# Patient Record
Sex: Male | Born: 1965
Health system: Southern US, Community
[De-identification: ages and names within clinical notes are randomized; demographics above are authoritative.]

## PROBLEM LIST (undated history)

## (undated) DIAGNOSIS — F32A Depression, unspecified: Secondary | ICD-10-CM

## (undated) DIAGNOSIS — E162 Hypoglycemia, unspecified: Secondary | ICD-10-CM

## (undated) DIAGNOSIS — F329 Major depressive disorder, single episode, unspecified: Secondary | ICD-10-CM

## (undated) DIAGNOSIS — I10 Essential (primary) hypertension: Secondary | ICD-10-CM

## (undated) DIAGNOSIS — B20 Human immunodeficiency virus [HIV] disease: Secondary | ICD-10-CM

## (undated) DIAGNOSIS — Z21 Asymptomatic human immunodeficiency virus [HIV] infection status: Secondary | ICD-10-CM

## (undated) DIAGNOSIS — F419 Anxiety disorder, unspecified: Secondary | ICD-10-CM

## (undated) DIAGNOSIS — J45909 Unspecified asthma, uncomplicated: Secondary | ICD-10-CM

## (undated) HISTORY — DX: Unspecified asthma, uncomplicated: J45.909

## (undated) HISTORY — PX: HERNIA REPAIR: SHX51

---

## 1994-12-11 DIAGNOSIS — J45909 Unspecified asthma, uncomplicated: Secondary | ICD-10-CM | POA: Insufficient documentation

## 2004-11-19 ENCOUNTER — Emergency Department (HOSPITAL_COMMUNITY): Admission: EM | Admit: 2004-11-19 | Discharge: 2004-11-19 | Payer: Self-pay | Admitting: Emergency Medicine

## 2005-05-01 ENCOUNTER — Encounter: Admission: RE | Admit: 2005-05-01 | Discharge: 2005-05-01 | Payer: Self-pay | Admitting: Infectious Diseases

## 2005-05-01 ENCOUNTER — Ambulatory Visit: Payer: Self-pay | Admitting: Infectious Diseases

## 2005-05-01 ENCOUNTER — Encounter (INDEPENDENT_AMBULATORY_CARE_PROVIDER_SITE_OTHER): Payer: Self-pay | Admitting: *Deleted

## 2005-05-01 LAB — CONVERTED CEMR LAB: CD4 T Cell Abs: 410

## 2005-06-03 ENCOUNTER — Encounter: Admission: RE | Admit: 2005-06-03 | Discharge: 2005-06-03 | Payer: Self-pay | Admitting: Infectious Diseases

## 2005-06-03 ENCOUNTER — Ambulatory Visit: Payer: Self-pay | Admitting: Infectious Diseases

## 2005-06-29 ENCOUNTER — Ambulatory Visit: Payer: Self-pay | Admitting: Infectious Diseases

## 2005-08-05 ENCOUNTER — Ambulatory Visit: Payer: Self-pay | Admitting: Internal Medicine

## 2005-08-26 ENCOUNTER — Encounter: Admission: RE | Admit: 2005-08-26 | Discharge: 2005-08-26 | Payer: Self-pay | Admitting: Infectious Diseases

## 2005-08-26 ENCOUNTER — Ambulatory Visit: Payer: Self-pay | Admitting: Infectious Diseases

## 2005-08-26 ENCOUNTER — Encounter (INDEPENDENT_AMBULATORY_CARE_PROVIDER_SITE_OTHER): Payer: Self-pay | Admitting: *Deleted

## 2005-08-26 LAB — CONVERTED CEMR LAB: CD4 Count: 440 microliters

## 2005-11-16 ENCOUNTER — Encounter: Admission: RE | Admit: 2005-11-16 | Discharge: 2005-11-16 | Payer: Self-pay | Admitting: Infectious Diseases

## 2005-11-16 ENCOUNTER — Ambulatory Visit: Payer: Self-pay | Admitting: Infectious Diseases

## 2005-11-16 ENCOUNTER — Encounter (INDEPENDENT_AMBULATORY_CARE_PROVIDER_SITE_OTHER): Payer: Self-pay | Admitting: *Deleted

## 2005-11-16 LAB — CONVERTED CEMR LAB
CD4 Count: 370 microliters
HIV 1 RNA Quant: 49 copies/mL
HIV 1 RNA Quant: <50 copies/mL (ref <50)

## 2005-11-19 DIAGNOSIS — K625 Hemorrhage of anus and rectum: Secondary | ICD-10-CM

## 2006-02-23 ENCOUNTER — Encounter: Admission: RE | Admit: 2006-02-23 | Discharge: 2006-02-23 | Payer: Self-pay | Admitting: Infectious Diseases

## 2006-02-23 ENCOUNTER — Ambulatory Visit: Payer: Self-pay | Admitting: Infectious Diseases

## 2006-02-23 ENCOUNTER — Encounter (INDEPENDENT_AMBULATORY_CARE_PROVIDER_SITE_OTHER): Payer: Self-pay | Admitting: *Deleted

## 2006-02-23 LAB — CONVERTED CEMR LAB
Albumin: 4.6 g/dL (ref 3.5–5.2)
CO2: 28 meq/L (ref 19–32)
Cholesterol: 182 mg/dL (ref 0–200)
Glucose, Bld: 71 mg/dL (ref 70–99)
HIV 1 RNA Quant: 49 copies/mL
HIV-1 RNA Quant, Log: <1.7 (ref <1.70)
LDL Cholesterol: 91 mg/dL (ref 0–99)
Potassium: 3.9 meq/L (ref 3.5–5.3)
RBC: 4.01 M/uL — ABNORMAL LOW (ref 4.22–5.81)
Sodium: 140 meq/L (ref 135–145)
Total Protein: 8.4 g/dL — ABNORMAL HIGH (ref 6.0–8.3)
Triglycerides: 281 mg/dL — ABNORMAL HIGH (ref <150)
WBC: 3.2 10*3/uL — ABNORMAL LOW (ref 4.0–10.5)

## 2006-02-24 DIAGNOSIS — E785 Hyperlipidemia, unspecified: Secondary | ICD-10-CM

## 2006-02-24 DIAGNOSIS — B20 Human immunodeficiency virus [HIV] disease: Secondary | ICD-10-CM | POA: Insufficient documentation

## 2006-02-24 DIAGNOSIS — Z8619 Personal history of other infectious and parasitic diseases: Secondary | ICD-10-CM

## 2006-03-08 ENCOUNTER — Encounter: Payer: Self-pay | Admitting: Infectious Diseases

## 2006-04-05 ENCOUNTER — Telehealth: Payer: Self-pay | Admitting: Infectious Diseases

## 2006-04-05 ENCOUNTER — Encounter (INDEPENDENT_AMBULATORY_CARE_PROVIDER_SITE_OTHER): Payer: Self-pay | Admitting: *Deleted

## 2006-04-05 LAB — CONVERTED CEMR LAB

## 2006-04-13 ENCOUNTER — Telehealth: Payer: Self-pay | Admitting: Infectious Diseases

## 2006-04-18 ENCOUNTER — Encounter (INDEPENDENT_AMBULATORY_CARE_PROVIDER_SITE_OTHER): Payer: Self-pay | Admitting: *Deleted

## 2006-04-20 ENCOUNTER — Encounter (INDEPENDENT_AMBULATORY_CARE_PROVIDER_SITE_OTHER): Payer: Self-pay | Admitting: *Deleted

## 2006-05-05 ENCOUNTER — Telehealth: Payer: Self-pay | Admitting: Infectious Diseases

## 2006-06-01 ENCOUNTER — Telehealth: Payer: Self-pay | Admitting: Infectious Diseases

## 2006-07-02 ENCOUNTER — Telehealth: Payer: Self-pay | Admitting: Infectious Diseases

## 2006-07-06 ENCOUNTER — Ambulatory Visit: Payer: Self-pay | Admitting: Infectious Diseases

## 2006-07-06 ENCOUNTER — Encounter: Admission: RE | Admit: 2006-07-06 | Discharge: 2006-07-06 | Payer: Self-pay | Admitting: Infectious Diseases

## 2006-07-06 DIAGNOSIS — R599 Enlarged lymph nodes, unspecified: Secondary | ICD-10-CM | POA: Insufficient documentation

## 2006-07-06 LAB — CONVERTED CEMR LAB
ALT: 12 units/L (ref 0–53)
AST: 13 units/L (ref 0–37)
CO2: 26 meq/L (ref 19–32)
Calcium: 9.4 mg/dL (ref 8.4–10.5)
Chloride: 105 meq/L (ref 96–112)
Cholesterol: 180 mg/dL (ref 0–200)
HIV 1 RNA Quant: 240 copies/mL — ABNORMAL HIGH (ref <50)
Leukocytes, UA: NEGATIVE
Nitrite: NEGATIVE
Platelets: 231 10*3/uL (ref 150–400)
Protein, ur: NEGATIVE mg/dL
RDW: 13.7 % (ref 11.5–14.0)
Sodium: 142 meq/L (ref 135–145)
Total Protein: 8.2 g/dL (ref 6.0–8.3)
Urine Glucose: NEGATIVE mg/dL
VLDL: 32 mg/dL (ref 0–40)
WBC, UA: NONE SEEN cells/hpf (ref <3)
WBC: 4 10*3/uL (ref 4.0–10.5)
pH: 6 (ref 5.0–8.0)

## 2006-07-30 ENCOUNTER — Telehealth: Payer: Self-pay | Admitting: Infectious Diseases

## 2006-08-31 ENCOUNTER — Telehealth: Payer: Self-pay | Admitting: Infectious Diseases

## 2006-09-02 ENCOUNTER — Telehealth: Payer: Self-pay | Admitting: Infectious Diseases

## 2006-09-02 ENCOUNTER — Encounter (INDEPENDENT_AMBULATORY_CARE_PROVIDER_SITE_OTHER): Payer: Self-pay | Admitting: *Deleted

## 2006-09-03 ENCOUNTER — Ambulatory Visit: Payer: Self-pay | Admitting: Internal Medicine

## 2006-09-03 LAB — CONVERTED CEMR LAB
Bilirubin Urine: NEGATIVE
CO2: 28 meq/L (ref 19–32)
Calcium: 9.8 mg/dL (ref 8.4–10.5)
Chloride: 102 meq/L (ref 96–112)
Glucose, Bld: 74 mg/dL (ref 70–99)
Glucose, Urine, Semiquant: NEGATIVE
Ketones, ur: NEGATIVE mg/dL
Nitrite: NEGATIVE
Potassium: 3.3 meq/L — ABNORMAL LOW (ref 3.5–5.3)
Protein, U semiquant: 30
RBC / HPF: NONE SEEN (ref <3)
Sodium: 141 meq/L (ref 135–145)
Specific Gravity, Urine: 1.019 (ref 1.005–1.03)
Urine Glucose: NEGATIVE mg/dL
Urobilinogen, UA: 0.2
pH: 5.5
pH: 6 (ref 5.0–8.0)

## 2006-09-04 ENCOUNTER — Encounter: Payer: Self-pay | Admitting: Internal Medicine

## 2006-09-13 ENCOUNTER — Encounter (INDEPENDENT_AMBULATORY_CARE_PROVIDER_SITE_OTHER): Payer: Self-pay | Admitting: *Deleted

## 2006-09-13 ENCOUNTER — Ambulatory Visit: Payer: Self-pay | Admitting: Infectious Diseases

## 2006-09-29 ENCOUNTER — Telehealth: Payer: Self-pay | Admitting: Infectious Diseases

## 2006-11-02 ENCOUNTER — Telehealth: Payer: Self-pay | Admitting: Infectious Diseases

## 2006-11-30 ENCOUNTER — Telehealth: Payer: Self-pay | Admitting: Infectious Diseases

## 2006-12-20 ENCOUNTER — Telehealth: Payer: Self-pay | Admitting: Infectious Diseases

## 2006-12-21 ENCOUNTER — Ambulatory Visit: Payer: Self-pay | Admitting: Internal Medicine

## 2006-12-21 DIAGNOSIS — J209 Acute bronchitis, unspecified: Secondary | ICD-10-CM

## 2006-12-27 ENCOUNTER — Encounter: Admission: RE | Admit: 2006-12-27 | Discharge: 2006-12-27 | Payer: Self-pay | Admitting: Infectious Diseases

## 2006-12-27 ENCOUNTER — Ambulatory Visit: Payer: Self-pay | Admitting: Infectious Diseases

## 2006-12-27 LAB — CONVERTED CEMR LAB
AST: 22 units/L (ref 0–37)
Albumin: 4.5 g/dL (ref 3.5–5.2)
Alkaline Phosphatase: 83 units/L (ref 39–117)
Calcium: 9.1 mg/dL (ref 8.4–10.5)
Chloride: 104 meq/L (ref 96–112)
Glucose, Bld: 63 mg/dL — ABNORMAL LOW (ref 70–99)
HIV-1 RNA Quant, Log: 2.05 — ABNORMAL HIGH (ref <1.70)
Hemoglobin: 13.2 g/dL (ref 13.0–17.0)
MCHC: 32 g/dL (ref 30.0–36.0)
Potassium: 3.8 meq/L (ref 3.5–5.3)
RBC: 4.25 M/uL (ref 4.22–5.81)
Sodium: 140 meq/L (ref 135–145)
Total Protein: 8 g/dL (ref 6.0–8.3)

## 2006-12-31 ENCOUNTER — Telehealth: Payer: Self-pay | Admitting: Infectious Diseases

## 2007-01-31 ENCOUNTER — Telehealth: Payer: Self-pay | Admitting: Infectious Diseases

## 2007-02-08 ENCOUNTER — Encounter: Payer: Self-pay | Admitting: Infectious Diseases

## 2007-02-09 ENCOUNTER — Encounter (INDEPENDENT_AMBULATORY_CARE_PROVIDER_SITE_OTHER): Payer: Self-pay | Admitting: *Deleted

## 2007-03-01 ENCOUNTER — Telehealth: Payer: Self-pay | Admitting: Infectious Diseases

## 2007-03-31 ENCOUNTER — Telehealth: Payer: Self-pay | Admitting: Infectious Diseases

## 2007-04-26 ENCOUNTER — Telehealth: Payer: Self-pay | Admitting: Infectious Diseases

## 2007-04-29 ENCOUNTER — Encounter (INDEPENDENT_AMBULATORY_CARE_PROVIDER_SITE_OTHER): Payer: Self-pay | Admitting: *Deleted

## 2007-05-12 ENCOUNTER — Telehealth: Payer: Self-pay | Admitting: Infectious Diseases

## 2007-05-13 ENCOUNTER — Ambulatory Visit: Payer: Self-pay | Admitting: Internal Medicine

## 2007-05-13 DIAGNOSIS — R42 Dizziness and giddiness: Secondary | ICD-10-CM

## 2007-05-17 ENCOUNTER — Ambulatory Visit: Payer: Self-pay | Admitting: Infectious Diseases

## 2007-05-17 ENCOUNTER — Encounter: Admission: RE | Admit: 2007-05-17 | Discharge: 2007-05-17 | Payer: Self-pay | Admitting: Infectious Diseases

## 2007-05-17 ENCOUNTER — Ambulatory Visit (HOSPITAL_COMMUNITY): Admission: RE | Admit: 2007-05-17 | Discharge: 2007-05-17 | Payer: Self-pay | Admitting: Internal Medicine

## 2007-05-17 ENCOUNTER — Telehealth (INDEPENDENT_AMBULATORY_CARE_PROVIDER_SITE_OTHER): Payer: Self-pay | Admitting: *Deleted

## 2007-05-17 LAB — CONVERTED CEMR LAB
ALT: 32 units/L (ref 0–53)
AST: 24 units/L (ref 0–37)
Alkaline Phosphatase: 76 units/L (ref 39–117)
Basophils Absolute: 0 10*3/uL (ref 0.0–0.1)
Eosinophils Absolute: 0.1 10*3/uL (ref 0.0–0.7)
Eosinophils Relative: 4 % (ref 0–5)
HCT: 36.3 % — ABNORMAL LOW (ref 39.0–52.0)
HIV 1 RNA Quant: 61 copies/mL — ABNORMAL HIGH (ref <50)
MCV: 95.5 fL (ref 78.0–100.0)
Neutrophils Relative %: 49 % (ref 43–77)
Platelets: 247 10*3/uL (ref 150–400)
RDW: 13.8 % (ref 11.5–15.5)
Sodium: 141 meq/L (ref 135–145)
Total Bilirubin: 0.4 mg/dL (ref 0.3–1.2)
Total Protein: 7.9 g/dL (ref 6.0–8.3)

## 2007-05-23 ENCOUNTER — Encounter (INDEPENDENT_AMBULATORY_CARE_PROVIDER_SITE_OTHER): Payer: Self-pay | Admitting: *Deleted

## 2007-05-24 ENCOUNTER — Telehealth (INDEPENDENT_AMBULATORY_CARE_PROVIDER_SITE_OTHER): Payer: Self-pay | Admitting: *Deleted

## 2007-06-01 ENCOUNTER — Ambulatory Visit: Payer: Self-pay | Admitting: Infectious Diseases

## 2007-06-01 DIAGNOSIS — F17200 Nicotine dependence, unspecified, uncomplicated: Secondary | ICD-10-CM | POA: Insufficient documentation

## 2007-06-27 ENCOUNTER — Telehealth (INDEPENDENT_AMBULATORY_CARE_PROVIDER_SITE_OTHER): Payer: Self-pay | Admitting: *Deleted

## 2007-07-25 ENCOUNTER — Telehealth (INDEPENDENT_AMBULATORY_CARE_PROVIDER_SITE_OTHER): Payer: Self-pay | Admitting: *Deleted

## 2007-08-24 ENCOUNTER — Telehealth (INDEPENDENT_AMBULATORY_CARE_PROVIDER_SITE_OTHER): Payer: Self-pay | Admitting: *Deleted

## 2007-09-19 ENCOUNTER — Ambulatory Visit: Payer: Self-pay | Admitting: Infectious Diseases

## 2007-09-21 ENCOUNTER — Telehealth (INDEPENDENT_AMBULATORY_CARE_PROVIDER_SITE_OTHER): Payer: Self-pay | Admitting: *Deleted

## 2007-10-24 ENCOUNTER — Telehealth (INDEPENDENT_AMBULATORY_CARE_PROVIDER_SITE_OTHER): Payer: Self-pay | Admitting: *Deleted

## 2007-11-17 ENCOUNTER — Telehealth (INDEPENDENT_AMBULATORY_CARE_PROVIDER_SITE_OTHER): Payer: Self-pay | Admitting: *Deleted

## 2007-12-14 ENCOUNTER — Ambulatory Visit: Payer: Self-pay | Admitting: Infectious Diseases

## 2007-12-14 DIAGNOSIS — K029 Dental caries, unspecified: Secondary | ICD-10-CM | POA: Insufficient documentation

## 2007-12-14 LAB — CONVERTED CEMR LAB
ALT: 19 units/L (ref 0–53)
Albumin: 4.9 g/dL (ref 3.5–5.2)
Alkaline Phosphatase: 76 units/L (ref 39–117)
Basophils Absolute: 0 10*3/uL (ref 0.0–0.1)
CO2: 23 meq/L (ref 19–32)
Eosinophils Absolute: 0.2 10*3/uL (ref 0.0–0.7)
Eosinophils Relative: 3 % (ref 0–5)
Glucose, Bld: 98 mg/dL (ref 70–99)
HCT: 39.7 % (ref 39.0–52.0)
HIV 1 RNA Quant: <48 copies/mL (ref <48)
HIV-1 RNA Quant, Log: <1.68 (ref <1.68)
Hemoglobin: 13.4 g/dL (ref 13.0–17.0)
LDL Cholesterol: 120 mg/dL — ABNORMAL HIGH (ref 0–99)
Lymphocytes Relative: 27 % (ref 12–46)
Lymphs Abs: 1.6 10*3/uL (ref 0.7–4.0)
MCV: 94.5 fL (ref 78.0–100.0)
Neutrophils Relative %: 63 % (ref 43–77)
Platelets: 258 10*3/uL (ref 150–400)
Potassium: 4 meq/L (ref 3.5–5.3)
RBC: 4.2 M/uL — ABNORMAL LOW (ref 4.22–5.81)
RDW: 14.3 % (ref 11.5–15.5)
Sodium: 140 meq/L (ref 135–145)
Total Bilirubin: 0.3 mg/dL (ref 0.3–1.2)
Total Protein: 8.3 g/dL (ref 6.0–8.3)
Triglycerides: 171 mg/dL — ABNORMAL HIGH (ref <150)
VLDL: 34 mg/dL (ref 0–40)

## 2007-12-22 ENCOUNTER — Telehealth (INDEPENDENT_AMBULATORY_CARE_PROVIDER_SITE_OTHER): Payer: Self-pay | Admitting: *Deleted

## 2008-01-04 ENCOUNTER — Telehealth: Payer: Self-pay | Admitting: Infectious Diseases

## 2008-01-18 ENCOUNTER — Telehealth (INDEPENDENT_AMBULATORY_CARE_PROVIDER_SITE_OTHER): Payer: Self-pay | Admitting: *Deleted

## 2008-02-15 ENCOUNTER — Telehealth (INDEPENDENT_AMBULATORY_CARE_PROVIDER_SITE_OTHER): Payer: Self-pay | Admitting: *Deleted

## 2008-03-14 ENCOUNTER — Telehealth (INDEPENDENT_AMBULATORY_CARE_PROVIDER_SITE_OTHER): Payer: Self-pay | Admitting: *Deleted

## 2008-03-20 ENCOUNTER — Telehealth: Payer: Self-pay | Admitting: Infectious Diseases

## 2008-04-11 ENCOUNTER — Telehealth (INDEPENDENT_AMBULATORY_CARE_PROVIDER_SITE_OTHER): Payer: Self-pay | Admitting: *Deleted

## 2008-04-23 ENCOUNTER — Telehealth: Payer: Self-pay

## 2008-04-30 ENCOUNTER — Ambulatory Visit: Payer: Self-pay | Admitting: Infectious Diseases

## 2008-04-30 DIAGNOSIS — G619 Inflammatory polyneuropathy, unspecified: Secondary | ICD-10-CM | POA: Insufficient documentation

## 2008-04-30 DIAGNOSIS — G622 Polyneuropathy due to other toxic agents: Secondary | ICD-10-CM

## 2008-04-30 LAB — CONVERTED CEMR LAB
ALT: 14 units/L (ref 0–53)
AST: 16 units/L (ref 0–37)
Alkaline Phosphatase: 74 units/L (ref 39–117)
Basophils Absolute: 0 10*3/uL (ref 0.0–0.1)
Basophils Relative: 1 % (ref 0–1)
CO2: 23 meq/L (ref 19–32)
Creatinine, Ser: 1.02 mg/dL (ref 0.40–1.50)
Hemoglobin: 14.4 g/dL (ref 13.0–17.0)
LDL Cholesterol: 115 mg/dL — ABNORMAL HIGH (ref 0–99)
Lymphocytes Relative: 21 % (ref 12–46)
MCHC: 34.8 g/dL (ref 30.0–36.0)
Monocytes Absolute: 0.3 10*3/uL (ref 0.1–1.0)
Neutro Abs: 3.3 10*3/uL (ref 1.7–7.7)
Neutrophils Relative %: 70 % (ref 43–77)
Platelets: 231 10*3/uL (ref 150–400)
RBC Folate: 337 ng/mL (ref 180–600)
RDW: 13.9 % (ref 11.5–15.5)
Sodium: 140 meq/L (ref 135–145)
TSH: 0.887 microintl units/mL (ref 0.350–4.500)
Total Bilirubin: 0.4 mg/dL (ref 0.3–1.2)
Total CHOL/HDL Ratio: 4.4
Total Protein: 8.2 g/dL (ref 6.0–8.3)
VLDL: 26 mg/dL (ref 0–40)
Vitamin B-12: 613 pg/mL (ref 211–911)

## 2008-05-09 ENCOUNTER — Telehealth (INDEPENDENT_AMBULATORY_CARE_PROVIDER_SITE_OTHER): Payer: Self-pay | Admitting: *Deleted

## 2008-05-09 ENCOUNTER — Ambulatory Visit (HOSPITAL_COMMUNITY): Admission: RE | Admit: 2008-05-09 | Discharge: 2008-05-09 | Payer: Self-pay | Admitting: Infectious Diseases

## 2008-05-22 ENCOUNTER — Encounter (INDEPENDENT_AMBULATORY_CARE_PROVIDER_SITE_OTHER): Payer: Self-pay | Admitting: *Deleted

## 2008-06-06 ENCOUNTER — Telehealth (INDEPENDENT_AMBULATORY_CARE_PROVIDER_SITE_OTHER): Payer: Self-pay | Admitting: *Deleted

## 2008-07-04 ENCOUNTER — Telehealth (INDEPENDENT_AMBULATORY_CARE_PROVIDER_SITE_OTHER): Payer: Self-pay | Admitting: *Deleted

## 2008-08-07 ENCOUNTER — Telehealth (INDEPENDENT_AMBULATORY_CARE_PROVIDER_SITE_OTHER): Payer: Self-pay | Admitting: *Deleted

## 2008-09-03 ENCOUNTER — Telehealth (INDEPENDENT_AMBULATORY_CARE_PROVIDER_SITE_OTHER): Payer: Self-pay | Admitting: *Deleted

## 2008-10-01 ENCOUNTER — Telehealth (INDEPENDENT_AMBULATORY_CARE_PROVIDER_SITE_OTHER): Payer: Self-pay | Admitting: *Deleted

## 2008-10-31 ENCOUNTER — Telehealth (INDEPENDENT_AMBULATORY_CARE_PROVIDER_SITE_OTHER): Payer: Self-pay | Admitting: *Deleted

## 2008-11-28 ENCOUNTER — Telehealth (INDEPENDENT_AMBULATORY_CARE_PROVIDER_SITE_OTHER): Payer: Self-pay | Admitting: *Deleted

## 2008-12-31 ENCOUNTER — Telehealth (INDEPENDENT_AMBULATORY_CARE_PROVIDER_SITE_OTHER): Payer: Self-pay | Admitting: *Deleted

## 2009-02-07 ENCOUNTER — Telehealth (INDEPENDENT_AMBULATORY_CARE_PROVIDER_SITE_OTHER): Payer: Self-pay | Admitting: *Deleted

## 2009-02-25 ENCOUNTER — Telehealth (INDEPENDENT_AMBULATORY_CARE_PROVIDER_SITE_OTHER): Payer: Self-pay | Admitting: *Deleted

## 2009-03-26 ENCOUNTER — Telehealth (INDEPENDENT_AMBULATORY_CARE_PROVIDER_SITE_OTHER): Payer: Self-pay | Admitting: *Deleted

## 2009-03-28 ENCOUNTER — Ambulatory Visit: Payer: Self-pay | Admitting: Infectious Diseases

## 2009-03-28 ENCOUNTER — Telehealth: Payer: Self-pay

## 2009-03-28 DIAGNOSIS — K439 Ventral hernia without obstruction or gangrene: Secondary | ICD-10-CM

## 2009-03-28 LAB — CONVERTED CEMR LAB
Basophils Absolute: 0 10*3/uL (ref 0.0–0.1)
Basophils Relative: 0 % (ref 0–1)
CO2: 24 meq/L (ref 19–32)
Cholesterol: 158 mg/dL (ref 0–200)
Creatinine, Ser: 0.91 mg/dL (ref 0.40–1.50)
Eosinophils Absolute: 0.1 10*3/uL (ref 0.0–0.7)
Glucose, Bld: 85 mg/dL (ref 70–99)
HDL: 41 mg/dL (ref >39)
Hemoglobin: 13.1 g/dL (ref 13.0–17.0)
MCHC: 33.9 g/dL (ref 30.0–36.0)
MCV: 93.7 fL (ref >=78.0)
Monocytes Absolute: 0.3 10*3/uL (ref 0.1–1.0)
Monocytes Relative: 5 % (ref 3–12)
Neutrophils Relative %: 72 % (ref 43–77)
RBC: 4.12 M/uL — ABNORMAL LOW (ref 4.22–5.81)
RDW: 13.2 % (ref 11.5–15.5)
Total Bilirubin: 0.5 mg/dL (ref 0.3–1.2)
Total CHOL/HDL Ratio: 3.9
Triglycerides: 133 mg/dL (ref <150)
VLDL: 27 mg/dL (ref 0–40)

## 2009-04-22 ENCOUNTER — Telehealth (INDEPENDENT_AMBULATORY_CARE_PROVIDER_SITE_OTHER): Payer: Self-pay | Admitting: *Deleted

## 2009-05-24 ENCOUNTER — Encounter (INDEPENDENT_AMBULATORY_CARE_PROVIDER_SITE_OTHER): Payer: Self-pay | Admitting: *Deleted

## 2009-05-24 ENCOUNTER — Encounter: Payer: Self-pay | Admitting: Infectious Diseases

## 2009-06-06 ENCOUNTER — Telehealth: Payer: Self-pay | Admitting: Infectious Diseases

## 2009-06-17 ENCOUNTER — Telehealth (INDEPENDENT_AMBULATORY_CARE_PROVIDER_SITE_OTHER): Payer: Self-pay | Admitting: *Deleted

## 2009-06-18 ENCOUNTER — Encounter (INDEPENDENT_AMBULATORY_CARE_PROVIDER_SITE_OTHER): Payer: Self-pay | Admitting: *Deleted

## 2009-07-02 ENCOUNTER — Ambulatory Visit: Payer: Self-pay | Admitting: Infectious Diseases

## 2009-07-02 LAB — CONVERTED CEMR LAB
Albumin: 4.5 g/dL (ref 3.5–5.2)
Alkaline Phosphatase: 75 units/L (ref 39–117)
BUN: 9 mg/dL (ref 6–23)
Basophils Absolute: 0 10*3/uL (ref 0.0–0.1)
Basophils Relative: 0 % (ref 0–1)
Eosinophils Relative: 5 % (ref 0–5)
Glucose, Bld: 108 mg/dL — ABNORMAL HIGH (ref 70–99)
HCT: 37.9 % — ABNORMAL LOW (ref 39.0–52.0)
HDL: 37 mg/dL — ABNORMAL LOW (ref >39)
Hemoglobin: 12.5 g/dL — ABNORMAL LOW (ref 13.0–17.0)
LDL Cholesterol: 91 mg/dL (ref 0–99)
Lymphocytes Relative: 47 % — ABNORMAL HIGH (ref 12–46)
MCHC: 33 g/dL (ref 30.0–36.0)
MCV: 94 fL (ref 78.0–100.0)
Monocytes Absolute: 0.2 10*3/uL (ref 0.1–1.0)
Potassium: 3.7 meq/L (ref 3.5–5.3)
RDW: 14.3 % (ref 11.5–15.5)
Triglycerides: 263 mg/dL — ABNORMAL HIGH (ref <150)

## 2009-07-24 ENCOUNTER — Ambulatory Visit: Payer: Self-pay | Admitting: Infectious Diseases

## 2009-08-15 ENCOUNTER — Telehealth: Payer: Self-pay | Admitting: Infectious Diseases

## 2009-08-27 ENCOUNTER — Encounter: Payer: Self-pay | Admitting: Infectious Diseases

## 2009-11-25 ENCOUNTER — Ambulatory Visit: Payer: Self-pay | Admitting: Infectious Diseases

## 2009-11-25 LAB — CONVERTED CEMR LAB
ALT: 17 units/L (ref 0–53)
Albumin: 3.9 g/dL (ref 3.5–5.2)
Basophils Absolute: 0 10*3/uL (ref 0.0–0.1)
CO2: 23 meq/L (ref 19–32)
Calcium: 9.1 mg/dL (ref 8.4–10.5)
Chloride: 105 meq/L (ref 96–112)
HIV 1 RNA Quant: 48 copies/mL — ABNORMAL HIGH (ref <20)
Hemoglobin: 12.9 g/dL — ABNORMAL LOW (ref 13.0–17.0)
Lymphocytes Relative: 54 % — ABNORMAL HIGH (ref 12–46)
Monocytes Absolute: 0.3 10*3/uL (ref 0.1–1.0)
Neutro Abs: 1.4 10*3/uL — ABNORMAL LOW (ref 1.7–7.7)
Neutrophils Relative %: 34 % — ABNORMAL LOW (ref 43–77)
Platelets: 256 10*3/uL (ref 150–400)
Potassium: 3.5 meq/L (ref 3.5–5.3)
RDW: 14.3 % (ref 11.5–15.5)
Sodium: 141 meq/L (ref 135–145)
Total Protein: 7.2 g/dL (ref 6.0–8.3)

## 2009-12-09 ENCOUNTER — Encounter: Payer: Self-pay | Admitting: Infectious Diseases

## 2009-12-12 ENCOUNTER — Ambulatory Visit: Payer: Self-pay | Admitting: Infectious Diseases

## 2009-12-12 DIAGNOSIS — I10 Essential (primary) hypertension: Secondary | ICD-10-CM | POA: Insufficient documentation

## 2009-12-26 ENCOUNTER — Encounter: Payer: Self-pay | Admitting: Infectious Diseases

## 2009-12-26 ENCOUNTER — Ambulatory Visit: Payer: Self-pay | Admitting: Infectious Diseases

## 2010-01-07 ENCOUNTER — Ambulatory Visit: Payer: Self-pay | Admitting: Adult Health

## 2010-01-07 DIAGNOSIS — A630 Anogenital (venereal) warts: Secondary | ICD-10-CM

## 2010-01-22 ENCOUNTER — Encounter: Payer: Self-pay | Admitting: Adult Health

## 2010-01-27 ENCOUNTER — Telehealth: Payer: Self-pay | Admitting: Adult Health

## 2010-02-05 ENCOUNTER — Encounter: Payer: Self-pay | Admitting: Infectious Diseases

## 2010-03-02 ENCOUNTER — Encounter: Payer: Self-pay | Admitting: Infectious Diseases

## 2010-03-11 NOTE — Progress Notes (Signed)
Summary: Abdomen pain   Phone Note Call from Patient   Summary of Call: abdominal  pain with diarrhea that is causing pt's stomach to"ball up in a knott". OV offered as work in for today @ 2pm.   Pt will come for eval. Tomasita Morrow RN  March 28, 2009 11:46 AM  Initial call taken by: Tomasita Morrow RN,  March 28, 2009 11:46 AM

## 2010-03-11 NOTE — Assessment & Plan Note (Signed)
Summary: BP check[mkj]  Prior Medications: ATRIPLA 600-200-300 MG TABS (EFAVIRENZ-EMTRICITAB-TENOFOVIR) one by mouth every night ALTACE 2.5 MG CAP (RAMIPRIL) Take 1 capsule by mouth once a day Current Allergies: ! * BEE STING

## 2010-03-11 NOTE — Progress Notes (Signed)
Summary: [place]/pt assist med arrived for Jan  Phone Note Refill Request      Prescriptions: ATRIPLA 600-200-300 MG TABS (EFAVIRENZ-EMTRICITAB-TENOFOVIR) one by mouth every night  #30 x 0   Entered by:   Paulo Fruit  BS,CPht II,MPH   Authorized by:   Johny Sax MD   Signed by:   Paulo Fruit  BS,CPht II,MPH on 02/25/2009   Method used:   Samples Given   RxID:   [id number]   Patient Assist Medication Verification: Medication:Atripla Lot# [id number] Exp Date:06 2013 Tech approval:MLD Call placed to patient with message that assistance medications are ready for pick-up. Paulo Fruit  BS,CPht II,MPH  February 25, 2009 9:43 AM                  Appended Document: [place]/pt assist med arrived for Jan Prescription/Samples picked up by: patient

## 2010-03-11 NOTE — Miscellaneous (Signed)
Summary: clinical update/ryan white NC[employer]AP apprv  til 05/10/10  Clinical Lists Changes  Observations: Added new observation of AIDSDAP: Yes 2011 (06/18/2009 12:37)

## 2010-03-11 NOTE — Progress Notes (Signed)
Summary: abdominal pain/TY  Phone Note Call from Patient   Caller: Patient Call For: Johny Sax MD Summary of Call: Patient called stating that he had abdominal pain, with  rectal bleeding and nausea. I advised him to go to the ER, then he decided that his symptoms was not "that bad". He inquired about his gastro referral, and I stated that without insurance I could possibly get him an appointment at [facility]. He stated he would consider that, and he also received the number to call [facility] for medical case management.  Initial call taken by: Starleen Arms CMA,  August 15, 2009 2:48 PM

## 2010-03-11 NOTE — Progress Notes (Signed)
Summary: [place]/pt assist med arrived for May  Phone Note Refill Request      Prescriptions: ATRIPLA 600-200-300 MG TABS (EFAVIRENZ-EMTRICITAB-TENOFOVIR) one by mouth every night  #30 x 0   Entered by:   Paulo Fruit  BS,CPht II,MPH   Authorized by:   Johny Sax MD   Signed by:   Paulo Fruit  BS,CPht II,MPH on 06/17/2009   Method used:   Samples Given   RxID:   [id number]   Patient Assist Medication Verification: Medication: Atripla Lot# [id number] Exp Date:08 2013 Tech approval:MLD Call placed to patient with message that assistance medications are ready for pick-up. Paulo Fruit  BS,CPht II,MPH  Jun 17, 2009 10:03 AM

## 2010-03-11 NOTE — Assessment & Plan Note (Signed)
Summary: F/U [MKJ]   CC:  follow-up visit.  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 Has been taking atripla. Last CD4 550 and VL <48, Trig 263 (07-02-09). at prevvisit, had difficulty with pain in arms. 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 No problems with meds. His arm pain has improved with getting new mattresses. Eating well, wt up 3#. moving bowels well, nl urination. c/o feeling sleepy all the time. goes to bed at 10:30-11pm and up at 8am. having daytime drowsiness. taking mid-day naps.   Preventive Screening-Counseling & Management  Alcohol-Tobacco     Alcohol drinks/day: 0     Smoking Status: current     Smoking Cessation Counseling: yes     Packs/Day: <0.25     Year Started: AT THE AGE OF 22  Caffeine-Diet-Exercise     Caffeine use/day: 1 soda a day     Does Patient Exercise: yes     Type of exercise: run, home work out     Exercise (avg: min/session): 30-60     Times/week: 3  Safety-Violence-Falls     Seat Belt Use: yes   Updated Prior Medication List: ATRIPLA 600-200-300 MG TABS (EFAVIRENZ-EMTRICITAB-TENOFOVIR) one by mouth every night  Current Allergies (reviewed today): ! * BEE STING Past History:  Past medical, surgical, family and social histories (including risk factors) reviewed, and no changes noted (except as noted below).  Past Medical History: Reviewed history from 11/19/2005 and no changes required. HIV disease 12/11/1994 Hepatitis B, hx of Hyperlipidemia Asthma  Family History: Reviewed history from 06/01/2007 and no changes required. Family History Diabetes 1st degree relative, father and mother Family History Hypertension-mother  Social History: Reviewed history from 03/28/2009 and no changes required. less than 1/2 ppd Current Smoker Alcohol use-no  Vital Signs:  Patient profile:   45 year old male Height:      67 inches (170.18 cm) Weight:      144.8  pounds (65.82 kg) BMI:     22.76 Temp:     98.1 degrees F (36.72 degrees C) oral Pulse rate:   66 / minute BP sitting:   143 / 88  (left arm)  Vitals Entered By: Baxter Hire) (July 24, 2009 9:41 AM) CC: follow-up visit Pain Assessment Patient in pain? no      Nutritional Status Detail appetite is good per patient  Have you ever been in a relationship where you felt threatened, hurt or afraid?No   Does patient need assistance? Functional Status Self care Ambulation Normal   Physical Exam  General:  well-developed, well-nourished, and well-hydrated.   Eyes:  pupils equal, pupils round, and pupils reactive to light.   Mouth:  pharynx pink and moist, no exudates, poor dentition, and teeth missing.   Neck:  no masses.   Lungs:  normal respiratory effort and normal breath sounds.   Heart:  normal rate, regular rhythm, and no murmur.   Abdomen:  soft, non-tender, and normal bowel sounds.          Medication Adherence: 07/24/2009   Adherence to medications reviewed with patient. Counseling to provide adequate adherence provided   Prevention For Positives: 07/24/2009   Safe sex practices discussed with patient. Condoms offered.                             Impression & Recommendations:  Problem # 1:  HIV DISEASE (ICD-042)  he is doing very well. will cont his atripla. try to get him into dental. offered condoms. return to clinic 4-5 months.   Orders: Gastroenterology Referral (GI)  Problem # 2:  TOBACCO USER (ICD-305.1) counsled at length to qut smoking.   Problem # 3:  HEPATITIS B, HX OF (ICD-V12.09) will cont to f/u his LFTS, he is on 2 active agents. it appears that he has S Ab only which would not be consistent with Hep B.   Problem # 4:  RECTAL BLEEDING, HX OF (ICD-V12.79)  has still, occasionally. will have him seen by GI.   Orders: Gastroenterology Referral (GI)  Problem # 5:  HYPERLIPIDEMIA (ICD-272.4) counseled to watch diet. stop smoking.     Other Orders: Est. Patient Level IV (16109) Future Orders: T-CD4SP ([facility]) (CD4SP) ... 10/22/2009 T-HIV Viral Load [phone]) ... 10/22/2009 T-Comprehensive Metabolic Panel [phone]) ... 10/22/2009 T-CBC w/Diff ([id number]) ... 10/22/2009

## 2010-03-11 NOTE — Assessment & Plan Note (Signed)
Summary: Nurse Visit (Infectious Disease)    Vital Signs:  Patient profile:   45 year old male Pulse rate:   65 / minute BP sitting:   150 / 92  (left arm)  Vitals Entered By: Starleen Arms CMA (December 26, 2009 9:41 AM) Prior Medications: ATRIPLA 600-200-300 MG TABS (EFAVIRENZ-EMTRICITAB-TENOFOVIR) one by mouth every night ALTACE 2.5 MG CAP (RAMIPRIL) Take 1 capsule by mouth once a day Current Allergies: ! * BEE STING

## 2010-03-11 NOTE — Miscellaneous (Signed)
Summary: [facility]   [facility]   Imported By: Florinda Marker 08/28/2009 11:37:37  _____________________________________________________________________  External Attachment:    Type:   Image     Comment:   External Document

## 2010-03-11 NOTE — Miscellaneous (Signed)
Summary: [facility]  [facility]   Imported By: Florinda Marker 12/10/2009 09:41:15  _____________________________________________________________________  External Attachment:    Type:   Image     Comment:   External Document

## 2010-03-11 NOTE — Progress Notes (Signed)
Summary: [place] med arrived for Mar  Phone Note Refill Request      Prescriptions: ATRIPLA 600-200-300 MG TABS (EFAVIRENZ-EMTRICITAB-TENOFOVIR) one by mouth every night  #30 x 0   Entered by:   Paulo Fruit  BS,CPht II,MPH   Authorized by:   Johny Sax MD   Signed by:   Paulo Fruit  BS,CPht II,MPH on 04/22/2009   Method used:   Samples Given   RxID:   [id number]   Patient Assist Medication Verification: Medication: Atripla Lot# [id number] Exp Date:07 2013 Tech approval:MLD Patient picked up last months meds last week.  Paulo Fruit  BS,CPht II,MPH  April 22, 2009 4:02 PM                  Appended Document: [place]/pt assist med arrived for Mar Prescription/Samples picked up by: patient

## 2010-03-11 NOTE — Progress Notes (Signed)
Summary: [place]/pt assist meds arrived for Feb  Phone Note Refill Request      Prescriptions: ATRIPLA 600-200-300 MG TABS (EFAVIRENZ-EMTRICITAB-TENOFOVIR) one by mouth every night  #30 x 0   Entered by:   Paulo Fruit  BS,CPht II,MPH   Authorized by:   Johny Sax MD   Signed by:   Paulo Fruit  BS,CPht II,MPH on 03/26/2009   Method used:   Samples Given   RxID:   [id number]   Patient Assist Medication Verification: Medication: Atripla Lot# [id number] Exp Date:06 2012 Tech approval:MLD Call placed to patient with message that assistance medications are ready for pick-up. Left message Paulo Fruit  BS,CPht II,MPH  March 26, 2009 4:40 PM

## 2010-03-11 NOTE — Assessment & Plan Note (Signed)
Summary: 2:00 work in [place] pain/tkk   CC:  abdominal pain X 2 days and feels like a knott is inside his belly  "tight"  with diarrhea (clear liquid) .  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 Has been taking atripla. Last CD4 390 and VL <48 (3-10). at marchvisit, had difficulty with pain in arms. 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 no problems with ART.  today c/o pain in stomach. has been up 4-8:30 am. Has noted defect in abd wall. no fever or chills. having diarrhea for last 24 hours.  had uri 1 week ago.   Preventive Screening-Counseling & Management  Alcohol-Tobacco     Alcohol drinks/day: 0     Smoking Status: current     Smoking Cessation Counseling: yes     Packs/Day: <0.25     Year Started: AT THE AGE OF 22   Updated Prior Medication List: ATRIPLA 600-200-300 MG TABS (EFAVIRENZ-EMTRICITAB-TENOFOVIR) one by mouth every night  Current Allergies (reviewed today): No known allergies  Past History:  Past medical, surgical, family and social histories (including risk factors) reviewed, and no changes noted (except as noted below).  Past Medical History: Reviewed history from 11/19/2005 and no changes required. HIV disease 12/11/1994 Hepatitis B, hx of Hyperlipidemia Asthma  Current Medications (verified): 1)  Atripla 600-200-300 Mg Tabs (Efavirenz-Emtricitab-Tenofovir) .... One By Mouth Every Night  Allergies (verified): No Known Drug Allergies   Family History: Reviewed history from 06/01/2007 and no changes required. Family History Diabetes 1st degree relative, father and mother Family History Hypertension-mother  Social History: Reviewed history from 11/19/2005 and no changes required. less than 1/2 ppd Current Smoker Alcohol use-no  Review of Systems       has quit date for smoking. only 3 ciagrettes/day, only some days. has been having blood in stool when bending or  lifting. helped relative move recently.   Vital Signs:  Patient profile:   45 year old male Height:      67 inches Weight:      141.0 pounds BMI:     22.16 BSA:     1.74 Temp:     99.1 degrees F oral Pulse rate:   69 / minute BP sitting:   150 / 87  (left arm)  Vitals Entered By: Tomasita Morrow RN (March 28, 2009 2:12 PM) CC: abdominal pain X 2 days,  feels like a knott is inside his belly  "tight"  with diarrhea (clear liquid)  Is Patient Diabetic? No Pain Assessment Patient in pain? yes     Location: abdomen Intensity: 10 Onset of pain  X 2 days  Nutritional Status BMI of 19 -24 = normal Nutritional Status Detail normal/diarrhea  Have you ever been in a relationship where you felt threatened, hurt or afraid?No  Domestic Violence Intervention none  Does patient need assistance? Functional Status Self care Ambulation Normal        Medication Adherence: 03/28/2009   Adherence to medications reviewed with patient. Counseling to provide adequate adherence provided   Prevention For Positives: 03/28/2009   Safe sex practices discussed with patient. Condoms offered.                             Physical Exam  General:  well-developed, well-nourished, and well-hydrated.   Eyes:  pupils equal, pupils round, and pupils reactive to light.   Mouth:  pharynx pink  and moist and no exudates.   Neck:  no masses.   Lungs:  normal respiratory effort and normal breath sounds.   Heart:  normal rate, regular rhythm, and no murmur.   Abdomen:  soft, non-tender, normal bowel sounds, no distention, no guarding, no rigidity, and no rebound tenderness.   he has RUQ, midline defect visible and palpable when he sits up. he has some vague discomfort int his area.    Impression & Recommendations:  Problem # 1:  HIV DISEASE (ICD-042)  doing fairly well. will get flu shot. recheck his labs.   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 return to clinic 1-2 weeks or to ED if worsening.   Orders: CT with Contrast (CT w/ contrast)  Problem # 3:  TOBACCO USER (ICD-305.1) has set quit date. is encouraged.   Problem # 4:  HYPERLIPIDEMIA (ICD-272.4) will f/u his lipids today.   Other Orders: Est. Patient Level IV [phone]) T-CD4SP ([facility]) (CD4SP) T-HIV Viral Load [phone]) T-Comprehensive Metabolic Panel [phone]) T-CBC w/Diff [phone]) T-Lipid Profile [phone]) T-RPR (Syphilis) [phone])  Process Orders Check Orders Results:     [facility]: ABN not required for this insurance Tests Sent for requisitioning (March 28, 2009 3:17 PM):     03/28/2009: [facility] -- T-HIV Viral Load [phone] (signed)     03/28/2009: [facility] -- T-Comprehensive Metabolic Panel [[id number]] (signed)     03/28/2009: [facility] -- T-CBC w/Diff [[id number]] (signed)     03/28/2009: [facility] -- T-Lipid Profile [phone] (signed)     03/28/2009: [facility] -- T-RPR (Syphilis) [phone] (signed)   Appended Document: 2:00 work in [employer] pain/tkk   Influenza Vaccine    Vaccine Type: Fluvax Non-MCR    Site: right deltoid    Mfr: Novartis    Dose: 0.5 ml    Route: IM    Given by: Baxter Hire)    Exp. Date: 05/10/2009    Lot #: [id number]    VIS given: 09/02/06 version given March 28, 2009.  Flu Vaccine Consent Questions    Do you have a history of severe allergic reactions to this vaccine? no    Any prior history of allergic reactions to egg and/or gelatin? no    Do you have a sensitivity to the preservative Thimersol? no    Do you have a past history of Guillan-Barre Syndrome? no    Do you currently have an acute febrile illness? no    Have you ever had a severe reaction to latex? no    Vaccine information given and  explained to patient? yes

## 2010-03-11 NOTE — Progress Notes (Signed)
Summary: sinus cong.  Phone Note Call from Patient   Caller: Patient Summary of Call: Pt has c/o sinsu congestion and yellow  productive cough X 2 days . Fever for 2 days with sweats., but is w/o fever today. Pt is requesting medications. Tomasita Morrow RN  June 06, 2009 12:34 PM   Follow-up for Phone Call        pt was advised to try Mucinex DM otc.  If his symptoms are not relieved w/i 2 days or worsen  he is to call the office for OV. Tomasita Morrow RN  June 06, 2009 2:44 PM

## 2010-03-11 NOTE — Miscellaneous (Signed)
Summary: clinical update/ryan white NCADAP app completed  Clinical Lists Changes  Observations: Added new observation of FINASSESSDT: 05/24/2009 (05/24/2009 9:52)     Paulo Fruit  BS,CPht II,MPH  May 24, 2009 9:55 AM

## 2010-03-11 NOTE — Letter (Signed)
Summary: Juanell Fairly: Verification Of Income  Ryan White: Verification Of Income   Imported By: Florinda Marker 05/27/2009 15:43:41  _____________________________________________________________________  External Attachment:    Type:   Image     Comment:   External Document

## 2010-03-11 NOTE — Miscellaneous (Signed)
Summary: Orders Update  Clinical Lists Changes  Orders: Added new Test order of T-CBC w/Diff [phone]) - Signed Added new Test order of T-CD4SP [facility]) (CD4SP) - Signed Added new Test order of T-HIV Viral Load [phone]) - Signed Added new Test order of T-Comprehensive Metabolic Panel [phone]) - Signed Added new Test order of T-Lipid Profile ([id number]) - Signed Added new Test order of T-RPR (Syphilis) ([id number]) - Signed Added new Test order of T-TSH ([id number]) - Signed

## 2010-03-11 NOTE — Assessment & Plan Note (Signed)
Summary: area of concern in rectal area.   CC:  pt. c/o painful tear at anus.  History of Present Illness: c/o "bump" in rectal area, noticeable for past 3 weeks.  Some irritation and pain when wiping area.  Also noticing some bleeding on tissue paper.  Denies BRBPR or other symptoms.   Preventive Screening-Counseling & Management  Alcohol-Tobacco     Alcohol drinks/day: 0     Smoking Status: current     Smoking Cessation Counseling: yes     Packs/Day: <0.25     Year Started: AT THE AGE OF 22  Caffeine-Diet-Exercise     Caffeine use/day: 1 soda a day     Does Patient Exercise: yes     Type of exercise: run, home work out     Exercise (avg: min/session): 30-60     Times/week: 3  Hep-HIV-STD-Contraception     HIV Risk: no     HIV Risk Counseling: 08/26/2005  Safety-Violence-Falls     Seat Belt Use: yes      Sexual History:  currently monogamous.        Drug Use:  current, marijuana, and once a day and sometimes twice and usually before bed time.    Comments: pt. declined condoms   Current Allergies (reviewed today): ! * BEE STING Social History: Drug Use:  current, marijuana, once a day and sometimes twice and usually before bed time Sexual History:  currently monogamous  Review of Systems  The patient denies anorexia, fever, weight loss, weight gain, vision loss, decreased hearing, hoarseness, chest pain, syncope, dyspnea on exertion, peripheral edema, prolonged cough, headaches, hemoptysis, abdominal pain, melena, hematochezia, severe indigestion/heartburn, hematuria, incontinence, genital sores, muscle weakness, suspicious skin lesions, transient blindness, difficulty walking, depression, unusual weight change, abnormal bleeding, enlarged lymph nodes, angioedema, breast masses, and testicular masses.    Vital Signs:  Patient profile:   45 year old male Height:      67 inches (170.18 cm) Weight:      142.4 pounds (64.73 kg) BMI:     22.38 Temp:     98.2 degrees F  (36.78 degrees C) oral Pulse rate:   67 / minute BP sitting:   145 / 80  (right arm)  Vitals Entered By: Wendall Mola CMA Duncan Dull) (January 07, 2010 10:35 AM) CC: pt. c/o painful tear at anus Is Patient Diabetic? No Pain Assessment Patient in pain? no      Nutritional Status BMI of 19 -24 = normal Nutritional Status Detail appetite "good"  Have you ever been in a relationship where you felt threatened, hurt or afraid?No   Does patient need assistance? Functional Status Self care Ambulation Normal Comments no missed doses of meds per pt.   Physical Exam  General:  Well-developed,well-nourished,in no acute distress; alert,appropriate and cooperative throughout examination Rectal:  anal wart, 2mm round firm, slightly friable located at anal sphincter Genitalia:  Testes bilaterally descended without nodularity, tenderness or masses. No scrotal masses or lesions. No penis lesions or urethral discharge.    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  To be applied at HS and washed off in a.m. If no improvement may need to refer for excision or cryo. Safer sex practices reviewed. Advised him his partner may need evaluation for HPV infection Should consider anal cytology specimen collection after treatment. Counselled extensively on HPV infection and uncertainty of timeline between exposure and lesion development. Orders: Est. Patient Level III (91478)  Medications Added to Medication List This Visit: 1)  Imiquimod 5 % Crea (Imiquimod) .... Apply to affected area every m-w-f at bedtime.  wash off in a.m. Prescriptions: IMIQUIMOD 5 % CREA (IMIQUIMOD) Apply to affected area every M-W-F at bedtime.  Wash off in a.m.  #QS x 2   Entered and Authorized by:   Talmadge Chad NP   Signed by:   Talmadge Chad NP on 01/07/2010   Method used:   Print then Give to Patient   RxID:   [id number]

## 2010-03-11 NOTE — Assessment & Plan Note (Signed)
Summary: f/u [mkj]   CC:  f/u and frequent headaches x 3weeks.  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 Has been taking atripla. Last CD4 630 and VL 48 (11-25-09), Trig 263 (07-02-09). at prev visit, had difficulty with pain in arms. 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 Continues to have sharp pain in his L hand.  complains of headache. temporal, intermittent, for last 3 weeks. Occas spots in vision with headache. No change in light perception. No change in smell. Relieved with laying down or tylenol.  taking otc benadryl and otc nausea medicine from [employer].  Has abd wall hernia that is easily reducible, occasionally painful. worse with greasy food.    Preventive Screening-Counseling & Management  Alcohol-Tobacco     Alcohol drinks/day: 0     Smoking Status: current     Smoking Cessation Counseling: yes     Packs/Day: <0.25     Year Started: AT THE AGE OF 22   Updated Prior Medication List: ATRIPLA 600-200-300 MG TABS (EFAVIRENZ-EMTRICITAB-TENOFOVIR) one by mouth every night  Current Allergies (reviewed today): ! * BEE STING Past History:  Past Medical History: HIV disease 12/11/1994 Hepatitis B, hx of Hyperlipidemia Asthma Hypertension  Vital Signs:  Patient profile:   45 year old male Height:      67 inches (170.18 cm) Weight:      144.75 pounds (65.80 kg) BMI:     22.75 Temp:     98.1 degrees F (36.72 degrees C) oral Pulse rate:   61 / minute BP sitting:   165 / 97  (left arm)  Vitals Entered By: Starleen Arms CMA (December 12, 2009 9:46 AM) CC: f/u, frequent headaches x 3weeks Is Patient Diabetic? No Pain Assessment Patient in pain? no      Nutritional Status BMI of 19 -24 = normal Nutritional Status Detail nl  Does patient need assistance? Functional Status Self care Ambulation Normal   Physical Exam  General:  well-developed, well-nourished, and well-hydrated.     Eyes:  pupils equal, pupils round, and pupils reactive to light.   Mouth:  pharynx pink and moist, no exudates, and poor dentition.   Neck:  no masses.   Lungs:  normal respiratory effort and normal breath sounds.   Heart:  normal rate, regular rhythm, and no murmur.   Abdomen:  soft, non-tender, and normal bowel sounds.  easily reducible hernia on L midline.         Medication Adherence: 12/12/2009   Adherence to medications reviewed with patient. Counseling to provide adequate adherence provided   Prevention For Positives: 12/12/2009   Safe sex practices discussed with patient. Condoms offered.                             Impression & Recommendations:  Problem # 1:  HIV DISEASE (ICD-042)  doing well with ART. his other issues are starting to predominate. will send him to neurosurgery for his arm tingling. flu shot today. offered condoms. return to clinic 3-4 months  Orders: Neurosurgeon Referral Psychologist, educational) Surgical Referral (Surgery)  Problem # 2:  DENTAL CARIES (ICD-521.00) refer to dental.   Problem # 3:  TOBACCO USER (ICD-305.1) encouraged to quit smoking  Problem # 4:  HYPERTENSION (ICD-401.9)  start low dose ACE-I, return to clinic 2 weeks for BP check.   His updated medication list for this problem includes:  Altace 2.5 Mg Cap (Ramipril) .Marland Kitchen... Take 1 capsule by mouth once a day  Medications Added to Medication List This Visit: 1)  Altace 2.5 Mg Cap (Ramipril) .... Take 1 capsule by mouth once a day  Other Orders: Est. Patient Level IV (16109) Future Orders: T-CD4SP ([facility]) (CD4SP) ... 03/12/2010 T-HIV Viral Load [phone]) ... 03/12/2010 T-CBC w/Diff ([id number]) ... 03/12/2010 T-RPR (Syphilis) [phone]) ... 03/12/2010 T-Lipid Profile [phone]) ... 03/12/2010  Prescriptions: ALTACE 2.5 MG CAP (RAMIPRIL) Take 1 capsule by mouth once a day  #30 x 1   Entered and Authorized by:   Johny Sax MD   Signed by:   Johny Sax  MD on 12/12/2009   Method used:   Print then Give to Patient   RxID:   [id number]

## 2010-03-13 NOTE — Miscellaneous (Signed)
Summary: [facility]   DDS   Imported By: Florinda Marker 02/05/2010 16:36:42  _____________________________________________________________________  External Attachment:    Type:   Image     Comment:   External Document

## 2010-03-13 NOTE — Progress Notes (Signed)
Summary: Anal warts have healed, referral to surgeon not needed  Phone Note Outgoing Call   Call placed by: Jennet Maduro RN,  January 27, 2010 12:32 PM Call placed to: Patient Action Taken: Phone Call Completed Summary of Call: RN spoke with pt. to let him know that the medication was going to be too expensive, even the generic.  RN explained that the new plan was to send him to a surgeon to be evaluated.  Pt. said that area has now healed.  It gives him very little discomfort.  He does not want to be referred at this time.  Jennet Maduro RN  January 27, 2010 12:34 PM

## 2010-03-13 NOTE — Miscellaneous (Signed)
Summary: Orders Update   Clinical Lists Changes  Orders: Added new Referral order of Surgical Referral (Surgery) - Signed 

## 2010-04-28 LAB — T-HELPER CELL (CD4) - (RCID CLINIC ONLY)
CD4 % Helper T Cell: 33 % (ref 33–55)
CD4 T Cell Abs: 550 uL (ref 400–2700)

## 2010-04-30 LAB — T-HELPER CELL (CD4) - (RCID CLINIC ONLY)
CD4 % Helper T Cell: 35 % (ref 33–55)
CD4 T Cell Abs: 550 uL (ref 400–2700)

## 2010-05-20 ENCOUNTER — Encounter: Payer: Self-pay | Admitting: Infectious Diseases

## 2010-06-11 ENCOUNTER — Other Ambulatory Visit: Payer: Self-pay

## 2010-06-11 DIAGNOSIS — B20 Human immunodeficiency virus [HIV] disease: Secondary | ICD-10-CM

## 2010-06-11 LAB — LIPID PANEL
Cholesterol: 172 mg/dL (ref 0–200)
HDL: 36 mg/dL — ABNORMAL LOW (ref >39)
Total CHOL/HDL Ratio: 4.8 Ratio
Triglycerides: 106 mg/dL (ref <150)

## 2010-06-12 LAB — HIV-1 RNA QUANT-NO REFLEX-BLD: HIV 1 RNA Quant: 14000 copies/mL — ABNORMAL HIGH (ref <20)

## 2010-06-12 LAB — COMPLETE METABOLIC PANEL WITH GFR
Alkaline Phosphatase: 68 U/L (ref 39–117)
BUN: 10 mg/dL (ref 6–23)
CO2: 26 mEq/L (ref 19–32)
Creat: 0.98 mg/dL (ref 0.40–1.50)
GFR, Est African American: >60 mL/min (ref >60)
GFR, Est Non African American: >60 mL/min (ref >60)
Glucose, Bld: 96 mg/dL (ref 70–99)
Sodium: 143 mEq/L (ref 135–145)
Total Bilirubin: 0.3 mg/dL (ref 0.3–1.2)

## 2010-06-12 LAB — CBC WITH DIFFERENTIAL/PLATELET
Eosinophils Absolute: 0.1 10*3/uL (ref 0.0–0.7)
Hemoglobin: 12.4 g/dL — ABNORMAL LOW (ref 13.0–17.0)
Lymphocytes Relative: 44 % (ref 12–46)
Lymphs Abs: 1.4 10*3/uL (ref 0.7–4.0)
MCH: 31.6 pg (ref 26.0–34.0)
Monocytes Relative: 9 % (ref 3–12)
Neutrophils Relative %: 44 % (ref 43–77)
RBC: 3.92 MIL/uL — ABNORMAL LOW (ref 4.22–5.81)
WBC: 3.3 10*3/uL — ABNORMAL LOW (ref 4.0–10.5)

## 2010-06-12 LAB — RPR

## 2010-06-12 LAB — T-HELPER CELL (CD4) - (RCID CLINIC ONLY)
CD4 % Helper T Cell: 30 % — ABNORMAL LOW (ref 33–55)
CD4 T Cell Abs: 420 uL (ref 400–2700)

## 2010-06-25 ENCOUNTER — Ambulatory Visit (INDEPENDENT_AMBULATORY_CARE_PROVIDER_SITE_OTHER): Payer: Self-pay | Admitting: Infectious Diseases

## 2010-06-25 ENCOUNTER — Other Ambulatory Visit: Payer: Self-pay | Admitting: Infectious Diseases

## 2010-06-25 ENCOUNTER — Encounter: Payer: Self-pay | Admitting: Infectious Diseases

## 2010-06-25 VITALS — BP 171/95 | HR 80 | Temp 98.5°F | Ht 69.0 in | Wt 138.3 lb

## 2010-06-25 DIAGNOSIS — B20 Human immunodeficiency virus [HIV] disease: Secondary | ICD-10-CM

## 2010-06-25 DIAGNOSIS — Z23 Encounter for immunization: Secondary | ICD-10-CM

## 2010-06-25 DIAGNOSIS — I10 Essential (primary) hypertension: Secondary | ICD-10-CM

## 2010-06-25 DIAGNOSIS — F3289 Other specified depressive episodes: Secondary | ICD-10-CM

## 2010-06-25 DIAGNOSIS — A630 Anogenital (venereal) warts: Secondary | ICD-10-CM

## 2010-06-25 DIAGNOSIS — E785 Hyperlipidemia, unspecified: Secondary | ICD-10-CM

## 2010-06-25 DIAGNOSIS — K029 Dental caries, unspecified: Secondary | ICD-10-CM

## 2010-06-25 DIAGNOSIS — F329 Major depressive disorder, single episode, unspecified: Secondary | ICD-10-CM

## 2010-06-25 MED ORDER — RAMIPRIL 2.5 MG PO CAPS: 5.0000 mg | ORAL_CAPSULE | Freq: Every day | ORAL | Status: DC

## 2010-06-25 MED ORDER — SERTRALINE HCL 25 MG PO TABS: 25.0000 mg | ORAL_TABLET | Freq: Every day | ORAL | Status: DC

## 2010-06-25 NOTE — Assessment & Plan Note (Signed)
Appears resolved. Will revisit.

## 2010-06-25 NOTE — Assessment & Plan Note (Signed)
Lipids nl last check. Will continue to follow

## 2010-06-25 NOTE — Assessment & Plan Note (Signed)
He is back on meds. His major issue right now is depression. Will see him back in 1 month.

## 2010-06-25 NOTE — Assessment & Plan Note (Signed)
Will increase his dose. See him back in 1 month

## 2010-06-25 NOTE — Assessment & Plan Note (Signed)
Will have him seen by dental  Here in clinic
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
## 2010-06-25 NOTE — Assessment & Plan Note (Addendum)
Alyssa and I spoke with him at length. He contracts to call us if he has suicidal ideation. Would like to start anti-depressant. Encouraged him to exercise at least 30 minutes/day. Eat regular meals. He has f/u with Apt with alyssa on monday

## 2010-06-26 ENCOUNTER — Other Ambulatory Visit: Payer: Self-pay | Admitting: Licensed Clinical Social Worker

## 2010-06-26 DIAGNOSIS — F329 Major depressive disorder, single episode, unspecified: Secondary | ICD-10-CM

## 2010-06-26 DIAGNOSIS — I10 Essential (primary) hypertension: Secondary | ICD-10-CM

## 2010-06-26 MED ORDER — LISINOPRIL 2.5 MG PO TABS: 2.5000 mg | ORAL_TABLET | Freq: Every day | ORAL | Status: DC

## 2010-06-26 MED ORDER — FLUOXETINE HCL 10 MG PO TABS: 10.0000 mg | ORAL_TABLET | Freq: Every day | ORAL | Status: DC

## 2010-08-18 ENCOUNTER — Ambulatory Visit: Payer: Self-pay | Admitting: Infectious Diseases

## 2010-08-18 ENCOUNTER — Encounter: Payer: Self-pay | Admitting: Infectious Diseases

## 2010-08-18 ENCOUNTER — Ambulatory Visit (INDEPENDENT_AMBULATORY_CARE_PROVIDER_SITE_OTHER): Payer: Self-pay | Admitting: Infectious Diseases

## 2010-08-18 DIAGNOSIS — I10 Essential (primary) hypertension: Secondary | ICD-10-CM

## 2010-08-18 DIAGNOSIS — B20 Human immunodeficiency virus [HIV] disease: Secondary | ICD-10-CM

## 2010-08-18 DIAGNOSIS — A630 Anogenital (venereal) warts: Secondary | ICD-10-CM

## 2010-08-18 MED ORDER — LISINOPRIL 2.5 MG PO TABS: 5.0000 mg | ORAL_TABLET | Freq: Every day | ORAL | Status: DC

## 2010-08-18 NOTE — Assessment & Plan Note (Signed)
Will f/u at next visit

## 2010-08-18 NOTE — Assessment & Plan Note (Addendum)
He had detectable virus at last visit as well as incomplete adherence. Will recheck his labs today and re-assess his viral/immunologic succes. He appears to be doing well. Offered condoms. rtc 3-4 months.

## 2010-08-18 NOTE — Progress Notes (Signed)
  Subjective:    Patient ID: Ryan Cruz, male    DOB: 21-Aug-1965, 45 y.o.   MRN: [id number]  HPI 45 yo M with HV+. Has been taking atripla. Last CD4 420 and VL 14,000, lipids nl (06-11-10).  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 Also hx of headaches, abd wall hernia and anal warts.  Thinks he is having blood pressure issues. Has been taking lisinopril 1 tab once a day. Having headaches, thought they were sinus issues. No chest pain or tightness. No sob.  No problems with atripla. Sleeping better, eating ok. Has been getting counseling with his preacher at his church. Taking prozac but feels that overall he feels well.     Review of Systems     Objective:   Physical Exam  Constitutional: He appears well-developed and well-nourished.  Eyes: EOM are normal. Pupils are equal, round, and reactive to light.  Neck: Neck supple.  Cardiovascular: Normal rate, regular rhythm and normal heart sounds.   Pulmonary/Chest: Effort normal and breath sounds normal. No respiratory distress.  Abdominal: Soft. Bowel sounds are normal. He exhibits no distension.  Lymphadenopathy:    He has no cervical adenopathy.  Psychiatric: He has a normal mood and affect.          Assessment & Plan:

## 2010-08-18 NOTE — Assessment & Plan Note (Signed)
Will increase his meds 5mg  qday

## 2010-08-19 LAB — T-HELPER CELL (CD4) - (RCID CLINIC ONLY): CD4 T Cell Abs: 630 uL (ref 400–2700)

## 2010-09-10 ENCOUNTER — Other Ambulatory Visit: Payer: Self-pay | Admitting: *Deleted

## 2010-09-10 DIAGNOSIS — I10 Essential (primary) hypertension: Secondary | ICD-10-CM

## 2010-09-10 MED ORDER — LISINOPRIL 2.5 MG PO TABS: 5.0000 mg | ORAL_TABLET | Freq: Every day | ORAL | Status: DC

## 2010-09-23 ENCOUNTER — Ambulatory Visit: Payer: Self-pay

## 2010-10-15 ENCOUNTER — Other Ambulatory Visit: Payer: Self-pay | Admitting: Infectious Diseases

## 2010-10-15 ENCOUNTER — Other Ambulatory Visit (INDEPENDENT_AMBULATORY_CARE_PROVIDER_SITE_OTHER): Payer: Self-pay

## 2010-10-15 ENCOUNTER — Other Ambulatory Visit: Payer: Self-pay

## 2010-10-15 DIAGNOSIS — B20 Human immunodeficiency virus [HIV] disease: Secondary | ICD-10-CM

## 2010-10-15 LAB — COMPLETE METABOLIC PANEL WITH GFR
AST: 20 U/L (ref 0–37)
Albumin: 4.4 g/dL (ref 3.5–5.2)
Alkaline Phosphatase: 69 U/L (ref 39–117)
BUN: 12 mg/dL (ref 6–23)
Calcium: 9.1 mg/dL (ref 8.4–10.5)
Creat: 0.93 mg/dL (ref 0.50–1.35)
GFR, Est Non African American: >60 mL/min (ref >60)
Glucose, Bld: 88 mg/dL (ref 70–99)
Potassium: 3.6 mEq/L (ref 3.5–5.3)

## 2010-10-15 LAB — CBC WITH DIFFERENTIAL/PLATELET
Basophils Absolute: 0 10*3/uL (ref 0.0–0.1)
Basophils Relative: 1 % (ref 0–1)
Eosinophils Relative: 3 % (ref 0–5)
HCT: 34.1 % — ABNORMAL LOW (ref 39.0–52.0)
Hemoglobin: 11.7 g/dL — ABNORMAL LOW (ref 13.0–17.0)
MCH: 32.2 pg (ref 26.0–34.0)
MCHC: 34.3 g/dL (ref 30.0–36.0)
MCV: 93.9 fL (ref 78.0–100.0)
Monocytes Absolute: 0.4 10*3/uL (ref 0.1–1.0)
Monocytes Relative: 8 % (ref 3–12)
Neutro Abs: 3.3 10*3/uL (ref 1.7–7.7)
RDW: 14 % (ref 11.5–15.5)

## 2010-10-16 LAB — T-HELPER CELL (CD4) - (RCID CLINIC ONLY)
CD4 % Helper T Cell: 31 % — ABNORMAL LOW (ref 33–55)
CD4 T Cell Abs: 580 uL (ref 400–2700)

## 2010-10-29 ENCOUNTER — Ambulatory Visit: Payer: Self-pay | Admitting: Infectious Diseases

## 2010-11-04 LAB — T-HELPER CELL (CD4) - (RCID CLINIC ONLY): CD4 % Helper T Cell: 28 — ABNORMAL LOW

## 2010-11-08 ENCOUNTER — Other Ambulatory Visit: Payer: Self-pay | Admitting: Infectious Diseases

## 2010-11-08 DIAGNOSIS — B20 Human immunodeficiency virus [HIV] disease: Secondary | ICD-10-CM

## 2010-11-11 LAB — T-HELPER CELL (CD4) - (RCID CLINIC ONLY): CD4 T Cell Abs: 490

## 2010-11-18 LAB — T-HELPER CELL (CD4) - (RCID CLINIC ONLY): CD4 % Helper T Cell: 32 — ABNORMAL LOW

## 2011-03-13 ENCOUNTER — Other Ambulatory Visit (INDEPENDENT_AMBULATORY_CARE_PROVIDER_SITE_OTHER): Payer: Self-pay

## 2011-03-13 ENCOUNTER — Other Ambulatory Visit: Payer: Self-pay | Admitting: *Deleted

## 2011-03-13 DIAGNOSIS — Z113 Encounter for screening for infections with a predominantly sexual mode of transmission: Secondary | ICD-10-CM

## 2011-03-13 DIAGNOSIS — B20 Human immunodeficiency virus [HIV] disease: Secondary | ICD-10-CM

## 2011-03-13 LAB — CBC WITH DIFFERENTIAL/PLATELET
Basophils Absolute: 0 10*3/uL (ref 0.0–0.1)
Basophils Relative: 1 % (ref 0–1)
Lymphocytes Relative: 39 % (ref 12–46)
MCHC: 33.9 g/dL (ref 30.0–36.0)
Neutro Abs: 2.9 10*3/uL (ref 1.7–7.7)
Neutrophils Relative %: 50 % (ref 43–77)
RDW: 13.3 % (ref 11.5–15.5)
WBC: 5.9 10*3/uL (ref 4.0–10.5)

## 2011-03-13 LAB — COMPLETE METABOLIC PANEL WITH GFR
Albumin: 4.6 g/dL (ref 3.5–5.2)
BUN: 7 mg/dL (ref 6–23)
CO2: 24 mEq/L (ref 19–32)
GFR, Est African American: >89 mL/min
GFR, Est Non African American: >89 mL/min
Glucose, Bld: 95 mg/dL (ref 70–99)
Potassium: 4 mEq/L (ref 3.5–5.3)
Sodium: 140 mEq/L (ref 135–145)
Total Bilirubin: 0.3 mg/dL (ref 0.3–1.2)
Total Protein: 7.4 g/dL (ref 6.0–8.3)

## 2011-03-13 LAB — URINALYSIS, ROUTINE W REFLEX MICROSCOPIC
Leukocytes, UA: NEGATIVE
Nitrite: NEGATIVE
Protein, ur: NEGATIVE mg/dL
Urobilinogen, UA: 0.2 mg/dL (ref 0.0–1.0)

## 2011-03-14 LAB — GC/CHLAMYDIA PROBE AMP, URINE: Chlamydia, Swab/Urine, PCR: NEGATIVE

## 2011-03-25 ENCOUNTER — Telehealth: Payer: Self-pay | Admitting: *Deleted

## 2011-03-25 ENCOUNTER — Ambulatory Visit (INDEPENDENT_AMBULATORY_CARE_PROVIDER_SITE_OTHER): Payer: Self-pay | Admitting: Infectious Diseases

## 2011-03-25 ENCOUNTER — Encounter: Payer: Self-pay | Admitting: Infectious Diseases

## 2011-03-25 VITALS — BP 137/86 | HR 88 | Temp 98.3°F | Wt 143.0 lb

## 2011-03-25 DIAGNOSIS — Z23 Encounter for immunization: Secondary | ICD-10-CM

## 2011-03-25 DIAGNOSIS — Z113 Encounter for screening for infections with a predominantly sexual mode of transmission: Secondary | ICD-10-CM

## 2011-03-25 DIAGNOSIS — Z79899 Other long term (current) drug therapy: Secondary | ICD-10-CM

## 2011-03-25 DIAGNOSIS — B20 Human immunodeficiency virus [HIV] disease: Secondary | ICD-10-CM

## 2011-03-25 DIAGNOSIS — K439 Ventral hernia without obstruction or gangrene: Secondary | ICD-10-CM

## 2011-03-25 DIAGNOSIS — A630 Anogenital (venereal) warts: Secondary | ICD-10-CM

## 2011-03-25 DIAGNOSIS — I10 Essential (primary) hypertension: Secondary | ICD-10-CM

## 2011-03-25 NOTE — Assessment & Plan Note (Signed)
Will check u/s to further eval. Does not seem like surgical candidate yet. Spoke to him about signs/sx of incarceration (unresolving protrusion, unremitting pain) and need to go to hospital ASAP.

## 2011-03-25 NOTE — Assessment & Plan Note (Signed)
Resolved per pt, will continue to monitor

## 2011-03-25 NOTE — Assessment & Plan Note (Signed)
Doing well on low dose of ACE-I. Will continue to monitor.

## 2011-03-25 NOTE — Assessment & Plan Note (Signed)
He is doing very well, gets flu shot. Given condoms. Continue his same meds. Will see him back in 6 months with labs prior.

## 2011-03-25 NOTE — Progress Notes (Signed)
  Subjective:    Patient ID: Ryan Cruz, male    DOB: 1965-11-20, 46 y.o.   MRN: [id number]  HPI 46 yo M with HV+ and HTN. Has been taking atripla. lisinopril. Last CD4 730 and VL <20 (03-13-11), lipids nl (06-11-10).  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 Also hx of headaches, abd wall hernia and anal warts. Without complaints. Had GI distress last night after eating sandwhich from [place].  Has RUQ hernia which goes back and forth. Never stuck. Feels funny.    Review of Systems  Constitutional: Negative for appetite change and unexpected weight change.  Gastrointestinal: Negative for diarrhea and constipation.  Genitourinary: Negative for dysuria.  Neurological: Negative for headaches.       Objective:   Physical Exam  Constitutional: He appears well-developed and well-nourished.  Eyes: EOM are normal. Pupils are equal, round, and reactive to light.  Neck: Neck supple.  Cardiovascular: Normal rate, regular rhythm and normal heart sounds.   Pulmonary/Chest: Effort normal and breath sounds normal.  Abdominal: Soft. Bowel sounds are normal. He exhibits no distension and no mass. There is no tenderness. There is no rebound and no guarding.    Lymphadenopathy:    He has no cervical adenopathy.          Assessment & Plan:

## 2011-03-25 NOTE — Telephone Encounter (Signed)
Patient needs abdominal ultrasound and he is applying for a discount card.  Wants to wait until he is approved to schedule this test. Wendall Mola CMA

## 2011-04-01 ENCOUNTER — Ambulatory Visit: Payer: Self-pay

## 2011-05-20 ENCOUNTER — Other Ambulatory Visit: Payer: Self-pay | Admitting: Infectious Diseases

## 2011-05-20 DIAGNOSIS — B20 Human immunodeficiency virus [HIV] disease: Secondary | ICD-10-CM

## 2011-06-11 ENCOUNTER — Telehealth: Payer: Self-pay | Admitting: *Deleted

## 2011-06-11 ENCOUNTER — Encounter: Payer: Self-pay | Admitting: Internal Medicine

## 2011-06-11 ENCOUNTER — Ambulatory Visit (INDEPENDENT_AMBULATORY_CARE_PROVIDER_SITE_OTHER): Payer: Self-pay | Admitting: Internal Medicine

## 2011-06-11 VITALS — BP 160/92 | HR 64 | Temp 98.4°F | Ht 67.0 in | Wt 152.0 lb

## 2011-06-11 DIAGNOSIS — J209 Acute bronchitis, unspecified: Secondary | ICD-10-CM

## 2011-06-11 DIAGNOSIS — J40 Bronchitis, not specified as acute or chronic: Secondary | ICD-10-CM

## 2011-06-11 MED ORDER — SULFAMETHOXAZOLE-TRIMETHOPRIM 800-160 MG PO TABS: 1.0000 | ORAL_TABLET | Freq: Two times a day (BID) | ORAL | Status: AC

## 2011-06-11 MED ORDER — DOXYCYCLINE HYCLATE 100 MG PO TABS: 100.0000 mg | ORAL_TABLET | Freq: Two times a day (BID) | ORAL | Status: DC

## 2011-06-11 MED ORDER — SULFAMETHOXAZOLE-TRIMETHOPRIM 800-160 MG PO TABS: 1.0000 | ORAL_TABLET | Freq: Two times a day (BID) | ORAL | Status: DC

## 2011-06-11 NOTE — Assessment & Plan Note (Signed)
That he says he's never had bronchitis in the past, he has been previously diagnosed years before. I suspect this is bronchitis. His lungs are clear and I do not suspect pneumonia. I did swab him for strep throat but I suspect this will be negative. I have given him a course of doxycycline for the bronchitis. He is told to return if his symptoms do not resolve but that it may take another week to 2 weeks. I also encouraged him to quit smoking as I did attribute much of this to his smoking.

## 2011-06-11 NOTE — Progress Notes (Signed)
  Subjective:    Patient ID: Ryan Cruz, male    DOB: 1965-07-20, 46 y.o.   MRN: [id number]  HPI He comes in for a sick visit. He has had one week of fever to 101, productive cough with brownish sputum, chills, headache. He is never had significant symptoms like this. He does also complain of a sore throat. No sick contacts. He also has decreased p.o. Intake.   Review of Systems  Constitutional: Positive for fever, chills, activity change and fatigue.  HENT: Positive for sore throat. Negative for mouth sores.   Respiratory: Positive for cough. Negative for shortness of breath and wheezing.   Gastrointestinal: Negative for nausea and diarrhea.  Skin: Negative for rash.  Neurological: Positive for headaches.       Objective:   Physical Exam  Constitutional: He appears well-developed and well-nourished. No distress.  HENT:  Mouth/Throat: Oropharynx is clear and moist. No oropharyngeal exudate.  Neck:       Left anterior small cervical lymphadenopathy, x2, no tenderness  Cardiovascular: Normal rate, regular rhythm and normal heart sounds.  Exam reveals no gallop and no friction rub.   No murmur heard. Pulmonary/Chest: Effort normal and breath sounds normal. No respiratory distress. He has no wheezes. He has no rales.  Abdominal: Soft. Bowel sounds are normal. He exhibits no distension. There is no tenderness. There is no rebound.  Skin: No rash noted.          Assessment & Plan:

## 2011-06-11 NOTE — Progress Notes (Signed)
Addended by: Mariea Clonts D on: 06/11/2011 12:28 PM   Modules accepted: Orders

## 2011-06-11 NOTE — Telephone Encounter (Signed)
He called asking to be seen today. C/o cold s/s x 1 week. Endorses fever, sweats, head congestion, cough & runny nose. The sweats wake him at night. I checked with the front-no appts. I asked Dr. Luciana Axe if he would see him & he said yes, asked the pt to come now

## 2011-06-11 NOTE — Telephone Encounter (Signed)
Patient needed this Rx called in to [facility] on [address] as it is an ADAP covered Rx.

## 2011-06-11 NOTE — Telephone Encounter (Signed)
Patient never applied for discount card with Britta Mccreedy and did speak with him about this and unsure if he still wants to have the test done.  Will readdress at next office visit. Wendall Mola CMA

## 2011-09-09 ENCOUNTER — Other Ambulatory Visit: Payer: Self-pay

## 2011-09-10 ENCOUNTER — Telehealth: Payer: Self-pay | Admitting: *Deleted

## 2011-09-10 NOTE — Telephone Encounter (Signed)
Patient called c/o mouth pain and oral abcess, given appointment with Dr. Drue Second for tomorrow at 10:30 AM. Wendall Mola CMA

## 2011-09-11 ENCOUNTER — Ambulatory Visit (INDEPENDENT_AMBULATORY_CARE_PROVIDER_SITE_OTHER): Payer: Self-pay | Admitting: Internal Medicine

## 2011-09-11 ENCOUNTER — Encounter: Payer: Self-pay | Admitting: Internal Medicine

## 2011-09-11 VITALS — BP 136/90 | HR 75 | Temp 99.0°F | Wt 139.0 lb

## 2011-09-11 DIAGNOSIS — K047 Periapical abscess without sinus: Secondary | ICD-10-CM

## 2011-09-11 MED ORDER — HYDROCODONE-ACETAMINOPHEN 7.5-500 MG PO TABS: 1.0000 | ORAL_TABLET | Freq: Three times a day (TID) | ORAL | Status: AC | PRN

## 2011-09-11 MED ORDER — AMOXICILLIN 500 MG PO CAPS: 500.0000 mg | ORAL_CAPSULE | Freq: Three times a day (TID) | ORAL | Status: AC

## 2011-09-11 NOTE — Progress Notes (Signed)
HIV CLINIC VISIT RFV: abscessed tooth Subjective:    Patient ID: Ryan Cruz, male    DOB: 1966/01/07, 46 y.o.   MRN: [id number]  HPI Bertil is 46yo Male with HIV, CD 4 count of 730 930%)/ VL<20, on Atripla with great adherence. He reports having acute onset of left upper molar pain, cheek swelling, and tenderness x 2-3 days. He states that this came up after eating some peanuts. He questions if some food particle was lodged in his teeth. He has been using tylenol and last night had some pain relief after taking vicodin. He states that he noticed that the swelling improved, after "it burst" and spontaneous drainage. He states the pain is still significant. He doesn't see a dentist regularly. He denies fever.chills.nightsweats. Severely tender at maxillary area. Current Outpatient Prescriptions on File Prior to Visit  Medication Sig Dispense Refill  . ATRIPLA 600-200-300 MG per tablet TAKE 1 TABLET BY MOUTH EVERY NIGHT AT BEDTIME  30 tablet  5  . FLUoxetine (PROZAC) 10 MG tablet Take 1 tablet (10 mg total) by mouth daily.  30 tablet  3  . imiquimod (ALDARA) 5 % cream Apply topically 3 (three) times a week. Apply to affected area m-w-f at bedtime. Wash off in am       . lisinopril (PRINIVIL,ZESTRIL) 2.5 MG tablet Take 2 tablets (5 mg total) by mouth daily.  60 tablet  3   Active Ambulatory Problems    Diagnosis Date Noted  . HIV DISEASE 02/24/2006  . 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 HYPERLIPIDEMIA 02/24/2006  . TOBACCO USER 06/01/2007  . NEUROPATHY, NOS 04/30/2008  . HYPERTENSION 12/12/2009  . ACUTE BRONCHITIS 12/21/2006  . ASTHMA 12/11/1994  . DENTAL CARIES 12/14/2007  . ABDOMINAL WALL HERNIA 03/28/2009  . VERTIGO 05/13/2007  . LYMPHADENOPATHY 07/06/2006  . HEPATITIS B, HX OF 02/24/2006  . RECTAL BLEEDING, HX OF 11/19/2005  . Depression 06/25/2010   Resolved Ambulatory Problems    Diagnosis Date Noted  . Hernia of abdominal wall 03/25/2011   No Additional Past Medical History      Review of Systems Per Hpi. Otherwise negative    Objective:   Physical Exam BP 136/90  Pulse 75  Temp 99 F (37.2 C) (Oral)  Wt 139 lb (63.05 kg) HEENT= [place]/AT, PERRLA, EOMI, slightly swollen left cheek. He has broken fragments left of his upper rear molars on both right and left upper jaw. His gums around the left rear molar are inflammed and swollen. He is tender when tongue depressor touches fragmented tooth. Some bleeding noted but unclear of source.     Assessment & Plan:  Tooth abscess = amox 500 TID x 14 d; dental appoint established on 8/30 for possible tooth extraction  Tooth pain =vicodin #30 NR  rtc in 2 wks

## 2011-09-23 ENCOUNTER — Ambulatory Visit: Payer: Self-pay | Admitting: Infectious Diseases

## 2011-09-23 ENCOUNTER — Ambulatory Visit: Payer: Self-pay

## 2011-09-23 ENCOUNTER — Telehealth: Payer: Self-pay | Admitting: *Deleted

## 2011-09-23 NOTE — Telephone Encounter (Signed)
Called and spoke with patient, he no showed appt today.  He also no showed his lab and appt with Lesle Reek, rescheduled lab for tomorrow and Barb rescheduled for 10/07/11.  Have not rescheduled MD appt, as I am waiting for patient to get his Juanell Fairly renewed. Wendall Mola CMA

## 2011-09-24 ENCOUNTER — Other Ambulatory Visit: Payer: Self-pay

## 2011-10-07 ENCOUNTER — Ambulatory Visit: Payer: Self-pay

## 2011-10-19 ENCOUNTER — Ambulatory Visit: Payer: Self-pay

## 2011-11-25 ENCOUNTER — Telehealth: Payer: Self-pay | Admitting: *Deleted

## 2011-11-25 NOTE — Telephone Encounter (Signed)
Patient called requesting refills for Prozac and Lisinopril, both of these have not been filled since November 2012.  Advised he will have to address this at next office visit in November.  He said he has been off these meds for some time. Wendall Mola CMA

## 2011-11-26 ENCOUNTER — Other Ambulatory Visit: Payer: Self-pay | Admitting: Licensed Clinical Social Worker

## 2011-11-26 DIAGNOSIS — I10 Essential (primary) hypertension: Secondary | ICD-10-CM

## 2011-11-26 MED ORDER — LISINOPRIL 2.5 MG PO TABS: 5.0000 mg | ORAL_TABLET | Freq: Every day | ORAL | Status: DC

## 2011-12-03 ENCOUNTER — Other Ambulatory Visit: Payer: Self-pay

## 2011-12-03 DIAGNOSIS — B20 Human immunodeficiency virus [HIV] disease: Secondary | ICD-10-CM

## 2011-12-03 DIAGNOSIS — Z79899 Other long term (current) drug therapy: Secondary | ICD-10-CM

## 2011-12-03 DIAGNOSIS — Z113 Encounter for screening for infections with a predominantly sexual mode of transmission: Secondary | ICD-10-CM

## 2011-12-03 LAB — CBC
HCT: 37.7 % — ABNORMAL LOW (ref 39.0–52.0)
Hemoglobin: 13.1 g/dL (ref 13.0–17.0)
RBC: 4.12 MIL/uL — ABNORMAL LOW (ref 4.22–5.81)
WBC: 4.1 10*3/uL (ref 4.0–10.5)

## 2011-12-03 LAB — COMPLETE METABOLIC PANEL WITH GFR
Albumin: 4.8 g/dL (ref 3.5–5.2)
BUN: 11 mg/dL (ref 6–23)
Calcium: 9.3 mg/dL (ref 8.4–10.5)
Chloride: 104 mEq/L (ref 96–112)
GFR, Est Non African American: 89 mL/min
Glucose, Bld: 94 mg/dL (ref 70–99)
Potassium: 3.8 mEq/L (ref 3.5–5.3)

## 2011-12-03 LAB — LIPID PANEL
Cholesterol: 219 mg/dL — ABNORMAL HIGH (ref 0–200)
VLDL: 40 mg/dL (ref 0–40)

## 2011-12-03 LAB — RPR

## 2011-12-04 LAB — T-HELPER CELL (CD4) - (RCID CLINIC ONLY)
CD4 % Helper T Cell: 35 % (ref 33–55)
CD4 T Cell Abs: 540 uL (ref 400–2700)

## 2011-12-06 LAB — HIV-1 RNA QUANT-NO REFLEX-BLD: HIV-1 RNA Quant, Log: <1.3 {Log} (ref <1.30)

## 2011-12-17 ENCOUNTER — Encounter: Payer: Self-pay | Admitting: Infectious Diseases

## 2011-12-17 ENCOUNTER — Ambulatory Visit (INDEPENDENT_AMBULATORY_CARE_PROVIDER_SITE_OTHER): Payer: Self-pay | Admitting: Infectious Diseases

## 2011-12-17 ENCOUNTER — Other Ambulatory Visit (HOSPITAL_COMMUNITY): Admission: RE | Admit: 2011-12-17 | Payer: Self-pay | Source: Ambulatory Visit | Admitting: Infectious Diseases

## 2011-12-17 VITALS — BP 144/83 | HR 77 | Temp 98.9°F | Wt 144.0 lb

## 2011-12-17 DIAGNOSIS — I10 Essential (primary) hypertension: Secondary | ICD-10-CM

## 2011-12-17 DIAGNOSIS — Z23 Encounter for immunization: Secondary | ICD-10-CM

## 2011-12-17 DIAGNOSIS — F329 Major depressive disorder, single episode, unspecified: Secondary | ICD-10-CM

## 2011-12-17 DIAGNOSIS — F17200 Nicotine dependence, unspecified, uncomplicated: Secondary | ICD-10-CM

## 2011-12-17 DIAGNOSIS — B20 Human immunodeficiency virus [HIV] disease: Secondary | ICD-10-CM

## 2011-12-17 DIAGNOSIS — F32A Depression, unspecified: Secondary | ICD-10-CM

## 2011-12-17 DIAGNOSIS — K439 Ventral hernia without obstruction or gangrene: Secondary | ICD-10-CM

## 2011-12-17 DIAGNOSIS — Z8719 Personal history of other diseases of the digestive system: Secondary | ICD-10-CM

## 2011-12-17 MED ORDER — FLUOXETINE HCL 10 MG PO TABS: 10.0000 mg | ORAL_TABLET | Freq: Every day | ORAL | Status: DC

## 2011-12-17 MED ORDER — EFAVIRENZ-EMTRICITAB-TENOFOVIR 600-200-300 MG PO TABS: 1.0000 | ORAL_TABLET | Freq: Every day | ORAL | Status: DC

## 2011-12-17 MED ORDER — LISINOPRIL 5 MG PO TABS: 5.0000 mg | ORAL_TABLET | Freq: Every day | ORAL | Status: DC

## 2011-12-17 NOTE — Progress Notes (Signed)
  Subjective:    Patient ID: Ryan Cruz, male    DOB: 04/06/1965, 46 y.o.   MRN: [id number]  HPI 46 yo M with HV+ and HTN. Has been taking atripla. 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 Also hx of headaches, abd wall hernia and anal warts. Was seen over the summer for bronchitis, dental infection.  Teeth are better. No problems with atripla. Forgets to take about once a week. Usually when he is busy, comes home late. Remembers if he uses his pill box. Did run out of his BP medication.  Has not had pain from his hernia. Has rare blood in his stool, mostly when he eats spicy foods and beans.  HIV 1 RNA Quant (copies/mL)  Date Value  12/03/2011 <20   03/13/2011 <20   10/15/2010 <20      CD4 T Cell Abs (cmm)  Date Value  12/03/2011 540   03/13/2011 730   10/15/2010 580       Review of Systems  Constitutional: Negative for appetite change and unexpected weight change.  Gastrointestinal: Negative for diarrhea and constipation.  Genitourinary: Negative for difficulty urinating.  Neurological: Positive for headaches.       More tightness, not throbing or pounding.   Psychiatric/Behavioral: The patient is nervous/anxious.        Objective:   Physical Exam  Constitutional: He appears well-developed and well-nourished.  HENT:  Mouth/Throat: No oropharyngeal exudate.  Eyes: EOM are normal. Pupils are equal, round, and reactive to light.  Neck: Neck supple.  Cardiovascular: Normal rate, regular rhythm and normal heart sounds.   Pulmonary/Chest: Effort normal and breath sounds normal.  Abdominal: Soft. Bowel sounds are normal. There is no tenderness. There is no rebound.  Genitourinary:       No lesions on anus. Pap sent. Gauic (-)  Lymphadenopathy:    He has no cervical adenopathy.          Assessment & Plan:

## 2011-12-17 NOTE — Assessment & Plan Note (Signed)
Appears to be doing well, with combination of meds and self talk. Will refill his prozac.

## 2011-12-17 NOTE — Assessment & Plan Note (Signed)
He is doing very well. Encouraged adherence. Gets flu shot today. Offered/refused condoms. Will see back in 6 months

## 2011-12-17 NOTE — Assessment & Plan Note (Signed)
Anal pap sent, he will good candidate for anoscopy, possible sigmoidoscopy.

## 2011-12-17 NOTE — Assessment & Plan Note (Signed)
Encouraged to quit smoking. States he only smokes 3-5 cigarettes/day.

## 2011-12-17 NOTE — Assessment & Plan Note (Signed)
Will continue to monitor. rtc asap if pain, or not able to reduce.

## 2011-12-31 ENCOUNTER — Telehealth: Payer: Self-pay | Admitting: *Deleted

## 2011-12-31 NOTE — Telephone Encounter (Signed)
Patient called to advise that he is having rectal pain for about 1 week. He advised that there are no ulcers or sores in the area just the pain like "someone scratched his rectum with a sharp fingernail". Advised he has used cream and nothing helps he wants to see the doctor. Gave him an appt for 01/04/12.

## 2012-01-01 ENCOUNTER — Telehealth: Payer: Self-pay | Admitting: *Deleted

## 2012-01-01 NOTE — Telephone Encounter (Signed)
   Pt c/o "Weird anal discomfort, raw and tender.  Could be my anal wart causing the problem."  Requesting appt.

## 2012-01-04 ENCOUNTER — Ambulatory Visit (INDEPENDENT_AMBULATORY_CARE_PROVIDER_SITE_OTHER): Payer: Self-pay | Admitting: Infectious Diseases

## 2012-01-04 ENCOUNTER — Encounter: Payer: Self-pay | Admitting: Infectious Diseases

## 2012-01-04 VITALS — BP 164/96 | HR 73 | Temp 98.3°F | Ht 67.0 in | Wt 143.0 lb

## 2012-01-04 DIAGNOSIS — L0591 Pilonidal cyst without abscess: Secondary | ICD-10-CM

## 2012-01-04 DIAGNOSIS — B20 Human immunodeficiency virus [HIV] disease: Secondary | ICD-10-CM

## 2012-01-04 DIAGNOSIS — I10 Essential (primary) hypertension: Secondary | ICD-10-CM

## 2012-01-04 MED ORDER — HYDROCODONE-ACETAMINOPHEN 5-325 MG PO TABS: 1.0000 | ORAL_TABLET | Freq: Three times a day (TID) | ORAL | Status: DC | PRN

## 2012-01-04 MED ORDER — LISINOPRIL 5 MG PO TABS: 10.0000 mg | ORAL_TABLET | Freq: Every day | ORAL | Status: DC

## 2012-01-04 MED ORDER — SULFAMETHOXAZOLE-TRIMETHOPRIM 800-160 MG PO TABS: 1.0000 | ORAL_TABLET | Freq: Two times a day (BID) | ORAL | Status: AC

## 2012-01-04 NOTE — Assessment & Plan Note (Signed)
Appears to be doing well. will continue to follow.

## 2012-01-04 NOTE — Progress Notes (Signed)
  Subjective:    Patient ID: Ryan Cruz, male    DOB: [birth year], 46 y.o.   MRN: [id number]  HPI 46 yo M with HV+ and HTN. Has been taking atripla. 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 Also hx of headaches, abd wall hernia and anal warts.  Was seen over the summer for bronchitis, dental infection.  At prev visit 2 weeks ago, had anal pap (-). Today complains of anal pain, bleeding. Not sure if he has warts, abscesses? States one of them "popped". Has been bathing 2-3x/day. Had fever 1 day. Previously had warts and was given aldera cream. He never had it filled due to $.   HIV 1 RNA Quant (copies/mL)  Date Value  12/03/2011 <20   03/13/2011 <20   10/15/2010 <20      CD4 T Cell Abs (cmm)  Date Value  12/03/2011 540   03/13/2011 730   10/15/2010 580      Review of Systems  Constitutional: Positive for fever. Negative for chills and appetite change.  Respiratory: Negative for chest tightness and shortness of breath.   Cardiovascular: Negative for chest pain.  Gastrointestinal: Positive for diarrhea. Negative for constipation.  Genitourinary: Negative for difficulty urinating.  Neurological: Negative for headaches.       Objective:   Physical Exam  Constitutional: He appears well-developed and well-nourished.  Cardiovascular: Normal rate, regular rhythm and normal heart sounds.   Pulmonary/Chest: Effort normal and breath sounds normal.  Abdominal: Soft. Bowel sounds are normal. There is no tenderness.  Genitourinary:             Assessment & Plan:

## 2012-01-04 NOTE — Assessment & Plan Note (Signed)
Will start him on bactrim. Will have him seen by surgery. Consider anoscopy? Prev condyloma.Marland KitchenMarland Kitchen

## 2012-01-04 NOTE — Assessment & Plan Note (Signed)
Will increase his ACE-I 

## 2012-01-04 NOTE — Addendum Note (Signed)
Addended by: Jennet Maduro D on: 01/04/2012 09:54 AM   Modules accepted: Orders

## 2012-01-05 ENCOUNTER — Telehealth: Payer: Self-pay | Admitting: *Deleted

## 2012-01-05 NOTE — Telephone Encounter (Signed)
Called and left patient a voice mail with his appt at [facility] with Romie Levee on 02/01/12 at 10:00 AM. Wendall Mola CMA

## 2012-01-13 ENCOUNTER — Telehealth: Payer: Self-pay | Admitting: *Deleted

## 2012-01-13 DIAGNOSIS — L0591 Pilonidal cyst without abscess: Secondary | ICD-10-CM

## 2012-01-13 MED ORDER — HYDROCODONE-ACETAMINOPHEN 5-325 MG PO TABS: 1.0000 | ORAL_TABLET | Freq: Three times a day (TID) | ORAL | Status: DC | PRN

## 2012-01-13 NOTE — Telephone Encounter (Signed)
Ok to refill   thanks

## 2012-01-13 NOTE — Telephone Encounter (Signed)
Rectal pain continuing.  Surgery appt 02/01/12 @ [facility].  Requesting refill of pain rx.  MD please advise.

## 2012-01-13 NOTE — Addendum Note (Signed)
Addended by: Jennet Maduro D on: 01/13/2012 09:14 AM   Modules accepted: Orders

## 2012-01-19 ENCOUNTER — Ambulatory Visit (INDEPENDENT_AMBULATORY_CARE_PROVIDER_SITE_OTHER): Payer: Self-pay | Admitting: Surgery

## 2012-02-01 ENCOUNTER — Encounter (INDEPENDENT_AMBULATORY_CARE_PROVIDER_SITE_OTHER): Payer: Self-pay | Admitting: General Surgery

## 2012-02-01 ENCOUNTER — Ambulatory Visit (INDEPENDENT_AMBULATORY_CARE_PROVIDER_SITE_OTHER): Payer: PRIVATE HEALTH INSURANCE | Admitting: General Surgery

## 2012-02-01 VITALS — BP 128/74 | HR 61 | Temp 98.0°F | Resp 18 | Ht 67.0 in | Wt 142.0 lb

## 2012-02-01 DIAGNOSIS — K649 Unspecified hemorrhoids: Secondary | ICD-10-CM

## 2012-02-01 NOTE — Progress Notes (Signed)
Chief Complaint  Patient presents with  . New Evaluation    anal warts    HISTORY: Ryan Cruz is a 46 y.o. male who presents to the office with rectal bleeding and pain.  Other symptoms include diarrhea and a perianal mass.  He has tried prep H and suppositories in the past with some success.  Hard stools makes the symptoms worse.  This had been occurring for a couple weeks.  It is intermittent in nature.  His bowel habits are regular and his bowel movements are soft.  His fiber intake is low.  He has never had a colonoscopy.  He has a family history of some uncles with colon cancer in their 60's.   No past medical history on file.   HIV  No past surgical history on file.     Umbilical hernia repair as a child  Current Outpatient Prescriptions  Medication Sig Dispense Refill  . efavirenz-emtricitabine-tenofovir (ATRIPLA) 600-200-300 MG per tablet Take 1 tablet by mouth at bedtime.  90 tablet  5  . FLUoxetine (PROZAC) 10 MG tablet Take 1 tablet (10 mg total) by mouth daily.  90 tablet  3  . HYDROcodone-acetaminophen (NORCO/VICODIN) 5-325 MG per tablet Take 1 tablet by mouth every 8 (eight) hours as needed for pain.  30 tablet  1  . lisinopril (PRINIVIL,ZESTRIL) 5 MG tablet Take 2 tablets (10 mg total) by mouth daily.  90 tablet  3  . imiquimod (ALDARA) 5 % cream Apply topically 3 (three) times a week. Apply to affected area m-w-f at bedtime. Wash off in am           No Known Allergies    Family History  Problem Relation Age of Onset  . Diabetes Mother   . Hypertension Mother   . Heart disease Mother   . Diabetes Father     History   Social History  . Marital Status: Married    Spouse Name: N/A    Number of Children: N/A  . Years of Education: N/A   Social History Main Topics  . Smoking status: Current Every Day Smoker -- 0.3 packs/day for 15 years    Types: Cigarettes  . Smokeless tobacco: Never Used  . Alcohol Use: 1.2 oz/week    1 Glasses of wine, 1 Shots of liquor  per week     Comment: occasional  . Drug Use: Yes    Special: Marijuana     Comment: marijuana  . Sexually Active: Yes -- Male partner(s)     Comment: pt. declined condoms   Other Topics Concern  . None   Social History Narrative  . None      REVIEW OF SYSTEMS - PERTINENT POSITIVES ONLY: Review of Systems - General ROS: negative for - chills or fever Hematological and Lymphatic ROS: negative for - bleeding problems or blood clots Respiratory ROS: no cough, shortness of breath, or wheezing Cardiovascular ROS: no chest pain or dyspnea on exertion Gastrointestinal ROS: negative for - abdominal pain or appetite loss  EXAM: Filed Vitals:   02/01/12 1035  BP: 128/74  Pulse: 61  Temp: 98 F (36.7 C)  Resp: 18   General appearance: alert and cooperative Resp: clear to auscultation bilaterally Cardio: regular rate and rhythm GI: soft, non-tender; bowel sounds normal; no masses,  no organomegaly  Procedure: Anoscopy Surgeon: Wofford Stratton Diagnosis: rectal pain, bleeding  Assistant: Glaspey After the risks and benefits were explained, verbal consent was obtained for above procedure  Anesthesia: none Findings: moderate   internal hemorrhoids, no skin tags, no abscess, no signs of fistula, tender to palpation on right side of anal canal, no condyloma  LABORATORY RESULTS: Available labs are reviewed  Last CD4 >500    ASSESSMENT AND PLAN: Ryan Cruz is a 46 y.o. M who presents to me with anal pain and rectal bleeding.  Anoscopy only revealed hemorrhoids.  I suspect he had a thrombosed hemorrhoid or possibly a small abscess that has since resolved.  He has a family history of colon cancer and with the rectal bleeding, I would like him to get a colonoscopy.  For his hemorrhoids, I have instructed him to increase his fiber intake.   Ryan Gambrel C Imanii Gosdin, MD Colon and Rectal Surgery / General Surgery [facility]      Visit Diagnoses: 1. Hemorrhoid     Primary  Care Physician: Jeffrey Hatcher, MD   

## 2012-02-01 NOTE — Patient Instructions (Addendum)
GETTING TO GOOD BOWEL HEALTH. Irregular bowel habits such as diarrhea can lead to many problems over time.  Having one soft, formed bowel movement a day is the most important way to prevent further problems.  The anorectal canal is designed to handle stretching and feces to safely manage our ability to get rid of solid waste (feces, poop, stool) out of our body.  BUT, diarrhea can be a burning fire to this very sensitive area of our body, causing inflamed hemorrhoids, anal fissures, increasing risk is perirectal abscesses, abdominal pain and bloating.     The goal: ONE SOFT BOWEL MOVEMENT A DAY!  To have soft, regular bowel movements:    Drink at least 8 tall glasses of water a day.     Take plenty of fiber.  Fiber is the undigested part of plant food that passes into the colon, acting s "natures broom" to encourage bowel motility and movement.  Fiber can absorb and hold large amounts of water. This results in a larger, bulkier stool, which is soft and easier to pass. Work gradually over several weeks up to 6 servings a day of fiber (25g a day even more if needed) in the form of: o Vegetables -- Root (potatoes, carrots, turnips), leafy green (lettuce, salad greens, celery, spinach), or cooked high residue (cabbage, broccoli, etc) o Fruit -- Fresh (unpeeled skin & pulp), Dried (prunes, apricots, cherries, etc ),  or stewed ( applesauce)  o Whole grain breads, pasta, etc (whole wheat)  o Bran cereals    Bulking Agents -- This type of water-retaining fiber generally is easily obtained each day by one of the following:  o Psyllium bran -- The psyllium plant is remarkable because its ground seeds can retain so much water. This product is available as Metamucil, Konsyl, Effersyllium, Per Diem Fiber, or the less expensive generic preparation in drug and health food stores. Although labeled a laxative, it really is not a laxative.  o Methylcellulose -- This is another fiber derived from wood which also retains  water. It is available as Citrucel. o Polyethylene Glycol - and "artificial" fiber commonly called Miralax or Glycolax.  It is helpful for people with gassy or bloated feelings with regular fiber o Flax Seed - a less gassy fiber than psyllium   Controlling diarrhea o Switch to liquids and simpler foods for a few days to avoid stressing your intestines further. o Avoid dairy products (especially milk & ice cream) for a short time.  The intestines often can lose the ability to digest lactose when stressed. o Avoid foods that cause gassiness or bloating.  Typical foods include beans and other legumes, cabbage, broccoli, and dairy foods.  Every person has some sensitivity to other foods, so listen to our body and avoid those foods that trigger problems for you. o Adding fiber (Citrucel, Metamucil, psyllium, Miralax) gradually can help thicken stools by absorbing excess fluid and retrain the intestines to act more normally.  Slowly increase the dose over a few weeks.  Too much fiber too soon can backfire and cause cramping & bloating. o Probiotics (such as active yogurt, Align, etc) may help repopulate the intestines and colon with normal bacteria and calm down a sensitive digestive tract.  Most studies show it to be of mild help, though, and such products can be costly. o Medicines:   Bismuth subsalicylate (ex. Kayopectate, Pepto Bismol) every 30 minutes for up to 6 doses can help control diarrhea.  Avoid if pregnant.   Loperamide (Immodium)  can slow down diarrhea.  Start with two tablets (4mg  total) first and then try one tablet every 6 hours.  Avoid if you are having fevers or severe pain.  If you are not better or start feeling worse, stop all medicines and call your doctor for advice o Call your doctor if you are getting worse or not better.  Sometimes further testing (cultures, endoscopy, X-ray studies, bloodwork, etc) may be needed to help diagnose and treat the cause of the diarrhea.    For your  colonoscopy:  [facility]  ONE-DAY (1) PRE-OP HOME COLON PREP INSTRUCTIONS: ** MIRALAX / GATORADE PREP **  You must follow the instructions below carefully.  If you have questions or problems, please call and speak to someone in the clinic department at our office:   [phone].     INSTRUCTIONS: 1. Five days prior to your procedure do not eat nuts, popcorn, or fruit with seeds.  Stop all fiber supplements such as Metamucil, Citrucel, etc. 2. Two days before surgery fill the prescription at a pharmacy of your choice and purchase the additional supplies below.         MIRALAX - GATORADE -- DULCOLAX TABS:   Purchase a bottle of MIRALAX  (255 gm bottle)    In addition, purchase four (4) DULCOLAX TABLETS (no prescription required- ask the pharmacist if you can't find them)    Purchase one 64 oz GATORADE.  (Do NOT purchase red Gatorade; any other flavor is acceptable) and place in refrigerator to get cold.  3.   Day Before Surgery:   6 am: take the 4 Dulcolax tablets   You may only have clear liquids (tea, coffee, juice, broth, jello, soft drinks, gummy bears).  You cannot have solid foods, cream, milk or milk products.  Drink at lease 8 ounces of liquids every hour while awake.   Mix the entire bottle of MiraLax and the Gatorade in a large container.    10:00am: Begin drinking the Gatorade mixture until gone (8 oz every 15-30 minutes).      You may suck on a lime wedge or hard candy to "freshen your palate" in between glasses   If you are a diabetic, take your blood sugar reading several time throughout the prep.  Have some juice available to take if your sugar level gets too low   You may feel chilled while taking the prep.  Have some warm tea or broth to help warm up.   Continue clear liquids until midnight or bedtime  3. The day of your procedure:   Do not eat or drink ANYTHING after midnight before your surgery.     If you take Heart or Blood Pressure medicine, ask the pre-op  nurses about these during your preop appointment.   Further pre-operative instructions will be given to you from the hospital.   Expect to be contacted 5-7 days before your surgery.

## 2012-02-12 ENCOUNTER — Encounter (HOSPITAL_COMMUNITY): Payer: Self-pay

## 2012-02-12 ENCOUNTER — Ambulatory Visit (HOSPITAL_COMMUNITY)
Admission: RE | Admit: 2012-02-12 | Discharge: 2012-02-12 | Disposition: A | Discharge status: Home / Self Care | Payer: No Typology Code available for payment source | Source: Ambulatory Visit | Attending: General Surgery | Admitting: General Surgery

## 2012-02-12 ENCOUNTER — Encounter (HOSPITAL_COMMUNITY)
Admission: RE | Disposition: A | Discharge status: Home / Self Care | Payer: Self-pay | Source: Ambulatory Visit | Attending: General Surgery

## 2012-02-12 DIAGNOSIS — D128 Benign neoplasm of rectum: Secondary | ICD-10-CM | POA: Insufficient documentation

## 2012-02-12 DIAGNOSIS — D126 Benign neoplasm of colon, unspecified: Secondary | ICD-10-CM

## 2012-02-12 DIAGNOSIS — K625 Hemorrhage of anus and rectum: Secondary | ICD-10-CM | POA: Insufficient documentation

## 2012-02-12 DIAGNOSIS — D129 Benign neoplasm of anus and anal canal: Secondary | ICD-10-CM | POA: Insufficient documentation

## 2012-02-12 DIAGNOSIS — A630 Anogenital (venereal) warts: Secondary | ICD-10-CM | POA: Insufficient documentation

## 2012-02-12 DIAGNOSIS — K648 Other hemorrhoids: Secondary | ICD-10-CM | POA: Insufficient documentation

## 2012-02-12 DIAGNOSIS — Z800 Family history of malignant neoplasm of digestive organs: Secondary | ICD-10-CM | POA: Insufficient documentation

## 2012-02-12 HISTORY — DX: Anxiety disorder, unspecified: F41.9

## 2012-02-12 HISTORY — DX: Major depressive disorder, single episode, unspecified: F32.9

## 2012-02-12 HISTORY — DX: Depression, unspecified: F32.A

## 2012-02-12 HISTORY — DX: Human immunodeficiency virus (HIV) disease: B20

## 2012-02-12 HISTORY — DX: Asymptomatic human immunodeficiency virus (hiv) infection status: Z21

## 2012-02-12 HISTORY — DX: Essential (primary) hypertension: I10

## 2012-02-12 HISTORY — PX: COLONOSCOPY: SHX5424

## 2012-02-12 SURGERY — COLONOSCOPY
Anesthesia: Moderate Sedation

## 2012-02-12 MED ORDER — MIDAZOLAM HCL 10 MG/2ML IJ SOLN
INTRAMUSCULAR | Status: DC | PRN
  Administered 2012-02-12 (×3): 2 mg via INTRAVENOUS
  Administered 2012-02-12 (×2): 1 mg via INTRAVENOUS

## 2012-02-12 MED ORDER — DIPHENHYDRAMINE HCL 50 MG/ML IJ SOLN
INTRAMUSCULAR | Status: AC
  Filled 2012-02-12: qty 1

## 2012-02-12 MED ORDER — FENTANYL CITRATE 0.05 MG/ML IJ SOLN
INTRAMUSCULAR | Status: AC
  Filled 2012-02-12: qty 4

## 2012-02-12 MED ORDER — SODIUM CHLORIDE 0.9 % IV SOLN
INTRAVENOUS | Status: DC
  Administered 2012-02-12: 500 mL via INTRAVENOUS

## 2012-02-12 MED ORDER — MIDAZOLAM HCL 10 MG/2ML IJ SOLN
INTRAMUSCULAR | Status: AC
  Filled 2012-02-12: qty 4

## 2012-02-12 MED ORDER — FENTANYL CITRATE 0.05 MG/ML IJ SOLN
INTRAMUSCULAR | Status: DC | PRN
  Administered 2012-02-12 (×4): 25 ug via INTRAVENOUS

## 2012-02-12 NOTE — Op Note (Signed)
[facility] [address] [place], [address]   COLONOSCOPY PROCEDURE REPORT  PATIENT: Ryan Cruz, Ryan Cruz  MR#: [id number] BIRTHDATE: 12-03-1965 , 46  yrs. old GENDER: Male ENDOSCOPIST: [name], MD REFERRED [id number], Tinnie Gens PROCEDURE DATE:  02/12/2012 PROCEDURE:   Colonoscopy, diagnostic ASA CLASS:   Class II INDICATIONS:Rectal Bleeding. MEDICATIONS: Fentanyl 100 mcg IV and Versed 8 mg IV  DESCRIPTION OF PROCEDURE:   After the risks benefits and alternatives of the procedure were thoroughly explained, informed consent was obtained.  The Pentax Colonoscope [id number]  endoscope was introduced through the anus and advanced to the cecum, which was identified by both the appendix and ileocecal valve , limited by No adverse events experienced.   The quality of the prep was excellent. .  The instrument was then slowly withdrawn as the colon was fully examined.       FINDINGS:  Rectosigmoid polyps- 4 resected and retrieved via cold forceps.  Internal hemorrhoids, nonbleeding  COMPLICATIONS: none  IMPRESSION:  Rectosigmoid polyps- await pathology  RECOMMENDATIONS: High fiber diet   _______________________________ eSigned:  [name], MD 02/12/2012 8:54 AM   [id number] Ninetta Lights, MD

## 2012-02-12 NOTE — Op Note (Signed)
COLONOSCOPY PROCEDURE REPORT  PATIENT: Ryan Cruz MR# [id number] BIRTHDATE: 07-11-1965, 47 y.o.  GENDER:  male ENDOSCOPIST: [name], MD REF. BY: Doralee Albino, M.D.  PROCEDURE DATE:  02/12/2012 PROCEDURE: Colonoscopy  ASA CLASS: 2 INDICATIONS:   Rectal bleeding   MEDICATIONS:  Fentanyl 100 mcg IV, These medications were titrated to patient response per physician's verbal order, Versed 8 mg IV  DESCRIPTION OF PROCEDURE:   After the risks benefits and alternatives of the procedure were thoroughly explained, informed consent was obtained.  Digital rectal exam was performed and revealed no rectal masses but perianal condyloma.   The adult endoscope was introduced through the anus and advanced to the cecum, which was identified by both the appendix and ileocecal valve, without limitations.  The quality of the prep was excellent.  The instrument was then slowly withdrawn as the colon was fully examined.  There were several 1-25mm benign appearing polyps in the recto-sigmoid area.  4 of these were completely removed via the cold forceps and sent to pathology for further examination.  Pictures were taken of the ileocecal valve and appendiceal orifice.        FINDINGS: rectosigmoid polyps- resected and retrieved  COMPLICATIONS: None  ENDOSCOPIC IMPRESSION:  Rectosigmoid polyps   Anal Condyloma  RECOMMENDATIONS:  1) If the polyp(s) removed today are proven to be adenomatous (pre-cancerous) polyps, you will need a repeat colonoscopy in 5 years. Otherwise you should continue to follow colorectal cancer screening guidelines for "routine risk" patients with colonoscopy in 10 years. You will receive a letter within 1-2 weeks with the results of your biopsy as well as final recommendations. Please call my office if you have not received a letter after 3 weeks.   _______________________________ [name], MD  Colorectal and General Surgery [facility]

## 2012-02-12 NOTE — H&P (View-Only) (Signed)
Chief Complaint  Patient presents with  . New Evaluation    anal warts    HISTORY: Ryan Cruz is a 47 y.o. male who presents to the office with rectal bleeding and pain.  Other symptoms include diarrhea and a perianal mass.  He has tried prep H and suppositories in the past with some success.  Hard stools makes the symptoms worse.  This had been occurring for a couple weeks.  It is intermittent in nature.  His bowel habits are regular and his bowel movements are soft.  His fiber intake is low.  He has never had a colonoscopy.  He has a family history of some uncles with colon cancer in their 19's.   No past medical history on file.   HIV  No past surgical history on file.     Umbilical hernia repair as a child  Current Outpatient Prescriptions  Medication Sig Dispense Refill  . efavirenz-emtricitabine-tenofovir (ATRIPLA) 600-200-300 MG per tablet Take 1 tablet by mouth at bedtime.  90 tablet  5  . FLUoxetine (PROZAC) 10 MG tablet Take 1 tablet (10 mg total) by mouth daily.  90 tablet  3  . HYDROcodone-acetaminophen (NORCO/VICODIN) 5-325 MG per tablet Take 1 tablet by mouth every 8 (eight) hours as needed for pain.  30 tablet  1  . lisinopril (PRINIVIL,ZESTRIL) 5 MG tablet Take 2 tablets (10 mg total) by mouth daily.  90 tablet  3  . imiquimod (ALDARA) 5 % cream Apply topically 3 (three) times a week. Apply to affected area m-w-f at bedtime. Wash off in am           No Known Allergies    Family History  Problem Relation Age of Onset  . Diabetes Mother   . Hypertension Mother   . Heart disease Mother   . Diabetes Father     History   Social History  . Marital Status: Married    Spouse Name: N/A    Number of Children: N/A  . Years of Education: N/A   Social History Main Topics  . Smoking status: Current Every Day Smoker -- 0.3 packs/day for 15 years    Types: Cigarettes  . Smokeless tobacco: Never Used  . Alcohol Use: 1.2 oz/week    1 Glasses of wine, 1 Shots of liquor  per week     Comment: occasional  . Drug Use: Yes    Special: Marijuana     Comment: marijuana  . Sexually Active: Yes -- Male partner(s)     Comment: pt. declined condoms   Other Topics Concern  . None   Social History Narrative  . None      REVIEW OF SYSTEMS - PERTINENT POSITIVES ONLY: Review of Systems - General ROS: negative for - chills or fever Hematological and Lymphatic ROS: negative for - bleeding problems or blood clots Respiratory ROS: no cough, shortness of breath, or wheezing Cardiovascular ROS: no chest pain or dyspnea on exertion Gastrointestinal ROS: negative for - abdominal pain or appetite loss  EXAM: Filed Vitals:   02/01/12 1035  BP: 128/74  Pulse: 61  Temp: 98 F (36.7 C)  Resp: 18   General appearance: alert and cooperative Resp: clear to auscultation bilaterally Cardio: regular rate and rhythm GI: soft, non-tender; bowel sounds normal; no masses,  no organomegaly  Procedure: Anoscopy Surgeon: Ryan Cruz Diagnosis: rectal pain, bleeding  Assistant: Ryan Cruz After the risks and benefits were explained, verbal consent was obtained for above procedure  Anesthesia: none Findings: moderate  internal hemorrhoids, no skin tags, no abscess, no signs of fistula, tender to palpation on right side of anal canal, no condyloma  LABORATORY RESULTS: Available labs are reviewed  Last CD4 >500    ASSESSMENT AND PLAN: Ryan Cruz is a 47 y.o. M who presents to me with anal pain and rectal bleeding.  Anoscopy only revealed hemorrhoids.  I suspect he had a thrombosed hemorrhoid or possibly a small abscess that has since resolved.  He has a family history of colon cancer and with the rectal bleeding, I would like him to get a colonoscopy.  For his hemorrhoids, I have instructed him to increase his fiber intake.   Ryan Panda, MD Colon and Rectal Surgery / General Surgery [facility]      Visit Diagnoses: 1. Hemorrhoid     Primary  Care Physician: Ryan Sax, MD

## 2012-02-12 NOTE — Interval H&P Note (Signed)
History and Physical Interval Note:  02/12/2012 7:36 AM  Ryan Cruz  has presented today for surgery, with the diagnosis of rectal bleeding  The various methods of treatment have been discussed with the patient and family. After consideration of risks, benefits and other options for treatment, the patient has consented to  Procedure(s) (LRB) with comments: COLONOSCOPY (N/A) as a surgical intervention .  The patient's history has been reviewed, patient examined, no change in status, stable for surgery.  I have reviewed the patient's chart and labs.  Questions were answered to the patient's satisfaction.     [name]

## 2012-02-15 ENCOUNTER — Encounter (HOSPITAL_COMMUNITY): Payer: Self-pay | Admitting: General Surgery

## 2012-02-19 ENCOUNTER — Encounter (INDEPENDENT_AMBULATORY_CARE_PROVIDER_SITE_OTHER): Payer: Self-pay | Admitting: General Surgery

## 2012-02-26 ENCOUNTER — Ambulatory Visit (INDEPENDENT_AMBULATORY_CARE_PROVIDER_SITE_OTHER): Payer: PRIVATE HEALTH INSURANCE | Admitting: General Surgery

## 2012-02-26 VITALS — BP 132/68 | HR 68 | Temp 98.0°F | Resp 18 | Ht 68.0 in | Wt 136.0 lb

## 2012-02-26 DIAGNOSIS — K6289 Other specified diseases of anus and rectum: Secondary | ICD-10-CM

## 2012-02-26 NOTE — Patient Instructions (Addendum)
Take colace 100mg  twice a day.  Continue your high fiber diet and drink plenty of fluids.  Sit in a warm bath 3 times a day.  Call the office if your symptoms persist, worsen or you begin to have fevers.   WHAT IS AN ANAL FISSURE? An anal fissure (fissure-in-ano) is a small, oval shaped tear in skin that lines the opening of the anus. Fissures typically cause severe pain and bleeding with bowel movements. Fissures are quite common in the general population, but are often confused with other causes of pain and bleeding, such as hemorrhoids. WHAT ARE THE SYMPTOMS OF AN ANAL FISSURE? The typical symptoms of an anal fissure include severe pain during, and especially after, a bowel movement, lasting from several minutes to a few hours. Patients may also notice bright red blood from the anus that can be seen on the toilet paper or on the stool. Between bowel movements, patients with anal fissures are often relatively symptom-free. Many patients are fearful of having a bowel movement and may try to avoid defecation secondary to the pain.  WHAT CAUSES AN ANAL FISSURE? Fissures are usually caused by trauma to the inner lining of the anus. Patients with tight anal sphincter muscles (i.e., increased muscle tone) are more prone to developing anal fissures. A hard, dry bowel movement is typically responsible, but loose stools and diarrhea can also be the cause. Following a bowel movement, severe anal pain can produce spasm of the anal sphincter muscle, resulting in a decrease in blood flow to the site of the injury, thus impairing healing of the wound. The next bowel movement results in more pain, anal spasm, decreased blood flow to the area, and the cycle continues. Treatments are aimed at interrupting this cycle by relaxing the anal sphincter muscle to promote healing of the fissure.  Other, less common, causes include inflammatory conditions and certain anal infections or tumors. Anal fissures may be acute (recent  onset) or chronic (present for a long period of time). Chronic fissures may be more difficult to treat, and may also have an external lump associated with the tear, called a sentinel pile or skin tag, as well as extra tissue just inside the anal canal (hypertrophied papilla) . WHAT IS THE TREATMENT OF ANAL FISSURES? The majority of anal fissures do not require surgery. The most common treatment for an acute anal fissure consists of making the stool more formed and bulky with a diet high in fiber and utilization of over-the-counter fiber supplementation (totaling 25-35 grams of fiber/day). Stool softeners and increasing water intake may be necessary to promote soft bowel movements and aid in the healing process. Topical anesthetics for pain and warm tub baths (sitz baths) for 10-20 minutes several times a day (especially after bowel movements) are soothing and promote relaxation of the anal muscles, which may help the healing process.  Other medications (such as nitroglycerin, nifedipine, or diltiazem) may be prescribed that allow relaxation of the anal sphincter muscles. Your surgeon will go over benefits and side-effects of each of these with you. Narcotic pain medications are not recommended for anal fissures, as they promote constipation. Chronic fissures are generally more difficult to treat, and your surgeon may advise surgical treatment. WILL THE PROBLEM RETURN? Fissures can recur easily, and it is quite common for a fully healed fissure to recur after a hard bowel movement or other trauma. Even when the pain and bleeding have subsided, it is very important to continue good bowel habits and a diet high  in fiber as a lifestyle change. If the problem returns without an obvious cause, further assessment is warranted. WHAT CAN BE DONE IF THE FISSURE DOES NOT HEAL? A fissure that fails to respond to conservative measures should be re-examined. Persistent hard or loose bowel movements, scarring, or spasm of  the internal anal muscle all contribute to delayed healing. Other medical problems such as inflammatory bowel disease (Crohn's disease), infections, or anal tumors can cause symptoms similar to anal fissures. Patients suffering from persistent anal pain should be examined to exclude these symptoms. This may include a colonoscopy or an exam in the operating room under anesthesia. WHAT DOES SURGERY INVOLVE? Surgical options for treating anal fissure include Botulinum toxin (Botox) injection into the anal sphincter and surgical division of a portion of the internal anal sphincter (lateral internal sphincterotomy). Both of these are performed typically as outpatient, same-day procedures, or occasionally in the office setting. The goal of these surgical options is to promote relaxation of the anal sphincter, thereby decreasing anal pain and spasm, allowing the fissure to heal. Botox injection results in healing in 50-80% of patients, while sphincterotomy is reported to be over 90% successful. If a sentinel pile is present, it may be removed to promote healing of the fissure. All surgical procedures carry some risk, and a sphincterotomy can rarely interfere with one's ability to control gas and stool. Your colon and rectal surgeon will discuss these risks with you to determine the appropriate treatment for your particular situation. HOW LONG IS THE RECOVERY AFTER SURGERY? It is important to note that complete healing with both medical and surgical treatments can take up to approximately 6-10 weeks. However, acute pain after surgery often disappears after a few days. Most patients will be able to return to work and resume daily activities in a few short days after the surgery. CAN FISSURES LEAD TO COLON CANCER? Absolutely not. Persistent symptoms, however, need careful evaluation since other conditions other than an anal fissure can cause similar symptoms. Your colon and rectal surgeon may request additional tests,  even if your fissure has successfully healed. A colonoscopy may be required to exclude other causes of rectal bleeding. WHAT IS A COLON AND RECTAL SURGEON? Colon and rectal surgeons are experts in the surgical and non-surgical treatment of diseases of the colon, rectum and anus. They have completed advanced surgical training in the treatment of these diseases as well as full general surgical training. Board-certified colon and rectal surgeons complete residencies in general surgery and colon and rectal surgery, and pass intensive examinations conducted by the American Board of Surgery and the American Board of Colon and Rectal Surgery. They are well-versed in the treatment of both benign and malignant diseases of the colon, rectum and anus and are able to perform routine screening examinations and surgically treat conditions if indicated to do so.  Author: Tyrone Apple. Pennie Banter, DO, on behalf of the [employer]   2012 American Society of Colon & Rectal Surgeons

## 2012-02-26 NOTE — Progress Notes (Signed)
Ryan Cruz is a 47 y.o. male who is here for acute onset of anal pain.  This started on Mon after a large BM on Sun.  He describes the pain as sharp during BM's and then dull aching afterwards for several hours.  He has some minimal bleeding on the toilet paper as well.   Objective: Filed Vitals:   02/26/12 1445  BP: 132/68  Pulse: 68  Temp: 98 F (36.7 C)  Resp: 18    General appearance: alert and cooperative GI: soft, non-tender; bowel sounds normal; no masses,  no organomegaly Anal exam: no fluctuant masses, no hemorrhoidal disease, tolerated DRE, tender to palpation posteriorly, unable to visualize a fissure  Assessment and Plan: He appears to have a posterior anal fissure.  I could not rule out intersphincteric abscess completely, although I think it is much more likely that this is a fissure.  I have asked him to start sitz baths, stool softeners and to continue his high fiber diet.  If the pain gets worse, he was instructed to call the office, so we could set him up for an anal EUA.      Marland KitchenVanita Panda, MD [facility], [place] [phone]

## 2012-03-01 ENCOUNTER — Encounter (INDEPENDENT_AMBULATORY_CARE_PROVIDER_SITE_OTHER): Payer: PRIVATE HEALTH INSURANCE | Admitting: General Surgery

## 2012-03-09 ENCOUNTER — Ambulatory Visit: Payer: Self-pay | Admitting: Infectious Diseases

## 2012-03-09 ENCOUNTER — Telehealth: Payer: Self-pay | Admitting: *Deleted

## 2012-03-09 NOTE — Telephone Encounter (Signed)
Called patient and left voice mail to call the clinic to reschedule lab and follow up appt, he no showed today. Ryan Cruz

## 2012-03-15 ENCOUNTER — Encounter: Payer: Self-pay | Admitting: Infectious Diseases

## 2012-04-13 ENCOUNTER — Ambulatory Visit: Payer: No Typology Code available for payment source

## 2012-06-14 ENCOUNTER — Other Ambulatory Visit: Payer: Self-pay

## 2012-06-14 ENCOUNTER — Other Ambulatory Visit (INDEPENDENT_AMBULATORY_CARE_PROVIDER_SITE_OTHER): Payer: No Typology Code available for payment source

## 2012-06-14 DIAGNOSIS — Z113 Encounter for screening for infections with a predominantly sexual mode of transmission: Secondary | ICD-10-CM

## 2012-06-14 DIAGNOSIS — B20 Human immunodeficiency virus [HIV] disease: Secondary | ICD-10-CM

## 2012-06-14 DIAGNOSIS — Z79899 Other long term (current) drug therapy: Secondary | ICD-10-CM

## 2012-06-14 LAB — RPR

## 2012-06-14 LAB — COMPREHENSIVE METABOLIC PANEL
ALT: 20 U/L (ref 0–53)
AST: 18 U/L (ref 0–37)
Alkaline Phosphatase: 57 U/L (ref 39–117)
CO2: 23 mEq/L (ref 19–32)
Creat: 1.13 mg/dL (ref 0.50–1.35)
Sodium: 138 mEq/L (ref 135–145)
Total Bilirubin: 0.4 mg/dL (ref 0.3–1.2)
Total Protein: 7.1 g/dL (ref 6.0–8.3)

## 2012-06-14 LAB — CBC
MCH: 30.9 pg (ref 26.0–34.0)
MCHC: 34 g/dL (ref 30.0–36.0)
MCV: 91 fL (ref 78.0–100.0)
Platelets: 221 10*3/uL (ref 150–400)
RBC: 4.01 MIL/uL — ABNORMAL LOW (ref 4.22–5.81)
RDW: 14.6 % (ref 11.5–15.5)

## 2012-06-14 LAB — LIPID PANEL
LDL Cholesterol: 121 mg/dL — ABNORMAL HIGH (ref 0–99)
Total CHOL/HDL Ratio: 4.3 Ratio
VLDL: 15 mg/dL (ref 0–40)

## 2012-06-15 LAB — T-HELPER CELL (CD4) - (RCID CLINIC ONLY)
CD4 % Helper T Cell: 30 % — ABNORMAL LOW (ref 33–55)
CD4 T Cell Abs: 460 uL (ref 400–2700)

## 2012-06-16 LAB — HIV-1 RNA QUANT-NO REFLEX-BLD: HIV 1 RNA Quant: <20 copies/mL (ref <20)

## 2012-06-29 ENCOUNTER — Ambulatory Visit (INDEPENDENT_AMBULATORY_CARE_PROVIDER_SITE_OTHER): Payer: No Typology Code available for payment source | Admitting: Infectious Diseases

## 2012-06-29 ENCOUNTER — Encounter: Payer: Self-pay | Admitting: Infectious Diseases

## 2012-06-29 VITALS — BP 142/90 | HR 84 | Temp 98.5°F | Ht 67.0 in | Wt 141.0 lb

## 2012-06-29 DIAGNOSIS — B20 Human immunodeficiency virus [HIV] disease: Secondary | ICD-10-CM

## 2012-06-29 DIAGNOSIS — A630 Anogenital (venereal) warts: Secondary | ICD-10-CM

## 2012-06-29 DIAGNOSIS — J309 Allergic rhinitis, unspecified: Secondary | ICD-10-CM

## 2012-06-29 NOTE — Progress Notes (Signed)
  Subjective:    Patient ID: Ryan Cruz, male    DOB: 30-Dec-1965, 47 y.o.   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 Has been taking atripla. 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 Also hx of headaches, abd wall hernia and anal warts. Had colonoscopy for anal bleeding Jan 2014 (4 rectosigmoid polyps (path benign) internal hemmerhoids). Occas doing sitz Occas doing sitz baths. Rarely has BRBPR.  Has some R ear pain, decreased hearing. Having issues with pollen. Occas green sputum.  HIV 1 RNA Quant (copies/mL)  Date Value  06/14/2012 <20   12/03/2011 <20   03/13/2011 <20      CD4 T Cell Abs (cmm)  Date Value  06/14/2012 460   12/03/2011 540   03/13/2011 730       Review of Systems  Constitutional: Negative for fever, chills, appetite change and unexpected weight change.  HENT: Positive for ear pain.   Respiratory: Positive for cough.   Gastrointestinal: Negative for diarrhea and constipation.  Genitourinary: Negative for difficulty urinating.  Musculoskeletal: Positive for arthralgias.       Objective:   Physical Exam  Constitutional: He appears well-developed and well-nourished.  HENT:  Right Ear: No foreign bodies. Tympanic membrane is not injected. No middle ear effusion.  Left Ear: No foreign bodies. Tympanic membrane is not injected.  No middle ear effusion.  Ears:  Mouth/Throat: No oropharyngeal exudate.  Eyes: EOM are normal. Pupils are equal, round, and reactive to light.  Neck: Neck supple.  Cardiovascular: Normal rate, regular rhythm and normal heart sounds.   Pulmonary/Chest: Effort normal and breath sounds normal.  Abdominal: Soft. Bowel sounds are normal. There is no tenderness. There is no rebound.  Lymphadenopathy:    He has no cervical adenopathy.          Assessment & Plan:

## 2012-06-29 NOTE — Assessment & Plan Note (Signed)
Has been eval by Dr Maisie Fus (greatly appreciate). There was no mention of warts on his exam. Pt states that these have resolved.

## 2012-06-29 NOTE — Assessment & Plan Note (Signed)
I suggested he try OTC claritin or zyrtec for this. He could try the "D" forms but I suggested not to use these for more than 24h.

## 2012-06-29 NOTE — Assessment & Plan Note (Addendum)
He is doing very well. Will continue his current ART (atripla). He is offered/refused condoms. His vaccines are up to date. Will see him back in 6 months.

## 2012-09-26 ENCOUNTER — Telehealth: Payer: Self-pay | Admitting: *Deleted

## 2012-09-26 ENCOUNTER — Other Ambulatory Visit: Payer: Self-pay | Admitting: *Deleted

## 2012-09-26 ENCOUNTER — Ambulatory Visit (INDEPENDENT_AMBULATORY_CARE_PROVIDER_SITE_OTHER): Payer: No Typology Code available for payment source | Admitting: Infectious Diseases

## 2012-09-26 ENCOUNTER — Encounter: Payer: Self-pay | Admitting: Infectious Diseases

## 2012-09-26 VITALS — BP 169/104 | HR 67 | Temp 98.3°F | Wt 147.0 lb

## 2012-09-26 DIAGNOSIS — K625 Hemorrhage of anus and rectum: Secondary | ICD-10-CM

## 2012-09-26 DIAGNOSIS — L0591 Pilonidal cyst without abscess: Secondary | ICD-10-CM

## 2012-09-26 DIAGNOSIS — B20 Human immunodeficiency virus [HIV] disease: Secondary | ICD-10-CM

## 2012-09-26 MED ORDER — PE-SHARK LIVER OIL-COCOA BUTTR 0.25-3-85.5 % RE SUPP: 1.0000 | RECTAL | Status: DC | PRN

## 2012-09-26 MED ORDER — HYDROCODONE-ACETAMINOPHEN 5-325 MG PO TABS: 1.0000 | ORAL_TABLET | Freq: Three times a day (TID) | ORAL | Status: DC | PRN

## 2012-09-26 NOTE — Telephone Encounter (Signed)
Pt called requesting an appointment. States he has has bright red blood per rectum with stools x 2 weeks, increasing pain.  Pt states he can be in the office anytime. Pt given appointment for this morning at 10:15. Dr. Ninetta Lights informed. Andree Coss, RN

## 2012-09-26 NOTE — Assessment & Plan Note (Signed)
Appears to be doing well.  HIV 1 RNA Quant (copies/mL)  Date Value  06/14/2012 <20   12/03/2011 <20   03/13/2011 <20      CD4 T Cell Abs (cmm)  Date Value  06/14/2012 460   12/03/2011 540   03/13/2011 730

## 2012-09-26 NOTE — Assessment & Plan Note (Signed)
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 Will ask him to use preperation H suppositories.

## 2012-09-26 NOTE — Progress Notes (Signed)
  Subjective:    Patient ID: Ryan Cruz, male    DOB: [birth year], 47 y.o.   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 Has been taking atripla. Lisinopril. 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 Also hx of headaches, abd wall hernia and anal warts. Had colonoscopy for anal bleeding Jan 2014 (4 rectosigmoid polyps (path benign) internal hemmerhoids). Has not had $ to f/u and get further eval of his BRBPR and rectal warts.  Here for urgent visit for rectal bleeding- bright blood, not dark- pt believes it is bleeding hemmerroid, was able to push something back in.  C/o rectal pain.  No problems with ART.    Review of Systems  Gastrointestinal: Positive for hematochezia.       Objective:   Physical Exam  Constitutional: He appears well-developed and well-nourished.  Genitourinary: Rectal exam shows tenderness. Rectal exam shows no external hemorrhoid, no fissure, no mass and anal tone normal.             Assessment & Plan:

## 2012-09-27 ENCOUNTER — Telehealth: Payer: Self-pay | Admitting: *Deleted

## 2012-09-27 ENCOUNTER — Other Ambulatory Visit: Payer: Self-pay | Admitting: Infectious Diseases

## 2012-09-27 DIAGNOSIS — K603 Anal fistula: Secondary | ICD-10-CM

## 2012-09-27 NOTE — Telephone Encounter (Signed)
Patient notified he has an appt with Dr. Matthias Hughs at [facility] for 10/25/12 at 3:00 pm. Office notes faxed to [phone]. Wendall Mola

## 2012-10-05 ENCOUNTER — Ambulatory Visit: Payer: No Typology Code available for payment source

## 2012-12-12 ENCOUNTER — Other Ambulatory Visit: Payer: Self-pay

## 2012-12-19 ENCOUNTER — Other Ambulatory Visit: Payer: Self-pay

## 2013-01-02 ENCOUNTER — Ambulatory Visit: Payer: Self-pay | Admitting: Infectious Diseases

## 2013-01-02 ENCOUNTER — Telehealth: Payer: Self-pay | Admitting: *Deleted

## 2013-01-02 NOTE — Telephone Encounter (Signed)
Left message informing pt of missed visit.  Asked him to contact us for rescheduling. Andree Coss, RN

## 2013-01-12 ENCOUNTER — Telehealth: Payer: Self-pay | Admitting: Licensed Clinical Social Worker

## 2013-01-12 ENCOUNTER — Ambulatory Visit (INDEPENDENT_AMBULATORY_CARE_PROVIDER_SITE_OTHER): Payer: Self-pay | Admitting: Infectious Disease

## 2013-01-12 ENCOUNTER — Encounter: Payer: Self-pay | Admitting: Infectious Disease

## 2013-01-12 VITALS — BP 143/87 | HR 67 | Temp 98.6°F | Wt 135.0 lb

## 2013-01-12 DIAGNOSIS — Z113 Encounter for screening for infections with a predominantly sexual mode of transmission: Secondary | ICD-10-CM

## 2013-01-12 DIAGNOSIS — Z23 Encounter for immunization: Secondary | ICD-10-CM

## 2013-01-12 DIAGNOSIS — B20 Human immunodeficiency virus [HIV] disease: Secondary | ICD-10-CM

## 2013-01-12 DIAGNOSIS — I10 Essential (primary) hypertension: Secondary | ICD-10-CM

## 2013-01-12 LAB — CBC WITH DIFFERENTIAL/PLATELET
Basophils Absolute: 0 10*3/uL (ref 0.0–0.1)
Basophils Relative: 0 % (ref 0–1)
Eosinophils Absolute: 0.1 10*3/uL (ref 0.0–0.7)
Hemoglobin: 12.2 g/dL — ABNORMAL LOW (ref 13.0–17.0)
MCH: 31.9 pg (ref 26.0–34.0)
MCHC: 35.1 g/dL (ref 30.0–36.0)
Neutro Abs: 2.8 10*3/uL (ref 1.7–7.7)
Neutrophils Relative %: 57 % (ref 43–77)
Platelets: 263 10*3/uL (ref 150–400)
RDW: 14.6 % (ref 11.5–15.5)

## 2013-01-12 LAB — COMPLETE METABOLIC PANEL WITH GFR
Albumin: 4.6 g/dL (ref 3.5–5.2)
Alkaline Phosphatase: 61 U/L (ref 39–117)
BUN: 11 mg/dL (ref 6–23)
CO2: 26 mEq/L (ref 19–32)
GFR, Est African American: >89 mL/min
GFR, Est Non African American: 88 mL/min
Glucose, Bld: 90 mg/dL (ref 70–99)
Potassium: 3.6 mEq/L (ref 3.5–5.3)
Sodium: 140 mEq/L (ref 135–145)
Total Protein: 7.8 g/dL (ref 6.0–8.3)

## 2013-01-12 LAB — RPR

## 2013-01-12 MED ORDER — EFAVIRENZ-EMTRICITAB-TENOFOVIR 600-200-300 MG PO TABS: 1.0000 | ORAL_TABLET | Freq: Every day | ORAL | Status: DC

## 2013-01-12 MED ORDER — LISINOPRIL 20 MG PO TABS: 20.0000 mg | ORAL_TABLET | Freq: Every day | ORAL | Status: DC

## 2013-01-12 NOTE — Progress Notes (Signed)
Subjective:    Patient ID: Ryan Cruz, male    DOB: August 28, 1965, 47 y.o.   MRN: [id number]  HPI  Ryan Cruz is a 47 y.o. male with HIV infection who is doing superbly well on his  antiviral regimen, Atripla with undetectable viral load and health cd4 count.   He comes to clinic for routine followup visit. His labs when last checked in May showed an undetectable viral load and healthy CD4 count he states he has missed approximately 2 doses of Atripla the last month he's been getting Atripla through the [facility] patient assistance. He has reenrolled in the AIDS drug assistance program he is on lisinopril 10 mg a day states at times his blood pressure goes up in the 190s over 1 teens while at home and this causes him to have headaches as typically happens 5 PM and 7 PM in the evening. We spent time reviewing his family history which is fairly extensive for diabetes mellitus.  I also discussed other) is single tablet regimens that might help him avoid feeling foggy headed when he wakes up in the middle the night and also avoid the Midrin she continues to suffer from with his Atripla.   Review of Systems  Constitutional: Negative for fever, chills, diaphoresis, activity change, appetite change, fatigue and unexpected weight change.  HENT: Negative for congestion, rhinorrhea, sinus pressure, sneezing, sore throat and trouble swallowing.   Eyes: Negative for photophobia and visual disturbance.  Respiratory: Negative for cough, chest tightness, shortness of breath, wheezing and stridor.   Cardiovascular: Negative for chest pain, palpitations and leg swelling.  Gastrointestinal: Negative for nausea, vomiting, abdominal pain, diarrhea, constipation, blood in stool, abdominal distention and anal bleeding.  Genitourinary: Negative for dysuria, hematuria, flank pain and difficulty urinating.  Musculoskeletal: Negative for arthralgias, back pain, gait problem, joint swelling and myalgias.  Skin: Negative  for color change, pallor, rash and wound.  Neurological: Positive for headaches. Negative for dizziness, tremors, weakness and light-headedness.  Hematological: Negative for adenopathy. Does not bruise/bleed easily.  Psychiatric/Behavioral: Positive for sleep disturbance. Negative for behavioral problems, confusion, dysphoric mood, decreased concentration and agitation.       Objective:   Physical Exam  Constitutional: He is oriented to person, place, and time. He appears well-developed and well-nourished. No distress.  HENT:  Head: Normocephalic and atraumatic.  Mouth/Throat: Oropharynx is clear and moist. No oropharyngeal exudate.  Eyes: EOM are normal. No scleral icterus.  Neck: Normal range of motion. Neck supple.  Cardiovascular: Normal rate and regular rhythm.   Pulmonary/Chest: Effort normal. No respiratory distress. He has no wheezes.  Abdominal: He exhibits no distension.  Musculoskeletal: He exhibits no edema and no tenderness.  Neurological: He is alert and oriented to person, place, and time. He exhibits normal muscle tone. Coordination normal.  Skin: Skin is warm and dry. He is not diaphoretic. No erythema. No pallor.  Psychiatric: He has a normal mood and affect. His behavior is normal. Judgment and thought content normal.          Assessment & Plan:   #1 HIV: Check viral load and CD4 count today check HLA B571. I would repeat the reluctant to give him TRIUMEQ given his HTN, his smoking though he has down quite a bit and also a strong family history of diabetes. If you stop smoking and his blood pressure be well controlled then I would consider placing him on an abacavir-based regimen. For now I've also had him take a look at  options of Complera and STRIBILD  #2 HTN: Don't like hearing about blood person the 190s over 110s accompanied by headaches. He cannot associate this with a causal stressor. Therefore I'm going up on his lisinopril 20 mg a day he'll also need repeat  metabolic panel checked next week. Also check a urine microalbumin to creatinine ratio.  #3 need for influenza vaccination flu vaccine given today.

## 2013-01-12 NOTE — Telephone Encounter (Signed)
Per Dr. Daiva Eves patient needs to come in next week for a repeat BMP, I left this on patient's voicemail.

## 2013-01-13 LAB — T-HELPER CELL (CD4) - (RCID CLINIC ONLY): CD4 % Helper T Cell: 26 % — ABNORMAL LOW (ref 33–55)

## 2013-01-15 LAB — HLA B*5701: HLA-B*5701 w/rflx HLA-B High: NEGATIVE

## 2013-01-18 ENCOUNTER — Other Ambulatory Visit: Payer: Self-pay

## 2013-02-10 ENCOUNTER — Other Ambulatory Visit: Payer: Self-pay | Admitting: *Deleted

## 2013-02-10 DIAGNOSIS — F329 Major depressive disorder, single episode, unspecified: Secondary | ICD-10-CM

## 2013-02-10 DIAGNOSIS — B20 Human immunodeficiency virus [HIV] disease: Secondary | ICD-10-CM

## 2013-02-10 DIAGNOSIS — Z23 Encounter for immunization: Secondary | ICD-10-CM

## 2013-02-10 DIAGNOSIS — I10 Essential (primary) hypertension: Secondary | ICD-10-CM

## 2013-02-10 DIAGNOSIS — F32A Depression, unspecified: Secondary | ICD-10-CM

## 2013-02-10 MED ORDER — FLUOXETINE HCL 10 MG PO TABS: 10.0000 mg | ORAL_TABLET | Freq: Every day | ORAL | Status: DC

## 2013-02-10 MED ORDER — EFAVIRENZ-EMTRICITAB-TENOFOVIR 600-200-300 MG PO TABS: 1.0000 | ORAL_TABLET | Freq: Every day | ORAL | Status: DC

## 2013-02-23 ENCOUNTER — Telehealth: Payer: Self-pay | Admitting: *Deleted

## 2013-02-23 NOTE — Telephone Encounter (Signed)
Patient called, reported feeling overwhelmed and having panic attacks, denies SI/HI.  RN gave patient first available counseling appointment (with Max Menius) and scheduled patient for follow up with Dr. Johnnye Sima.  02/28/13, 3:00 adn 4:15 respectively. RN gave patient contact information for walk-in mental health providers [facility] [place]) and the mobile crisis phone number to [facility].  Patient verbalized contract to call 911 if he feels SI/HI.   Landis Gandy, RN

## 2013-02-28 ENCOUNTER — Ambulatory Visit: Payer: Self-pay | Admitting: *Deleted

## 2013-02-28 ENCOUNTER — Encounter: Payer: Self-pay | Admitting: Infectious Diseases

## 2013-02-28 ENCOUNTER — Ambulatory Visit (INDEPENDENT_AMBULATORY_CARE_PROVIDER_SITE_OTHER): Payer: Self-pay | Admitting: Infectious Diseases

## 2013-02-28 VITALS — BP 160/120 | HR 64 | Temp 98.7°F | Ht 68.0 in | Wt 140.0 lb

## 2013-02-28 DIAGNOSIS — F329 Major depressive disorder, single episode, unspecified: Secondary | ICD-10-CM

## 2013-02-28 DIAGNOSIS — A630 Anogenital (venereal) warts: Secondary | ICD-10-CM

## 2013-02-28 DIAGNOSIS — I10 Essential (primary) hypertension: Secondary | ICD-10-CM

## 2013-02-28 DIAGNOSIS — B20 Human immunodeficiency virus [HIV] disease: Secondary | ICD-10-CM

## 2013-02-28 DIAGNOSIS — F3289 Other specified depressive episodes: Secondary | ICD-10-CM

## 2013-02-28 DIAGNOSIS — F32A Depression, unspecified: Secondary | ICD-10-CM

## 2013-02-28 DIAGNOSIS — F411 Generalized anxiety disorder: Secondary | ICD-10-CM
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
## 2013-02-28 NOTE — Assessment & Plan Note (Signed)
Repeat BP 153/91. This is better but still room for improvement, hopefully improving his stress/anxiety will improve his BP.
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
## 2013-02-28 NOTE — Assessment & Plan Note (Signed)
Will have him seen by Max again. We talked about biofeedback, breathing and relaxation exercises. I encouraged him to do this at least 10 minutes/day. Hopefully controlling this will improve his BP.

## 2013-02-28 NOTE — Assessment & Plan Note (Addendum)
He is doing very well. Will continue his current meds. Offered/refused condoms. He is hep B S Ab+. Will see him back in 3 months.

## 2013-02-28 NOTE — Assessment & Plan Note (Signed)
He states that hti sis better. Will re-approach him for anoscopy at f/u visit.

## 2013-03-02 ENCOUNTER — Ambulatory Visit: Payer: Self-pay

## 2013-03-02 DIAGNOSIS — F410 Panic disorder [episodic paroxysmal anxiety] without agoraphobia: Secondary | ICD-10-CM

## 2013-03-02 DIAGNOSIS — F4310 Post-traumatic stress disorder, unspecified: Secondary | ICD-10-CM
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
## 2013-04-11 ENCOUNTER — Ambulatory Visit: Payer: Self-pay

## 2013-04-18 ENCOUNTER — Ambulatory Visit: Payer: Self-pay

## 2013-04-25 ENCOUNTER — Ambulatory Visit: Payer: Self-pay

## 2013-04-25 ENCOUNTER — Other Ambulatory Visit: Payer: Self-pay | Admitting: *Deleted

## 2013-04-25 ENCOUNTER — Encounter: Payer: Self-pay | Admitting: *Deleted

## 2013-04-25 DIAGNOSIS — B20 Human immunodeficiency virus [HIV] disease: Secondary | ICD-10-CM

## 2013-04-25 DIAGNOSIS — I10 Essential (primary) hypertension: Secondary | ICD-10-CM

## 2013-04-25 DIAGNOSIS — Z23 Encounter for immunization: Secondary | ICD-10-CM

## 2013-04-25 MED ORDER — EFAVIRENZ-EMTRICITAB-TENOFOVIR 600-200-300 MG PO TABS: 1.0000 | ORAL_TABLET | Freq: Every day | ORAL | Status: DC

## 2013-05-11 ENCOUNTER — Other Ambulatory Visit: Payer: Self-pay

## 2013-05-31 ENCOUNTER — Encounter: Payer: Self-pay | Admitting: *Deleted

## 2013-05-31 ENCOUNTER — Ambulatory Visit: Payer: Self-pay | Admitting: Infectious Diseases

## 2013-05-31 ENCOUNTER — Telehealth: Payer: Self-pay | Admitting: *Deleted

## 2013-05-31 NOTE — Telephone Encounter (Signed)
Pt called to cancel appt this AM.

## 2013-06-14 ENCOUNTER — Ambulatory Visit: Payer: Self-pay | Admitting: Internal Medicine

## 2013-06-28 ENCOUNTER — Encounter: Payer: Self-pay | Admitting: Internal Medicine

## 2013-06-28 ENCOUNTER — Ambulatory Visit (INDEPENDENT_AMBULATORY_CARE_PROVIDER_SITE_OTHER): Payer: Self-pay | Admitting: Internal Medicine

## 2013-06-28 ENCOUNTER — Encounter: Payer: Self-pay | Admitting: *Deleted

## 2013-06-28 VITALS — BP 137/77 | HR 77 | Temp 98.8°F | Wt 138.0 lb

## 2013-06-28 DIAGNOSIS — T3111 Burns involving 10-19% of body surface with 10-19% third degree burns: Secondary | ICD-10-CM

## 2013-06-28 DIAGNOSIS — T3110 Burns involving 10-19% of body surface with 0% to 9% third degree burns: Secondary | ICD-10-CM

## 2013-06-28 MED ORDER — HYDROCODONE-ACETAMINOPHEN 5-325MG PREPACK (~~LOC~~: ORAL_TABLET | ORAL | Status: DC
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
## 2013-07-11 ENCOUNTER — Other Ambulatory Visit: Payer: Self-pay

## 2013-09-07 ENCOUNTER — Other Ambulatory Visit (INDEPENDENT_AMBULATORY_CARE_PROVIDER_SITE_OTHER): Payer: Self-pay

## 2013-09-07 DIAGNOSIS — Z79899 Other long term (current) drug therapy: Secondary | ICD-10-CM

## 2013-09-07 DIAGNOSIS — I10 Essential (primary) hypertension: Secondary | ICD-10-CM

## 2013-09-07 DIAGNOSIS — B20 Human immunodeficiency virus [HIV] disease: Secondary | ICD-10-CM

## 2013-09-07 DIAGNOSIS — Z113 Encounter for screening for infections with a predominantly sexual mode of transmission: Secondary | ICD-10-CM

## 2013-09-07 DIAGNOSIS — Z23 Encounter for immunization: Secondary | ICD-10-CM

## 2013-09-07 LAB — COMPLETE METABOLIC PANEL WITH GFR
ALK PHOS: 74 U/L (ref 39–117)
ALT: 25 U/L (ref 0–53)
AST: 18 U/L (ref 0–37)
Albumin: 4.5 g/dL (ref 3.5–5.2)
BILIRUBIN TOTAL: 0.5 mg/dL (ref 0.2–1.2)
BUN: 10 mg/dL (ref 6–23)
CO2: 26 meq/L (ref 19–32)
CREATININE: 1.11 mg/dL (ref 0.50–1.35)
Calcium: 9.3 mg/dL (ref 8.4–10.5)
Chloride: 108 mEq/L (ref 96–112)
GFR, EST NON AFRICAN AMERICAN: 79 mL/min
Glucose, Bld: 90 mg/dL (ref 70–99)
Potassium: 4.1 mEq/L (ref 3.5–5.3)
SODIUM: 139 meq/L (ref 135–145)
Total Protein: 7.4 g/dL (ref 6.0–8.3)

## 2013-09-07 LAB — LIPID PANEL
CHOL/HDL RATIO: 4 ratio
Cholesterol: 194 mg/dL (ref 0–200)
HDL: 49 mg/dL (ref >39)
LDL Cholesterol: 123 mg/dL — ABNORMAL HIGH (ref 0–99)
TRIGLYCERIDES: 109 mg/dL (ref <150)
VLDL: 22 mg/dL (ref 0–40)

## 2013-09-07 LAB — CBC WITH DIFFERENTIAL/PLATELET
Basophils Absolute: 0 10*3/uL (ref 0.0–0.1)
Basophils Relative: 1 % (ref 0–1)
Eosinophils Absolute: 0.2 10*3/uL (ref 0.0–0.7)
Eosinophils Relative: 6 % — ABNORMAL HIGH (ref 0–5)
HEMATOCRIT: 38.7 % — AB (ref 39.0–52.0)
HEMOGLOBIN: 13.4 g/dL (ref 13.0–17.0)
LYMPHS ABS: 1.4 10*3/uL (ref 0.7–4.0)
Lymphocytes Relative: 42 % (ref 12–46)
MCH: 32.4 pg (ref 26.0–34.0)
MCHC: 34.6 g/dL (ref 30.0–36.0)
MCV: 93.5 fL (ref 78.0–100.0)
MONO ABS: 0.2 10*3/uL (ref 0.1–1.0)
MONOS PCT: 7 % (ref 3–12)
NEUTROS ABS: 1.5 10*3/uL — AB (ref 1.7–7.7)
NEUTROS PCT: 44 % (ref 43–77)
Platelets: 268 10*3/uL (ref 150–400)
RBC: 4.14 MIL/uL — AB (ref 4.22–5.81)
RDW: 14.7 % (ref 11.5–15.5)
WBC: 3.4 10*3/uL — ABNORMAL LOW (ref 4.0–10.5)

## 2013-09-07 LAB — PHOSPHORUS: Phosphorus: 3 mg/dL (ref 2.3–4.6)

## 2013-09-08 LAB — T-HELPER CELL (CD4) - (RCID CLINIC ONLY)
CD4 T CELL HELPER: 37 % (ref 33–55)
CD4 T Cell Abs: 520 /uL (ref 400–2700)

## 2013-09-08 LAB — HIV-1 RNA QUANT-NO REFLEX-BLD
HIV 1 RNA Quant: <20 copies/mL (ref <20)
HIV-1 RNA Quant, Log: <1.3 {Log} (ref <1.30)

## 2013-09-08 LAB — MICROALBUMIN / CREATININE URINE RATIO
CREATININE, URINE: 163.5 mg/dL
Microalb Creat Ratio: 7.2 mg/g (ref 0.0–30.0)
Microalb, Ur: 1.17 mg/dL (ref 0.00–1.89)

## 2013-09-08 LAB — RPR

## 2013-09-11 ENCOUNTER — Other Ambulatory Visit: Payer: Self-pay | Admitting: *Deleted

## 2013-09-11 DIAGNOSIS — Z23 Encounter for immunization: Secondary | ICD-10-CM

## 2013-09-11 DIAGNOSIS — I10 Essential (primary) hypertension: Secondary | ICD-10-CM

## 2013-09-11 DIAGNOSIS — B20 Human immunodeficiency virus [HIV] disease: Secondary | ICD-10-CM

## 2013-09-11 MED ORDER — EFAVIRENZ-EMTRICITAB-TENOFOVIR 600-200-300 MG PO TABS: 1.0000 | ORAL_TABLET | Freq: Every day | ORAL | Status: DC

## 2013-09-11 NOTE — Telephone Encounter (Signed)
ADAP Application 

## 2013-10-02 ENCOUNTER — Ambulatory Visit: Payer: Self-pay | Admitting: Infectious Diseases

## 2013-12-26 ENCOUNTER — Encounter: Payer: Self-pay | Admitting: Internal Medicine

## 2013-12-26 ENCOUNTER — Ambulatory Visit (INDEPENDENT_AMBULATORY_CARE_PROVIDER_SITE_OTHER): Payer: Self-pay | Admitting: Internal Medicine

## 2013-12-26 VITALS — BP 128/81 | HR 69 | Temp 98.4°F | Wt 144.0 lb

## 2013-12-26 DIAGNOSIS — F329 Major depressive disorder, single episode, unspecified: Secondary | ICD-10-CM

## 2013-12-26 DIAGNOSIS — Z23 Encounter for immunization: Secondary | ICD-10-CM

## 2013-12-26 DIAGNOSIS — B20 Human immunodeficiency virus [HIV] disease: Secondary | ICD-10-CM

## 2013-12-26 DIAGNOSIS — F32A Depression, unspecified: Secondary | ICD-10-CM

## 2013-12-26 DIAGNOSIS — I10 Essential (primary) hypertension: Secondary | ICD-10-CM

## 2013-12-26 MED ORDER — EFAVIRENZ-EMTRICITAB-TENOFOVIR 600-200-300 MG PO TABS: 1.0000 | ORAL_TABLET | Freq: Every day | ORAL | Status: DC

## 2013-12-26 NOTE — Progress Notes (Signed)
   Subjective:    Patient ID: Ryan Cruz, male    DOB: 05/10/1965, 48 y.o.   MRN: [id number]  HPI He comes in for follow-up of HIV. He has missed some previous appointments and comes in on a walk in basis. He has been on a Atripla and had good compliance with his last CD4 of 520 and undetectable viral load however he unfortunately has been out of his medications for the last 6 weeks. He tells me she had "stuff" going on. He feels things are now stable. He previously had been very compliant with no missed doses. No new issues, no hospitalizations or other symptoms.   Review of Systems  Constitutional: Negative for fatigue.  Gastrointestinal: Negative for nausea and diarrhea.  Skin: Negative for rash.  Neurological: Negative for dizziness and light-headedness.  Psychiatric/Behavioral: Negative for sleep disturbance and dysphoric mood. The patient is not nervous/anxious.        Objective:   Physical Exam  Constitutional: He appears well-developed and well-nourished. No distress.  HENT:  Mouth/Throat: No oropharyngeal exudate.  Eyes: No scleral icterus.  Cardiovascular: Normal rate, regular rhythm and normal heart sounds.   No murmur heard. Pulmonary/Chest: Effort normal and breath sounds normal. No respiratory distress. He has no wheezes.  Lymphadenopathy:    He has no cervical adenopathy.  Skin: No rash noted.          Assessment & Plan:

## 2013-12-26 NOTE — Assessment & Plan Note (Signed)
I discussed with him resistance. I will check a genotype today though likely will be wild-type and he will restart a Atripla. I will then recheck a viral load in 3-4 weeks and he will follow up with his primary provider after that. He understands he may be resistant and need to change his medications.

## 2013-12-26 NOTE — Assessment & Plan Note (Signed)
He feels things are stable and no need to return to a counselor.

## 2013-12-27 LAB — T-HELPER CELL (CD4) - (RCID CLINIC ONLY)
CD4 % Helper T Cell: 36 % (ref 33–55)
CD4 T Cell Abs: 590 /uL (ref 400–2700)

## 2013-12-27 LAB — HIV-1 RNA ULTRAQUANT REFLEX TO GENTYP+: HIV-1 RNA Quant, Log: <1.3 {Log} (ref <1.30)

## 2014-01-18 ENCOUNTER — Other Ambulatory Visit (INDEPENDENT_AMBULATORY_CARE_PROVIDER_SITE_OTHER): Payer: Self-pay

## 2014-01-18 DIAGNOSIS — B20 Human immunodeficiency virus [HIV] disease: Secondary | ICD-10-CM

## 2014-01-21 LAB — HIV-1 RNA ULTRAQUANT REFLEX TO GENTYP+: HIV-1 RNA Quant, Log: <1.3 {Log} (ref <1.30)

## 2014-01-29 ENCOUNTER — Ambulatory Visit (INDEPENDENT_AMBULATORY_CARE_PROVIDER_SITE_OTHER): Payer: Self-pay | Admitting: Infectious Diseases

## 2014-01-29 ENCOUNTER — Encounter: Payer: Self-pay | Admitting: Infectious Diseases

## 2014-01-29 VITALS — BP 163/91 | HR 74 | Temp 98.4°F | Wt 141.0 lb

## 2014-01-29 DIAGNOSIS — Z79899 Other long term (current) drug therapy: Secondary | ICD-10-CM

## 2014-01-29 DIAGNOSIS — H6123 Impacted cerumen, bilateral: Secondary | ICD-10-CM

## 2014-01-29 DIAGNOSIS — A630 Anogenital (venereal) warts: Secondary | ICD-10-CM

## 2014-01-29 DIAGNOSIS — Z113 Encounter for screening for infections with a predominantly sexual mode of transmission: Secondary | ICD-10-CM

## 2014-01-29 DIAGNOSIS — B20 Human immunodeficiency virus [HIV] disease: Secondary | ICD-10-CM

## 2014-01-29 DIAGNOSIS — E162 Hypoglycemia, unspecified: Secondary | ICD-10-CM

## 2014-01-29 DIAGNOSIS — H6120 Impacted cerumen, unspecified ear: Secondary | ICD-10-CM | POA: Insufficient documentation

## 2014-01-29 LAB — HEMOGLOBIN A1C
Hgb A1c MFr Bld: 5.2 % (ref <5.7)
Mean Plasma Glucose: 103 mg/dL (ref <117)

## 2014-01-29 NOTE — Assessment & Plan Note (Signed)
His ears are irrigated today. His hearing improved.

## 2014-01-29 NOTE — Assessment & Plan Note (Signed)
Will set him for anoscopy

## 2014-01-29 NOTE — Assessment & Plan Note (Signed)
Will get him PCP.
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
## 2014-01-29 NOTE — Assessment & Plan Note (Signed)
Doing well. Will continue his current ART. He is offered/refuses condoms. His vax are up to date.  rtc 6 months.

## 2014-02-28 ENCOUNTER — Ambulatory Visit: Payer: Self-pay | Admitting: Family Medicine

## 2014-07-17 ENCOUNTER — Other Ambulatory Visit: Payer: Self-pay

## 2014-08-01 ENCOUNTER — Ambulatory Visit: Payer: Self-pay | Admitting: Infectious Diseases

## 2014-08-28 ENCOUNTER — Other Ambulatory Visit (INDEPENDENT_AMBULATORY_CARE_PROVIDER_SITE_OTHER): Payer: Self-pay

## 2014-08-28 DIAGNOSIS — Z113 Encounter for screening for infections with a predominantly sexual mode of transmission: Secondary | ICD-10-CM

## 2014-08-28 DIAGNOSIS — Z79899 Other long term (current) drug therapy: Secondary | ICD-10-CM

## 2014-08-28 DIAGNOSIS — B20 Human immunodeficiency virus [HIV] disease: Secondary | ICD-10-CM

## 2014-08-28 LAB — CBC
HEMATOCRIT: 38.6 % — AB (ref 39.0–52.0)
Hemoglobin: 13.6 g/dL (ref 13.0–17.0)
MCH: 32.1 pg (ref 26.0–34.0)
MCHC: 35.2 g/dL (ref 30.0–36.0)
MCV: 91 fL (ref 78.0–100.0)
MPV: 9.9 fL (ref 8.6–12.4)
Platelets: 251 10*3/uL (ref 150–400)
RBC: 4.24 MIL/uL (ref 4.22–5.81)
RDW: 13.7 % (ref 11.5–15.5)
WBC: 5.1 10*3/uL (ref 4.0–10.5)

## 2014-08-28 LAB — COMPREHENSIVE METABOLIC PANEL
ALK PHOS: 62 U/L (ref 39–117)
ALT: 16 U/L (ref 0–53)
AST: 17 U/L (ref 0–37)
Albumin: 4.2 g/dL (ref 3.5–5.2)
BUN: 10 mg/dL (ref 6–23)
CO2: 23 mEq/L (ref 19–32)
CREATININE: 1.02 mg/dL (ref 0.50–1.35)
Calcium: 9.3 mg/dL (ref 8.4–10.5)
Chloride: 108 mEq/L (ref 96–112)
GLUCOSE: 103 mg/dL — AB (ref 70–99)
POTASSIUM: 3.9 meq/L (ref 3.5–5.3)
SODIUM: 140 meq/L (ref 135–145)
Total Bilirubin: 0.4 mg/dL (ref 0.2–1.2)
Total Protein: 7.4 g/dL (ref 6.0–8.3)

## 2014-08-28 LAB — LIPID PANEL
CHOL/HDL RATIO: 4.4 ratio
Cholesterol: 195 mg/dL (ref 0–200)
HDL: 44 mg/dL (ref >=40)
LDL Cholesterol: 114 mg/dL — ABNORMAL HIGH (ref 0–99)
TRIGLYCERIDES: 183 mg/dL — AB (ref <150)
VLDL: 37 mg/dL (ref 0–40)

## 2014-08-29 LAB — URINE CYTOLOGY ANCILLARY ONLY
Chlamydia: NEGATIVE
NEISSERIA GONORRHEA: NEGATIVE

## 2014-08-29 LAB — T-HELPER CELL (CD4) - (RCID CLINIC ONLY)
CD4 T CELL HELPER: 36 % (ref 33–55)
CD4 T Cell Abs: 790 /uL (ref 400–2700)

## 2014-08-29 LAB — RPR

## 2014-08-30 LAB — HIV-1 RNA QUANT-NO REFLEX-BLD: HIV-1 RNA Quant, Log: <1.3 {Log} (ref <1.30)

## 2014-09-20 ENCOUNTER — Other Ambulatory Visit: Payer: Self-pay | Admitting: Infectious Disease

## 2014-09-20 ENCOUNTER — Other Ambulatory Visit: Payer: Self-pay | Admitting: Infectious Diseases

## 2014-10-01 ENCOUNTER — Telehealth: Payer: Self-pay | Admitting: *Deleted
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
## 2014-10-02 ENCOUNTER — Ambulatory Visit: Payer: Self-pay | Admitting: *Deleted

## 2014-10-02 DIAGNOSIS — Z8659 Personal history of other mental and behavioral disorders: Secondary | ICD-10-CM

## 2014-10-02 DIAGNOSIS — F411 Generalized anxiety disorder: Secondary | ICD-10-CM
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
## 2014-10-09 ENCOUNTER — Ambulatory Visit: Payer: Self-pay | Admitting: *Deleted

## 2014-10-09 DIAGNOSIS — R454 Irritability and anger: Secondary | ICD-10-CM
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
## 2014-10-17 ENCOUNTER — Encounter: Payer: Self-pay | Admitting: Infectious Diseases

## 2014-10-17 ENCOUNTER — Telehealth: Payer: Self-pay | Admitting: *Deleted

## 2014-10-17 ENCOUNTER — Ambulatory Visit (INDEPENDENT_AMBULATORY_CARE_PROVIDER_SITE_OTHER): Payer: Self-pay | Admitting: Infectious Diseases

## 2014-10-17 DIAGNOSIS — I10 Essential (primary) hypertension: Secondary | ICD-10-CM

## 2014-10-17 DIAGNOSIS — F32A Depression, unspecified: Secondary | ICD-10-CM

## 2014-10-17 DIAGNOSIS — Z23 Encounter for immunization: Secondary | ICD-10-CM

## 2014-10-17 DIAGNOSIS — F329 Major depressive disorder, single episode, unspecified: Secondary | ICD-10-CM

## 2014-10-17 DIAGNOSIS — B20 Human immunodeficiency virus [HIV] disease: Secondary | ICD-10-CM

## 2014-10-17 MED ORDER — EMTRICITAB-RILPIVIR-TENOFOV AF 200-25-25 MG PO TABS: 1.0000 | ORAL_TABLET | Freq: Every day | ORAL | Status: DC

## 2014-10-17 MED ORDER — SERTRALINE HCL 50 MG PO TABS: 50.0000 mg | ORAL_TABLET | Freq: Every day | ORAL | Status: DC

## 2014-10-17 MED ORDER — LISINOPRIL 30 MG PO TABS: 30.0000 mg | ORAL_TABLET | Freq: Every day | ORAL | Status: DC

## 2014-10-17 NOTE — Assessment & Plan Note (Signed)
Will refer to PCP.  

## 2014-10-17 NOTE — Telephone Encounter (Signed)
Called and left patient a voice mail to let him know of his appt at [facility] for primary care on 10/29/14. Address provided and asked that he call this clinic back to confirm. [facility] is going to speak to Dr. Marcello Moores regarding that referral and will call me back with the appt information. Myrtis Hopping

## 2014-10-17 NOTE — Assessment & Plan Note (Signed)
Appreciate f/u with jodi.  Will change prozac to zoloft.  rtc in 6 wereks.

## 2014-10-17 NOTE — Assessment & Plan Note (Signed)
Will change his atripla to odefsy.  Offered/refused condoms.  Not sexually active.  Got flu shot at work rtc in 6 weeks for med issues.

## 2014-10-17 NOTE — Progress Notes (Signed)
   Subjective:    Patient ID: Ryan Cruz, male    DOB: September 26, 1965, 49 y.o.   MRN: [id number]  HPI 49 yo M with hx of HIV+ and HTN. Also has hx of hemmerroids, anal warts.  At prev visit was complaining of episodes of hypoglycemia- was to be set with PCP- did not happen. Still having low Glc episodes. Eats irregularly.  Has been taking his anti-htn rx. Has been having headaches.   Quit taking prozac, was making him crazy. Having fits of anger. Has been seeing jodi.  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 Loves it.   HIV 1 RNA QUANT (copies/mL)  Date Value  08/28/2014 <20  01/18/2014 <20  12/26/2013 <20   CD4 T CELL ABS (/uL)  Date Value  08/28/2014 790  12/26/2013 590  09/07/2013 520   FHx- mother , CVA, HTN. Pheo? Has adrenal tumor. DM.   Review of Systems  Constitutional: Negative for appetite change and unexpected weight change.  Respiratory: Negative for shortness of breath.   Cardiovascular: Negative for chest pain.  Gastrointestinal: Negative for diarrhea and constipation.  Genitourinary: Negative for difficulty urinating.  Neurological: Positive for headaches.  Psychiatric/Behavioral: Positive for behavioral problems.       Objective:   Physical Exam  Constitutional: He appears well-developed and well-nourished.  HENT:  Mouth/Throat: No oropharyngeal exudate.  Eyes: EOM are normal. Pupils are equal, round, and reactive to light.  Neck: Neck supple.  Cardiovascular: Normal rate, regular rhythm and normal heart sounds.   Pulmonary/Chest: Effort normal and breath sounds normal.  Abdominal: Soft. Bowel sounds are normal. There is no tenderness. There is no rebound.  Musculoskeletal: He exhibits no edema.  Lymphadenopathy:    He has no cervical adenopathy.      Assessment & Plan:

## 2014-10-17 NOTE — Assessment & Plan Note (Signed)
Will get him back in with Dr Marcello Moores.

## 2014-10-17 NOTE — Assessment & Plan Note (Signed)
Will increase ACE, refer to PCP, rtc in 6 weeks for bp check and BMP

## 2014-10-23 ENCOUNTER — Ambulatory Visit: Payer: Self-pay | Admitting: *Deleted

## 2014-10-29 ENCOUNTER — Ambulatory Visit: Payer: Self-pay | Admitting: Family Medicine

## 2014-11-28 ENCOUNTER — Ambulatory Visit (INDEPENDENT_AMBULATORY_CARE_PROVIDER_SITE_OTHER): Payer: Self-pay | Admitting: Infectious Diseases

## 2014-11-28 ENCOUNTER — Encounter: Payer: Self-pay | Admitting: Infectious Diseases

## 2014-11-28 VITALS — BP 163/104 | HR 65 | Temp 98.4°F | Ht 67.0 in | Wt 144.0 lb

## 2014-11-28 DIAGNOSIS — A630 Anogenital (venereal) warts: Secondary | ICD-10-CM

## 2014-11-28 DIAGNOSIS — F17200 Nicotine dependence, unspecified, uncomplicated: Secondary | ICD-10-CM

## 2014-11-28 DIAGNOSIS — E162 Hypoglycemia, unspecified: Secondary | ICD-10-CM

## 2014-11-28 DIAGNOSIS — I10 Essential (primary) hypertension: Secondary | ICD-10-CM

## 2014-11-28 DIAGNOSIS — B20 Human immunodeficiency virus [HIV] disease: Secondary | ICD-10-CM

## 2014-11-28 NOTE — Assessment & Plan Note (Signed)
Will refer him to IM

## 2014-11-28 NOTE — Progress Notes (Signed)
   Subjective:    Patient ID: Ryan Cruz, male    DOB: 31-Mar-1965, 49 y.o.   MRN: [id number]  HPI 49 yo M with hx of HIV+ and HTN. Also has hx of hemmerroids, anal warts.  At prev visit was complaining of episodes of hypoglycemia- was to be set with PCP- did not happen. Still having low Glc episodes. Eats irregularly.  Has been taking his anti-htn rx. Has been having headaches. At his previous visit his ACE was increased, his BP remains high today. Was 225/116 on 10-7. He relates this to job stress. By 10-8 was down to 150s/90s. His headache improved.  Quit taking prozac. Was seeing jodi, has gotten off schedule with her.  Needs to reschedule with Dr Marcello Moores.   HIV 1 RNA QUANT (copies/mL)  Date Value  08/28/2014 <20  01/18/2014 <20  12/26/2013 <20   CD4 T CELL ABS (/uL)  Date Value  08/28/2014 790  12/26/2013 590  09/07/2013 520   Review of Systems  Constitutional: Negative for appetite change and unexpected weight change.  Respiratory: Negative for shortness of breath.   Cardiovascular: Negative for chest pain.  Gastrointestinal: Negative for diarrhea and constipation.  Genitourinary: Negative for difficulty urinating.  Neurological: Positive for headaches.       Objective:   Physical Exam  Constitutional: He appears well-developed and well-nourished.  HENT:  Ears:  Mouth/Throat: No oropharyngeal exudate.  Eyes: Pupils are equal, round, and reactive to light.  Neck: Neck supple.  Cardiovascular: Normal rate, regular rhythm and normal heart sounds.   Pulmonary/Chest: Effort normal and breath sounds normal.  Abdominal: Soft. Bowel sounds are normal. There is no tenderness. There is no rebound.  Musculoskeletal: He exhibits no edema.  Lymphadenopathy:    He has no cervical adenopathy.       Assessment & Plan:

## 2014-11-28 NOTE — Assessment & Plan Note (Signed)
Will refer him to surgery

## 2014-11-28 NOTE — Assessment & Plan Note (Signed)
Encouraged to quit. Has cut down to 3-5

## 2014-11-28 NOTE — Assessment & Plan Note (Signed)
Will see him back in 3 months with labs

## 2014-11-29 ENCOUNTER — Telehealth: Payer: Self-pay | Admitting: *Deleted

## 2014-11-29 NOTE — Telephone Encounter (Signed)
Patient is scheduled for follow up appointment at [facility] with Dr. Marcello Moores on 11/14 at 3:30 (3:30 arrival).  Patient is uninsured, will need to bring $98.70 for the office visit.  Patient no-showed an appointment in September, [facility] if he is unable to keep this appointment for any reason.  RN unable to contact patient at phone number listed in chart, will try again.  Will fax referral information to [phone]. Landis Gandy, RN

## 2014-12-17 ENCOUNTER — Telehealth: Payer: Self-pay | Admitting: *Deleted

## 2014-12-17 NOTE — Telephone Encounter (Signed)
Left message reminding patient of his upcoming appointment at [facility], 11/14 3:30.   Landis Gandy, RN

## 2015-02-14 ENCOUNTER — Other Ambulatory Visit: Payer: Self-pay

## 2015-02-28 ENCOUNTER — Telehealth: Payer: Self-pay | Admitting: *Deleted

## 2015-02-28 ENCOUNTER — Encounter: Payer: Self-pay | Admitting: Infectious Diseases

## 2015-02-28 ENCOUNTER — Ambulatory Visit (INDEPENDENT_AMBULATORY_CARE_PROVIDER_SITE_OTHER): Payer: Self-pay | Admitting: Infectious Diseases

## 2015-02-28 VITALS — BP 163/97 | HR 65 | Temp 98.1°F | Wt 142.8 lb

## 2015-02-28 DIAGNOSIS — B20 Human immunodeficiency virus [HIV] disease: Secondary | ICD-10-CM

## 2015-02-28 DIAGNOSIS — I10 Essential (primary) hypertension: Secondary | ICD-10-CM

## 2015-02-28 DIAGNOSIS — A630 Anogenital (venereal) warts: Secondary | ICD-10-CM

## 2015-02-28 DIAGNOSIS — Z113 Encounter for screening for infections with a predominantly sexual mode of transmission: Secondary | ICD-10-CM

## 2015-02-28 DIAGNOSIS — Z79899 Other long term (current) drug therapy: Secondary | ICD-10-CM

## 2015-02-28 DIAGNOSIS — F17200 Nicotine dependence, unspecified, uncomplicated: Secondary | ICD-10-CM

## 2015-02-28 LAB — COMPREHENSIVE METABOLIC PANEL
ALBUMIN: 4.5 g/dL (ref 3.6–5.1)
ALT: 17 U/L (ref 9–46)
AST: 17 U/L (ref 10–40)
Alkaline Phosphatase: 49 U/L (ref 40–115)
BILIRUBIN TOTAL: 0.5 mg/dL (ref 0.2–1.2)
BUN: 14 mg/dL (ref 7–25)
CO2: 27 mmol/L (ref 20–31)
CREATININE: 0.97 mg/dL (ref 0.60–1.35)
Calcium: 9.7 mg/dL (ref 8.6–10.3)
Chloride: 105 mmol/L (ref 98–110)
GLUCOSE: 82 mg/dL (ref 65–99)
Potassium: 4 mmol/L (ref 3.5–5.3)
SODIUM: 140 mmol/L (ref 135–146)
Total Protein: 7.1 g/dL (ref 6.1–8.1)

## 2015-02-28 LAB — CBC
HEMATOCRIT: 37.7 % — AB (ref 39.0–52.0)
HEMOGLOBIN: 12.6 g/dL — AB (ref 13.0–17.0)
MCH: 31.9 pg (ref 26.0–34.0)
MCHC: 33.4 g/dL (ref 30.0–36.0)
MCV: 95.4 fL (ref 78.0–100.0)
MPV: 9.8 fL (ref 8.6–12.4)
PLATELETS: 257 10*3/uL (ref 150–400)
RBC: 3.95 MIL/uL — ABNORMAL LOW (ref 4.22–5.81)
RDW: 14.2 % (ref 11.5–15.5)
WBC: 4.4 10*3/uL (ref 4.0–10.5)

## 2015-02-28 LAB — LIPID PANEL
Cholesterol: 179 mg/dL (ref 125–200)
HDL: 41 mg/dL (ref >=40)
LDL CALC: 101 mg/dL (ref <130)
Total CHOL/HDL Ratio: 4.4 Ratio (ref <=5.0)
Triglycerides: 186 mg/dL — ABNORMAL HIGH (ref <150)
VLDL: 37 mg/dL — ABNORMAL HIGH (ref <30)

## 2015-02-28 NOTE — Assessment & Plan Note (Signed)
Encouraged to quit. 

## 2015-02-28 NOTE — Assessment & Plan Note (Signed)
Will get him back in with Dr Thomas.  

## 2015-02-28 NOTE — Telephone Encounter (Signed)
Needs to apply for "[employer]", no insurance currently.  Pt given [id number] application information.  He needs to do this ASAP because the "slots' at [facility] run out very quickly.

## 2015-02-28 NOTE — Assessment & Plan Note (Signed)
He is doing well.  Not sexually active, Offered/refused condoms.  His vax are up to date. He is Hep B immune.  Will check his labs today See him back in 6 months

## 2015-02-28 NOTE — Assessment & Plan Note (Signed)
He is taking lisinopril Will get him in with PCP

## 2015-02-28 NOTE — Progress Notes (Addendum)
Pt does not have insurance.  Given information to apply for "Orange Card" through [facility] to refer for Condyloma acuminatum.

## 2015-02-28 NOTE — Progress Notes (Signed)
   Subjective:    Patient ID: Ryan Cruz, male    DOB: Aug 02, 1965, 50 y.o.   MRN: [id number]  HPI 50 yo M with hx of HIV+, anal warts, HTN, hypoglycemic episodes.  On odefsy.  Denies sx from HTN.  His Glc have been low again. Gets fatigued or mean. Eats more frequently. Checks FSG at his work.   Missed his pre-visit labs.  HIV 1 RNA QUANT (copies/mL)  Date Value  08/28/2014 <20  01/18/2014 <20  12/26/2013 <20   CD4 T CELL ABS (/uL)  Date Value  08/28/2014 790  12/26/2013 590  09/07/2013 520    Review of Systems  Constitutional: Negative for fever, chills, appetite change and unexpected weight change.  Respiratory: Negative for shortness of breath.   Cardiovascular: Negative for chest pain.  Gastrointestinal: Negative for diarrhea, constipation and blood in stool.  Genitourinary: Negative for difficulty urinating.  Neurological: Positive for dizziness. Negative for headaches.  had 1 episode of blood in his stool, he knows why (but won't explain).     Objective:   Physical Exam  Constitutional: He appears well-developed and well-nourished.  HENT:  Mouth/Throat: No oropharyngeal exudate.  Eyes: EOM are normal. Pupils are equal, round, and reactive to light.  Neck: Neck supple.  Cardiovascular: Normal rate, regular rhythm and normal heart sounds.   Pulmonary/Chest: Effort normal and breath sounds normal.  Abdominal: Soft. Bowel sounds are normal. There is no tenderness. There is no rebound.  Musculoskeletal: He exhibits no edema.  Lymphadenopathy:    He has no cervical adenopathy.       Assessment & Plan:

## 2015-02-28 NOTE — Addendum Note (Signed)
Addended byMeriel Pica F on: 02/28/2015 04:56 PM   Modules accepted: Orders

## 2015-03-01 LAB — T-HELPER CELL (CD4) - (RCID CLINIC ONLY)
CD4 T CELL HELPER: 37 % (ref 33–55)
CD4 T Cell Abs: 820 /uL (ref 400–2700)

## 2015-03-01 LAB — RPR

## 2015-03-04 LAB — URINE CYTOLOGY ANCILLARY ONLY
CHLAMYDIA, DNA PROBE: NEGATIVE
Neisseria Gonorrhea: NEGATIVE

## 2015-03-04 LAB — HIV-1 RNA QUANT-NO REFLEX-BLD

## 2015-06-25 ENCOUNTER — Other Ambulatory Visit: Payer: Self-pay | Admitting: *Deleted

## 2015-06-25 ENCOUNTER — Ambulatory Visit: Payer: Self-pay

## 2015-06-25 DIAGNOSIS — B20 Human immunodeficiency virus [HIV] disease: Secondary | ICD-10-CM

## 2015-06-25 MED ORDER — EMTRICITAB-RILPIVIR-TENOFOV AF 200-25-25 MG PO TABS: 1.0000 | ORAL_TABLET | Freq: Every day | ORAL | Status: DC

## 2015-07-01 ENCOUNTER — Other Ambulatory Visit: Payer: Self-pay

## 2015-07-01 DIAGNOSIS — B20 Human immunodeficiency virus [HIV] disease: Secondary | ICD-10-CM

## 2015-07-02 LAB — COMPREHENSIVE METABOLIC PANEL
ALK PHOS: 59 U/L (ref 40–115)
ALT: 15 U/L (ref 9–46)
AST: 16 U/L (ref 10–40)
Albumin: 4.1 g/dL (ref 3.6–5.1)
BUN: 10 mg/dL (ref 7–25)
CALCIUM: 8.3 mg/dL — AB (ref 8.6–10.3)
CHLORIDE: 106 mmol/L (ref 98–110)
CO2: 24 mmol/L (ref 20–31)
Creat: 1.02 mg/dL (ref 0.60–1.35)
GLUCOSE: 75 mg/dL (ref 65–99)
POTASSIUM: 3.4 mmol/L — AB (ref 3.5–5.3)
Sodium: 140 mmol/L (ref 135–146)
Total Bilirubin: 0.4 mg/dL (ref 0.2–1.2)
Total Protein: 6.9 g/dL (ref 6.1–8.1)

## 2015-07-02 LAB — CBC WITH DIFFERENTIAL/PLATELET
BASOS PCT: 1 %
Basophils Absolute: 43 cells/uL (ref 0–200)
Eosinophils Absolute: 86 cells/uL (ref 15–500)
Eosinophils Relative: 2 %
HEMATOCRIT: 34.7 % — AB (ref 38.5–50.0)
Hemoglobin: 11.7 g/dL — ABNORMAL LOW (ref 13.2–17.1)
LYMPHS ABS: 1677 {cells}/uL (ref 850–3900)
LYMPHS PCT: 39 %
MCH: 31.4 pg (ref 27.0–33.0)
MCHC: 33.7 g/dL (ref 32.0–36.0)
MCV: 93 fL (ref 80.0–100.0)
MONO ABS: 473 {cells}/uL (ref 200–950)
MPV: 10.1 fL (ref 7.5–12.5)
Monocytes Relative: 11 %
NEUTROS ABS: 2021 {cells}/uL (ref 1500–7800)
Neutrophils Relative %: 47 %
Platelets: 206 10*3/uL (ref 140–400)
RBC: 3.73 MIL/uL — AB (ref 4.20–5.80)
RDW: 14.5 % (ref 11.0–15.0)
WBC: 4.3 10*3/uL (ref 3.8–10.8)

## 2015-07-02 LAB — HIV-1 RNA QUANT-NO REFLEX-BLD
HIV 1 RNA Quant: 136877 copies/mL — ABNORMAL HIGH (ref <20)
HIV-1 RNA QUANT, LOG: 5.14 {Log_copies}/mL — AB (ref <1.30)

## 2015-07-02 LAB — T-HELPER CELL (CD4) - (RCID CLINIC ONLY)
CD4 % Helper T Cell: 29 % — ABNORMAL LOW (ref 33–55)
CD4 T Cell Abs: 550 /uL (ref 400–2700)

## 2015-07-15 ENCOUNTER — Ambulatory Visit (INDEPENDENT_AMBULATORY_CARE_PROVIDER_SITE_OTHER): Payer: Self-pay | Admitting: Infectious Diseases

## 2015-07-15 ENCOUNTER — Encounter: Payer: Self-pay | Admitting: Infectious Diseases

## 2015-07-15 ENCOUNTER — Telehealth: Payer: Self-pay

## 2015-07-15 VITALS — BP 161/97 | HR 66 | Temp 98.1°F | Ht 68.0 in | Wt 150.0 lb

## 2015-07-15 DIAGNOSIS — A630 Anogenital (venereal) warts: Secondary | ICD-10-CM

## 2015-07-15 DIAGNOSIS — I10 Essential (primary) hypertension: Secondary | ICD-10-CM

## 2015-07-15 DIAGNOSIS — B20 Human immunodeficiency virus [HIV] disease: Secondary | ICD-10-CM

## 2015-07-15 DIAGNOSIS — F17200 Nicotine dependence, unspecified, uncomplicated: Secondary | ICD-10-CM

## 2015-07-15 MED ORDER — LISINOPRIL 40 MG PO TABS: 40.0000 mg | ORAL_TABLET | Freq: Every day | ORAL | Status: DC

## 2015-07-15 NOTE — Assessment & Plan Note (Signed)
Encouraged to quit. 

## 2015-07-15 NOTE — Assessment & Plan Note (Signed)
Will increase his lisinopril to 40mg  qday.  rtc in 3 months

## 2015-07-15 NOTE — Assessment & Plan Note (Signed)
Will help him get "orange card" for surgical f/u.

## 2015-07-15 NOTE — Telephone Encounter (Signed)
Patient needs referral to Dr. Leighton Ruff, colorectal surgeon, per Dr. Johnnye Sima. Patient's orange card expired, verified by Stormy Fabian at [facility] for [facility]. Given information to renew orange card and was told to once card is obtained to bring by clinic to have a copy put in the system. Explained that once this was done the referral will be able to be done. Patient stated understanding. Rodman Key, LPN

## 2015-07-15 NOTE — Assessment & Plan Note (Addendum)
Will have him back in 3 months to repeat labs, f/u on BP Offered/refused condoms.

## 2015-07-15 NOTE — Progress Notes (Signed)
   Subjective:    Patient ID: Ryan Cruz, male    DOB: 26-Nov-1965, 50 y.o.   MRN: [id number]  HPI 50 yo M with hx of HIV+, anal warts, HTN, hypoglycemic episodes.  On odefsy.  Today having throbbing feeling in head. Not pain. Has been up all weekend making flower arrangements and cooking for his church anniversary this weekend.  No problems with medications. Was off his ART for 1-2 weeks, now back on for 1 week.   BP at work has been elevated.   HIV 1 RNA QUANT (copies/mL)  Date Value  07/01/2015 [id number]  02/28/2015 <20  08/28/2014 <20   CD4 T CELL ABS (/uL)  Date Value  07/01/2015 550  02/28/2015 820  08/28/2014 790    Review of Systems  Constitutional: Negative for appetite change and unexpected weight change.  Respiratory: Negative for chest tightness and shortness of breath.   Cardiovascular: Negative for chest pain.  Gastrointestinal: Negative for diarrhea and constipation.  Genitourinary: Positive for urgency and frequency. Negative for difficulty urinating.  Neurological: Negative for headaches.   BP repeated 155/95     Objective:   Physical Exam  Constitutional: He appears well-developed and well-nourished.  HENT:  Mouth/Throat: No oropharyngeal exudate.  Eyes: EOM are normal. Pupils are equal, round, and reactive to light.  Neck: Neck supple.  Cardiovascular: Normal rate, regular rhythm and normal heart sounds.   Pulmonary/Chest: Effort normal and breath sounds normal.  Abdominal: Soft. Bowel sounds are normal. There is no tenderness. There is no rebound.  Musculoskeletal: He exhibits no edema.  Lymphadenopathy:    He has no cervical adenopathy.       Assessment & Plan:

## 2015-10-09 ENCOUNTER — Other Ambulatory Visit: Payer: Self-pay

## 2015-10-23 ENCOUNTER — Ambulatory Visit: Payer: Self-pay | Admitting: Infectious Diseases

## 2015-11-20 ENCOUNTER — Ambulatory Visit (HOSPITAL_COMMUNITY)
Admission: RE | Admit: 2015-11-20 | Discharge: 2015-11-20 | Disposition: A | Discharge status: Home / Self Care | Payer: Self-pay | Source: Ambulatory Visit | Attending: Infectious Diseases | Admitting: Infectious Diseases

## 2015-11-20 ENCOUNTER — Ambulatory Visit (INDEPENDENT_AMBULATORY_CARE_PROVIDER_SITE_OTHER): Payer: Self-pay | Admitting: Infectious Diseases

## 2015-11-20 ENCOUNTER — Other Ambulatory Visit: Payer: Self-pay

## 2015-11-20 ENCOUNTER — Encounter: Payer: Self-pay | Admitting: Infectious Diseases

## 2015-11-20 ENCOUNTER — Encounter: Payer: Self-pay | Admitting: *Deleted

## 2015-11-20 VITALS — BP 160/91 | HR 68 | Temp 98.2°F | Ht 68.0 in | Wt 148.0 lb

## 2015-11-20 DIAGNOSIS — I10 Essential (primary) hypertension: Secondary | ICD-10-CM

## 2015-11-20 DIAGNOSIS — B20 Human immunodeficiency virus [HIV] disease: Secondary | ICD-10-CM

## 2015-11-20 DIAGNOSIS — E162 Hypoglycemia, unspecified: Secondary | ICD-10-CM

## 2015-11-20 DIAGNOSIS — A630 Anogenital (venereal) warts: Secondary | ICD-10-CM

## 2015-11-20 DIAGNOSIS — K439 Ventral hernia without obstruction or gangrene: Secondary | ICD-10-CM

## 2015-11-20 DIAGNOSIS — R079 Chest pain, unspecified: Secondary | ICD-10-CM | POA: Insufficient documentation

## 2015-11-20 DIAGNOSIS — R0789 Other chest pain: Secondary | ICD-10-CM | POA: Insufficient documentation

## 2015-11-20 DIAGNOSIS — Z79899 Other long term (current) drug therapy: Secondary | ICD-10-CM

## 2015-11-20 DIAGNOSIS — Z113 Encounter for screening for infections with a predominantly sexual mode of transmission: Secondary | ICD-10-CM

## 2015-11-20 MED ORDER — AMLODIPINE BESYLATE 5 MG PO TABS: 5.0000 mg | ORAL_TABLET | Freq: Every day | ORAL | 3 refills | Status: DC

## 2015-11-20 MED ORDER — LISINOPRIL 40 MG PO TABS: 40.0000 mg | ORAL_TABLET | Freq: Every day | ORAL | 3 refills | Status: DC

## 2015-11-20 NOTE — Assessment & Plan Note (Signed)
Will change ACE to ACE+ CCB rtc in 2 weeks.

## 2015-11-20 NOTE — Assessment & Plan Note (Signed)
Flu shot today.  Will see him back in 2 weeks  for BP recheck.  Check labs today

## 2015-11-20 NOTE — Assessment & Plan Note (Signed)
Given his BP and his description, very concerning. His EG shows NSR 64 bpm, no ST-T changes.  Will have him seen by [facility] asap

## 2015-11-20 NOTE — Assessment & Plan Note (Signed)
Will check u/s Non-tender .

## 2015-11-20 NOTE — Progress Notes (Signed)
   Subjective:    Patient ID: Ryan Cruz, male    DOB: 28-Sep-1965, 50 y.o.   MRN: [id number]  HPI 50 yo M with hx of HIV+, anal warts, HTN, hypoglycemic episodes.  On odefsy.   Checks FSG at his work. Have been 85-140.  F/u with Dr Marcello Moores still pending. Misses appts due to work.  Has had issues with htn, worse at night (up to 200s). Gets headaches and also pain in his L arm that radiates up his neck. Happens every 1-2 weeks. Gets tingling in his fingers when it is getting ready to happen.   HIV 1 RNA Quant (copies/mL)  Date Value  07/01/2015 136,877 (H)  02/28/2015 <20  08/28/2014 <20   CD4 T Cell Abs (/uL)  Date Value  07/01/2015 550  02/28/2015 820  08/28/2014 790   Review of Systems  Constitutional: Negative for appetite change and unexpected weight change.  Respiratory: Negative for cough and shortness of breath.   Cardiovascular: Positive for chest pain.  Gastrointestinal: Negative for constipation and diarrhea.  Endocrine: Positive for polyuria.  Genitourinary: Negative for difficulty urinating.  Neurological: Positive for headaches.      Objective:   Physical Exam  Constitutional: He appears well-developed and well-nourished.  HENT:  Mouth/Throat: No oropharyngeal exudate.  Eyes: EOM are normal. Pupils are equal, round, and reactive to light.  Neck: Neck supple.  Cardiovascular: Normal rate, regular rhythm and normal heart sounds.   Pulmonary/Chest: Effort normal and breath sounds normal.  Abdominal: Soft. Bowel sounds are normal. There is no tenderness. There is no rebound.  Musculoskeletal: He exhibits no edema.  Lymphadenopathy:    He has no cervical adenopathy.       Assessment & Plan:

## 2015-11-20 NOTE — Assessment & Plan Note (Signed)
Will refer him for PCP eval. His home glc of 140s needs f/u

## 2015-11-20 NOTE — Assessment & Plan Note (Signed)
He will f/u with Dr Marcello Moores

## 2015-11-21 ENCOUNTER — Telehealth: Payer: Self-pay | Admitting: Lab

## 2015-11-21 LAB — T-HELPER CELL (CD4) - (RCID CLINIC ONLY)
CD4 T CELL ABS: 510 /uL (ref 400–2700)
CD4 T CELL HELPER: 27 % — AB (ref 33–55)

## 2015-11-21 LAB — RPR

## 2015-11-21 NOTE — Telephone Encounter (Signed)
Being that the patient is uninsured, he was given the financial assistance form yesterday to return to the Billing Dept asap.  I spoke with him today to explain that he needed an ultrasound but I could not process it until the financial assistance form has been approved. He said that he plans to complete it today or tomorrow.  I asked him to give me a call as soon as he completes it so that I can process [place] referral.

## 2015-11-22 LAB — HIV-1 RNA QUANT-NO REFLEX-BLD

## 2015-11-22 LAB — URINE CYTOLOGY ANCILLARY ONLY
CHLAMYDIA, DNA PROBE: NEGATIVE
NEISSERIA GONORRHEA: NEGATIVE

## 2015-11-22 NOTE — Telephone Encounter (Signed)
thanks

## 2015-11-25 ENCOUNTER — Encounter: Payer: Self-pay | Admitting: Physician Assistant

## 2015-11-25 ENCOUNTER — Ambulatory Visit: Payer: Self-pay | Admitting: Physician Assistant

## 2015-11-25 ENCOUNTER — Encounter: Payer: Self-pay | Admitting: *Deleted

## 2015-11-25 NOTE — Progress Notes (Signed)
Patient is no show for cardiology visit  This encounter was created in error - please disregard.

## 2015-11-25 NOTE — Progress Notes (Deleted)
Cardiology Office Note    Date:  11/25/2015   ID:  ZARAK CATON, DOB 1965/04/18, MRN [id number]  PCP:  Bobby Rumpf, MD  Cardiologist:  New  Referred by Dr. Johnnye Sima Reason for referral: left arm pain  Chief Complaint  Patient presents with  . New Patient (Initial Visit)    seen for left arm pain referred by Dr. Evette Doffing with [facility] Dr. Debara Pickett    History of Present Illness:  Ryan Cruz is a 50 y.o. male with PMH of HTN, HIV and anxiety/depression. Patient has been followed by Dr. Johnnye Sima for his HIV. On recent visit on 11/22/2015, he mentioned some pain in his left arm that radiates to his neck, his symptom was concerning and it was referred to cardiology. He also have uncontrolled blood pressure with systolic blood pressure in the 160s, amlodipine 5 mg daily was added on top of lisinopril 40 mg daily.    Past Medical History:  Diagnosis Date  . Anxiety   . Asthma   . Depression   . HIV (human immunodeficiency virus infection) ([place])   . Hypertension     Past Surgical History:  Procedure Laterality Date  . COLONOSCOPY  02/12/2012   Procedure: COLONOSCOPY;  Surgeon: Leighton Ruff, MD;  Location: [facility];  Service: Endoscopy;  Laterality: N/A;  . HERNIA REPAIR     umbilibcal    Current Medications: Outpatient Medications Prior to Visit  Medication Sig Dispense Refill  . amLODipine (NORVASC) 5 MG tablet Take 1 tablet (5 mg total) by mouth daily. 30 tablet 3  . emtricitabine-rilpivir-tenofovir AF (ODEFSEY) 200-25-25 MG TABS tablet Take 1 tablet by mouth daily with breakfast. 30 tablet 3  . lisinopril (PRINIVIL,ZESTRIL) 40 MG tablet Take 1 tablet (40 mg total) by mouth daily. 90 tablet 3   No facility-administered medications prior to visit.      Allergies:   Review of patient's allergies indicates no known allergies.   Social History   Social History  . Marital status: Married    Spouse name: N/A  . Number of children: N/A  . Years of education: N/A    Social History Main Topics  . Smoking status: Current Every Day Smoker    Packs/day: 0.20    Years: 15.00    Types: Cigarettes  . Smokeless tobacco: Never Used     Comment: trying to cut back  . Alcohol use 1.2 oz/week    1 Glasses of wine, 1 Shots of liquor per week     Comment: occasional  . Drug use:     Frequency: 7.0 times per week    Types: Marijuana     Comment: marijuana  . Sexual activity: Yes    Partners: Male     Comment: pt. declined condoms   Other Topics Concern  . Not on file   Social History Narrative  . No narrative on file     Family History:  The patient's ***family history includes Congestive Heart Failure in his mother; Diabetes in his brother, brother, brother, brother, father, mother, and sister; HIV in his sister; Heart disease in his mother; Hypertension in his brother, brother, brother, brother, father, mother, and sister.   ROS:   Please see the history of present illness.    ROS All other systems reviewed and are negative.   PHYSICAL EXAM:   VS:  There were no vitals taken for this visit.   GEN: Well nourished, well developed, in no acute distress  HEENT: normal  Neck:  no JVD, carotid bruits, or masses Cardiac: ***RRR; no murmurs, rubs, or gallops,no edema  Respiratory:  clear to auscultation bilaterally, normal work of breathing GI: soft, nontender, nondistended, + BS MS: no deformity or atrophy  Skin: warm and dry, no rash Neuro:  Alert and Oriented x 3, Strength and sensation are intact Psych: euthymic mood, full affect  Wt Readings from Last 3 Encounters:  11/20/15 148 lb (67.1 kg)  07/15/15 150 lb (68 kg)  02/28/15 142 lb 12 oz (64.8 kg)      Studies/Labs Reviewed:   EKG:  EKG is*** ordered today.  The ekg ordered today demonstrates ***  Recent Labs: 07/01/2015: ALT 15; BUN 10; Creat 1.02; Hemoglobin 11.7; Platelets 206; Potassium 3.4; Sodium 140   Lipid Panel    Component Value Date/Time   CHOL 179 02/28/2015 1655    TRIG 186 (H) 02/28/2015 1655   HDL 41 02/28/2015 1655   CHOLHDL 4.4 02/28/2015 1655   VLDL 37 (H) 02/28/2015 1655   [place] 101 02/28/2015 1655    Additional studies/ records that were reviewed today include:  ***    ASSESSMENT:    No diagnosis found.   PLAN:  In order of problems listed above:  1. ***    Medication Adjustments/Labs and Tests Ordered: Current medicines are reviewed at length with the patient today.  Concerns regarding medicines are outlined above.  Medication changes, Labs and Tests ordered today are listed in the Patient Instructions below. There are no Patient Instructions on file for this visit.   Hilbert Corrigan, [place]  11/25/2015 6:30 AM    [address], [place], [place]  [address] Phone: [phone]; Fax: [phone]

## 2015-11-27 NOTE — Telephone Encounter (Signed)
Patient no showed for the appmt with Heart care on 11/25/2015. Follow up will be based on Dr. Algis Downs instructions

## 2015-11-27 NOTE — Telephone Encounter (Signed)
Can you call him to reschedule? thanks

## 2015-11-27 NOTE — Telephone Encounter (Signed)
Yes Sir I will

## 2015-12-02 NOTE — Telephone Encounter (Signed)
LM for pt to call me asap-3rd attempt-He still has referral for cardiologist and US/Abdomen

## 2015-12-02 NOTE — Telephone Encounter (Signed)
LM for pt also on 11/28/2015

## 2015-12-09 NOTE — Telephone Encounter (Signed)
LM for pt to call me regarding 2 referrals still pending-US/Abdomen and Cardiologist appmt ( he no showed 10/16)

## 2016-02-25 ENCOUNTER — Ambulatory Visit (HOSPITAL_COMMUNITY)
Admission: EM | Admit: 2016-02-25 | Discharge: 2016-02-25 | Disposition: A | Discharge status: Home / Self Care | Payer: Self-pay | Attending: Family Medicine | Admitting: Family Medicine

## 2016-02-25 ENCOUNTER — Encounter (HOSPITAL_COMMUNITY): Payer: Self-pay | Admitting: Emergency Medicine

## 2016-02-25 DIAGNOSIS — A084 Viral intestinal infection, unspecified: Secondary | ICD-10-CM

## 2016-02-25 MED ORDER — ONDANSETRON 4 MG PO TBDP: 4.0000 mg | ORAL_TABLET | Freq: Three times a day (TID) | ORAL | 0 refills | Status: DC | PRN

## 2016-02-25 NOTE — ED Provider Notes (Signed)
CSN: [id number]     Arrival date & time 02/25/16  1113 History   First MD Initiated Contact with Patient 02/25/16 1159     Chief Complaint  Patient presents with  . Nausea   (Consider location/radiation/quality/duration/timing/severity/associated sxs/prior Treatment) Patient c/o NVD   The history is provided by the patient.  Diarrhea  Quality:  Watery Severity:  Moderate Onset quality:  Sudden Duration:  2 days Timing:  Constant Progression:  Worsening Relieved by:  Nothing Worsened by:  Nothing Associated symptoms: arthralgias, fever and vomiting     Past Medical History:  Diagnosis Date  . Anxiety   . Asthma   . Depression   . HIV (human immunodeficiency virus infection) ([place])   . Hypertension    Past Surgical History:  Procedure Laterality Date  . COLONOSCOPY  02/12/2012   Procedure: COLONOSCOPY;  Surgeon: Leighton Ruff, MD;  Location: [facility];  Service: Endoscopy;  Laterality: N/A;  . HERNIA REPAIR     umbilibcal   Family History  Problem Relation Age of Onset  . Diabetes Mother   . Hypertension Mother   . Heart disease Mother   . Congestive Heart Failure Mother   . Diabetes Father   . Hypertension Father   . Diabetes Sister   . HIV Sister   . Hypertension Sister   . Diabetes Brother   . Hypertension Brother   . Diabetes Brother   . Hypertension Brother   . Diabetes Brother   . Hypertension Brother   . Diabetes Brother   . Hypertension Brother    Social History  Substance Use Topics  . Smoking status: Current Every Day Smoker    Packs/day: 0.20    Years: 15.00    Types: Cigarettes  . Smokeless tobacco: Never Used     Comment: trying to cut back  . Alcohol use 1.2 oz/week    1 Glasses of wine, 1 Shots of liquor per week     Comment: occasional    Review of Systems  Constitutional: Positive for fever.  HENT: Negative.   Eyes: Negative.   Respiratory: Negative.   Cardiovascular: Negative.   Gastrointestinal: Positive for diarrhea and  vomiting.  Endocrine: Negative.   Genitourinary: Negative.   Musculoskeletal: Positive for arthralgias.  Allergic/Immunologic: Negative.   Neurological: Negative.   Hematological: Negative.   Psychiatric/Behavioral: Negative.     Allergies  Patient has no known allergies.  Home Medications   Prior to Admission medications   Medication Sig Start Date End Date Taking? Authorizing Provider  amLODipine (NORVASC) 5 MG tablet Take 1 tablet (5 mg total) by mouth daily. 11/20/15  Yes Campbell Riches, MD  emtricitabine-rilpivir-tenofovir AF (ODEFSEY) 200-25-25 MG TABS tablet Take 1 tablet by mouth daily with breakfast. 06/25/15  Yes Thayer Headings, MD  lisinopril (PRINIVIL,ZESTRIL) 40 MG tablet Take 1 tablet (40 mg total) by mouth daily. 11/20/15  Yes Campbell Riches, MD  ondansetron (ZOFRAN ODT) 4 MG disintegrating tablet Take 1 tablet (4 mg total) by mouth every 8 (eight) hours as needed for nausea or vomiting. 02/25/16   Lysbeth Penner, FNP   Meds Ordered and Administered this Visit  Medications - No data to display  BP 155/85 (BP Location: Right Arm)   Pulse 62   Temp 98.4 F (36.9 C) (Oral)   Resp 16   SpO2 99%  No data found.   Physical Exam  Constitutional: He appears well-developed and well-nourished.  HENT:  Head: Normocephalic and atraumatic.  Eyes: Conjunctivae and  EOM are normal. Pupils are equal, round, and reactive to light.  Neck: Normal range of motion. Neck supple.  Cardiovascular: Normal rate, regular rhythm and normal heart sounds.   Pulmonary/Chest: Effort normal and breath sounds normal.  Abdominal: Soft. Bowel sounds are normal.  Nursing note and vitals reviewed.   Urgent Care Course   Clinical Course     Procedures (including critical care time)  Labs Review Labs Reviewed - No data to display  Imaging Review No results found.   Visual Acuity Review  Right Eye Distance:   Left Eye Distance:   Bilateral Distance:    Right Eye Near:    Left Eye Near:    Bilateral Near:         MDM   1. Viral gastroenteritis    Zofran 4mg  po tid prn #21  Push po fluids, rest, tylenol and motrin otc prn as directed for fever, arthralgias, and myalgias.  Follow up prn if sx's continue or persist.    Lysbeth Penner, FNP 02/25/16 [phone]

## 2016-02-25 NOTE — ED Triage Notes (Signed)
The patient presented to the [facility] with a complaint of N/V/D x 3 days.

## 2016-04-04 ENCOUNTER — Emergency Department (HOSPITAL_COMMUNITY): Payer: Self-pay

## 2016-04-04 ENCOUNTER — Encounter (HOSPITAL_COMMUNITY): Payer: Self-pay | Admitting: Emergency Medicine

## 2016-04-04 ENCOUNTER — Emergency Department (HOSPITAL_COMMUNITY)
Admission: EM | Admit: 2016-04-04 | Discharge: 2016-04-04 | Disposition: A | Discharge status: Home / Self Care | Payer: Self-pay | Attending: Emergency Medicine | Admitting: Emergency Medicine

## 2016-04-04 DIAGNOSIS — G4489 Other headache syndrome: Secondary | ICD-10-CM | POA: Insufficient documentation

## 2016-04-04 DIAGNOSIS — Z79899 Other long term (current) drug therapy: Secondary | ICD-10-CM | POA: Insufficient documentation

## 2016-04-04 DIAGNOSIS — J45909 Unspecified asthma, uncomplicated: Secondary | ICD-10-CM | POA: Insufficient documentation

## 2016-04-04 DIAGNOSIS — I10 Essential (primary) hypertension: Secondary | ICD-10-CM | POA: Insufficient documentation

## 2016-04-04 DIAGNOSIS — F17210 Nicotine dependence, cigarettes, uncomplicated: Secondary | ICD-10-CM | POA: Insufficient documentation

## 2016-04-04 LAB — CBC WITH DIFFERENTIAL/PLATELET
BASOS ABS: 0 10*3/uL (ref 0.0–0.1)
Basophils Relative: 0 %
EOS PCT: 3 %
Eosinophils Absolute: 0.1 10*3/uL (ref 0.0–0.7)
HCT: 39.3 % (ref 39.0–52.0)
Hemoglobin: 13.7 g/dL (ref 13.0–17.0)
LYMPHS PCT: 44 %
Lymphs Abs: 2 10*3/uL (ref 0.7–4.0)
MCH: 31.6 pg (ref 26.0–34.0)
MCHC: 34.9 g/dL (ref 30.0–36.0)
MCV: 90.8 fL (ref 78.0–100.0)
MONO ABS: 0.4 10*3/uL (ref 0.1–1.0)
MONOS PCT: 9 %
Neutro Abs: 2 10*3/uL (ref 1.7–7.7)
Neutrophils Relative %: 44 %
PLATELETS: 251 10*3/uL (ref 150–400)
RBC: 4.33 MIL/uL (ref 4.22–5.81)
RDW: 12.8 % (ref 11.5–15.5)
WBC: 4.6 10*3/uL (ref 4.0–10.5)

## 2016-04-04 LAB — COMPREHENSIVE METABOLIC PANEL
ALT: 28 U/L (ref 17–63)
ANION GAP: 7 (ref 5–15)
AST: 25 U/L (ref 15–41)
Albumin: 4.4 g/dL (ref 3.5–5.0)
Alkaline Phosphatase: 66 U/L (ref 38–126)
BILIRUBIN TOTAL: 0.6 mg/dL (ref 0.3–1.2)
BUN: 8 mg/dL (ref 6–20)
CHLORIDE: 106 mmol/L (ref 101–111)
CO2: 24 mmol/L (ref 22–32)
Calcium: 9.1 mg/dL (ref 8.9–10.3)
Creatinine, Ser: 0.94 mg/dL (ref 0.61–1.24)
GFR calc Af Amer: >60 mL/min (ref >60)
Glucose, Bld: 103 mg/dL — ABNORMAL HIGH (ref 65–99)
POTASSIUM: 3.3 mmol/L — AB (ref 3.5–5.1)
Sodium: 137 mmol/L (ref 135–145)
Total Protein: 8.2 g/dL — ABNORMAL HIGH (ref 6.5–8.1)

## 2016-04-04 LAB — SEDIMENTATION RATE: SED RATE: 5 mm/h (ref 0–16)

## 2016-04-04 MED ORDER — DEXAMETHASONE SODIUM PHOSPHATE 10 MG/ML IJ SOLN
4.0000 mg | Freq: Once | INTRAMUSCULAR | Status: AC
  Administered 2016-04-04: 4 mg via INTRAVENOUS
  Filled 2016-04-04: qty 1

## 2016-04-04 MED ORDER — MORPHINE SULFATE (PF) 4 MG/ML IV SOLN
4.0000 mg | Freq: Once | INTRAVENOUS | Status: AC
  Administered 2016-04-04: 4 mg via INTRAVENOUS
  Filled 2016-04-04: qty 1

## 2016-04-04 MED ORDER — IOPAMIDOL (ISOVUE-370) INJECTION 76%
100.0000 mL | Freq: Once | INTRAVENOUS | Status: AC | PRN
  Administered 2016-04-04: 80 mL via INTRAVENOUS

## 2016-04-04 MED ORDER — DIPHENHYDRAMINE HCL 50 MG/ML IJ SOLN
25.0000 mg | Freq: Once | INTRAMUSCULAR | Status: AC
  Administered 2016-04-04: 25 mg via INTRAVENOUS
  Filled 2016-04-04: qty 1

## 2016-04-04 MED ORDER — SUMATRIPTAN SUCCINATE 100 MG PO TABS: 100.0000 mg | ORAL_TABLET | ORAL | 0 refills | Status: DC | PRN

## 2016-04-04 MED ORDER — METOCLOPRAMIDE HCL 5 MG/ML IJ SOLN
10.0000 mg | Freq: Once | INTRAMUSCULAR | Status: AC
  Administered 2016-04-04: 10 mg via INTRAVENOUS
  Filled 2016-04-04: qty 2

## 2016-04-04 NOTE — Discharge Instructions (Signed)
Take tylenol for headaches.   Take imitrex for severe headaches.   See neurology for follow up if you have persistent headaches.   Return to ER if you have severe headaches, vomiting, fevers, blurry vision, eye pain, double vision.

## 2016-04-04 NOTE — ED Triage Notes (Signed)
Per pt, states he feels like a nail is sticking in the right side of head-states started last night, vomited once this am-sensitive to movement

## 2016-04-04 NOTE — ED Provider Notes (Signed)
[facility] DEPT Provider Note   CSN: [id number] Arrival date & time: 04/04/16  1038     History   Chief Complaint Chief Complaint  Patient presents with  . Headache    HPI Ryan Cruz is a 51 y.o. male hx of HIV (CD 4 500, Quant undetectable in Oct 2017), Hypertension, Here presenting with headache. Patient states that he had acute onset of headache last night he went to bed. He felt it was a stabbing sensation on the right side of his head that felt like somebody drove a nail into his head. Associated with one episode of vomiting denies any blurry vision. He had previous headaches before but not as severe as this time. No distal history of aneurysm but his family had aortic aneurysm but no cerebral aneurysms. Patient states that he has HIV and is compliant with his HIV medicine. Also has a history of hypertension and took his medicine this morning.   The history is provided by the patient.    Past Medical History:  Diagnosis Date  . Anxiety   . Asthma   . Depression   . HIV (human immunodeficiency virus infection) ([place])   . Hypertension     Patient Active Problem List   Diagnosis Date Noted  . Chest pain 11/20/2015  . Cerumen impaction 01/29/2014  . Hypoglycemia 01/29/2014  . Allergic sinusitis 06/29/2012  . Pilonidal cyst 01/04/2012  . Depression 06/25/2010  . 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 Essential hypertension 12/12/2009  . Ventral hernia 03/28/2009  . NEUROPATHY, NOS 04/30/2008  . DENTAL CARIES 12/14/2007  . TOBACCO USER 06/01/2007  . VERTIGO 05/13/2007  . ACUTE BRONCHITIS 12/21/2006  . LYMPHADENOPATHY 07/06/2006  . Human immunodeficiency virus (HIV) disease ([place]) 02/24/2006  . HYPERLIPIDEMIA 02/24/2006  . Rectal bleeding 11/19/2005  . ASTHMA 12/11/1994    Past Surgical History:  Procedure Laterality Date  . COLONOSCOPY  02/12/2012   Procedure: COLONOSCOPY;  Surgeon: Leighton Ruff, MD;  Location: [facility];  Service: Endoscopy;  Laterality:  N/A;  . HERNIA REPAIR     umbilibcal       Home Medications    Prior to Admission medications   Medication Sig Start Date End Date Taking? Authorizing Provider  amLODipine (NORVASC) 5 MG tablet Take 1 tablet (5 mg total) by mouth daily. 11/20/15  Yes Campbell Riches, MD  emtricitabine-rilpivir-tenofovir AF (ODEFSEY) 200-25-25 MG TABS tablet Take 1 tablet by mouth daily with breakfast. 06/25/15  Yes Thayer Headings, MD  lisinopril (PRINIVIL,ZESTRIL) 40 MG tablet Take 1 tablet (40 mg total) by mouth daily. 11/20/15  Yes Campbell Riches, MD  ondansetron (ZOFRAN ODT) 4 MG disintegrating tablet Take 1 tablet (4 mg total) by mouth every 8 (eight) hours as needed for nausea or vomiting. Patient not taking: Reported on 04/04/2016 02/25/16   Lysbeth Penner, FNP    Family History Family History  Problem Relation Age of Onset  . Diabetes Mother   . Hypertension Mother   . Heart disease Mother   . Congestive Heart Failure Mother   . Diabetes Father   . Hypertension Father   . Diabetes Sister   . HIV Sister   . Hypertension Sister   . Diabetes Brother   . Hypertension Brother   . Diabetes Brother   . Hypertension Brother   . Diabetes Brother   . Hypertension Brother   . Diabetes Brother   . Hypertension Brother     Social History Social History  Substance Use Topics  .  Smoking status: Current Every Day Smoker    Packs/day: 0.20    Years: 15.00    Types: Cigarettes  . Smokeless tobacco: Never Used     Comment: trying to cut back  . Alcohol use 1.2 oz/week    1 Glasses of wine, 1 Shots of liquor per week     Comment: occasional     Allergies   Patient has no known allergies.   Review of Systems Review of Systems  Gastrointestinal: Positive for vomiting.  Neurological: Positive for headaches.  All other systems reviewed and are negative.    Physical Exam Updated Vital Signs BP 140/96   Pulse 77   Temp 98.2 F (36.8 C)   Resp 16   SpO2 97%   Physical Exam   Constitutional: He is oriented to person, place, and time.  Uncomfortable   HENT:  Head: Normocephalic.  Mild R temporal area tenderness   Eyes: EOM are normal. Pupils are equal, round, and reactive to light.  Grossly no papilledema [name] on fundoscopic exam   Neck: Normal range of motion. Neck supple.  Cardiovascular: Normal rate, regular rhythm and normal heart sounds.   Pulmonary/Chest: Effort normal and breath sounds normal. No respiratory distress. He has no wheezes. He has no rales.  Abdominal: Soft. Bowel sounds are normal. He exhibits no distension. There is no tenderness.  Musculoskeletal: Normal range of motion.  Neurological: He is alert and oriented to person, place, and time.  CN 2-12 intact, nl strength and sensation throughout   Skin: Skin is warm.  Nursing note and vitals reviewed.    ED Treatments / Results  Labs (all labs ordered are listed, but only abnormal results are displayed) Labs Reviewed  COMPREHENSIVE METABOLIC PANEL - Abnormal; Notable for the following:       Result Value   Potassium 3.3 (*)    Glucose, Bld 103 (*)    Total Protein 8.2 (*)    All other components within normal limits  CBC WITH DIFFERENTIAL/PLATELET  SEDIMENTATION RATE    EKG  EKG Interpretation None       Radiology Ct Angio Head W Or Wo Contrast  Result Date: 04/04/2016 CLINICAL DATA:  Headache. Sharp pain in right side of head. Feels like a nail is sticking into the right side of his head. EXAM: CT ANGIOGRAPHY HEAD AND NECK TECHNIQUE: Multidetector CT imaging of the head and neck was performed using the standard protocol during bolus administration of intravenous contrast. Multiplanar CT image reconstructions and MIPs were obtained to evaluate the vascular anatomy. Carotid stenosis measurements (when applicable) are obtained utilizing NASCET criteria, using the distal internal carotid diameter as the denominator. CONTRAST:  80 mL Isovue 370 COMPARISON:  MRI of the cervical  spine 05/09/2008. CT of the head 05/17/2007 FINDINGS: CT HEAD FINDINGS Brain: No acute infarct, hemorrhage, or mass lesion is present. The ventricles are of normal size. No significant extraaxial fluid collection is present. No significant white matter disease is present. The brainstem and cerebellum are unremarkable. Vascular: No hyperdense vessel or unexpected calcification. Skull: The calvarium is within normal limits. No focal lytic or blastic lesions are present. Sinuses: The paranasal sinuses and mastoid air cells are clear. Orbits: Bilateral globes and orbits are unremarkable. Review of the MIP images confirms the above findings CTA NECK FINDINGS Aortic arch: Minimal atherosclerotic calcifications are present in the distal arch. There is no significant calcification at the origins the great vessels. A 3 vessel configuration is present. Right carotid system: Right  common carotid artery is within normal limits. Right carotid bifurcation is normal. The cervical right ICA is unremarkable. Left carotid system: The left common carotid artery is within normal limits. Bifurcation is normal. The cervical left ICA is normal. Vertebral arteries: The vertebral arteries originate from the subclavian arteries bilaterally. The right vertebral artery is the dominant vessel. There is no focal stenosis or vascular injury in the neck. Skeleton: Endplate degenerative change is present at C4-5 and C6-7. Osseous foraminal narrowing is present on the left at C6-7 and C7-T1. Other neck: The soft tissues the neck are unremarkable. No focal mucosal or submucosal lesions are present. Salivary glands are normal. The thyroid is normal. Level 2 lymph nodes appear reactive. Upper chest: The lung apices are clear. The superior mediastinum is unremarkable. Review of the MIP images confirms the above findings CTA HEAD FINDINGS Anterior circulation: Minimal calcifications are present within the cavernous internal carotid arteries. There is no  focal stenosis. The ICA termini are intact. The A1 and M1 segments are normal. Anterior communicating artery is patent. MCA bifurcations are intact. ACA and MCA branch vessels are within normal limits. Posterior circulation: The right vertebral artery is the dominant vessel. PICA origins are visualized and normal. The vertebrobasilar junction is normal. Both posterior cerebral arteries originate from the basilar tip. The PCA branch vessels are normal. Venous sinuses: The dural sinuses are patent. The transverse sinuses are codominant. The straight sinus and deep cerebral veins are intact. Cortical veins are unremarkable. Anatomic variants: None Delayed phase: The postcontrast images demonstrate no pathologic enhancement. Review of the MIP images confirms the above findings IMPRESSION: 1. Minimal atherosclerotic changes at the aortic arch and cavernous internal carotid arteries without significant stenosis. 2. Mild endplate degenerative changes in the cervical spine with left foraminal narrowing at C6-7 and C7-T1. 3. No significant proximal stenosis, aneurysm, or branch vessel occlusion within the circle of Willis. Electronically Signed   By: San Morelle M.D.   On: 04/04/2016 13:39   Ct Angio Neck W And/or Wo Contrast  Result Date: 04/04/2016 CLINICAL DATA:  Headache. Sharp pain in right side of head. Feels like a nail is sticking into the right side of his head. EXAM: CT ANGIOGRAPHY HEAD AND NECK TECHNIQUE: Multidetector CT imaging of the head and neck was performed using the standard protocol during bolus administration of intravenous contrast. Multiplanar CT image reconstructions and MIPs were obtained to evaluate the vascular anatomy. Carotid stenosis measurements (when applicable) are obtained utilizing NASCET criteria, using the distal internal carotid diameter as the denominator. CONTRAST:  80 mL Isovue 370 COMPARISON:  MRI of the cervical spine 05/09/2008. CT of the head 05/17/2007 FINDINGS: CT  HEAD FINDINGS Brain: No acute infarct, hemorrhage, or mass lesion is present. The ventricles are of normal size. No significant extraaxial fluid collection is present. No significant white matter disease is present. The brainstem and cerebellum are unremarkable. Vascular: No hyperdense vessel or unexpected calcification. Skull: The calvarium is within normal limits. No focal lytic or blastic lesions are present. Sinuses: The paranasal sinuses and mastoid air cells are clear. Orbits: Bilateral globes and orbits are unremarkable. Review of the MIP images confirms the above findings CTA NECK FINDINGS Aortic arch: Minimal atherosclerotic calcifications are present in the distal arch. There is no significant calcification at the origins the great vessels. A 3 vessel configuration is present. Right carotid system: Right common carotid artery is within normal limits. Right carotid bifurcation is normal. The cervical right ICA is unremarkable. Left carotid system: The  left common carotid artery is within normal limits. Bifurcation is normal. The cervical left ICA is normal. Vertebral arteries: The vertebral arteries originate from the subclavian arteries bilaterally. The right vertebral artery is the dominant vessel. There is no focal stenosis or vascular injury in the neck. Skeleton: Endplate degenerative change is present at C4-5 and C6-7. Osseous foraminal narrowing is present on the left at C6-7 and C7-T1. Other neck: The soft tissues the neck are unremarkable. No focal mucosal or submucosal lesions are present. Salivary glands are normal. The thyroid is normal. Level 2 lymph nodes appear reactive. Upper chest: The lung apices are clear. The superior mediastinum is unremarkable. Review of the MIP images confirms the above findings CTA HEAD FINDINGS Anterior circulation: Minimal calcifications are present within the cavernous internal carotid arteries. There is no focal stenosis. The ICA termini are intact. The A1 and M1  segments are normal. Anterior communicating artery is patent. MCA bifurcations are intact. ACA and MCA branch vessels are within normal limits. Posterior circulation: The right vertebral artery is the dominant vessel. PICA origins are visualized and normal. The vertebrobasilar junction is normal. Both posterior cerebral arteries originate from the basilar tip. The PCA branch vessels are normal. Venous sinuses: The dural sinuses are patent. The transverse sinuses are codominant. The straight sinus and deep cerebral veins are intact. Cortical veins are unremarkable. Anatomic variants: None Delayed phase: The postcontrast images demonstrate no pathologic enhancement. Review of the MIP images confirms the above findings IMPRESSION: 1. Minimal atherosclerotic changes at the aortic arch and cavernous internal carotid arteries without significant stenosis. 2. Mild endplate degenerative changes in the cervical spine with left foraminal narrowing at C6-7 and C7-T1. 3. No significant proximal stenosis, aneurysm, or branch vessel occlusion within the circle of Willis. Electronically Signed   By: San Morelle M.D.   On: 04/04/2016 13:39    Procedures Procedures (including critical care time)  Medications Ordered in ED Medications  morphine 4 MG/ML injection 4 mg (4 mg Intravenous Given 04/04/16 1113)  metoCLOPramide (REGLAN) injection 10 mg (10 mg Intravenous Given 04/04/16 1113)  diphenhydrAMINE (BENADRYL) injection 25 mg (25 mg Intravenous Given 04/04/16 1114)  dexamethasone (DECADRON) injection 4 mg (4 mg Intravenous Given 04/04/16 1113)  iopamidol (ISOVUE-370) 76 % injection 100 mL (80 mLs Intravenous Contrast Given 04/04/16 1255)     Initial Impression / Assessment and Plan / ED Course  I have reviewed the triage vital signs and the nursing notes.  Pertinent labs & imaging results that were available during my care of the patient were reviewed by me and considered in my medical decision making (see  chart for details).     Ryan Cruz is a 51 y.o. male here with headaches. Has mild R temporal tenderness. Most likely migraines. But will consider temporal arteritis vs aneurysm rupture. He is out of the 6 hr window but symptoms still within 12 hrs so will get CT angio head/neck and if showed no aneurysm, will not need LP. Will give migraine cocktail and also check labs, ESR.   2:04 PM WBC nl. ESR 5. CT showed no aneurysm. Headache controlled with migraine cocktail and felt better. Likely bad migraines. Low suspicion for temporal arteritis and patient has nl ESR. Will dc home with imitrex, neurology follow up.    Final Clinical Impressions(s) / ED Diagnoses   Final diagnoses:  None    New Prescriptions New Prescriptions   No medications on file     Drenda Freeze, MD 04/04/16 1406

## 2016-04-06 ENCOUNTER — Telehealth: Payer: Self-pay | Admitting: *Deleted

## 2016-04-06 NOTE — Telephone Encounter (Signed)
Called patient and asked that he return the call for lab results. Please see result note.

## 2016-04-08 ENCOUNTER — Telehealth: Payer: Self-pay | Admitting: *Deleted

## 2016-04-08 ENCOUNTER — Emergency Department (HOSPITAL_COMMUNITY): Payer: Self-pay

## 2016-04-08 ENCOUNTER — Encounter (HOSPITAL_COMMUNITY): Payer: Self-pay | Admitting: Family Medicine

## 2016-04-08 ENCOUNTER — Emergency Department (HOSPITAL_COMMUNITY)
Admission: EM | Admit: 2016-04-08 | Discharge: 2016-04-08 | Disposition: A | Discharge status: Home / Self Care | Payer: Self-pay | Attending: Emergency Medicine | Admitting: Emergency Medicine

## 2016-04-08 DIAGNOSIS — H5711 Ocular pain, right eye: Secondary | ICD-10-CM | POA: Insufficient documentation

## 2016-04-08 DIAGNOSIS — R51 Headache: Secondary | ICD-10-CM | POA: Insufficient documentation

## 2016-04-08 DIAGNOSIS — F17210 Nicotine dependence, cigarettes, uncomplicated: Secondary | ICD-10-CM | POA: Insufficient documentation

## 2016-04-08 DIAGNOSIS — R519 Headache, unspecified: Secondary | ICD-10-CM

## 2016-04-08 DIAGNOSIS — I10 Essential (primary) hypertension: Secondary | ICD-10-CM | POA: Insufficient documentation

## 2016-04-08 DIAGNOSIS — J45909 Unspecified asthma, uncomplicated: Secondary | ICD-10-CM | POA: Insufficient documentation

## 2016-04-08 MED ORDER — PROCHLORPERAZINE EDISYLATE 5 MG/ML IJ SOLN
10.0000 mg | Freq: Once | INTRAMUSCULAR | Status: AC
  Administered 2016-04-08: 10 mg via INTRAVENOUS
  Filled 2016-04-08: qty 2

## 2016-04-08 MED ORDER — HYDROCODONE-ACETAMINOPHEN 5-325 MG PO TABS: 1.0000 | ORAL_TABLET | ORAL | 0 refills | Status: DC | PRN

## 2016-04-08 MED ORDER — ACYCLOVIR 800 MG PO TABS: 800.0000 mg | ORAL_TABLET | Freq: Every day | ORAL | 0 refills | Status: DC

## 2016-04-08 MED ORDER — DIPHENHYDRAMINE HCL 50 MG/ML IJ SOLN
25.0000 mg | Freq: Once | INTRAMUSCULAR | Status: AC
  Administered 2016-04-08: 25 mg via INTRAVENOUS
  Filled 2016-04-08: qty 1

## 2016-04-08 MED ORDER — FLUORESCEIN SODIUM 0.6 MG OP STRP
1.0000 | ORAL_STRIP | Freq: Once | OPHTHALMIC | Status: AC
  Administered 2016-04-08: 1 via OPHTHALMIC
  Filled 2016-04-08: qty 1

## 2016-04-08 MED ORDER — TETRACAINE HCL 0.5 % OP SOLN
1.0000 [drp] | Freq: Once | OPHTHALMIC | Status: AC
  Administered 2016-04-08: 1 [drp] via OPHTHALMIC
  Filled 2016-04-08: qty 4

## 2016-04-08 MED ORDER — SODIUM CHLORIDE 0.9 % IV BOLUS (SEPSIS)
1000.0000 mL | Freq: Once | INTRAVENOUS | Status: AC
  Administered 2016-04-08: 1000 mL via INTRAVENOUS

## 2016-04-08 MED ORDER — KETOROLAC TROMETHAMINE 30 MG/ML IJ SOLN
30.0000 mg | Freq: Once | INTRAMUSCULAR | Status: AC
  Administered 2016-04-08: 30 mg via INTRAVENOUS
  Filled 2016-04-08: qty 1

## 2016-04-08 NOTE — ED Provider Notes (Signed)
[facility] DEPT Provider Note   CSN: [id number] Arrival date & time: 04/08/16  1608     History   Chief Complaint Chief Complaint  Patient presents with  . Headache  . Eye Pain    HPI Ryan Cruz is a 51 y.o. male with a pmh of HIV (CD4 500, quant undetectable in 11/2015), He presents emergency Department for chief complaint of headache and eye pain. The patient was seen on 04/04/2016 with similar headache, stabbing pain to his right temple. He had a CT angiogram of his head and neck, which showed no significant abnormalities and he had a normal sedimentation rate. Patient complains of pain in his right eye associated with this headache which began yesterday. He also complains of some blurry vision on the right peripheral field. He has associated photosensitivity, but denies nausea or vomiting. His headache did improve with usual migraine cocktail at his last visit. Worsened over the next couple days. He denies fevers, chills. He is taking his medications for HIV and is compliant.  HPI  Past Medical History:  Diagnosis Date  . Anxiety   . Asthma   . Depression   . HIV (human immunodeficiency virus infection) ([place])   . Hypertension     Patient Active Problem List   Diagnosis Date Noted  . Chest pain 11/20/2015  . Cerumen impaction 01/29/2014  . Hypoglycemia 01/29/2014  . Allergic sinusitis 06/29/2012  . Pilonidal cyst 01/04/2012  . Depression 06/25/2010  . 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 Essential hypertension 12/12/2009  . Ventral hernia 03/28/2009  . NEUROPATHY, NOS 04/30/2008  . DENTAL CARIES 12/14/2007  . TOBACCO USER 06/01/2007  . VERTIGO 05/13/2007  . ACUTE BRONCHITIS 12/21/2006  . LYMPHADENOPATHY 07/06/2006  . Human immunodeficiency virus (HIV) disease ([place]) 02/24/2006  . HYPERLIPIDEMIA 02/24/2006  . Rectal bleeding 11/19/2005  . ASTHMA 12/11/1994    Past Surgical History:  Procedure Laterality Date  . COLONOSCOPY  02/12/2012   Procedure:  COLONOSCOPY;  Surgeon: Leighton Ruff, MD;  Location: [facility];  Service: Endoscopy;  Laterality: N/A;  . HERNIA REPAIR     umbilibcal       Home Medications    Prior to Admission medications   Medication Sig Start Date End Date Taking? Authorizing Provider  amLODipine (NORVASC) 5 MG tablet Take 1 tablet (5 mg total) by mouth daily. 11/20/15  Yes Campbell Riches, MD  emtricitabine-rilpivir-tenofovir AF (ODEFSEY) 200-25-25 MG TABS tablet Take 1 tablet by mouth daily with breakfast. 06/25/15  Yes Thayer Headings, MD  lisinopril (PRINIVIL,ZESTRIL) 40 MG tablet Take 1 tablet (40 mg total) by mouth daily. 11/20/15  Yes Campbell Riches, MD  pseudoephedrine-acetaminophen (TYLENOL SINUS) 30-500 MG TABS tablet Take 1 tablet by mouth every 4 (four) hours as needed (sinus congestion).   Yes Historical Provider, MD  SUMAtriptan (IMITREX) 100 MG tablet Take 1 tablet (100 mg total) by mouth every 2 (two) hours as needed for migraine. May repeat in 2 hours if headache persists or recurs. 04/04/16  Yes Drenda Freeze, MD  ondansetron (ZOFRAN ODT) 4 MG disintegrating tablet Take 1 tablet (4 mg total) by mouth every 8 (eight) hours as needed for nausea or vomiting. Patient not taking: Reported on 04/04/2016 02/25/16   Lysbeth Penner, FNP    Family History Family History  Problem Relation Age of Onset  . Diabetes Mother   . Hypertension Mother   . Heart disease Mother   . Congestive Heart Failure Mother   . Diabetes Father   .  Hypertension Father   . Diabetes Sister   . HIV Sister   . Hypertension Sister   . Diabetes Brother   . Hypertension Brother   . Diabetes Brother   . Hypertension Brother   . Diabetes Brother   . Hypertension Brother   . Diabetes Brother   . Hypertension Brother     Social History Social History  Substance Use Topics  . Smoking status: Current Every Day Smoker    Packs/day: 0.20    Years: 15.00    Types: Cigarettes  . Smokeless tobacco: Never Used      Comment: trying to cut back  . Alcohol use 1.2 oz/week    1 Glasses of wine, 1 Shots of liquor per week     Comment: occasional     Allergies   Patient has no known allergies.   Review of Systems Review of Systems Ten systems reviewed and are negative for acute change, except as noted in the HPI.    Physical Exam Updated Vital Signs BP (!) 157/104   Pulse 67   Temp 98.8 F (37.1 C) (Oral)   Resp 18   Ht 5\' 8"  (1.727 m)   Wt 67.6 kg   SpO2 99%   BMI 22.66 kg/m   Physical Exam  Constitutional: He is oriented to person, place, and time. He appears well-developed and well-nourished. No distress.  HENT:  Head: Normocephalic and atraumatic.    Mouth/Throat: Oropharynx is clear and moist.  Eyes: Conjunctivae, EOM and lids are normal. No scleral icterus.  Fundoscopic exam:      The right eye shows no hemorrhage and no papilledema.       The left eye shows no hemorrhage and no papilledema.  Slit lamp exam:      The right eye shows no corneal flare and no fluorescein uptake.       The left eye shows no corneal flare and no fluorescein uptake.  No horizontal, vertical or rotational nystagmus Pressures OD:15 OS:17  Neck: Normal range of motion. Neck supple.  Full active and passive ROM without pain No midline or paraspinal tenderness No nuchal rigidity or meningeal signs  Cardiovascular: Normal rate, regular rhythm and intact distal pulses.   Pulmonary/Chest: Effort normal and breath sounds normal. No respiratory distress. He has no wheezes. He has no rales.  Abdominal: Soft. Bowel sounds are normal. There is no tenderness. There is no rebound and no guarding.  Musculoskeletal: Normal range of motion.  Lymphadenopathy:    He has no cervical adenopathy.  Neurological: He is alert and oriented to person, place, and time. No cranial nerve deficit. He exhibits normal muscle tone. Coordination normal.  Mental Status:  Alert, oriented, thought content appropriate. Speech  fluent without evidence of aphasia. Able to follow 2 step commands without difficulty.  Cranial Nerves:  II:  Peripheral visual fields grossly normal, pupils equal, round, reactive to light III,IV, VI: ptosis not present, extra-ocular motions intact bilaterally  V,VII: smile symmetric, facial light touch sensation equal VIII: hearing grossly normal bilaterally  IX,X: midline uvula rise  XI: bilateral shoulder shrug equal and strong XII: midline tongue extension  Motor:  5/5 in upper and lower extremities bilaterally including strong and equal grip strength and dorsiflexion/plantar flexion Sensory: Pinprick and light touch normal in all extremities.  Cerebellar: normal finger-to-nose with bilateral upper extremities Gait: normal gait and balance CV: distal pulses palpable throughout   Skin: Skin is warm and dry. No rash noted. He is not diaphoretic.  Psychiatric: He has a normal mood and affect. His behavior is normal. Judgment and thought content normal.  Nursing note and vitals reviewed.    ED Treatments / Results  Labs (all labs ordered are listed, but only abnormal results are displayed) Labs Reviewed - No data to display  EKG  EKG Interpretation None       Radiology No results found.  Procedures Procedures (including critical care time)  Medications Ordered in ED Medications  fluorescein ophthalmic strip 1 strip (1 strip Right Eye Given 04/08/16 1950)  sodium chloride 0.9 % bolus 1,000 mL (1,000 mLs Intravenous New Bag/Given 04/08/16 1944)  prochlorperazine (COMPAZINE) injection 10 mg (10 mg Intravenous Given 04/08/16 1947)  diphenhydrAMINE (BENADRYL) injection 25 mg (25 mg Intravenous Given 04/08/16 1950)  ketorolac (TORADOL) 30 MG/ML injection 30 mg (30 mg Intravenous Given 04/08/16 1945)  tetracaine (PONTOCAINE) 0.5 % ophthalmic solution 1 drop (1 drop Both Eyes Given 04/08/16 2003)     Initial Impression / Assessment and Plan / ED Course  I have reviewed the  triage vital signs and the nursing notes.  Pertinent labs & imaging results that were available during my care of the patient were reviewed by me and considered in my medical decision making (see chart for details).    Patient with headache, eye pain. Normal pressures, no signs of fluorescein uptake, no signs of herpes keratitis. CT of the orbits is negative. The patient is exquisitely tender along the scalp. I have concern for potential developing or un-ruptured zoster. I will treat the patient with acyclovir and give the patient pain medication. Advised patient to follow up with his PCP and neurology. Patient appears safe for discharge at this time.  Pt HA treated and improved while in ED.  Presentation isconcerning for [facility], Meningitis, or temporal arteritis. Pt is afebrile with no focal neuro deficits, nuchal rigidity, or change in vision. Pt is to follow up with PCP to discuss prophylactic medication. Pt verbalizes understanding and is agreeable with plan to dc.    Final Clinical Impressions(s) / ED Diagnoses   Final diagnoses:  Bad headache  Eye pain, right    New Prescriptions New Prescriptions   No medications on file     Margarita Mail, PA-C 04/08/16 [place], MD 04/08/16 2329

## 2016-04-08 NOTE — ED Notes (Signed)
Pt sleepy during eye exam

## 2016-04-08 NOTE — Telephone Encounter (Signed)
Patient was returning Jackie's call regarding lab results. He reports still having incredible headache that is worsening, now affecting his vision. Patient stated he was scared. He took imitrex after ED discharge last week but it did not help. Patient will go to the ED again.   He was informed about his low potassium as well.  Landis Gandy, RN  Campbell Riches, MD  P [facility] Triage Nurse Pool        Pt needs KCL 37meq po times one and then repeat BMP in 24h.  thanks

## 2016-04-08 NOTE — Discharge Instructions (Signed)
You may have developin shingles . I am dischargingh you with treatment for this.  You may take it beginning tomorrow.  I am also discharging you with pain medicine.   You are having a headache. No specific cause was found today for your headache. It may have been a migraine or other cause of headache. Stress, anxiety, fatigue, and depression are common triggers for headaches. Your headache today does not appear to be life-threatening or require hospitalization, but often the exact cause of headaches is not determined in the emergency department. Therefore, follow-up with your doctor is very important to find out what may have caused your headache, and whether or not you need any further diagnostic testing or treatment. Sometimes headaches can appear benign (not harmful), but then more serious symptoms can develop which should prompt an immediate re-evaluation by your doctor or the emergency department. SEEK MEDICAL ATTENTION IF: You develop possible problems with medications prescribed.  The medications don't resolve your headache, if it recurs , or if you have multiple episodes of vomiting or can't take fluids. You have a change from the usual headache. RETURN IMMEDIATELY IF you develop a sudden, severe headache or confusion, become poorly responsive or faint, develop a fever above 100.29F or problem breathing, have a change in speech, vision, swallowing, or understanding, or develop new weakness, numbness, tingling, incoordination, or have a seizure.

## 2016-04-08 NOTE — Telephone Encounter (Signed)
Thank you :)

## 2016-04-08 NOTE — ED Notes (Signed)
Bed: [facility] Expected date:  Expected time:  Means of arrival:  Comments: 

## 2016-04-08 NOTE — ED Triage Notes (Signed)
Patient reports he was at [facility] ED on Saturday for a headache. Pt reports today he started experiencing intense right eye pain with visual disturbances. Pt reports he has been taking his prescribed medication from Saturday night visit.

## 2016-04-13 ENCOUNTER — Other Ambulatory Visit (INDEPENDENT_AMBULATORY_CARE_PROVIDER_SITE_OTHER): Payer: Self-pay

## 2016-04-13 ENCOUNTER — Ambulatory Visit: Payer: Self-pay

## 2016-04-13 DIAGNOSIS — B20 Human immunodeficiency virus [HIV] disease: Secondary | ICD-10-CM

## 2016-04-13 LAB — COMPLETE METABOLIC PANEL WITH GFR
ALT: 33 U/L (ref 9–46)
AST: 29 U/L (ref 10–40)
Albumin: 4.1 g/dL (ref 3.6–5.1)
Alkaline Phosphatase: 65 U/L (ref 40–115)
BUN: 11 mg/dL (ref 7–25)
CHLORIDE: 106 mmol/L (ref 98–110)
CO2: 27 mmol/L (ref 20–31)
CREATININE: 1.22 mg/dL (ref 0.60–1.35)
Calcium: 9.3 mg/dL (ref 8.6–10.3)
GFR, Est African American: 81 mL/min (ref >=60)
GFR, Est Non African American: 70 mL/min (ref >=60)
GLUCOSE: 86 mg/dL (ref 65–99)
Potassium: 3.8 mmol/L (ref 3.5–5.3)
Sodium: 140 mmol/L (ref 135–146)
Total Bilirubin: 0.4 mg/dL (ref 0.2–1.2)
Total Protein: 7.3 g/dL (ref 6.1–8.1)

## 2016-04-13 LAB — CBC
HCT: 37.3 % — ABNORMAL LOW (ref 38.5–50.0)
Hemoglobin: 12.6 g/dL — ABNORMAL LOW (ref 13.2–17.1)
MCH: 31.5 pg (ref 27.0–33.0)
MCHC: 33.8 g/dL (ref 32.0–36.0)
MCV: 93.3 fL (ref 80.0–100.0)
MPV: 9.2 fL (ref 7.5–12.5)
PLATELETS: 281 10*3/uL (ref 140–400)
RBC: 4 MIL/uL — ABNORMAL LOW (ref 4.20–5.80)
RDW: 13.9 % (ref 11.0–15.0)
WBC: 6.3 10*3/uL (ref 3.8–10.8)

## 2016-04-14 ENCOUNTER — Encounter: Payer: Self-pay | Admitting: Physician Assistant

## 2016-04-14 ENCOUNTER — Encounter: Payer: Self-pay | Admitting: *Deleted

## 2016-04-14 ENCOUNTER — Telehealth: Payer: Self-pay | Admitting: *Deleted

## 2016-04-14 ENCOUNTER — Encounter: Payer: Self-pay | Admitting: Infectious Diseases

## 2016-04-14 LAB — T-HELPER CELL (CD4) - (RCID CLINIC ONLY)
CD4 T CELL ABS: 620 /uL (ref 400–2700)
CD4 T CELL HELPER: 19 % — AB (ref 33–55)

## 2016-04-14 NOTE — Telephone Encounter (Signed)
Patient called back and said he has his own opthalmologist and will schedule his appointment. He is requesting an Rx for pain. He states his head and eye are hurting. He was given Vicodin at the ED and said he only has two pills left.

## 2016-04-14 NOTE — Telephone Encounter (Addendum)
Patient sent a mychart message stating he is having vision changes and worried he is losing his sight in his right eye. He was seen at the ED recently and was told he needed to see a neurologist. He would also like to see an [place] to make these referrals? Myrtis Hopping

## 2016-04-15 ENCOUNTER — Telehealth: Payer: Self-pay

## 2016-04-15 LAB — HIV-1 RNA,QN PCR W/REFLEX GENOTYPE
HIV-1 RNA, QN PCR: 4.84 Log cps/mL — ABNORMAL HIGH
HIV-1 RNA, QN PCR: 69300 Copies/mL — ABNORMAL HIGH

## 2016-04-15 NOTE — Telephone Encounter (Signed)
Patient has called the office with request of return to work note.  He has been out of work due to multiple episodes of migraines and blurred vision in right eye. He has been to the ED twice for this problem.  He reports he is taking lisinopril and his blood pressures have been elevated the last few times he checked which were : 1580/90.  He is scheduled to opthalmology on Thursday and will call our office after visit for note to return to work if he cleared by physician.    He will need note to return to work for Friday, April 17, 2016.

## 2016-04-15 NOTE — Telephone Encounter (Signed)
Will need to get from optho

## 2016-04-16 ENCOUNTER — Encounter: Payer: Self-pay | Admitting: Infectious Diseases

## 2016-04-16 ENCOUNTER — Inpatient Hospital Stay (HOSPITAL_COMMUNITY)
Admission: EM | Admit: 2016-04-16 | Discharge: 2016-04-22 | DRG: 123 | Disposition: A | Discharge status: Home / Self Care | Payer: Self-pay | Attending: Internal Medicine | Admitting: Internal Medicine

## 2016-04-16 ENCOUNTER — Encounter (HOSPITAL_COMMUNITY): Payer: Self-pay | Admitting: Emergency Medicine

## 2016-04-16 ENCOUNTER — Emergency Department (HOSPITAL_COMMUNITY): Payer: Self-pay

## 2016-04-16 DIAGNOSIS — G4489 Other headache syndrome: Secondary | ICD-10-CM

## 2016-04-16 DIAGNOSIS — I10 Essential (primary) hypertension: Secondary | ICD-10-CM | POA: Diagnosis present

## 2016-04-16 DIAGNOSIS — F419 Anxiety disorder, unspecified: Secondary | ICD-10-CM | POA: Diagnosis present

## 2016-04-16 DIAGNOSIS — B20 Human immunodeficiency virus [HIV] disease: Secondary | ICD-10-CM | POA: Diagnosis present

## 2016-04-16 DIAGNOSIS — Z66 Do not resuscitate: Secondary | ICD-10-CM | POA: Diagnosis present

## 2016-04-16 DIAGNOSIS — F17210 Nicotine dependence, cigarettes, uncomplicated: Secondary | ICD-10-CM | POA: Diagnosis present

## 2016-04-16 DIAGNOSIS — H469 Unspecified optic neuritis: Principal | ICD-10-CM | POA: Diagnosis present

## 2016-04-16 DIAGNOSIS — Z9119 Patient's noncompliance with other medical treatment and regimen: Secondary | ICD-10-CM

## 2016-04-16 DIAGNOSIS — H538 Other visual disturbances: Secondary | ICD-10-CM

## 2016-04-16 DIAGNOSIS — Z79899 Other long term (current) drug therapy: Secondary | ICD-10-CM

## 2016-04-16 DIAGNOSIS — H5461 Unqualified visual loss, right eye, normal vision left eye: Secondary | ICD-10-CM

## 2016-04-16 DIAGNOSIS — Z9114 Patient's other noncompliance with medication regimen: Secondary | ICD-10-CM

## 2016-04-16 DIAGNOSIS — F329 Major depressive disorder, single episode, unspecified: Secondary | ICD-10-CM | POA: Diagnosis present

## 2016-04-16 DIAGNOSIS — J45909 Unspecified asthma, uncomplicated: Secondary | ICD-10-CM | POA: Diagnosis present

## 2016-04-16 DIAGNOSIS — Z91199 Patient's noncompliance with other medical treatment and regimen due to unspecified reason: Secondary | ICD-10-CM

## 2016-04-16 DIAGNOSIS — Z21 Asymptomatic human immunodeficiency virus [HIV] infection status: Secondary | ICD-10-CM | POA: Diagnosis present

## 2016-04-16 HISTORY — DX: Hypoglycemia, unspecified: E16.2

## 2016-04-16 LAB — CBC WITH DIFFERENTIAL/PLATELET
Basophils Absolute: 0 10*3/uL (ref 0.0–0.1)
Basophils Relative: 1 %
Eosinophils Absolute: 0.1 10*3/uL (ref 0.0–0.7)
Eosinophils Relative: 2 %
HEMATOCRIT: 37.6 % — AB (ref 39.0–52.0)
HEMOGLOBIN: 12.7 g/dL — AB (ref 13.0–17.0)
LYMPHS ABS: 3.2 10*3/uL (ref 0.7–4.0)
LYMPHS PCT: 47 %
MCH: 31.1 pg (ref 26.0–34.0)
MCHC: 33.8 g/dL (ref 30.0–36.0)
MCV: 92.2 fL (ref 78.0–100.0)
MONOS PCT: 8 %
Monocytes Absolute: 0.5 10*3/uL (ref 0.1–1.0)
NEUTROS ABS: 2.8 10*3/uL (ref 1.7–7.7)
NEUTROS PCT: 42 %
Platelets: 281 10*3/uL (ref 150–400)
RBC: 4.08 MIL/uL — AB (ref 4.22–5.81)
RDW: 13.2 % (ref 11.5–15.5)
WBC: 6.6 10*3/uL (ref 4.0–10.5)

## 2016-04-16 LAB — BASIC METABOLIC PANEL
ANION GAP: 8 (ref 5–15)
BUN: 12 mg/dL (ref 6–20)
CHLORIDE: 104 mmol/L (ref 101–111)
CO2: 24 mmol/L (ref 22–32)
CREATININE: 1.1 mg/dL (ref 0.61–1.24)
Calcium: 9 mg/dL (ref 8.9–10.3)
GFR calc non Af Amer: >60 mL/min (ref >60)
Glucose, Bld: 103 mg/dL — ABNORMAL HIGH (ref 65–99)
Potassium: 3.6 mmol/L (ref 3.5–5.1)
SODIUM: 136 mmol/L (ref 135–145)

## 2016-04-16 MED ORDER — GADOBENATE DIMEGLUMINE 529 MG/ML IV SOLN
15.0000 mL | Freq: Once | INTRAVENOUS | Status: AC
  Administered 2016-04-16: 15 mL via INTRAVENOUS

## 2016-04-16 MED ORDER — DOXYCYCLINE HYCLATE 100 MG IV SOLR
100.0000 mg | Freq: Once | INTRAVENOUS | Status: AC
  Administered 2016-04-16: 100 mg via INTRAVENOUS
  Filled 2016-04-16: qty 100

## 2016-04-16 MED ORDER — LORAZEPAM 2 MG/ML IJ SOLN
1.0000 mg | Freq: Once | INTRAMUSCULAR | Status: AC
  Administered 2016-04-16: 1 mg via INTRAVENOUS
  Filled 2016-04-16: qty 1

## 2016-04-16 MED ORDER — DEXTROSE 5 % IV SOLN
10.0000 mg/kg | Freq: Once | INTRAVENOUS | Status: AC
  Administered 2016-04-16: 675 mg via INTRAVENOUS
  Filled 2016-04-16: qty 13.5

## 2016-04-16 NOTE — ED Triage Notes (Signed)
Pt recently treated for migraines, sent here for MRI by eye doctor to rule out optic neuritis. Reports seeing "black" out of his right eye since 2/23.

## 2016-04-16 NOTE — ED Notes (Signed)
Dr. Tyrone Nine made aware of patient's arrival to ED at this time.

## 2016-04-16 NOTE — H&P (Signed)
History and Physical    Ryan Cruz DOB: August 25, 1965 DOA: 04/16/2016  PCP: Bobby Rumpf, MD Consultants:  None Patient coming from: home - lives with wife and son; NOK: Orpah Greek, father  Chief Complaint: right eye vision loss  HPI: Ryan Cruz is a 51 y.o. male with medical history significant of untreated (since 12/17) HIV infection; depression/anxiety; and HTN presenting with unilateral vision loss.  Patient woke up on 2/24 and he felt like someone had hit him in the head with a hammer, drove a stake through his head.  Came to ER, treated for migraine, normal ESR.  Pain unrelenting, came back on 2/28.  Diagnosed with shingles.  Went to eye doctor today and he immediately sent him to the ER for an MRI.  Vision problems started on the 24th, worse on the 28th, by yesterday he was unable to see out of his right eye.  Headache has eased up some but it never completely goes away.  Headache is in the right temple.  +fever to 101, before his visit on the 28th.  N/V, which is unusual for him.    HIV infection in 2008, bisexual sexual transmission.  Has not been taking medication since the end of December.  Medication ran out, he needed to renew his [employer] paperwork, had to see a doctor before he could get the paperwork completed and he could not afford the appointment.   ED Course: MRI suspicious for optic neuritis.  Neuro/ID/Ophthalmology all consulted.    Review of Systems: As per HPI; otherwise 10 point review of systems reviewed and negative.   Ambulatory Status:  Ambulates without assistance  Past Medical History:  Diagnosis Date  . Anxiety   . Asthma   . Depression   . HIV (human immunodeficiency virus infection) ([place])   . Hypertension   . Hypoglycemia    sugars drop ion    Past Surgical History:  Procedure Laterality Date  . COLONOSCOPY  02/12/2012   Procedure: COLONOSCOPY;  Surgeon: Leighton Ruff, MD;  Location: [facility];  Service: Endoscopy;  Laterality:  N/A;  . HERNIA REPAIR     umbilibcal    Social History   Social History  . Marital status: Married    Spouse name: N/A  . Number of children: N/A  . Years of education: N/A   Occupational History  . medication aide and hairstylist    Social History Main Topics  . Smoking status: Current Every Day Smoker    Packs/day: 0.20    Years: 15.00    Types: Cigarettes  . Smokeless tobacco: Never Used  . Alcohol use 1.2 oz/week    1 Glasses of wine, 1 Shots of liquor per week     Comment: occasional  . Drug use: Yes    Frequency: 7.0 times per week    Types: Marijuana     Comment: marijuana daily  . Sexual activity: Yes    Partners: Male     Comment: pt. declined condoms   Other Topics Concern  . Not on file   Social History Narrative  . No narrative on file    No Known Allergies  Family History  Problem Relation Age of Onset  . Diabetes Mother   . Hypertension Mother   . Heart disease Mother   . Congestive Heart Failure Mother   . Diabetes Father   . Hypertension Father   . Diabetes Sister   . HIV Sister   . Hypertension Sister   .  Diabetes Brother   . Hypertension Brother   . Diabetes Brother   . Hypertension Brother   . Diabetes Brother   . Hypertension Brother   . Diabetes Brother   . Hypertension Brother     Prior to Admission medications   Medication Sig Start Date End Date Taking? Authorizing Provider  acyclovir (ZOVIRAX) 800 MG tablet Take 1 tablet (800 mg total) by mouth 5 (five) times daily. 04/08/16  Yes Margarita Mail, PA-C  amLODipine (NORVASC) 5 MG tablet Take 1 tablet (5 mg total) by mouth daily. 11/20/15  Yes Campbell Riches, MD  emtricitabine-rilpivir-tenofovir AF (ODEFSEY) 200-25-25 MG TABS tablet Take 1 tablet by mouth daily with breakfast. 06/25/15  Yes Thayer Headings, MD  HYDROcodone-acetaminophen [facility]) 5-325 MG tablet Take 1-2 tablets by mouth every 4 (four) hours as needed. 04/08/16  Yes Margarita Mail, PA-C  lisinopril  (PRINIVIL,ZESTRIL) 40 MG tablet Take 1 tablet (40 mg total) by mouth daily. 11/20/15  Yes Campbell Riches, MD  ondansetron (ZOFRAN ODT) 4 MG disintegrating tablet Take 1 tablet (4 mg total) by mouth every 8 (eight) hours as needed for nausea or vomiting. 02/25/16  Yes Lysbeth Penner, FNP  pseudoephedrine-acetaminophen (TYLENOL SINUS) 30-500 MG TABS tablet Take 1 tablet by mouth every 4 (four) hours as needed (sinus congestion).   Yes Historical Provider, MD    Physical Exam: Vitals:   04/16/16 2200 04/16/16 2300 04/17/16 0000 04/17/16 0100  BP: 144/87 145/95 135/90 146/83  Pulse: 61 (!) 51 (!) 57 64  Resp: 18   18  Temp:      TempSrc:      SpO2: 99% 97% 97% 97%     General:  Appears calm and comfortable and is NAD Eyes:  PERRL, EOMI, normal lids, iris, very poor vision from the right eye ENT:  grossly normal hearing, lips & tongue, mmm Neck:  no LAD, masses or thyromegaly Cardiovascular:  RRR, no m/r/g. No LE edema.  Respiratory:  CTA bilaterally, no w/r/r. Normal respiratory effort. Abdomen:  soft, ntnd, NABS Skin:  no rash or induration seen on limited exam Musculoskeletal:  grossly normal tone BUE/BLE, good ROM, no bony abnormality Psychiatric:  irritable mood and affect, speech fluent and appropriate, AOx3 Neurologic:  CN 2-12 grossly intact, moves all extremities in coordinated fashion, sensation intact  Labs on Admission: I have personally reviewed following labs and imaging studies  CBC:  Recent Labs Lab 04/13/16 1532 04/16/16 1825  WBC 6.3 6.6  NEUTROABS  --  2.8  HGB 12.6* 12.7*  HCT 37.3* 37.6*  MCV 93.3 92.2  PLT 281 650   Basic Metabolic Panel:  Recent Labs Lab 04/13/16 1532 04/16/16 1825  NA 140 136  K 3.8 3.6  CL 106 104  CO2 27 24  GLUCOSE 86 103*  BUN 11 12  CREATININE 1.22 1.10  CALCIUM 9.3 9.0   GFR: Estimated Creatinine Clearance: 78.5 mL/min (by C-G formula based on SCr of 1.1 mg/dL). Liver Function Tests:  Recent Labs Lab  04/13/16 1532  AST 29  ALT 33  ALKPHOS 65  BILITOT 0.4  PROT 7.3  ALBUMIN 4.1   No results for input(s): LIPASE, AMYLASE in the last 168 hours. No results for input(s): AMMONIA in the last 168 hours. Coagulation Profile: No results for input(s): INR, PROTIME in the last 168 hours. Cardiac Enzymes: No results for input(s): CKTOTAL, CKMB, CKMBINDEX, TROPONINI in the last 168 hours. BNP (last 3 results) No results for input(s): PROBNP in the last 8760  hours. HbA1C: No results for input(s): HGBA1C in the last 72 hours. CBG: No results for input(s): GLUCAP in the last 168 hours. Lipid Profile: No results for input(s): CHOL, HDL, LDLCALC, TRIG, CHOLHDL, LDLDIRECT in the last 72 hours. Thyroid Function Tests: No results for input(s): TSH, T4TOTAL, FREET4, T3FREE, THYROIDAB in the last 72 hours. Anemia Panel: No results for input(s): VITAMINB12, FOLATE, FERRITIN, TIBC, IRON, RETICCTPCT in the last 72 hours. Urine analysis:    Component Value Date/Time   COLORURINE YELLOW 03/13/2011 0908   APPEARANCEUR CLEAR 03/13/2011 0908   LABSPEC 1.021 03/13/2011 0908   PHURINE 5.5 03/13/2011 0908   GLUCOSEU NEG 03/13/2011 0908   GLUCOSEU NEG mg/dL 09/03/2006 2056   HGBUR NEG 03/13/2011 0908   HGBUR moderate 09/03/2006 1422   BILIRUBINUR NEG 03/13/2011 0908   KETONESUR NEG 03/13/2011 0908   PROTEINUR NEG 03/13/2011 0908   UROBILINOGEN 0.2 03/13/2011 0908   NITRITE NEG 03/13/2011 0908   LEUKOCYTESUR NEG 03/13/2011 0908    Creatinine Clearance: Estimated Creatinine Clearance: 78.5 mL/min (by C-G formula based on SCr of 1.1 mg/dL).  Sepsis Labs: '@LABRCNTIP'$ (procalcitonin:4,lacticidven:4) )No results found for this or any previous visit (from the past 240 hour(s)).   Radiological Exams on Admission: Mr Jeri Cos ZO Contrast  Addendum Date: 04/16/2016   ADDENDUM REPORT: 04/16/2016 21:33 ADDENDUM: Acute findings discussed with and reconfirmed by Dr.Lindzen, Neurology on 04/16/2016 at 9:15 pm.  Electronically Signed   By: Elon Alas M.D.   On: 04/16/2016 21:33   Result Date: 04/16/2016 CLINICAL DATA:  RIGHT eye vision loss today. Eye shingles with reported optic nerve swelling, on acyclovir for a week. Migraines. History of hypertension and HIV. EXAM: MRI HEAD AND ORBITS WITHOUT AND WITH CONTRAST TECHNIQUE: Multiplanar, multiecho pulse sequences of the brain and surrounding structures were obtained without and with intravenous contrast. Multiplanar, multiecho pulse sequences of the orbits and surrounding structures were obtained including fat saturation techniques, before and after intravenous contrast administration. CONTRAST:  33m MULTIHANCE GADOBENATE DIMEGLUMINE 529 MG/ML IV SOLN COMPARISON:  CT HEAD February 28th 2018 and CT angiogram head and neck April 04, 2016 FINDINGS: MRI HEAD FINDINGS- Mild motion degraded examination. INTRACRANIAL CONTENTS: No reduced diffusion to suggest acute ischemia nor hyperacute demyelination. No susceptibility artifact to suggest hemorrhage. The ventricles and sulci are normal for patient's age. No suspicious parenchymal signal, masses, mass effect. No abnormal intraparenchymal or extra-axial enhancement. No abnormal extra-axial fluid collections. No extra-axial masses. VASCULAR: Normal major intracranial vascular flow voids present at skull base. SKULL AND UPPER CERVICAL SPINE: No abnormal sellar expansion. No suspicious calvarial bone marrow signal. Craniocervical junction maintained. OTHER: None. MRI ORBITS FINDINGS- moderately motion degraded examination. ORBITS: Reduced diffusion RIGHT optic nerve within the intra-ocular ocular and anterior intraorbital segments with reduced diffusion, expanded bright T2 signal. Enhancement RIGHT optic nerve from the intraorbital segment through the intracanalicular segment. Ocular globes are intact with normal signal. Lenses are located. Preservation of the orbital fat. Normal symmetric appearance of the extraocular  muscles. No intra-ocular mass, signal abnormality nor abnormal enhancement. Superior ophthalmic veins are not enlarged. VISUALIZED SINUSES: Trace paranasal sinus mucosal thickening without air-fluid levels. Mastoid air cells are well aerated. SOFT TISSUES: Normal. 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 However, superimposed reduced diffusion within the optic nerve concerning for ischemia/infarct (potentially secondary to local vasculopathy). MRI brain:  Normal. Acute findings discussed with and reconfirmed by PA WWaynetta Peanon 04/16/2016 at 8:23 pm. Electronically Signed: By:  Elon Alas M.D. On: 04/16/2016 20:25   Mr Rosealee Albee BO Contrast  Addendum Date: 04/16/2016   ADDENDUM REPORT: 04/16/2016 21:33 ADDENDUM: Acute findings discussed with and reconfirmed by Dr.Lindzen, Neurology on 04/16/2016 at 9:15 pm. Electronically Signed   By: Elon Alas M.D.   On: 04/16/2016 21:33   Result Date: 04/16/2016 CLINICAL DATA:  RIGHT eye vision loss today. Eye shingles with reported optic nerve swelling, on acyclovir for a week. Migraines. History of hypertension and HIV. EXAM: MRI HEAD AND ORBITS WITHOUT AND WITH CONTRAST TECHNIQUE: Multiplanar, multiecho pulse sequences of the brain and surrounding structures were obtained without and with intravenous contrast. Multiplanar, multiecho pulse sequences of the orbits and surrounding structures were obtained including fat saturation techniques, before and after intravenous contrast administration. CONTRAST:  33m MULTIHANCE GADOBENATE DIMEGLUMINE 529 MG/ML IV SOLN COMPARISON:  CT HEAD February 28th 2018 and CT angiogram head and neck April 04, 2016 FINDINGS: MRI HEAD FINDINGS- Mild motion degraded examination. INTRACRANIAL CONTENTS: No reduced diffusion to suggest acute ischemia nor hyperacute demyelination. No susceptibility artifact to suggest hemorrhage. The ventricles and sulci  are normal for patient's age. No suspicious parenchymal signal, masses, mass effect. No abnormal intraparenchymal or extra-axial enhancement. No abnormal extra-axial fluid collections. No extra-axial masses. VASCULAR: Normal major intracranial vascular flow voids present at skull base. SKULL AND UPPER CERVICAL SPINE: No abnormal sellar expansion. No suspicious calvarial bone marrow signal. Craniocervical junction maintained. OTHER: None. MRI ORBITS FINDINGS- moderately motion degraded examination. ORBITS: Reduced diffusion RIGHT optic nerve within the intra-ocular ocular and anterior intraorbital segments with reduced diffusion, expanded bright T2 signal. Enhancement RIGHT optic nerve from the intraorbital segment through the intracanalicular segment. Ocular globes are intact with normal signal. Lenses are located. Preservation of the orbital fat. Normal symmetric appearance of the extraocular muscles. No intra-ocular mass, signal abnormality nor abnormal enhancement. Superior ophthalmic veins are not enlarged. VISUALIZED SINUSES: Trace paranasal sinus mucosal thickening without air-fluid levels. Mastoid air cells are well aerated. SOFT TISSUES: Normal. 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 However, superimposed reduced diffusion within the optic nerve concerning for ischemia/infarct (potentially secondary to local vasculopathy). MRI brain:  Normal. Acute findings discussed with and reconfirmed by PA WWaynetta Peanon 04/16/2016 at 8:23 pm. Electronically Signed: By: CElon AlasM.D. On: 04/16/2016 20:25    EKG: not done  Assessment/Plan Principal Problem:   Optic neuritis Active Problems:   [place]   Optic neuritis -Concern for viral given recent diagnosis of shingles; will treat with IV acyclovir. -Concern for other infections given patient's lack of HIV therapy at this  time.  Will treat with IV doxycycline. -He will need an LP in the AM, to be performed by neurology.  It has to be done at a time when the CSF can be quickly sent for cytology and flow cytometry. -For now, will not give steroids due to the risk of exacerbating bacterial infection.  However, if thought to not be due to infection then pulsed dose steroid therapy is recommended. -Neurology has seen the patient and is writing a comprehensive consult note. -ID and ophthalmology to see patient in AM.   HIV -HIV viral load, 69,300 on 3/5 -CD4 count 620 on 3/5 -He stopped taking his medications in December due to financial and logistical issues. -These have been restarted.   -He may need assistance in arranging medications as an outpatient. -Of note, his father is his  only family member who knows about his HIV status.  He is married but does not have sexual relations with his wife and she is unaware.  He requests that his conditions not be discussed in front of family members.   DVT prophylaxis:  SCDs until LP and other procedures are complete Code Status:  DNR - confirmed with patient Family Communication: Father present prior to and after the encounter Disposition Plan:  Home once clinically improved Consults called: ID, Neurology, Ophthalmology  Admission status: Admit - It is my clinical opinion that admission to INPATIENT is reasonable and necessary because this patient will require at least 2 midnights in the hospital to treat this condition based on the medical complexity of the problems presented.  Given the aforementioned information, the predictability of an adverse outcome is felt to be significant.    Karmen Bongo MD [facility]  If 7PM-7AM, please contact night-coverage [email or web address] Password [email or web address]  04/17/2016, 1:56 AM

## 2016-04-16 NOTE — ED Notes (Signed)
Patient transported to Ultrasound 

## 2016-04-16 NOTE — ED Notes (Addendum)
Pt states that he was referred here by his eye doctor on the 24th for mirgraines. Pt states he went back to the eye doctor for eye shingles but was sent here because his optic nerve was swollen. Pt states he has had 2 CT scans with no findings.

## 2016-04-16 NOTE — ED Provider Notes (Signed)
[facility] DEPT Provider Note   CSN: [id number] Arrival date & time: 04/16/16  1504     History   Chief Complaint No chief complaint on file.   HPI Quest E Tetrault is a 51 y.o. male.  Jaclyn E Tinnell is a 51 y.o. Male with a history of HIV with a CD4 count of 620 and viral load of 69,000 as of 3 days ago who presents the emergency department after being referred by his optometrist today for concern for optic neuritis. Patient reports for about 2 weeks he's had right-sided headache and eye pain that has not improved. He was seen by his optometrist today he is concerned he may have optic neuritis was sent to the ER for MRI. Patient is been seen in the emergency department twice previously and had a CT angiogram of his head and neck which showed no significant abnormalities and a CT of his orbits which showed no significant abnormalities. He was started on acyclovir for concern for possible unerrupted shingles at the most recent ER visit.  Patient tells me over the past 2 weeks patient has gradually worsened loss of vision in his right eye. He also reports pain with movement of his right eye. He reports right-sided headache. He's been on acyclovir for possible shingles. Later, he reveals that he has not been compliant with HEART therapy since 12/17.  He tells me he has had some nausea and vomiting. Patient was seen by optometrist Dr. Helane Gunther. Patient denies fevers, abdominal pain, numbness, tingling, weakness, neck pain, back pain, chest pain, coughing, shortness of breath or rashes.   The history is provided by the patient and medical records. No language interpreter was used.    Past Medical History:  Diagnosis Date  . Anxiety   . Asthma   . Depression   . HIV (human immunodeficiency virus infection) ([place])   . Hypertension   . Hypoglycemia    sugars drop ion    Patient Active Problem List   Diagnosis Date Noted  . Optic neuritis 04/16/2016  . Chest pain 11/20/2015  . Cerumen  impaction 01/29/2014  . Hypoglycemia 01/29/2014  . Allergic sinusitis 06/29/2012  . Pilonidal cyst 01/04/2012  . Depression 06/25/2010  . 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 Essential hypertension 12/12/2009  . Ventral hernia 03/28/2009  . NEUROPATHY, NOS 04/30/2008  . DENTAL CARIES 12/14/2007  . TOBACCO USER 06/01/2007  . VERTIGO 05/13/2007  . ACUTE BRONCHITIS 12/21/2006  . LYMPHADENOPATHY 07/06/2006  . Human immunodeficiency virus (HIV) disease ([place]) 02/24/2006  . HYPERLIPIDEMIA 02/24/2006  . Rectal bleeding 11/19/2005  . ASTHMA 12/11/1994    Past Surgical History:  Procedure Laterality Date  . COLONOSCOPY  02/12/2012   Procedure: COLONOSCOPY;  Surgeon: Leighton Ruff, MD;  Location: [facility] ENDOSCOPY;  Service: Endoscopy;  Laterality: N/A;  . HERNIA REPAIR     umbilibcal       Home Medications    Prior to Admission medications   Medication Sig Start Date End Date Taking? Authorizing Provider  acyclovir (ZOVIRAX) 800 MG tablet Take 1 tablet (800 mg total) by mouth 5 (five) times daily. 04/08/16  Yes Margarita Mail, PA-C  amLODipine (NORVASC) 5 MG tablet Take 1 tablet (5 mg total) by mouth daily. 11/20/15  Yes Campbell Riches, MD  emtricitabine-rilpivir-tenofovir AF (ODEFSEY) 200-25-25 MG TABS tablet Take 1 tablet by mouth daily with breakfast. 06/25/15  Yes Thayer Headings, MD  HYDROcodone-acetaminophen [facility]) 5-325 MG tablet Take 1-2 tablets by mouth every 4 (four) hours as needed. 04/08/16  Yes Margarita Mail, PA-C  lisinopril (PRINIVIL,ZESTRIL) 40 MG tablet Take 1 tablet (40 mg total) by mouth daily. 11/20/15  Yes Campbell Riches, MD  ondansetron (ZOFRAN ODT) 4 MG disintegrating tablet Take 1 tablet (4 mg total) by mouth every 8 (eight) hours as needed for nausea or vomiting. 02/25/16  Yes Lysbeth Penner, FNP  pseudoephedrine-acetaminophen (TYLENOL SINUS) 30-500 MG TABS tablet Take 1 tablet by mouth every 4 (four) hours as needed (sinus congestion).   Yes Historical Provider,  MD    Family History Family History  Problem Relation Age of Onset  . Diabetes Mother   . Hypertension Mother   . Heart disease Mother   . Congestive Heart Failure Mother   . Diabetes Father   . Hypertension Father   . Diabetes Sister   . HIV Sister   . Hypertension Sister   . Diabetes Brother   . Hypertension Brother   . Diabetes Brother   . Hypertension Brother   . Diabetes Brother   . Hypertension Brother   . Diabetes Brother   . Hypertension Brother     Social History Social History  Substance Use Topics  . Smoking status: Current Every Day Smoker    Packs/day: 0.20    Years: 15.00    Types: Cigarettes  . Smokeless tobacco: Never Used  . Alcohol use 1.2 oz/week    1 Glasses of wine, 1 Shots of liquor per week     Comment: occasional     Allergies   Patient has no known allergies.   Review of Systems Review of Systems  Constitutional: Negative for chills and fever.  HENT: Negative for congestion, rhinorrhea and sore throat.   Eyes: Positive for pain and visual disturbance. Negative for discharge, redness and itching.  Respiratory: Negative for cough and shortness of breath.   Cardiovascular: Negative for chest pain.  Gastrointestinal: Negative for abdominal pain, diarrhea, nausea and vomiting.  Genitourinary: Negative for dysuria.  Musculoskeletal: Negative for back pain, neck pain and neck stiffness.  Skin: Negative for rash.  Neurological: Positive for headaches. Negative for dizziness, syncope, weakness, light-headedness and numbness.     Physical Exam Updated Vital Signs BP 146/83   Pulse 64   Temp 98.7 F (37.1 C) (Oral)   Resp 18   SpO2 97%   Physical Exam  Constitutional: He is oriented to person, place, and time. He appears well-developed and well-nourished. No distress.  Nontoxic appearing.  HENT:  Head: Normocephalic and atraumatic.  Right Ear: External ear normal.  Left Ear: External ear normal.  Mouth/Throat: Oropharynx is clear  and moist.  Eyes: Conjunctivae and EOM are normal. Pupils are equal, round, and reactive to light. Right eye exhibits no discharge. Left eye exhibits no discharge.  EOMs are intact with pain to right eye with EOMs. No conjunctival injection bilaterally. Patient is unable to see out of his right eye even large objects.  No periorbital edema or erythema. No signs of shingles to his face or eye.  Neck: Normal range of motion. Neck supple. No JVD present. No tracheal deviation present.  No meningeal signs.  Cardiovascular: Normal rate, regular rhythm, normal heart sounds and intact distal pulses.  Exam reveals no gallop and no friction rub.   No murmur heard. Pulmonary/Chest: Effort normal and breath sounds normal. No stridor. No respiratory distress. He has no wheezes. He has no rales.  Abdominal: Soft. There is no tenderness.  Musculoskeletal: He exhibits no edema or tenderness.  Lymphadenopathy:  He has no cervical adenopathy.  Neurological: He is alert and oriented to person, place, and time. A cranial nerve deficit is present. No sensory deficit. He exhibits normal muscle tone. Coordination normal.  Strength and sensation is intact his bilateral upper and lower extremities. Patient's cranial nerves are intact except for his vision to his right eye.  Skin: Skin is warm and dry. Capillary refill takes less than 2 seconds. No rash noted. He is not diaphoretic. No erythema. No pallor.  Psychiatric: He has a normal mood and affect. His behavior is normal.  Nursing note and vitals reviewed.    ED Treatments / Results  Labs (all labs ordered are listed, but only abnormal results are displayed) Labs Reviewed  BASIC METABOLIC PANEL - Abnormal; Notable for the following:       Result Value   Glucose, Bld 103 (*)    All other components within normal limits  CBC WITH DIFFERENTIAL/PLATELET - Abnormal; Notable for the following:    RBC 4.08 (*)    Hemoglobin 12.7 (*)    HCT 37.6 (*)    All  other components within normal limits  CULTURE, BLOOD (ROUTINE X 2)  CULTURE, BLOOD (ROUTINE X 2)  RPR  CMV DNA, QUANTITATIVE, PCR  ANTINUCLEAR ANTIBODIES, IFA  ANGIOTENSIN CONVERTING ENZYME  BARTONELLA ANITBODY PANEL  BASIC METABOLIC PANEL  CBC    EKG  EKG Interpretation None       Radiology Mr Jeri Cos Wo Contrast  Addendum Date: 04/16/2016   ADDENDUM REPORT: 04/16/2016 21:33 ADDENDUM: Acute findings discussed with and reconfirmed by Dr.Lindzen, Neurology on 04/16/2016 at 9:15 pm. Electronically Signed   By: Elon Alas M.D.   On: 04/16/2016 21:33   Result Date: 04/16/2016 CLINICAL DATA:  RIGHT eye vision loss today. Eye shingles with reported optic nerve swelling, on acyclovir for a week. Migraines. History of hypertension and HIV. EXAM: MRI HEAD AND ORBITS WITHOUT AND WITH CONTRAST TECHNIQUE: Multiplanar, multiecho pulse sequences of the brain and surrounding structures were obtained without and with intravenous contrast. Multiplanar, multiecho pulse sequences of the orbits and surrounding structures were obtained including fat saturation techniques, before and after intravenous contrast administration. CONTRAST:  10mL MULTIHANCE GADOBENATE DIMEGLUMINE 529 MG/ML IV SOLN COMPARISON:  CT HEAD February 28th 2018 and CT angiogram head and neck April 04, 2016 FINDINGS: MRI HEAD FINDINGS- Mild motion degraded examination. INTRACRANIAL CONTENTS: No reduced diffusion to suggest acute ischemia nor hyperacute demyelination. No susceptibility artifact to suggest hemorrhage. The ventricles and sulci are normal for patient's age. No suspicious parenchymal signal, masses, mass effect. No abnormal intraparenchymal or extra-axial enhancement. No abnormal extra-axial fluid collections. No extra-axial masses. VASCULAR: Normal major intracranial vascular flow voids present at skull base. SKULL AND UPPER CERVICAL SPINE: No abnormal sellar expansion. No suspicious calvarial bone marrow signal.  Craniocervical junction maintained. OTHER: None. MRI ORBITS FINDINGS- moderately motion degraded examination. ORBITS: Reduced diffusion RIGHT optic nerve within the intra-ocular ocular and anterior intraorbital segments with reduced diffusion, expanded bright T2 signal. Enhancement RIGHT optic nerve from the intraorbital segment through the intracanalicular segment. Ocular globes are intact with normal signal. Lenses are located. Preservation of the orbital fat. Normal symmetric appearance of the extraocular muscles. No intra-ocular mass, signal abnormality nor abnormal enhancement. Superior ophthalmic veins are not enlarged. VISUALIZED SINUSES: Trace paranasal sinus mucosal thickening without air-fluid levels. Mastoid air cells are well aerated. SOFT TISSUES: Normal. 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 However, superimposed  reduced diffusion within the optic nerve concerning for ischemia/infarct (potentially secondary to local vasculopathy). MRI brain:  Normal. Acute findings discussed with and reconfirmed by PA Waynetta Pean on 04/16/2016 at 8:23 pm. Electronically Signed: By: Elon Alas M.D. On: 04/16/2016 20:25   Mr Rosealee Albee JQ Contrast  Addendum Date: 04/16/2016   ADDENDUM REPORT: 04/16/2016 21:33 ADDENDUM: Acute findings discussed with and reconfirmed by Dr.Lindzen, Neurology on 04/16/2016 at 9:15 pm. Electronically Signed   By: Elon Alas M.D.   On: 04/16/2016 21:33   Result Date: 04/16/2016 CLINICAL DATA:  RIGHT eye vision loss today. Eye shingles with reported optic nerve swelling, on acyclovir for a week. Migraines. History of hypertension and HIV. EXAM: MRI HEAD AND ORBITS WITHOUT AND WITH CONTRAST TECHNIQUE: Multiplanar, multiecho pulse sequences of the brain and surrounding structures were obtained without and with intravenous contrast. Multiplanar, multiecho pulse sequences of the orbits and surrounding  structures were obtained including fat saturation techniques, before and after intravenous contrast administration. CONTRAST:  51mL MULTIHANCE GADOBENATE DIMEGLUMINE 529 MG/ML IV SOLN COMPARISON:  CT HEAD February 28th 2018 and CT angiogram head and neck April 04, 2016 FINDINGS: MRI HEAD FINDINGS- Mild motion degraded examination. INTRACRANIAL CONTENTS: No reduced diffusion to suggest acute ischemia nor hyperacute demyelination. No susceptibility artifact to suggest hemorrhage. The ventricles and sulci are normal for patient's age. No suspicious parenchymal signal, masses, mass effect. No abnormal intraparenchymal or extra-axial enhancement. No abnormal extra-axial fluid collections. No extra-axial masses. VASCULAR: Normal major intracranial vascular flow voids present at skull base. SKULL AND UPPER CERVICAL SPINE: No abnormal sellar expansion. No suspicious calvarial bone marrow signal. Craniocervical junction maintained. OTHER: None. MRI ORBITS FINDINGS- moderately motion degraded examination. ORBITS: Reduced diffusion RIGHT optic nerve within the intra-ocular ocular and anterior intraorbital segments with reduced diffusion, expanded bright T2 signal. Enhancement RIGHT optic nerve from the intraorbital segment through the intracanalicular segment. Ocular globes are intact with normal signal. Lenses are located. Preservation of the orbital fat. Normal symmetric appearance of the extraocular muscles. No intra-ocular mass, signal abnormality nor abnormal enhancement. Superior ophthalmic veins are not enlarged. VISUALIZED SINUSES: Trace paranasal sinus mucosal thickening without air-fluid levels. Mastoid air cells are well aerated. SOFT TISSUES: Normal. 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 However, superimposed reduced diffusion within the optic nerve concerning for ischemia/infarct (potentially secondary to local vasculopathy).  MRI brain:  Normal. Acute findings discussed with and reconfirmed by PA Waynetta Pean on 04/16/2016 at 8:23 pm. Electronically Signed: By: Elon Alas M.D. On: 04/16/2016 20:25    Procedures Procedures (including critical care time)  CRITICAL CARE Performed by: Hanley Hays   Total critical care time: 60 minutes  Critical care time was exclusive of separately billable procedures and treating other patients.  Critical care was necessary to treat or prevent imminent or life-threatening deterioration.  Critical care was time spent personally by me on the following activities: development of treatment plan with patient and/or surrogate as well as nursing, discussions with consultants, evaluation of patient's response to treatment, examination of patient, obtaining history from patient or surrogate, ordering and performing treatments and interventions, ordering and review of laboratory studies, ordering and review of radiographic studies, pulse oximetry and re-evaluation of patient's condition.   Medications Ordered in ED Medications  HYDROcodone-acetaminophen (NORCO/VICODIN) 5-325 MG per tablet 1-2 tablet (not administered)  amLODipine (NORVASC) tablet 5 mg (not administered)  lisinopril (PRINIVIL,ZESTRIL) tablet 40 mg (not administered)  emtricitabine-rilpivir-tenofovir AF (ODEFSEY) 200-25-25  MG per tablet 1 tablet (not administered)  acetaminophen (TYLENOL) tablet 650 mg (not administered)    Or  acetaminophen (TYLENOL) suppository 650 mg (not administered)  ondansetron (ZOFRAN) tablet 4 mg (not administered)    Or  ondansetron (ZOFRAN) injection 4 mg (not administered)  doxycycline (VIBRAMYCIN) 100 mg in dextrose 5 % 250 mL IVPB (not administered)  acyclovir (ZOVIRAX) 700 mg in dextrose 5 % 100 mL IVPB (not administered)  LORazepam (ATIVAN) injection 1 mg (1 mg Intravenous Given 04/16/16 1827)  gadobenate dimeglumine (MULTIHANCE) injection 15 mL (15 mLs Intravenous  Contrast Given 04/16/16 1938)  acyclovir (ZOVIRAX) 675 mg in dextrose 5 % 100 mL IVPB (0 mg/kg  67.6 kg Intravenous Stopped 04/17/16 0144)  doxycycline (VIBRAMYCIN) 100 mg in dextrose 5 % 250 mL IVPB (0 mg Intravenous Stopped 04/17/16 0144)     Initial Impression / Assessment and Plan / ED Course  I have reviewed the triage vital signs and the nursing notes.  Pertinent labs & imaging results that were available during my care of the patient were reviewed by me and considered in my medical decision making (see chart for details).  Clinical Course as of Apr 17 208  Thu Apr 16, 2016  2025 I consulted with neurologist Dr. Cheral Marker again and informed about imaging results. He would like me to consult with ophthalmology and would like them to see the patient. He will also come by and see the patient.   [WD]  2030 I consulted with ophthalmologist Dr. Manuella Ghazi who reports that he would not be able to tell based on exam if the patient has infectious optic neuritis or otherwise. MRI was appropriate. He would like me to consult with infectious disease.   [WD]  2045 I consulted with ID Dr. Johnnye Sima who is the patient's physician. He is reassured that the patient has a CD4 count of 620 3 days ago. He would not recheck CD4 count today. He doubts infectious cause, but reports this is not out of the realm of possibility. He would liek ANA, ACE, RPR, bortatella and consult with neuro. He would start the patient on IV acyclovir and doxycycline.   [WD]  2142 I consulted again with ID Dr. Johnnye Sima and ophthalmologist Dr. Manuella Ghazi who both report they will see the patient in consult tomorrow.   [WD]    Clinical Course User Index [WD] Waynetta Pean, PA-C   This is a 51 y.o. Male with a history of HIV with a CD4 count of 620 and viral load of 69,000 as of 3 days ago who presents the emergency department after being referred by his optometrist today for concern for optic neuritis. Patient reports for about 2 weeks he's had  right-sided headache and eye pain that has not improved. He was seen by his optometrist today he is concerned he may have optic neuritis was sent to the ER for MRI. Patient is been seen in the emergency department twice previously and had a CT angiogram of his head and neck which showed no significant abnormalities and a CT of his orbits which showed no significant abnormalities. He was started on acyclovir for concern for possible unerrupted shingles at the most recent ER visit. Later, he reveals that he has not been compliant with HEART therapy since 12/17.   On exam the patient is afebrile nontoxic appearing. Visual exam he tells me he can only see some light and large objects through his right eye. He is unable to cooperate further with visual acuity. He otherwise  has no focal neurological deficits. He has no surrounding orbital erythema or edema. No evidence of shingles externally. No other focal neurological deficits identified.  On review of patient's medical record from optometry visit today patient had normal intraocular pressures bilaterally. He is concerned about optic neuritis due to optic nerve edema and requests MRI.   Stat MR brain and orbits with and without contrast were ordered. I consulted with Dr. Cheral Marker from neurology who will follow the patient and I will call him back with MRI results.   Radiology called with acute findings on MR of his orbits which showed severe right optic neuritis that is likely infectious in origin. There is also concern for ischemia or infarct in the right optic nerve. I ordered emergent consult to neurology and ophthalmology. Please see notes above about these consults. Ophthalmologist reports that the infarct is optic nerve is more of a wait and see approach. There is nothing we can do at this time to mitigate loss of vision from an infarct. Will of course treat for infectious cause of optic neuritis and look for other causes of this.   Plan in the ED as to  draw blood cultures, ANA, RPR, Ace, Bartonella antibody and CMV by PCR. Will start IV acyclovir and doxycycline per infectious disease. Will admit to medicine. Plan is for neurology to perform LP in the morning. Patient agrees with plan for admission.  I consulted with hospitalist Dr. Lorin Mercy who accepted the patient for admission.   This patient was discussed with and evaluated by Dr. Ellender Hose who agrees with assessment and plan.   Final Clinical Impressions(s) / ED Diagnoses   Final diagnoses:  Blurry vision  Optic neuritis  HIV (human immunodeficiency virus infection) ([place])  Medically noncompliant  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    New Prescriptions Current Discharge Medication List       Waynetta Pean, PA-C 04/17/16 7416    Duffy Bruce, MD 04/17/16 1106

## 2016-04-16 NOTE — ED Notes (Addendum)
Dr Tyrone Nine contacted this RN prior to patient's arrival stating he had spoken with pts eye doctor, MD concerned for optic neuritis, Dr. Tyrone Nine gave this RN verbal order for MRI w/o contrast

## 2016-04-16 NOTE — ED Notes (Signed)
ED Provider at bedside. 

## 2016-04-16 NOTE — Consult Note (Addendum)
NEURO HOSPITALIST CONSULT NOTE   Requestig physician: Dr. Lorin Mercy  Reason for Consult: Right monocular vision loss with eye pain  History obtained from:  Patient and Chart     HPI:                                                                                                                                          Ryan Cruz is an 51 y.o. male who presented to the ED on 3/8 on recommendation of his optometrist to rule out optic neuritis. The patient states he went back to the eye doctor for "eye shingles" and then was sent here because his optic nerve was swollen.   The symptoms began on 2/23 per notes in chart, with RNs note from 2/24 documenting the following: "Per pt, states he feels like a nail is sticking in the right side of head-states started last night, vomited once this am-sensitive to movement". He was seen in the [facility] ED on 2/24 with stabbing right sided headache. He was referred here by his eye doctor on that day for "migraines". At that time he denied any blurry vision. However, a note from today documents that the patient states that he has been "seeing 'black' out of his right eye since 2/23".The headache was associated with one episode of vomiting; it was rated as more severe than headaches that he had had in the past. Mild temporal tenderness was noted at that time. CT head was negative. A CT angiogram of the head and neck revealed no aneurysm or significant stenosis. He was diagnosed with migraine headache; temporal arteritis was felt to be unlikely as he had a normal ESR at that time. Subsequent CT of the orbits on 2/28 was negative.   The patient has HIV. The patient has HIV. In October of 2017, his CD4 was 500 and HIV on quantitative PCR was undetectable. Today, the patient states that he has been off his HAART therapy since mid-December due to "certification" issues related to his prescriptions.   MRI of orbits with contrast reveals severe swelling of the  right optic nerve, with thickening and avid enhancement of the optic nerve sheath. 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 However, on my examination there is no cutaneous evidence for shin, as well as no scleral injection, corneal opacity or proptosis. Radiology also noted superimposed reduced diffusion within the optic nerve, a finding which I agree with and which is concerning for Ischemia/infarct, potentially secondary to local vasculopathy. On my review (Neurology) of the images, the enhancement appears to involve the optic nerve sheath suggests extrinsic compression of the optic nerve by inflamed meninges as a possible etiology for the  vision loss and nerve infarction. There is possible enhancement of the right optic nerve itself, but the meningeal inflammatory versus infiltrative findings involving the optic nerve sheath predominate. The optic nerve sheath findings are more consistent with infection or malignancy (CNS lymphoma) as the primary etiology for the vision loss rather than optic neuritis (would not generally lead to perioptic inflammation, although the reverse can occur).   Past Medical History:  Diagnosis Date  . Anxiety   . Asthma   . Depression   . HIV (human immunodeficiency virus infection) ([place])   . Hypertension   . Hypoglycemia    sugars drop ion    Past Surgical History:  Procedure Laterality Date  . COLONOSCOPY  02/12/2012   Procedure: COLONOSCOPY;  Surgeon: Leighton Ruff, MD;  Location: [facility] ENDOSCOPY;  Service: Endoscopy;  Laterality: N/A;  . HERNIA REPAIR     umbilibcal    Family History  Problem Relation Age of Onset  . Diabetes Mother   . Hypertension Mother   . Heart disease Mother   . Congestive Heart Failure Mother   . Diabetes Father   . Hypertension Father   . Diabetes Sister   . HIV Sister   . Hypertension Sister   . Diabetes  Brother   . Hypertension Brother   . Diabetes Brother   . Hypertension Brother   . Diabetes Brother   . Hypertension Brother   . Diabetes Brother   . Hypertension Brother     Social History:  reports that he has been smoking Cigarettes.  He has a 3.00 pack-year smoking history. He has never used smokeless tobacco. He reports that he drinks about 1.2 oz of alcohol per week . He reports that he uses drugs, including Marijuana, about 7 times per week.  No Known Allergies  HOME MEDICATIONS:                                                                                                                     acyclovir (ZOVIRAX) 800 MG tablet Take 1 tablet (800 mg total) by mouth 5 (five) times daily. Karmen Bongo, MD Not Ordered  amLODipine (NORVASC) 5 MG tablet Take 1 tablet (5 mg total) by mouth daily. Karmen Bongo, MD Reordered  Orderedas:amLODipine [facility]) tablet 5 mg - 5 mg, Oral, Daily, First dose on Fri 04/17/16 at 1000  emtricitabine-rilpivir-tenofovir AF (ODEFSEY) 200-25-25 MG TABS tablet Take 1 tablet by mouth daily with breakfast. Karmen Bongo, MD Reordered  Orderedas:emtricitabine-rilpivir-tenofovir AF (ODEFSEY) 200-25-25 MG per tablet 1 tablet - Contraindicated with proton pump inhibitors. 1 tablet, Oral, Daily with breakfast, First dose on Fri 04/17/16 at 0800  HYDROcodone-acetaminophen (NORCO) 5-325 MG tablet Take 1-2 tablets by mouth every 4 (four) hours as needed. Karmen Bongo, MD Reordered  Orderedas:HYDROcodone-acetaminophen (NORCO/VICODIN) 5-325 MG per tablet 1-2 tablet - 1-2 tablet, Oral, Every 4 hours PRN, severe pain, Starting Fri 04/17/16 at 0207  lisinopril (PRINIVIL,ZESTRIL) 40 MG tablet Take 1 tablet (40 mg total) by mouth daily. Karmen Bongo, MD Reordered  Orderedas:lisinopril (PRINIVIL,ZESTRIL) tablet 40 mg - 40 mg, Oral, Daily, First dose on Fri 04/17/16 at 1000  ondansetron (ZOFRAN ODT) 4 MG disintegrating tablet Take 1 tablet (4 mg total) by mouth every 8  (eight) hours as needed for nausea or vomiting. Jonah Blue, MD Not Ordered  pseudoephedrine-acetaminophen (TYLENOL SINUS) 30-500 MG TABS tablet Take 1 tablet by mouth every 4 (four) hours as needed (sinus congestion). Jonah Blue, MD Not Ordered    ROS:                                                                                                                                       History obtained from patient. No chest pain, SOB or abdominal pain. No fevers, chills, rash or other symptoms of infection. Denies limb weakness, confusion, dysphasia or sensory numbness. Other ROS as per HPI.   Blood pressure 144/87, pulse 61, temperature 98.7 F (37.1 C), temperature source Oral, resp. rate 18, SpO2 99 %.  General Examination:                                                                                                      HEENT-  Normocephalic/atraumatic. Lungs- Respirations unlabored Extremities- No edema or rash.  Neurological Examination Mental Status: Alert, oriented, thought content appropriate.  Speech fluent without evidence of aphasia.  Able to follow all commands without difficulty. Cranial Nerves: II: Right optic disc inflamed with patches of pallor noted. Left optic disc normal. Complete loss of vision in right eye. Normal vision and visual fields in left eye. Right pupil 5 mm and unreactive to light, left pupil with intact response to light, with intact consensual response on right.  III,IV, VI: ptosis not present, EOMI bilaterally; severe right eye pain is induced by rightward gaze.  V,VII: smile symmetric, facial temperature sensation normal bilaterally VIII: hearing intact to conversation IX,X: No hypophonia XI: bilateral shoulder shrug symmetric XII: midline tongue extension Motor: Right : Upper extremity   5/5    Left:     Upper extremity   5/5  Lower extremity   5/5     Lower extremity   5/5 Normal tone throughout; no atrophy noted Sensory: Temperature and  light touch intact x 4, without extinction Deep Tendon Reflexes: 2+ and symmetric in upper and lower extremities Plantars: Right: downgoing   Left: downgoing Cerebellar: No ataxia with FNF bilaterally.  Gait: Deferred  Lab Results: Basic Metabolic Panel:  Recent Labs Lab 04/13/16 1532 04/16/16 1825  NA 140 136  K 3.8 3.6  CL 106 104  CO2 27 24  GLUCOSE 86 103*  BUN 11 12  CREATININE 1.22 1.10  CALCIUM 9.3 9.0    Liver Function Tests:  Recent Labs Lab 04/13/16 1532  AST 29  ALT 33  ALKPHOS 65  BILITOT 0.4  PROT 7.3  ALBUMIN 4.1   No results for input(s): LIPASE, AMYLASE in the last 168 hours. No results for input(s): AMMONIA in the last 168 hours.  CBC:  Recent Labs Lab 04/13/16 1532 04/16/16 1825  WBC 6.3 6.6  NEUTROABS  --  2.8  HGB 12.6* 12.7*  HCT 37.3* 37.6*  MCV 93.3 92.2  PLT 281 281    Cardiac Enzymes: No results for input(s): CKTOTAL, CKMB, CKMBINDEX, TROPONINI in the last 168 hours.  Lipid Panel: No results for input(s): CHOL, TRIG, HDL, CHOLHDL, VLDL, LDLCALC in the last 168 hours.  CBG: No results for input(s): GLUCAP in the last 168 hours.  Microbiology: Results for orders placed or performed in visit on 06/11/11  Grp A Strep     Status: None   Collection Time: 06/11/11 12:12 PM  Result Value Ref Range Status   GASP NEGATIVE  Final    Coagulation Studies: No results for input(s): LABPROT, INR in the last 72 hours.  Imaging: Mr Jeri Cos UR Contrast  Addendum Date: 04/16/2016   ADDENDUM REPORT: 04/16/2016 21:33 ADDENDUM: Acute findings discussed with and reconfirmed by Dr.Othman Masur, Neurology on 04/16/2016 at 9:15 pm. Electronically Signed   By: Elon Alas M.D.   On: 04/16/2016 21:33   Result Date: 04/16/2016 CLINICAL DATA:  RIGHT eye vision loss today. Eye shingles with reported optic nerve swelling, on acyclovir for a week. Migraines. History of hypertension and HIV. EXAM: MRI HEAD AND ORBITS WITHOUT AND WITH CONTRAST TECHNIQUE:  Multiplanar, multiecho pulse sequences of the brain and surrounding structures were obtained without and with intravenous contrast. Multiplanar, multiecho pulse sequences of the orbits and surrounding structures were obtained including fat saturation techniques, before and after intravenous contrast administration. CONTRAST:  69m MULTIHANCE GADOBENATE DIMEGLUMINE 529 MG/ML IV SOLN COMPARISON:  CT HEAD February 28th 2018 and CT angiogram head and neck April 04, 2016 FINDINGS: MRI HEAD FINDINGS- Mild motion degraded examination. INTRACRANIAL CONTENTS: No reduced diffusion to suggest acute ischemia nor hyperacute demyelination. No susceptibility artifact to suggest hemorrhage. The ventricles and sulci are normal for patient's age. No suspicious parenchymal signal, masses, mass effect. No abnormal intraparenchymal or extra-axial enhancement. No abnormal extra-axial fluid collections. No extra-axial masses. VASCULAR: Normal major intracranial vascular flow voids present at skull base. SKULL AND UPPER CERVICAL SPINE: No abnormal sellar expansion. No suspicious calvarial bone marrow signal. Craniocervical junction maintained. OTHER: None. MRI ORBITS FINDINGS- moderately motion degraded examination. ORBITS: Reduced diffusion RIGHT optic nerve within the intra-ocular ocular and anterior intraorbital segments with reduced diffusion, expanded bright T2 signal. Enhancement RIGHT optic nerve from the intraorbital segment through the intracanalicular segment. Ocular globes are intact with normal signal. Lenses are located. Preservation of the orbital fat. Normal symmetric appearance of the extraocular muscles. No intra-ocular mass, signal abnormality nor abnormal enhancement. Superior ophthalmic veins are not enlarged. VISUALIZED SINUSES: Trace paranasal sinus mucosal thickening without air-fluid levels. Mastoid air cells are well aerated. SOFT TISSUES: Normal. 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 However, superimposed reduced diffusion within the optic nerve concerning for ischemia/infarct (potentially secondary to local vasculopathy). MRI brain:  Normal. Acute findings discussed with and  reconfirmed by PA Everlene Farrier on 04/16/2016 at 8:23 pm. Electronically Signed: By: Awilda Metro M.D. On: 04/16/2016 20:25   Mr Rockwell [place] WK Contrast  Addendum Date: 04/16/2016   ADDENDUM REPORT: 04/16/2016 21:33 ADDENDUM: Acute findings discussed with and reconfirmed by Dr.Makila Colombe, Neurology on 04/16/2016 at 9:15 pm. Electronically Signed   By: Awilda Metro M.D.   On: 04/16/2016 21:33   Result Date: 04/16/2016 CLINICAL DATA:  RIGHT eye vision loss today. Eye shingles with reported optic nerve swelling, on acyclovir for a week. Migraines. History of hypertension and HIV. EXAM: MRI HEAD AND ORBITS WITHOUT AND WITH CONTRAST TECHNIQUE: Multiplanar, multiecho pulse sequences of the brain and surrounding structures were obtained without and with intravenous contrast. Multiplanar, multiecho pulse sequences of the orbits and surrounding structures were obtained including fat saturation techniques, before and after intravenous contrast administration. CONTRAST:  91mL MULTIHANCE GADOBENATE DIMEGLUMINE 529 MG/ML IV SOLN COMPARISON:  CT HEAD February 28th 2018 and CT angiogram head and neck April 04, 2016 FINDINGS: MRI HEAD FINDINGS- Mild motion degraded examination. INTRACRANIAL CONTENTS: No reduced diffusion to suggest acute ischemia nor hyperacute demyelination. No susceptibility artifact to suggest hemorrhage. The ventricles and sulci are normal for patient's age. No suspicious parenchymal signal, masses, mass effect. No abnormal intraparenchymal or extra-axial enhancement. No abnormal extra-axial fluid collections. No extra-axial masses. VASCULAR: Normal major intracranial vascular flow voids present at skull base. SKULL AND UPPER CERVICAL SPINE: No  abnormal sellar expansion. No suspicious calvarial bone marrow signal. Craniocervical junction maintained. OTHER: None. MRI ORBITS FINDINGS- moderately motion degraded examination. ORBITS: Reduced diffusion RIGHT optic nerve within the intra-ocular ocular and anterior intraorbital segments with reduced diffusion, expanded bright T2 signal. Enhancement RIGHT optic nerve from the intraorbital segment through the intracanalicular segment. Ocular globes are intact with normal signal. Lenses are located. Preservation of the orbital fat. Normal symmetric appearance of the extraocular muscles. No intra-ocular mass, signal abnormality nor abnormal enhancement. Superior ophthalmic veins are not enlarged. VISUALIZED SINUSES: Trace paranasal sinus mucosal thickening without air-fluid levels. Mastoid air cells are well aerated. SOFT TISSUES: Normal. 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 However, superimposed reduced diffusion within the optic nerve concerning for ischemia/infarct (potentially secondary to local vasculopathy). MRI brain:  Normal. Acute findings discussed with and reconfirmed by PA Everlene Farrier on 04/16/2016 at 8:23 pm. Electronically Signed: By: Awilda Metro M.D. On: 04/16/2016 20:25    Assessment: 1. Right painful monocular vision loss. MRI reveals right optic nerve restricted diffusion, most consistent with optic nerve infarction, but also compatible with severe inflammatory manifestation of optic neuritis. DDx for optic nerve infarction includes arteritic (in arteritis, pain would not be unusual - possible underlying etiologies include temporal arteritis, although ESR at recent ED visit was negative) and non-arteritic (unlikely since this would be expected to produce painless vision loss) 2. Given that arteritis is felt to be relatively unlikely, optic neuritis is relatively high on the DDx. Possible etiologies for  optic neuritis include inflammatory (idiopathic optic neuritis, vasculitis), infection (syphilis, toxoplasmosis, Borrelia bergdorferi, cat-scratch fever, tuberculosis, enteroviruses, hepatitis A, measles, mumps and herpes), combined etiology of infection/vasculitis (HSV or VZV vasculitis) and neoplastic (meningeal lymphoma/primary CNS lymphoma). HIV can directly cause optic neuritis, but this is usually bilateral rather than unilateral.  3. Additional discussion regarding the unusually fulminant orbital findings seen on MRI: MRI of the orbits with contrast reveals severe swelling of the right optic nerve, with thickening and avid enhancement of the  optic nerve sheath. 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 However, on my examination there is no cutaneous evidence for shin, as well as no scleral injection, corneal opacity or proptosis. Radiology also noted superimposed reduced diffusion within the optic nerve, a finding which I agree with and which is concerning for Ischemia/infarct, potentially secondary to local vasculopathy. On my review (Neurology) of the images, the enhancement appears to involve the optic nerve sheath suggests extrinsic compression of the optic nerve by inflamed meninges as a possible etiology for the vision loss and nerve infarction. There is possible enhancement of the right optic nerve itself, but the meningeal inflammatory versus infiltrative findings involving the optic nerve sheath predominate. The optic nerve sheath findings are more consistent with infection or malignancy (CNS lymphoma) as the primary etiology for the vision loss rather than optic neuritis (would not generally lead to perioptic inflammation, although the reverse can occur).  4. Noncompliance with HAART since mid-December, per patient. Now with a detectable HIV viral load. This would increase  the probability of an infectious etiology for the right eye symptoms and MRI findings, but would not eliminate autoimmune/inflammatory etiology from the DDx.   Recommendations: 1. Lumbar puncture in the AM. Obtain cell count with differential, protein, glucose, bacterial and fungal cultures, HSV PCR, VZV PCR, EBV PCR, VDRL, oligoclonal bands. Obtain 10 cc additional CSF for flow cytometry and cytology. Discuss with ID any additional labs that need to be obtained for ID work up (see provisional DDx for infectious etiologiies in item #2 of the Assessment). The AM lumbar puncture is need in order to obtain ID input as well as to allow for cytology and flow cytometry - if present, malignant cells or abnormal white cells in these samples degrade if frozen/refrigerated and must be processed with fresh CSF within 3 hours of LP in order to reduce the chances of a false negative result from premature cell degradation.   2. Repeat ESR.  3. C-reactive protein.  4. Recommend pulsed dose steroid therapy with IV methylprednisolone 1000 mg qd x 5 days, if ID feels that infection is less likely than inflammation as the underlying etiology for the patient's optic neuritis and if CNS lymphoma is ruled out. This is a difficult clinical decision to make, as steroids should be administered as soon as possible if the underlying etiology is autoimmune/inflammatory while, on the other hand, if the patient has CNS lymphoma encasing the right optic nerve, one would expect temporary improvement from steroids, followed by recurrence and progression of the disease. If treated with steroids at the time of LP, a false negative result can be obtained in cases of CNS lymphoma. Other types of malignancy would not be expected to regress with steroids.  5. Ophthalmology consult for dilated retinal exam and further contributions to narrowing the DDx. 6. Switch po acyclovir to IV acyclovir regimen.  7. Neurology will follow  closely.  Electronically signed: Dr. Kerney Elbe 04/16/2016, 11:28 PM

## 2016-04-17 DIAGNOSIS — Z830 Family history of human immunodeficiency virus [HIV] disease: Secondary | ICD-10-CM

## 2016-04-17 DIAGNOSIS — Z8249 Family history of ischemic heart disease and other diseases of the circulatory system: Secondary | ICD-10-CM

## 2016-04-17 DIAGNOSIS — I10 Essential (primary) hypertension: Secondary | ICD-10-CM

## 2016-04-17 DIAGNOSIS — H469 Unspecified optic neuritis: Principal | ICD-10-CM

## 2016-04-17 DIAGNOSIS — Z9119 Patient's noncompliance with other medical treatment and regimen: Secondary | ICD-10-CM

## 2016-04-17 DIAGNOSIS — B20 Human immunodeficiency virus [HIV] disease: Secondary | ICD-10-CM

## 2016-04-17 DIAGNOSIS — F17210 Nicotine dependence, cigarettes, uncomplicated: Secondary | ICD-10-CM

## 2016-04-17 DIAGNOSIS — Z833 Family history of diabetes mellitus: Secondary | ICD-10-CM

## 2016-04-17 LAB — BASIC METABOLIC PANEL
Anion gap: 8 (ref 5–15)
BUN: 9 mg/dL (ref 6–20)
CALCIUM: 9 mg/dL (ref 8.9–10.3)
CO2: 26 mmol/L (ref 22–32)
Chloride: 104 mmol/L (ref 101–111)
Creatinine, Ser: 1.03 mg/dL (ref 0.61–1.24)
Glucose, Bld: 117 mg/dL — ABNORMAL HIGH (ref 65–99)
Potassium: 3.4 mmol/L — ABNORMAL LOW (ref 3.5–5.1)
Sodium: 138 mmol/L (ref 135–145)

## 2016-04-17 LAB — CRYPTOCOCCAL ANTIGEN, CSF: Crypto Ag: NEGATIVE

## 2016-04-17 LAB — CBC
HCT: 35.9 % — ABNORMAL LOW (ref 39.0–52.0)
HEMOGLOBIN: 12.1 g/dL — AB (ref 13.0–17.0)
MCH: 30.9 pg (ref 26.0–34.0)
MCHC: 33.7 g/dL (ref 30.0–36.0)
MCV: 91.8 fL (ref 78.0–100.0)
Platelets: 256 10*3/uL (ref 150–400)
RBC: 3.91 MIL/uL — AB (ref 4.22–5.81)
RDW: 13.3 % (ref 11.5–15.5)
WBC: 5.1 10*3/uL (ref 4.0–10.5)

## 2016-04-17 LAB — CSF CELL COUNT WITH DIFFERENTIAL
Eosinophils, CSF: 0 % (ref 0–1)
Eosinophils, CSF: 0 % (ref 0–1)
LYMPHS CSF: 97 % — AB (ref 40–80)
Lymphs, CSF: 93 % — ABNORMAL HIGH (ref 40–80)
MONOCYTE-MACROPHAGE-SPINAL FLUID: 3 % — AB (ref 15–45)
Monocyte-Macrophage-Spinal Fluid: 7 % — ABNORMAL LOW (ref 15–45)
RBC Count, CSF: 12 /mm3 — ABNORMAL HIGH
RBC Count, CSF: 27 /mm3 — ABNORMAL HIGH
SEGMENTED NEUTROPHILS-CSF: 0 % (ref 0–6)
Segmented Neutrophils-CSF: 0 % (ref 0–6)
TUBE #: 1
Tube #: 4
WBC, CSF: 28 /mm3 (ref 0–5)
WBC, CSF: 39 /mm3 (ref 0–5)

## 2016-04-17 LAB — PROTEIN AND GLUCOSE, CSF
Glucose, CSF: 57 mg/dL (ref 40–70)
TOTAL PROTEIN, CSF: 54 mg/dL — AB (ref 15–45)

## 2016-04-17 LAB — RPR: RPR: NONREACTIVE

## 2016-04-17 LAB — VITAMIN B12: VITAMIN B 12: 319 pg/mL (ref 180–914)

## 2016-04-17 MED ORDER — HYDROCODONE-ACETAMINOPHEN 5-325 MG PO TABS
1.0000 | ORAL_TABLET | ORAL | Status: DC | PRN
  Administered 2016-04-17 – 2016-04-21 (×13): 2 via ORAL
  Filled 2016-04-17 (×14): qty 2

## 2016-04-17 MED ORDER — LIDOCAINE HCL 2 % IJ SOLN
10.0000 mL | Freq: Once | INTRAMUSCULAR | Status: DC
  Filled 2016-04-17: qty 10

## 2016-04-17 MED ORDER — LISINOPRIL 40 MG PO TABS
40.0000 mg | ORAL_TABLET | Freq: Every day | ORAL | Status: DC
  Administered 2016-04-17 – 2016-04-22 (×6): 40 mg via ORAL
  Filled 2016-04-17 (×6): qty 1

## 2016-04-17 MED ORDER — ONDANSETRON HCL 4 MG PO TABS: 4.0000 mg | ORAL_TABLET | Freq: Four times a day (QID) | ORAL | Status: DC | PRN

## 2016-04-17 MED ORDER — AMLODIPINE BESYLATE 5 MG PO TABS
5.0000 mg | ORAL_TABLET | Freq: Every day | ORAL | Status: DC
  Administered 2016-04-17 – 2016-04-22 (×6): 5 mg via ORAL
  Filled 2016-04-17 (×6): qty 1

## 2016-04-17 MED ORDER — POTASSIUM CHLORIDE CRYS ER 20 MEQ PO TBCR
40.0000 meq | EXTENDED_RELEASE_TABLET | Freq: Once | ORAL | Status: AC
  Administered 2016-04-17: 40 meq via ORAL
  Filled 2016-04-17: qty 2

## 2016-04-17 MED ORDER — ACETAMINOPHEN 325 MG PO TABS
650.0000 mg | ORAL_TABLET | Freq: Four times a day (QID) | ORAL | Status: DC | PRN
  Administered 2016-04-18: 650 mg via ORAL
  Filled 2016-04-17: qty 2

## 2016-04-17 MED ORDER — ONDANSETRON HCL 4 MG/2ML IJ SOLN: 4.0000 mg | Freq: Four times a day (QID) | INTRAMUSCULAR | Status: DC | PRN

## 2016-04-17 MED ORDER — EMTRICITAB-RILPIVIR-TENOFOV AF 200-25-25 MG PO TABS
1.0000 | ORAL_TABLET | Freq: Every day | ORAL | Status: DC
  Administered 2016-04-17 – 2016-04-22 (×6): 1 via ORAL
  Filled 2016-04-17 (×6): qty 1

## 2016-04-17 MED ORDER — ACETAMINOPHEN 650 MG RE SUPP: 650.0000 mg | Freq: Four times a day (QID) | RECTAL | Status: DC | PRN

## 2016-04-17 MED ORDER — VITAMIN B-12 1000 MCG PO TABS
1000.0000 ug | ORAL_TABLET | Freq: Every day | ORAL | Status: DC
  Administered 2016-04-17 – 2016-04-22 (×6): 1000 ug via ORAL
  Filled 2016-04-17 (×6): qty 1

## 2016-04-17 MED ORDER — LIDOCAINE HCL (PF) 1 % IJ SOLN
INTRAMUSCULAR | Status: AC
  Filled 2016-04-17: qty 30

## 2016-04-17 MED ORDER — DEXTROSE 5 % IV SOLN
700.0000 mg | Freq: Three times a day (TID) | INTRAVENOUS | Status: DC
  Administered 2016-04-17 – 2016-04-21 (×12): 700 mg via INTRAVENOUS
  Filled 2016-04-17 (×15): qty 14

## 2016-04-17 MED ORDER — DOXYCYCLINE HYCLATE 100 MG IV SOLR
100.0000 mg | Freq: Two times a day (BID) | INTRAVENOUS | Status: DC
  Administered 2016-04-17: 100 mg via INTRAVENOUS
  Filled 2016-04-17 (×2): qty 100

## 2016-04-17 MED ORDER — SODIUM CHLORIDE 0.9 % IV SOLN
500.0000 mg | Freq: Two times a day (BID) | INTRAVENOUS | Status: DC
  Administered 2016-04-17 – 2016-04-22 (×10): 500 mg via INTRAVENOUS
  Filled 2016-04-17 (×12): qty 4

## 2016-04-17 NOTE — Progress Notes (Signed)
MD aware of bloody BM. Will continue to monitor and let MD know if there are anymore bloody BM's. Pt. Taught to not flush BM, let RN know so that we can assess properly.

## 2016-04-17 NOTE — Progress Notes (Addendum)
CRITICAL VALUE ALERT  Critical value received:  WBC Date of notification:  04/17/2016  Time of notification:  1215  Critical value read back:YES  Nurse who received alert:  Rhae Hammock  MD notified (1st page):  Etta Quill (PA with neurology)  Time of first page:  89  MD notified (2nd page): Dr. Shon Hale  Time of second page:  Responding MD:  Dr. Shon Hale  Time MD responded:

## 2016-04-17 NOTE — Care Management Note (Signed)
Case Management Note  Patient Details  Name: PEDRAM GOODCHILD MRN: [id number] Date of Birth: 10/12/1965  Subjective/Objective:                    Action/Plan:  NCM can provide [place] letter at discharge to cover non 042 medications. Patient let his ADAP expire , will have to renew for 042 medications. ID following  Expected Discharge Date:                  Expected Discharge Plan:  Home/Self Care  In-House Referral:     Discharge planning Services  [facility]  Post Acute Care Choice:    Choice offered to:     DME Arranged:    DME Agency:     HH Arranged:    HH Agency:     Status of Service:  In process, will continue to follow  If discussed at Long Length of Stay Meetings, dates discussed:    Additional Comments:  Marilu Favre, RN 04/17/2016, 2:57 PM

## 2016-04-17 NOTE — Procedures (Signed)
Indication: MS  Risks of the procedure were dicussed with the patient including post-LP headache, bleeding, infection, weakness/numbness of legs(radiculopathy), death.  The patient/patient's proxy agreed and written consent was obtained.   The patient was prepped and draped, and using sterile technique a 20 gauge quinke spinal needle was inserted in the L4/5 space. The opening pressure was 15 mm H2O. Approximately 24 cc of CSF were obtained and sent for analysis.  No complications were encountered.    Etta Quill PA-C [facility] [phone]  M-F  (8:30 am- 4 PM)  04/17/2016, 9:50 AM

## 2016-04-17 NOTE — Progress Notes (Signed)
 [facility] PROGRESS NOTE  Ryan Cruz MRN:[id number] DOB: 09/06/1965 DOA: 04/16/2016  PCP: Jeffrey Hatcher, MD  Brief History/Interval Summary: 50-year-old male with a past medical history of HIV infection, not being treated since December 2017, history of anxiety and depression, hypertension, presented with loss of vision in his right eye. Patient woke up on 2/24 and he felt like someone had hit him in the head with a hammer, drove a stake through his head. Came to ER, treated for migraine, normal ESR. Pain was unrelenting, came back on 2/28. Diagnosed with shingles. Went to eye doctor and he immediately sent to the ER for MRI. Vision problems started on the 24th, worse on the 28th, by 3/7 he was unable to see out of his right eye. MRI raised concern for optic neuritis. Patient was hospitalized for further management.  Reason for Visit: Optic neuritis  Consultants: Neurology. Infectious disease. Ophthalmology  Procedures: Lumbar puncture 3/9  Antibiotics: IV acyclovir Doxycycline  Subjective/Interval History: Patient states that the pain in his right eye is better. Still has very limited vision. Denies any other complaints.  ROS: Denies any nausea or vomiting  Objective:  Vital Signs  Vitals:   04/17/16 0100 04/17/16 0208 04/17/16 1003 04/17/16 1357  BP: 146/83 (!) 143/84 (!) 149/83 134/83  Pulse: 64 70  (!) 58  Resp: 18 18  18  Temp:  98.8 F (37.1 C)  98.3 F (36.8 C)  TempSrc:  Oral  Oral  SpO2: 97% 99%  100%  Weight:  65.3 kg (143 lb 15.4 oz)    Height:  5' 8" (1.727 m)      Intake/Output Summary (Last 24 hours) at 04/17/16 1449 Last data filed at 04/17/16 1049  Gross per 24 hour  Intake              100 ml  Output                0 ml  Net              100 ml   Filed Weights   04/17/16 0208  Weight: 65.3 kg (143 lb 15.4 oz)    General appearance: alert, cooperative, appears stated age and no distress Resp: clear to auscultation bilaterally Cardio:  regular rate and rhythm, S1, S2 normal, no murmur, click, rub or gallop GI: soft, non-tender; bowel sounds normal; no masses,  no organomegaly Extremities: extremities normal, atraumatic, no cyanosis or edema Neurologic: No focal neurological deficits noted.  Lab Results:  Data Reviewed: I have personally reviewed following labs and imaging studies  CBC:  Recent Labs Lab 04/13/16 1532 04/16/16 1825 04/17/16 0841  WBC 6.3 6.6 5.1  NEUTROABS  --  2.8  --   HGB 12.6* 12.7* 12.1*  HCT 37.3* 37.6* 35.9*  MCV 93.3 92.2 91.8  PLT 281 281 256    Basic Metabolic Panel:  Recent Labs Lab 04/13/16 1532 04/16/16 1825 04/17/16 0841  NA 140 136 138  K 3.8 3.6 3.4*  CL 106 104 104  CO2 27 24 26  GLUCOSE 86 103* 117*  BUN 11 12 9  CREATININE 1.22 1.10 1.03  CALCIUM 9.3 9.0 9.0    GFR: Estimated Creatinine Clearance: 81 mL/min (by C-G formula based on SCr of 1.03 mg/dL).  Liver Function Tests:  Recent Labs Lab 04/13/16 1532  AST 29  ALT 33  ALKPHOS 65  BILITOT 0.4  PROT 7.3  ALBUMIN 4.1    Anemia Panel:  Recent Labs  04/17/16   [place]    Recent Results (from the past 240 hour(s))  CSF culture with Stat gram stain     Status: None (Preliminary result)   Collection Time: 04/17/16 10:00 AM  Result Value Ref Range Status   Specimen Description CSF  TUBE 2  Final   Special Requests Immunocompromised  Final   Gram Stain NO WBC SEEN NO ORGANISMS SEEN   Final   Culture PENDING  Incomplete   Report Status PENDING  Incomplete      Radiology Studies: Mr Jeri Cos Wo Contrast  Addendum Date: 04/16/2016   ADDENDUM REPORT: 04/16/2016 21:33 ADDENDUM: Acute findings discussed with and reconfirmed by Dr.Lindzen, Neurology on 04/16/2016 at 9:15 pm. Electronically Signed   By: Elon Alas M.D.   On: 04/16/2016 21:33   Result Date: 04/16/2016 CLINICAL DATA:  RIGHT eye vision loss today. Eye shingles with reported optic nerve swelling, on acyclovir for a week.  Migraines. History of hypertension and HIV. EXAM: MRI HEAD AND ORBITS WITHOUT AND WITH CONTRAST TECHNIQUE: Multiplanar, multiecho pulse sequences of the brain and surrounding structures were obtained without and with intravenous contrast. Multiplanar, multiecho pulse sequences of the orbits and surrounding structures were obtained including fat saturation techniques, before and after intravenous contrast administration. CONTRAST:  36m MULTIHANCE GADOBENATE DIMEGLUMINE 529 MG/ML IV SOLN COMPARISON:  CT HEAD February 28th 2018 and CT angiogram head and neck April 04, 2016 FINDINGS: MRI HEAD FINDINGS- Mild motion degraded examination. INTRACRANIAL CONTENTS: No reduced diffusion to suggest acute ischemia nor hyperacute demyelination. No susceptibility artifact to suggest hemorrhage. The ventricles and sulci are normal for patient's age. No suspicious parenchymal signal, masses, mass effect. No abnormal intraparenchymal or extra-axial enhancement. No abnormal extra-axial fluid collections. No extra-axial masses. VASCULAR: Normal major intracranial vascular flow voids present at skull base. SKULL AND UPPER CERVICAL SPINE: No abnormal sellar expansion. No suspicious calvarial bone marrow signal. Craniocervical junction maintained. OTHER: None. MRI ORBITS FINDINGS- moderately motion degraded examination. ORBITS: Reduced diffusion RIGHT optic nerve within the intra-ocular ocular and anterior intraorbital segments with reduced diffusion, expanded bright T2 signal. Enhancement RIGHT optic nerve from the intraorbital segment through the intracanalicular segment. Ocular globes are intact with normal signal. Lenses are located. Preservation of the orbital fat. Normal symmetric appearance of the extraocular muscles. No intra-ocular mass, signal abnormality nor abnormal enhancement. Superior ophthalmic veins are not enlarged. VISUALIZED SINUSES: Trace paranasal sinus mucosal thickening without air-fluid levels. Mastoid air  cells are well aerated. SOFT TISSUES: Normal. 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 However, superimposed reduced diffusion within the optic nerve concerning for ischemia/infarct (potentially secondary to local vasculopathy). MRI brain:  Normal. Acute findings discussed with and reconfirmed by PA WWaynetta Peanon 04/16/2016 at 8:23 pm. Electronically Signed: By: CElon AlasM.D. On: 04/16/2016 20:25   Mr ORosealee AlbeeWNOContrast  Addendum Date: 04/16/2016   ADDENDUM REPORT: 04/16/2016 21:33 ADDENDUM: Acute findings discussed with and reconfirmed by Dr.Lindzen, Neurology on 04/16/2016 at 9:15 pm. Electronically Signed   By: CElon AlasM.D.   On: 04/16/2016 21:33   Result Date: 04/16/2016 CLINICAL DATA:  RIGHT eye vision loss today. Eye shingles with reported optic nerve swelling, on acyclovir for a week. Migraines. History of hypertension and HIV. EXAM: MRI HEAD AND ORBITS WITHOUT AND WITH CONTRAST TECHNIQUE: Multiplanar, multiecho pulse sequences of the brain and surrounding structures were obtained without and with intravenous contrast. Multiplanar, multiecho pulse sequences of the orbits  and surrounding structures were obtained including fat saturation techniques, before and after intravenous contrast administration. CONTRAST:  15mL MULTIHANCE GADOBENATE DIMEGLUMINE 529 MG/ML IV SOLN COMPARISON:  CT HEAD February 28th 2018 and CT angiogram head and neck April 04, 2016 FINDINGS: MRI HEAD FINDINGS- Mild motion degraded examination. INTRACRANIAL CONTENTS: No reduced diffusion to suggest acute ischemia nor hyperacute demyelination. No susceptibility artifact to suggest hemorrhage. The ventricles and sulci are normal for patient's age. No suspicious parenchymal signal, masses, mass effect. No abnormal intraparenchymal or extra-axial enhancement. No abnormal extra-axial fluid collections. No extra-axial masses. VASCULAR:  Normal major intracranial vascular flow voids present at skull base. SKULL AND UPPER CERVICAL SPINE: No abnormal sellar expansion. No suspicious calvarial bone marrow signal. Craniocervical junction maintained. OTHER: None. MRI ORBITS FINDINGS- moderately motion degraded examination. ORBITS: Reduced diffusion RIGHT optic nerve within the intra-ocular ocular and anterior intraorbital segments with reduced diffusion, expanded bright T2 signal. Enhancement RIGHT optic nerve from the intraorbital segment through the intracanalicular segment. Ocular globes are intact with normal signal. Lenses are located. Preservation of the orbital fat. Normal symmetric appearance of the extraocular muscles. No intra-ocular mass, signal abnormality nor abnormal enhancement. Superior ophthalmic veins are not enlarged. VISUALIZED SINUSES: Trace paranasal sinus mucosal thickening without air-fluid levels. Mastoid air cells are well aerated. SOFT TISSUES: Normal. 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 However, superimposed reduced diffusion within the optic nerve concerning for ischemia/infarct (potentially secondary to local vasculopathy). MRI brain:  Normal. Acute findings discussed with and reconfirmed by PA WILLIAM DANSIE on 04/16/2016 at 8:23 pm. Electronically Signed: By: Courtnay  Bloomer M.D. On: 04/16/2016 20:25     Medications:  Scheduled: . acyclovir  700 mg Intravenous Q8H  . amLODipine  5 mg Oral Daily  . doxycycline (VIBRAMYCIN) IV  100 mg Intravenous Q12H  . emtricitabine-rilpivir-tenofovir AF  1 tablet Oral Q breakfast  . lidocaine (PF)      . lidocaine  10 mL Infiltration Once  . lisinopril  40 mg Oral Daily   Continuous:  PRN:acetaminophen **OR** acetaminophen, HYDROcodone-acetaminophen, ondansetron **OR** ondansetron (ZOFRAN) IV  Assessment/Plan:  Principal Problem:   Optic neuritis Active Problems:   Human  immunodeficiency virus (HIV) disease (HCC)    Optic neuritis Concern for viral given recent diagnosis of shingles. Patient being treated with IV acyclovir. Concern for other infections given patient's lack of HIV therapy at this time. Patient was started on IV doxycycline. Patient seen by neurology. He is status post lumbar puncture. WBC was 39 and 28 in two different tubes. Total protein 54. Glucose 57. Not conclusive for bacterial meningitis. Gram stain negative. Ophthalmology to see the patient later today. Discussed with Dr. Coomer with infectious disease. CSF analysis not conclusive for the bacterial meningitis. Stop doxycycline. Continue acyclovir for now. Other workup is pending.  HIV HIV viral load, 69,300 on 3/5. CD4 count 620 on 3/5. He stopped taking his medications in December due to financial and logistical issues. Infectious disease is following. He is back on antiretroviral treatment. He may need assistance in arranging medications as an outpatient. Of note, his father is his only family member who knows about his HIV status. He is married but does not have sexual relations with his wife and she is unaware. He requests that his conditions not be discussed in front of family members.  Essential hypertension. Continue with amlodipine and lisinopril.  DVT Prophylaxis: SCDs    Code Status: DNR  Family Communication: Discussed   with the patient  Disposition Plan: Management as outlined above.    LOS: 1 day   [facility] Pager [phone] 04/17/2016, 2:49 PM  If 7PM-7AM, please contact night-coverage at [email or web address], password [facility]

## 2016-04-17 NOTE — Consult Note (Signed)
CC: decreased vision right eye and pain  HPI: Ryan Cruz is a 51 y.o. male w/ POH of RE and PMH below who presents for evaluation of optic neuritis seen on ED. Seen by OD for drop in vision and was referred to the ED to rule out optic neuritis. Reports pain w/ eye movement and then drop in vision OD only. Reports being only able to see shapes moving across OD. No changes OS.    ROS: + hematochezia, neg melena, neg tick bite, neg syphilis history, neg cat scratch, neg joint swelling, neg rash  PMH: Past Medical History:  Diagnosis Date  . Anxiety   . Asthma   . Depression   . HIV (human immunodeficiency virus infection) ([place])   . Hypertension   . Hypoglycemia    sugars drop ion    PSH: Past Surgical History:  Procedure Laterality Date  . COLONOSCOPY  02/12/2012   Procedure: COLONOSCOPY;  Surgeon: Leighton Ruff, MD;  Location: [facility] ENDOSCOPY;  Service: Endoscopy;  Laterality: N/A;  . HERNIA REPAIR     umbilibcal    Meds: No current facility-administered medications on file prior to encounter.    Current Outpatient Prescriptions on File Prior to Encounter  Medication Sig Dispense Refill  . acyclovir (ZOVIRAX) 800 MG tablet Take 1 tablet (800 mg total) by mouth 5 (five) times daily. 50 tablet 0  . amLODipine (NORVASC) 5 MG tablet Take 1 tablet (5 mg total) by mouth daily. 30 tablet 3  . emtricitabine-rilpivir-tenofovir AF (ODEFSEY) 200-25-25 MG TABS tablet Take 1 tablet by mouth daily with breakfast. 30 tablet 3  . HYDROcodone-acetaminophen (NORCO) 5-325 MG tablet Take 1-2 tablets by mouth every 4 (four) hours as needed. 6 tablet 0  . lisinopril (PRINIVIL,ZESTRIL) 40 MG tablet Take 1 tablet (40 mg total) by mouth daily. 90 tablet 3  . ondansetron (ZOFRAN ODT) 4 MG disintegrating tablet Take 1 tablet (4 mg total) by mouth every 8 (eight) hours as needed for nausea or vomiting. 20 tablet 0  . pseudoephedrine-acetaminophen (TYLENOL SINUS) 30-500 MG TABS tablet Take 1 tablet by mouth  every 4 (four) hours as needed (sinus congestion).      SH: Social History   Social History  . Marital status: Married    Spouse name: N/A  . Number of children: N/A  . Years of education: N/A   Occupational History  . medication aide and hairstylist    Social History Main Topics  . Smoking status: Current Every Day Smoker    Packs/day: 0.20    Years: 15.00    Types: Cigarettes  . Smokeless tobacco: Never Used  . Alcohol use 1.2 oz/week    1 Glasses of wine, 1 Shots of liquor per week     Comment: occasional  . Drug use: Yes    Frequency: 7.0 times per week    Types: Marijuana     Comment: marijuana daily  . Sexual activity: Yes    Partners: Male     Comment: pt. declined condoms   Other Topics Concern  . None   Social History Narrative  . None    FH: Family History  Problem Relation Age of Onset  . Diabetes Mother   . Hypertension Mother   . Heart disease Mother   . Congestive Heart Failure Mother   . Diabetes Father   . Hypertension Father   . Diabetes Sister   . HIV Sister   . Hypertension Sister   . Diabetes Brother   .  Hypertension Brother   . Diabetes Brother   . Hypertension Brother   . Diabetes Brother   . Hypertension Brother   . Diabetes Brother   . Hypertension Brother     Exam:  Lucianne Lei: OD: HM OS: 5 pt cc  CVF: OD: HM OS: full to CF  EOM: OD: full d/v OS: full d/v  Pupils: OD: 3->2 mm, + APD OS: 3->2 mm, no APD  IOP: by [place] OD: 11 OS: 20 - squeezing  External: OD: no periorbital edema, no proptosis, good orbicularis strength OS: no periorbital edema, no proptosis, good orbicularis strength   Pen Light Exam: L/L: OD: WNL OS: WNL  C/S: OD: white and quiet OS: white and quiet  K: OD: clear, no abnormal staining OS: clear, no abnormal staining  A/C: OD: grossly deep and quiet appearing by pen light OS: grossly deep and quiet appearing by pen light  I: OD: round and regular OS: round and  regular  L: OD: early [place] OS: early [place]  DFE: dilated @ 5:15 w/ [place] and Phenyl OU  V: OD: clear OS: clear  N: OD:  +disc edema OS: C/D 0.3, no disc edema  M: OD: flat, no obvious macular pathology OS: flat, no obvious macular pathology  V: OD: normal appearing vessels - no evidence of vasculitis OS: normal appearing vessels  P: OD: retina flat 360, no obvious mass/RT/RD - no evidence of acute retinal necrosis OS: retina flat 360, no obvious mass/RT/RD  A/P:  1. Right Optic Neuropathy: - DDx: inflammatory, ischemic, infectious process - CD4 count 620 and previously 510 thus unlikely to be immune reconstitution syndrome and less likely oppotunistics infection but recommend testing for CMV - MRI already confirming optic nerve enhancement - this could be inflammatory optic neuritis in which case IV steroids were found to hasten recovery by 2 weeks but not to improve final visual acuity thus in setting where infectious process can't be ruled out would recommend holding off of steroids until infectious causes can be ruled out   - Recommend RPR, CMV, Lyme, Bartonella  - Recommend ACE / serume lysozyme to rule out sarcoidosis - HIV can have vasculopathy that could lead to ischemic optic neuropathy in which case no treatment available - HAART therapy per ID - Recommend rule out infectious process and agree w/ already performed LP - await labs and cultures to narrow coverage if negative then likely inflammatory or ischemic process such as optic neuritis in which case observing to see if any return in vision - Recommend outpatient ophthalmology follow-up.   2. HIV: - No evidence of of HIV retinopathy on dilated fundus exam  Eleonora Peeler T. Manuella Ghazi, [place] Office: [phone]

## 2016-04-17 NOTE — Consult Note (Addendum)
[facility]ious [facility]       Reason for Consult: optic neuritis    Referring Physician: Dr. Maryland Pink  Principal Problem:   Optic neuritis Active Problems:   Human immunodeficiency virus (HIV) disease ([place])   . acyclovir  700 mg Intravenous Q8H  . amLODipine  5 mg Oral Daily  . doxycycline (VIBRAMYCIN) IV  100 mg Intravenous Q12H  . emtricitabine-rilpivir-tenofovir AF  1 tablet Oral Q breakfast  . lidocaine (PF)      . lidocaine  10 mL Infiltration Once  . lisinopril  40 mg Oral Daily    Recommendations: LP and work up per neurology started [facility] today  Assessment: Optic neuritis in the setting of HIV but intact CD4 count despite recent poor compliance  HIV and restarting Odefsey  ADDENDUM: CSF results noted and some mild inflammation with 28 and 39 WBCs with lymphocyte predominance.  Not consistent with bacterial process.  Will stop doxycycline.  Wait for viral studies but I agree likely non infectious based on these results and would consider steroids to monitor for response but will defer to neurology.   Dr. Johnnye Sima will follow up tomorrow  Antibiotics: acylovir doxycycline  HPI: Ryan Cruz is a 51 y.o. male with HIV previously controlled on Odefsey but out since December 2017 letting his ADAP expire and not renew who has had several days of ocular pain and loss of vision of right eye.  Went to ophthalmologist and noted swelling of optic nerve and sent in for MRI which confirmed findings.  Differential outlined by neurology and LP today to rule out infectious causes.  On acyclovir empirically but no lesions or conjunctivitis of the eye concerning for zoster.  Pain better now.   MRI independently reviewed and optic nerve appears to compressed.   Personally discussed with neurology Dr. Shon Hale and considering steroids depending on LP results.  Review of Systems:  Constitutional: negative for fevers, chills and malaise Gastrointestinal: negative for  diarrhea Integument/breast: negative for rash Musculoskeletal: negative for myalgias and arthralgias All other systems reviewed and are negative    Past Medical History:  Diagnosis Date  . Anxiety   . Asthma   . Depression   . HIV (human immunodeficiency virus infection) ([place])   . Hypertension   . Hypoglycemia    sugars drop ion    Social History  Substance Use Topics  . Smoking status: Current Every Day Smoker    Packs/day: 0.20    Years: 15.00    Types: Cigarettes  . Smokeless tobacco: Never Used  . Alcohol use 1.2 oz/week    1 Glasses of wine, 1 Shots of liquor per week     Comment: occasional    Family History  Problem Relation Age of Onset  . Diabetes Mother   . Hypertension Mother   . Heart disease Mother   . Congestive Heart Failure Mother   . Diabetes Father   . Hypertension Father   . Diabetes Sister   . HIV Sister   . Hypertension Sister   . Diabetes Brother   . Hypertension Brother   . Diabetes Brother   . Hypertension Brother   . Diabetes Brother   . Hypertension Brother   . Diabetes Brother   . Hypertension Brother     No Known Allergies  Physical Exam: Constitutional: in no apparent distress and alert  Vitals:   04/17/16 0100 04/17/16 0208  BP: 146/83 (!) 143/84  Pulse: 64 70  Resp: 18 18  Temp:  98.8 F (37.1 C)   EYES: anicteric ENMT: no thrush Cardiovascular: Cor RRR Respiratory: CTA B; normal respiratory effort GI: Bowel sounds are normal, liver is not enlarged, spleen is not enlarged Musculoskeletal: no pedal edema noted Skin: negatives: no rash Hematologic: no cervical lad  Lab Results  Component Value Date   WBC 6.6 04/16/2016   HGB 12.7 (L) 04/16/2016   HCT 37.6 (L) 04/16/2016   MCV 92.2 04/16/2016   PLT 281 04/16/2016    Lab Results  Component Value Date   CREATININE 1.10 04/16/2016   BUN 12 04/16/2016   NA 136 04/16/2016   K 3.6 04/16/2016   CL 104 04/16/2016   CO2 24 04/16/2016    Lab Results  Component  Value Date   ALT 33 04/13/2016   AST 29 04/13/2016   ALKPHOS 65 04/13/2016     Microbiology: No results found for this or any previous visit (from the past 240 hour(s)).  Scharlene Gloss, [facility] [place] [email or web address] [phone] pager  [phone] cell 04/17/2016, 9:49 AM

## 2016-04-17 NOTE — Progress Notes (Signed)
Pharmacy Anti-infective Consult  Ryan Cruz is a 51 y.o. male admitted on 04/16/2016 with shingles that has been affecting his vision, pt has HIV and has been off antivirals since December.  Pharmacy has been consulted for acyclovir dosing; was on acyclovir 800mg  PO QID at home since 2/28.  Plan: Acyclovir 700mg  IV Q8H; f/u to transition back to PO.   Temp (24hrs), Avg:98.7 F (37.1 C), Min:98.7 F (37.1 C), Max:98.7 F (37.1 C)   Recent Labs Lab 04/13/16 1532 04/16/16 1825  WBC 6.3 6.6  CREATININE 1.22 1.10    Estimated Creatinine Clearance: 78.5 mL/min (by C-G formula based on SCr of 1.1 mg/dL).    No Known Allergies   Thank you for allowing pharmacy to be a part of this patient's care.  Wynona Neat, PharmD, BCPS  04/17/2016 1:40 AM

## 2016-04-17 NOTE — Progress Notes (Signed)
Prelim CSF results reviewed. He has a mild lymphocytic pleocytosis with slight elevation in protein and normal glucose.  Crypto Ag negative. Gram stain negative.   Profile not c/w TB or fungus so likely safe to initiate high-dose IV Solumedrol for treatment of optic neuritis. Will start Solumedrol 500 mg IV BID. This was previously discussed with the patient earlier today.   Vitamin B12 low normal at 319. Will initiate oral supplementation with 1000 mcg daily.

## 2016-04-17 NOTE — Progress Notes (Signed)
CRITICAL VALUE ALERT  Critical value received:  WBC  Date of notification:  04/16/16  Time of notification:  1215  Critical value read back yes  Nurse who received alert:  Rhae Hammock  MD notified (1st page): Dr. Maryland Pink  Time of first page:  1215 MD notified (2nd page):  Time of second page: 1450  Responding MD:  Dr. Maryland Pink  Time MD responded:  [id number]  Per MD- notify neurology.

## 2016-04-17 NOTE — Progress Notes (Signed)
Neurology Progress Note  Subjective: No overnight events, no new complaints this morning. He reports that his vision in his right eye is unchanged and he is still unable to see from that side. He has some mild discomfort behind the right eye with eye movements. He also reports some discomfort around the [name] and R forehead with touch. Otherwise he is not having any significant headache. He denies any new neurologic complaints. Remainder of 14-point ROS is unremarkable.   Medications reviewed and reconciled.   Pertinent meds: Acyclovir 700 mg IV q8h  Current Meds:   Current Facility-Administered Medications:  .  acetaminophen (TYLENOL) tablet 650 mg, 650 mg, Oral, Q6H PRN **OR** acetaminophen (TYLENOL) suppository 650 mg, 650 mg, Rectal, Q6H PRN, Karmen Bongo, MD .  acyclovir (ZOVIRAX) 700 mg in dextrose 5 % 100 mL IVPB, 700 mg, Intravenous, Q8H, Veronda P Bryk, RPH .  amLODipine (NORVASC) tablet 5 mg, 5 mg, Oral, Daily, Karmen Bongo, MD .  doxycycline (VIBRAMYCIN) 100 mg in dextrose 5 % 250 mL IVPB, 100 mg, Intravenous, Q12H, Karmen Bongo, MD .  emtricitabine-rilpivir-tenofovir AF (ODEFSEY) 200-25-25 MG per tablet 1 tablet, 1 tablet, Oral, Q breakfast, Karmen Bongo, MD .  HYDROcodone-acetaminophen (NORCO/VICODIN) 5-325 MG per tablet 1-2 tablet, 1-2 tablet, Oral, Q4H PRN, Karmen Bongo, MD, 2 tablet at 04/17/16 0222 .  lisinopril (PRINIVIL,ZESTRIL) tablet 40 mg, 40 mg, Oral, Daily, Karmen Bongo, MD .  ondansetron [facility]) tablet 4 mg, 4 mg, Oral, Q6H PRN **OR** ondansetron (ZOFRAN) injection 4 mg, 4 mg, Intravenous, Q6H PRN, Karmen Bongo, MD  Objective:  Temp:  [98.7 F (37.1 C)-98.8 F (37.1 C)] 98.8 F (37.1 C) (03/09 0208) Pulse Rate:  [51-70] 70 (03/09 0208) Resp:  [18] 18 (03/09 0208) BP: (135-153)/(83-98) 143/84 (03/09 0208) SpO2:  [97 %-100 %] 99 % (03/09 0208) Weight:  [65.3 kg (143 lb 15.4 oz)] 65.3 kg (143 lb 15.4 oz) (03/09 5809)  General: WDWN AA male lying in  bed in NAD. He was initially asleep but woek easily to voice. Alert, oriented x4. Speech is clear without dysarthria. Affect is bright. Comportment is normal. He wears sunglasses even in the darkened room, c/o photophobia.  HEENT: Neck is supple without lymphadenopathy. Mucous membranes are moist and the oropharynx is clear. Sclerae are anicteric. There is no conjunctival injection.  CV: Regular, no murmur. Carotid pulses are 2+ and symmetric with no bruits. Distal pulses 2+ and symmetric.  Lungs: CTAB  Extremities: No C/C/E. Neuro: MS: As noted above.  CN: The left pupil reacts briskly from 3-->2 mm. The right is more sluggish but still reacts. He has a mild R APD. Vision is limited in the [name]. EOMI, no nystagmus. Facial sensation is intact to light touch. Face is symmetric at rest with normal strength and mobility. Hearing is intact to conversational voice. Voice is normal in tone and quality. Palate elevates symmetrically. Uvula is midline. Bilateral SCM and trapezii are 5/5. Tongue is midline with normal bulk and mobility.  Motor: Normal bulk, tone, and strength throughout. No pronator drift. No tremor or other abnormal movements are observed.  Sensation: Intact to light touch.  DTRs: 2+, symmetric. Toes are downgoing bilaterally. No pathological reflexes.  Coordination: Finger-to-nose without dysmetria bilaterally.    Labs: Lab Results  Component Value Date   WBC 6.6 04/16/2016   HGB 12.7 (L) 04/16/2016   HCT 37.6 (L) 04/16/2016   PLT 281 04/16/2016   GLUCOSE 103 (H) 04/16/2016   CHOL 179 02/28/2015   TRIG 186 (  H) 02/28/2015   HDL 41 02/28/2015   LDLCALC 101 02/28/2015   ALT 33 04/13/2016   AST 29 04/13/2016   NA 136 04/16/2016   K 3.6 04/16/2016   CL 104 04/16/2016   CREATININE 1.10 04/16/2016   BUN 12 04/16/2016   CO2 24 04/16/2016   TSH 0.887 04/30/2008   HGBA1C 5.2 01/29/2014   MICROALBUR 1.17 09/07/2013   CBC Latest Ref Rng & Units 04/16/2016 04/13/2016 04/04/2016  WBC  4.0 - 10.5 K/uL 6.6 6.3 4.6  Hemoglobin 13.0 - 17.0 g/dL 12.7(L) 12.6(L) 13.7  Hematocrit 39.0 - 52.0 % 37.6(L) 37.3(L) 39.3  Platelets 150 - 400 K/uL 281 281 251    Lab Results  Component Value Date   HGBA1C 5.2 01/29/2014   Lab Results  Component Value Date   ALT 33 04/13/2016   AST 29 04/13/2016   ALKPHOS 65 04/13/2016   BILITOT 0.4 04/13/2016   CD4 04/13/16 620 (510 11/20/2015) HIV RNA  04/13/16 69,300 (<20 11/20/2015)  Pending labs:  Bartonella Abs ACE ANA CMV DNA RPR  Radiology:  I have personally and independently reviewed the MRI brain and orbits with and without contrast. This shows enhancement of the R optic nerve along its course. There is also restricted diffusion of the R optic nerve. The brain itself is unremarkable. The study shows mild motion artifact.   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 The restricted diffusion in the nerve raises the possibility of ischemic though this may also be seen with demyelination. Agree with differential as outlined by Dr. Cheral Marker. His HIV status raises concern for possible opportunistic infections and lymphoma, but CD4 count of 620 mitigates this risk significantly, particularly against CNS lymphoma, CMV infection, and EBV infection. He never had a vesicular rash to confirm ophthalmic zoster but zoster sine herpete is possible, which would then place him at risk of VZV vasculopathy despite appropriate CD4 count.   Will proceed with LP. This was discussed with the patient, including potential risks and expected benefits, and he has agreed to proceed. Will send CSF for cell count with diff, protein, glucose, gs/cx, fungal cx, crypto Ag, AFB stain/cx, VZV PCR, VZV IgG/IgM, EBV PCR, IgG index, oligoclonal bands, cytology, and flow cytometry. Await ANA, ACE, RPR. Add vitamin B12.   Will likely need to institute high-dose IV Solumedrol but will exclude possible fungal or  TB infection with preliminary CSF studies first.   2. HIV infection: Appreciate ID assistance. Off antiretrovirals since 01/2016 due to logistical issues with paperwork/appointments but CD4 count 620 on 04/13/16 with increasing viral load vs October 2017. Back on antiretrovirals here.    Fall risk: Risk factors for falls include decreased vision in [name]. Patient educated on risk of falls. Fall precautions. Limit psychoactive medications and sedating medications.  Delirium risk: Some small risk given underlying HIV infection, will continue to monitor. Risk may increase if he ends up on high-dose steroids for his optic neuritis. Continue to optimize metabolic status as necessary. Minimize the use of opiates, benzos or any medication with strong anticholinergic properties as much as possible. Optimize sleep-wake cycles as much as you can by keeping the room bright with activity during the day and dark and quiet at night.    Needs outpatient neurology follow-up?: To be determined  This was discussed with the patient and his son. Of note, only the patient's father Orpah Greek) is aware of his HIV status  so this was not discussed with his son in the room. Education was provided on the diagnosis and expected evaluation and treatment. They are in agreement with the plan as noted. They were given the opportunity to ask any questions and these were addressed to their satisfaction.   Melba Coon, MD [facility]

## 2016-04-17 NOTE — Progress Notes (Addendum)
Pt. Told nurse tech that he had a small bloody BM. I went to ask pt. And he said it was a small bright red BM. MD (Dr. Maryland Pink) paged. NO response. Will continue to monitor.

## 2016-04-18 DIAGNOSIS — R51 Headache: Secondary | ICD-10-CM

## 2016-04-18 DIAGNOSIS — Z21 Asymptomatic human immunodeficiency virus [HIV] infection status: Secondary | ICD-10-CM

## 2016-04-18 LAB — CBC
HCT: 40.5 % (ref 39.0–52.0)
HEMOGLOBIN: 13.8 g/dL (ref 13.0–17.0)
MCH: 31.2 pg (ref 26.0–34.0)
MCHC: 34.1 g/dL (ref 30.0–36.0)
MCV: 91.6 fL (ref 78.0–100.0)
PLATELETS: 296 10*3/uL (ref 150–400)
RBC: 4.42 MIL/uL (ref 4.22–5.81)
RDW: 13.2 % (ref 11.5–15.5)
WBC: 7.5 10*3/uL (ref 4.0–10.5)

## 2016-04-18 LAB — BASIC METABOLIC PANEL
Anion gap: 11 (ref 5–15)
BUN: 12 mg/dL (ref 6–20)
CHLORIDE: 100 mmol/L — AB (ref 101–111)
CO2: 24 mmol/L (ref 22–32)
CREATININE: 1.16 mg/dL (ref 0.61–1.24)
Calcium: 9.7 mg/dL (ref 8.9–10.3)
GFR calc Af Amer: >60 mL/min (ref >60)
GFR calc non Af Amer: >60 mL/min (ref >60)
Glucose, Bld: 150 mg/dL — ABNORMAL HIGH (ref 65–99)
Potassium: 4.5 mmol/L (ref 3.5–5.1)
SODIUM: 135 mmol/L (ref 135–145)

## 2016-04-18 LAB — VARICELLA ZOSTER ANTIBODY, IGG: VARICELLA IGG: 222 {index} (ref >165)

## 2016-04-18 LAB — ACID FAST SMEAR (AFB): ACID FAST SMEAR - AFSCU2: NEGATIVE

## 2016-04-18 LAB — ACID FAST SMEAR (AFB, MYCOBACTERIA)

## 2016-04-18 MED ORDER — SENNA 8.6 MG PO TABS
2.0000 | ORAL_TABLET | Freq: Every day | ORAL | Status: DC
  Administered 2016-04-18 – 2016-04-22 (×5): 17.2 mg via ORAL
  Filled 2016-04-18 (×5): qty 2

## 2016-04-18 NOTE — Progress Notes (Signed)
INFECTIOUS DISEASE PROGRESS NOTE  ID: Ryan Cruz is a 51 y.o. male with  Principal Problem:   Optic neuritis Active Problems:   HIV (human immunodeficiency virus infection) ([place])  Subjective: [name] improving- decreased pain, improved vision (shapes of people)  Abtx:  Anti-infectives    Start     Dose/Rate Route Frequency Ordered Stop   04/17/16 1000  doxycycline (VIBRAMYCIN) 100 mg in dextrose 5 % 250 mL IVPB  Status:  Discontinued     100 mg 125 mL/hr over 120 Minutes Intravenous Every 12 hours 04/17/16 0207 04/17/16 1510   04/17/16 0800  emtricitabine-rilpivir-tenofovir AF (ODEFSEY) 200-25-25 MG per tablet 1 tablet     1 tablet Oral Daily with breakfast 04/17/16 0207     04/17/16 0800  acyclovir (ZOVIRAX) 700 mg in dextrose 5 % 100 mL IVPB     700 mg 114 mL/hr over 60 Minutes Intravenous Every 8 hours 04/17/16 0143     04/16/16 2130  acyclovir (ZOVIRAX) 675 mg in dextrose 5 % 100 mL IVPB     10 mg/kg  67.6 kg 113.5 mL/hr over 60 Minutes Intravenous  Once 04/16/16 2057 04/17/16 0144   04/16/16 2100  doxycycline (VIBRAMYCIN) 100 mg in dextrose 5 % 250 mL IVPB     100 mg 125 mL/hr over 120 Minutes Intravenous  Once 04/16/16 2057 04/17/16 0144      Medications:  Scheduled: . acyclovir  700 mg Intravenous Q8H  . amLODipine  5 mg Oral Daily  . emtricitabine-rilpivir-tenofovir AF  1 tablet Oral Q breakfast  . lidocaine  10 mL Infiltration Once  . lisinopril  40 mg Oral Daily  . methylPREDNISolone (SOLU-MEDROL) injection  500 mg Intravenous Q12H  . vitamin B-12  1,000 mcg Oral Daily    Objective: Vital signs in last 24 hours: Temp:  [98.1 F (36.7 C)-99.1 F (37.3 C)] 98.1 F (36.7 C) (03/10 0544) Pulse Rate:  [58-68] 62 (03/10 0544) Resp:  [18] 18 (03/10 0544) BP: (129-141)/(77-83) 141/81 (03/10 0544) SpO2:  [100 %] 100 % (03/09 2205)   General appearance: alert, cooperative and no distress Eyes: pupils =, no photophobia.  Resp: clear to auscultation  bilaterally Cardio: regular rate and rhythm GI: normal findings: bowel sounds normal and soft, non-tender  Lab Results  Recent Labs  04/17/16 0841 04/18/16 0536  WBC 5.1 7.5  HGB 12.1* 13.8  HCT 35.9* 40.5  NA 138 135  K 3.4* 4.5  CL 104 100*  CO2 26 24  BUN 9 12  CREATININE 1.03 1.16   Liver Panel No results for input(s): PROT, ALBUMIN, AST, ALT, ALKPHOS, BILITOT, BILIDIR, IBILI in the last 72 hours. Sedimentation Rate No results for input(s): ESRSEDRATE in the last 72 hours. C-Reactive Protein No results for input(s): CRP in the last 72 hours.  Microbiology: Recent Results (from the past 240 hour(s))  Culture, blood (routine x 2)     Status: None (Preliminary result)   Collection Time: 04/16/16 11:34 PM  Result Value Ref Range Status   Specimen Description BLOOD RIGHT ARM  Final   Special Requests AEROBIC BOTTLE ONLY 7ML  Final   Culture NO GROWTH 1 DAY  Final   Report Status PENDING  Incomplete  Culture, blood (routine x 2)     Status: None (Preliminary result)   Collection Time: 04/16/16 11:50 PM  Result Value Ref Range Status   Specimen Description BLOOD LEFT HAND  Final   Special Requests AEROBIC BOTTLE ONLY 5ML  Final  Culture NO GROWTH 1 DAY  Final   Report Status PENDING  Incomplete  CSF culture with Stat gram stain     Status: None (Preliminary result)   Collection Time: 04/17/16 10:00 AM  Result Value Ref Range Status   Specimen Description CSF  TUBE 2  Final   Special Requests Immunocompromised  Final   Gram Stain NO WBC SEEN NO ORGANISMS SEEN   Final   Culture NO GROWTH < 24 HOURS  Final   Report Status PENDING  Incomplete    Studies/Results: Mr Ryan Cruz Wo Contrast  Addendum Date: 04/16/2016   ADDENDUM REPORT: 04/16/2016 21:33 ADDENDUM: Acute findings discussed with and reconfirmed by Dr.Lindzen, Neurology on 04/16/2016 at 9:15 pm. Electronically Signed   By: Elon Alas M.D.   On: 04/16/2016 21:33   Result Date: 04/16/2016 CLINICAL DATA:   RIGHT eye vision loss today. Eye shingles with reported optic nerve swelling, on acyclovir for a week. Migraines. History of hypertension and HIV. EXAM: MRI HEAD AND ORBITS WITHOUT AND WITH CONTRAST TECHNIQUE: Multiplanar, multiecho pulse sequences of the brain and surrounding structures were obtained without and with intravenous contrast. Multiplanar, multiecho pulse sequences of the orbits and surrounding structures were obtained including fat saturation techniques, before and after intravenous contrast administration. CONTRAST:  65mL MULTIHANCE GADOBENATE DIMEGLUMINE 529 MG/ML IV SOLN COMPARISON:  CT HEAD February 28th 2018 and CT angiogram head and neck April 04, 2016 FINDINGS: MRI HEAD FINDINGS- Mild motion degraded examination. INTRACRANIAL CONTENTS: No reduced diffusion to suggest acute ischemia nor hyperacute demyelination. No susceptibility artifact to suggest hemorrhage. The ventricles and sulci are normal for patient's age. No suspicious parenchymal signal, masses, mass effect. No abnormal intraparenchymal or extra-axial enhancement. No abnormal extra-axial fluid collections. No extra-axial masses. VASCULAR: Normal major intracranial vascular flow voids present at skull base. SKULL AND UPPER CERVICAL SPINE: No abnormal sellar expansion. No suspicious calvarial bone marrow signal. Craniocervical junction maintained. OTHER: None. MRI ORBITS FINDINGS- moderately motion degraded examination. ORBITS: Reduced diffusion RIGHT optic nerve within the intra-ocular ocular and anterior intraorbital segments with reduced diffusion, expanded bright T2 signal. Enhancement RIGHT optic nerve from the intraorbital segment through the intracanalicular segment. Ocular globes are intact with normal signal. Lenses are located. Preservation of the orbital fat. Normal symmetric appearance of the extraocular muscles. No intra-ocular mass, signal abnormality nor abnormal enhancement. Superior ophthalmic veins are not enlarged.  VISUALIZED SINUSES: Trace paranasal sinus mucosal thickening without air-fluid levels. Mastoid air cells are well aerated. SOFT TISSUES: Normal. 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 However, superimposed reduced diffusion within the optic nerve concerning for ischemia/infarct (potentially secondary to local vasculopathy). MRI brain:  Normal. Acute findings discussed with and reconfirmed by PA Waynetta Pean on 04/16/2016 at 8:23 pm. Electronically Signed: By: Elon Alas M.D. On: 04/16/2016 20:25   Mr Rosealee Albee SN Contrast  Addendum Date: 04/16/2016   ADDENDUM REPORT: 04/16/2016 21:33 ADDENDUM: Acute findings discussed with and reconfirmed by Dr.Lindzen, Neurology on 04/16/2016 at 9:15 pm. Electronically Signed   By: Elon Alas M.D.   On: 04/16/2016 21:33   Result Date: 04/16/2016 CLINICAL DATA:  RIGHT eye vision loss today. Eye shingles with reported optic nerve swelling, on acyclovir for a week. Migraines. History of hypertension and HIV. EXAM: MRI HEAD AND ORBITS WITHOUT AND WITH CONTRAST TECHNIQUE: Multiplanar, multiecho pulse sequences of the brain and surrounding structures were obtained without and with intravenous contrast. Multiplanar, multiecho pulse sequences of the  orbits and surrounding structures were obtained including fat saturation techniques, before and after intravenous contrast administration. CONTRAST:  87mL MULTIHANCE GADOBENATE DIMEGLUMINE 529 MG/ML IV SOLN COMPARISON:  CT HEAD February 28th 2018 and CT angiogram head and neck April 04, 2016 FINDINGS: MRI HEAD FINDINGS- Mild motion degraded examination. INTRACRANIAL CONTENTS: No reduced diffusion to suggest acute ischemia nor hyperacute demyelination. No susceptibility artifact to suggest hemorrhage. The ventricles and sulci are normal for patient's age. No suspicious parenchymal signal, masses, mass effect. No abnormal intraparenchymal or  extra-axial enhancement. No abnormal extra-axial fluid collections. No extra-axial masses. VASCULAR: Normal major intracranial vascular flow voids present at skull base. SKULL AND UPPER CERVICAL SPINE: No abnormal sellar expansion. No suspicious calvarial bone marrow signal. Craniocervical junction maintained. OTHER: None. MRI ORBITS FINDINGS- moderately motion degraded examination. ORBITS: Reduced diffusion RIGHT optic nerve within the intra-ocular ocular and anterior intraorbital segments with reduced diffusion, expanded bright T2 signal. Enhancement RIGHT optic nerve from the intraorbital segment through the intracanalicular segment. Ocular globes are intact with normal signal. Lenses are located. Preservation of the orbital fat. Normal symmetric appearance of the extraocular muscles. No intra-ocular mass, signal abnormality nor abnormal enhancement. Superior ophthalmic veins are not enlarged. VISUALIZED SINUSES: Trace paranasal sinus mucosal thickening without air-fluid levels. Mastoid air cells are well aerated. SOFT TISSUES: Normal. 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 However, superimposed reduced diffusion within the optic nerve concerning for ischemia/infarct (potentially secondary to local vasculopathy). MRI brain:  Normal. Acute findings discussed with and reconfirmed by PA Waynetta Pean on 04/16/2016 at 8:23 pm. Electronically Signed: By: Elon Alas M.D. On: 04/16/2016 20:25     Assessment/Plan: Optic Neuritis He is now on steroids.  His CSF shows protein and an elevated WBC as well as many RBC.  Lyme is not endemic to this area, would not suspect unless he has spent time outside [place] during lyme season.  His RPR is (-). Await CSF VDRL. His Varicella IgG suggests immunity, needs IgM Continue Acyclovir while awaiting HSV on CSF Pending- ANA, ACE, CSF CMV/HSV/VZV PCR. Borellia, Bartonella. CSF oligoclonal  bands.   HIV HIV 1 RNA Quant (copies/mL)  Date Value  11/20/2015 <20  07/01/2015 136,877 (H)  02/28/2015 <20   CD4 T Cell Abs (/uL)  Date Value  04/13/2016 620  11/20/2015 510  07/01/2015 550   With CD4 of 620 five days ago, I am doubtful that this is an OI.  He had been off ART for ~1.5 months due to ADAP lapse. This has been renewed.  continue odefsy  My great appreciation to Neuro and Ophtho.   Steroids Day 1 Total days of antibiotics: 2 acyclovir         Bobby Rumpf Infectious Diseases (pager) [phone] [email or web address] 04/18/2016, 11:46 AM  LOS: 2 days

## 2016-04-18 NOTE — Progress Notes (Signed)
[facility] PROGRESS NOTE  Ryan Cruz VEH:[id number] DOB: 09/19/1965 DOA: 04/16/2016  PCP: Bobby Rumpf, MD  Brief History/Interval Summary: 51 year old male with a past medical history of HIV infection, not being treated since December 2017, history of anxiety and depression, hypertension, presented with loss of vision in his right eye. Patient woke up on 2/24 and he felt like someone had hit him in the head with a hammer, drove a stake through his head. Came to ER, treated for migraine, normal ESR. Pain was unrelenting, came back on 2/28. Diagnosed with shingles. Went to eye doctor and he immediately sent to the ER for MRI. Vision problems started on the 24th, worse on the 28th, by 3/7 he was unable to see out of his right eye. MRI raised concern for optic neuritis. Patient was hospitalized for further management.  Reason for Visit: Optic neuritis  Consultants: Neurology. Infectious disease. Ophthalmology  Procedures: Lumbar puncture 3/9  Antibiotics: IV acyclovir Doxycycline stopped on 3/9.  Subjective/Interval History: Patient states that he is feeling better. He thinks that his vision could be improving, although still very blurry. Not as much pain in his right eye as before. Feels constipated.  ROS: Denies any nausea or vomiting  Objective:  Vital Signs  Vitals:   04/17/16 1003 04/17/16 1357 04/17/16 2205 04/18/16 0544  BP: (!) 149/83 134/83 129/77 (!) 141/81  Pulse:  (!) 58 68 62  Resp:  _0 Temp:  98.3 F (36.8 C) 99.1 F (37.3 C) 98.1 F (36.7 C)  TempSrc:  Oral Oral Oral  SpO2:  100% 100%   Weight:      Height:        Intake/Output Summary (Last 24 hours) at 04/18/16 0843 Last data filed at 04/18/16 0500  Gross per 24 hour  Intake              732 ml  Output                0 ml  Net              732 ml   Filed Weights   04/17/16 0208  Weight: 65.3 kg (143 lb 15.4 oz)    General appearance: alert, cooperative, appears stated age and no  distress Resp: clear to auscultation bilaterally Cardio: regular rate and rhythm, S1, S2 normal, no murmur, click, rub or gallop GI: soft, non-tender; bowel sounds normal; no masses,  no organomegaly Neurologic: No focal neurological deficits noted.  Lab Results:  Data Reviewed: I have personally reviewed following labs and imaging studies  CBC:  Recent Labs Lab 04/13/16 1532 04/16/16 1825 04/17/16 0841 04/18/16 0536  WBC 6.3 6.6 5.1 7.5  NEUTROABS  --  2.8  --   --   HGB 12.6* 12.7* 12.1* 13.8  HCT 37.3* 37.6* 35.9* 40.5  MCV 93.3 92.2 91.8 91.6  PLT 281 281 256 836    Basic Metabolic Panel:  Recent Labs Lab 04/13/16 1532 04/16/16 1825 04/17/16 0841 04/18/16 0536  NA 140 136 138 135  K 3.8 3.6 3.4* 4.5  CL 106 104 104 100*  CO2 _1 GLUCOSE 86 103* 117* 150*  BUN _2 CREATININE 1.22 1.10 1.03 1.16  CALCIUM 9.3 9.0 9.0 9.7    GFR: Estimated Creatinine Clearance: 71.9 mL/min (by C-G formula based on SCr of 1.16 mg/dL).  Liver Function Tests:  Recent Labs Lab 04/13/16 1532  AST 29  ALT 33  ALKPHOS 65  BILITOT 0.4  PROT 7.3  ALBUMIN 4.1    Anemia Panel:  Recent Labs  04/17/16 1041  VITAMINB12 319    Recent Results (from the past 240 hour(s))  CSF culture with Stat gram stain     Status: None (Preliminary result)   Collection Time: 04/17/16 10:00 AM  Result Value Ref Range Status   Specimen Description CSF  TUBE 2  Final   Special Requests Immunocompromised  Final   Gram Stain NO WBC SEEN NO ORGANISMS SEEN   Final   Culture PENDING  Incomplete   Report Status PENDING  Incomplete      Radiology Studies: Mr Jeri Cos Wo Contrast  Addendum Date: 04/16/2016   ADDENDUM REPORT: 04/16/2016 21:33 ADDENDUM: Acute findings discussed with and reconfirmed by Dr.Lindzen, Neurology on 04/16/2016 at 9:15 pm. Electronically Signed   By: Elon Alas M.D.   On: 04/16/2016 21:33   Result Date: 04/16/2016 CLINICAL DATA:  RIGHT eye vision  loss today. Eye shingles with reported optic nerve swelling, on acyclovir for a week. Migraines. History of hypertension and HIV. EXAM: MRI HEAD AND ORBITS WITHOUT AND WITH CONTRAST TECHNIQUE: Multiplanar, multiecho pulse sequences of the brain and surrounding structures were obtained without and with intravenous contrast. Multiplanar, multiecho pulse sequences of the orbits and surrounding structures were obtained including fat saturation techniques, before and after intravenous contrast administration. CONTRAST:  32m MULTIHANCE GADOBENATE DIMEGLUMINE 529 MG/ML IV SOLN COMPARISON:  CT HEAD February 28th 2018 and CT angiogram head and neck April 04, 2016 FINDINGS: MRI HEAD FINDINGS- Mild motion degraded examination. INTRACRANIAL CONTENTS: No reduced diffusion to suggest acute ischemia nor hyperacute demyelination. No susceptibility artifact to suggest hemorrhage. The ventricles and sulci are normal for patient's age. No suspicious parenchymal signal, masses, mass effect. No abnormal intraparenchymal or extra-axial enhancement. No abnormal extra-axial fluid collections. No extra-axial masses. VASCULAR: Normal major intracranial vascular flow voids present at skull base. SKULL AND UPPER CERVICAL SPINE: No abnormal sellar expansion. No suspicious calvarial bone marrow signal. Craniocervical junction maintained. OTHER: None. MRI ORBITS FINDINGS- moderately motion degraded examination. ORBITS: Reduced diffusion RIGHT optic nerve within the intra-ocular ocular and anterior intraorbital segments with reduced diffusion, expanded bright T2 signal. Enhancement RIGHT optic nerve from the intraorbital segment through the intracanalicular segment. Ocular globes are intact with normal signal. Lenses are located. Preservation of the orbital fat. Normal symmetric appearance of the extraocular muscles. No intra-ocular mass, signal abnormality nor abnormal enhancement. Superior ophthalmic veins are not enlarged. VISUALIZED  SINUSES: Trace paranasal sinus mucosal thickening without air-fluid levels. Mastoid air cells are well aerated. SOFT TISSUES: Normal. 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 However, superimposed reduced diffusion within the optic nerve concerning for ischemia/infarct (potentially secondary to local vasculopathy). MRI brain:  Normal. Acute findings discussed with and reconfirmed by PA WWaynetta Peanon 04/16/2016 at 8:23 pm. Electronically Signed: By: CElon AlasM.D. On: 04/16/2016 20:25   Mr ORosealee AlbeeWOQContrast  Addendum Date: 04/16/2016   ADDENDUM REPORT: 04/16/2016 21:33 ADDENDUM: Acute findings discussed with and reconfirmed by Dr.Lindzen, Neurology on 04/16/2016 at 9:15 pm. Electronically Signed   By: CElon AlasM.D.   On: 04/16/2016 21:33   Result Date: 04/16/2016 CLINICAL DATA:  RIGHT eye vision loss today. Eye shingles with reported optic nerve swelling, on acyclovir for a week. Migraines. History of hypertension and HIV. EXAM: MRI HEAD AND ORBITS WITHOUT AND WITH CONTRAST TECHNIQUE: Multiplanar, multiecho pulse  sequences of the brain and surrounding structures were obtained without and with intravenous contrast. Multiplanar, multiecho pulse sequences of the orbits and surrounding structures were obtained including fat saturation techniques, before and after intravenous contrast administration. CONTRAST:  34m MULTIHANCE GADOBENATE DIMEGLUMINE 529 MG/ML IV SOLN COMPARISON:  CT HEAD February 28th 2018 and CT angiogram head and neck April 04, 2016 FINDINGS: MRI HEAD FINDINGS- Mild motion degraded examination. INTRACRANIAL CONTENTS: No reduced diffusion to suggest acute ischemia nor hyperacute demyelination. No susceptibility artifact to suggest hemorrhage. The ventricles and sulci are normal for patient's age. No suspicious parenchymal signal, masses, mass effect. No abnormal intraparenchymal or extra-axial  enhancement. No abnormal extra-axial fluid collections. No extra-axial masses. VASCULAR: Normal major intracranial vascular flow voids present at skull base. SKULL AND UPPER CERVICAL SPINE: No abnormal sellar expansion. No suspicious calvarial bone marrow signal. Craniocervical junction maintained. OTHER: None. MRI ORBITS FINDINGS- moderately motion degraded examination. ORBITS: Reduced diffusion RIGHT optic nerve within the intra-ocular ocular and anterior intraorbital segments with reduced diffusion, expanded bright T2 signal. Enhancement RIGHT optic nerve from the intraorbital segment through the intracanalicular segment. Ocular globes are intact with normal signal. Lenses are located. Preservation of the orbital fat. Normal symmetric appearance of the extraocular muscles. No intra-ocular mass, signal abnormality nor abnormal enhancement. Superior ophthalmic veins are not enlarged. VISUALIZED SINUSES: Trace paranasal sinus mucosal thickening without air-fluid levels. Mastoid air cells are well aerated. SOFT TISSUES: Normal. 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 However, superimposed reduced diffusion within the optic nerve concerning for ischemia/infarct (potentially secondary to local vasculopathy). MRI brain:  Normal. Acute findings discussed with and reconfirmed by PA WWaynetta Peanon 04/16/2016 at 8:23 pm. Electronically Signed: By: CElon AlasM.D. On: 04/16/2016 20:25     Medications:  Scheduled: . acyclovir  700 mg Intravenous Q8H  . amLODipine  5 mg Oral Daily  . emtricitabine-rilpivir-tenofovir AF  1 tablet Oral Q breakfast  . lidocaine  10 mL Infiltration Once  . lisinopril  40 mg Oral Daily  . methylPREDNISolone (SOLU-MEDROL) injection  500 mg Intravenous Q12H  . vitamin B-12  1,000 mcg Oral Daily   Continuous:  PIWP:[id number]**OR** acetaminophen, HYDROcodone-acetaminophen, ondansetron **OR**  ondansetron (ZOFRAN) IV  Assessment/Plan:  Principal Problem:   Optic neuritis Active Problems:   [place] virus [place]    Optic neuritis Concern for viral given recent diagnosis of shingles. Patient being treated with IV acyclovir. Concern for other infections given patient's lack of HIV therapy at this time. Patient was started on IV doxycycline. Patient seen by neurology. He is status post lumbar puncture. WBC was 39 and 28 in two different tubes. Total protein 54. Glucose 57. Not conclusive for bacterial meningitis. Gram stain negative. Ophthalmology and infectious disease is following. Appreciate neurology input as well. CSF analysis not conclusive for the bacterial meningitis. Doxycycline was discontinued. Patient has also been started on high-dose steroids. Patient does mention improvement in his symptoms. Other workup is pending.  HIV HIV viral load, 69,300 on 3/5. CD4 count 620 on 3/5. He stopped taking his medications in December due to financial and logistical issues. Infectious disease is following. He is back on antiretroviral treatment. He may need assistance in arranging medications as an outpatient. Of note, his father is his only family member who knows about his HIV status. He is married but does not have sexual relations with his wife and she is unaware. He requests that his  conditions not be discussed in front of family members.  Essential hypertension. Continue with amlodipine and lisinopril.  DVT Prophylaxis: SCDs    Code Status: DNR  Family Communication: Discussed with the patient  Disposition Plan: Management as outlined above.    LOS: 2 days   [facility] Pager [phone] 04/18/2016, 8:43 AM  If 7PM-7AM, please contact night-coverage at [email or web address], password [facility]

## 2016-04-18 NOTE — Progress Notes (Signed)
Neurology Progress Note  Subjective: Had LP yesterday, tolerated that procedure well. This morning, however, he reports a bifrontal headache when he sits up, better when he is recumbent. No associated nausea/vomiting/photophobia/phonophobia. He was started on IV Solumedrol yesterday afternoon and this morning notes improvement in the vision of his [name]--he can now see shapes and light but says images are still dark and he cannot see much detail. He denies any new neurologic deficits. He reports that he has not had a bowel movement for a couple of days and says he usually has 2-3 per day. 12-point ROS otherwise unremarkable.  Medications reviewed and reconciled.   Pertinent meds: Acyclovir 700 mg IV q8h Odefsey 1 tablet daily Solumedrol 500 mg IV q12h Vitamin B12 1000 mcg PO daily  Current Meds:   Current Facility-Administered Medications:  .  acetaminophen (TYLENOL) tablet 650 mg, 650 mg, Oral, Q6H PRN **OR** acetaminophen (TYLENOL) suppository 650 mg, 650 mg, Rectal, Q6H PRN, Karmen Bongo, MD .  acyclovir (ZOVIRAX) 700 mg in dextrose 5 % 100 mL IVPB, 700 mg, Intravenous, Q8H, Veronda P Bryk, RPH, 700 mg at 04/18/16 0841 .  amLODipine (NORVASC) tablet 5 mg, 5 mg, Oral, Daily, Karmen Bongo, MD, 5 mg at 04/17/16 1003 .  emtricitabine-rilpivir-tenofovir AF (ODEFSEY) 200-25-25 MG per tablet 1 tablet, 1 tablet, Oral, Q breakfast, Karmen Bongo, MD, 1 tablet at 04/18/16 0841 .  HYDROcodone-acetaminophen (NORCO/VICODIN) 5-325 MG per tablet 1-2 tablet, 1-2 tablet, Oral, Q4H PRN, Karmen Bongo, MD, 2 tablet at 04/18/16 0054 .  lidocaine (XYLOCAINE) 2 % (with pres) injection 200 mg, 10 mL, Infiltration, Once, Darrel Reach, MD .  lisinopril (PRINIVIL,ZESTRIL) tablet 40 mg, 40 mg, Oral, Daily, Karmen Bongo, MD, 40 mg at 04/17/16 1003 .  methylPREDNISolone sodium succinate (SOLU-MEDROL) 500 mg in sodium chloride 0.9 % 50 mL IVPB, 500 mg, Intravenous, Q12H, Darrel Reach, MD, 500 mg at  04/18/16 0841 .  ondansetron (ZOFRAN) tablet 4 mg, 4 mg, Oral, Q6H PRN **OR** ondansetron (ZOFRAN) injection 4 mg, 4 mg, Intravenous, Q6H PRN, Karmen Bongo, MD .  vitamin B-12 (CYANOCOBALAMIN) tablet 1,000 mcg, 1,000 mcg, Oral, Daily, Darrel Reach, MD, 1,000 mcg at 04/17/16 2200  Objective:  Temp:  [98.1 F (36.7 C)-99.1 F (37.3 C)] 98.1 F (36.7 C) (03/10 0544) Pulse Rate:  [58-68] 62 (03/10 0544) Resp:  [18] 18 (03/10 0544) BP: (129-149)/(77-83) 141/81 (03/10 0544) SpO2:  [100 %] 100 % (03/09 2205)  General: WDWN resting comfortably in NAD. Alert, oriented x4. Speech is clear without dysarthria. Affect is bright. Comportment is normal.  HEENT: Neck is supple without lymphadenopathy. Mucous membranes are moist and the oropharynx is clear. Sclerae are anicteric. There is no conjunctival injection.  CV: Regular, no murmur. Carotid pulses are 2+ and symmetric with no bruits. Distal pulses 2+ and symmetric.  Lungs: CTAB  Extremities: No C/C/E. Back: LP site notable for mild superficial bruising, no discharge, no erythema.  Neuro: MS: As noted above.  CN: Pupils are equal and reactive from 3-->2 mm bilaterally. Able to see outlines and light with [name]. EOMI, no nystagmus. Facial sensation is intact to light touch. Face is symmetric at rest with normal strength and mobility. Hearing is intact to conversational voice. Voice is normal in tone and quality. Palate elevates symmetrically. Uvula is midline. Bilateral SCM and trapezii are 5/5. Tongue is midline with normal bulk and mobility.  Motor: Normal bulk, tone, and strength throughout. No pronator drift. No tremor or other abnormal movements are observed.  Sensation: Intact to light touch.  DTRs: 2+, symmetric. Toes are downgoing bilaterally. No pathological reflexes.  Coordination: Finger-to-nose and heel-to-shin are without dysmetria bilaterally.   Labs: Lab Results  Component Value Date   WBC 7.5 04/18/2016   HGB 13.8  04/18/2016   HCT 40.5 04/18/2016   PLT 296 04/18/2016   GLUCOSE 150 (H) 04/18/2016   CHOL 179 02/28/2015   TRIG 186 (H) 02/28/2015   HDL 41 02/28/2015   LDLCALC 101 02/28/2015   ALT 33 04/13/2016   AST 29 04/13/2016   NA 135 04/18/2016   K 4.5 04/18/2016   CL 100 (L) 04/18/2016   CREATININE 1.16 04/18/2016   BUN 12 04/18/2016   CO2 24 04/18/2016   TSH 0.887 04/30/2008   HGBA1C 5.2 01/29/2014   MICROALBUR 1.17 09/07/2013   CBC Latest Ref Rng & Units 04/18/2016 04/17/2016 04/16/2016  WBC 4.0 - 10.5 K/uL 7.5 5.1 6.6  Hemoglobin 13.0 - 17.0 g/dL 13.8 12.1(L) 12.7(L)  Hematocrit 39.0 - 52.0 % 40.5 35.9(L) 37.6(L)  Platelets 150 - 400 K/uL 296 256 281    Lab Results  Component Value Date   HGBA1C 5.2 01/29/2014   Lab Results  Component Value Date   ALT 33 04/13/2016   AST 29 04/13/2016   ALKPHOS 65 04/13/2016   BILITOT 0.4 04/13/2016   Vitamin B12 319 RPR nonreactive  CSF wbc 39-->28, 97% lymphs, 3% monos CSF rbc 12-->27 CSF glucose 57 CSF protein 54 CSF crypto Ag negative CSF gram stain negative  Pending CSF studies: EBV PCR VZV PCR VZV IgG/IgM Fungal cx AFB smear/cx Oligoclonal bands IgG index VDRL Cytology with flow  Pending serum studies: ANA ACE CMV PCR Lyme titers  Radiology:  There is no new neuroimaging for review  A/P:   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 Main differential considerations at this time include VZV and HIV infection/vasculopathy. His CD4 count of 620 essentially excludes opportunistic pathogens and CNS lymphoma 2/2 EBV. Primary demyelinating disease would be less likely. His vitamin B12 is low normal, not at a level that I would expect to produce optic neuropathy, which would usually be bilateral with metabolic causes anyway. Extensive CSF w/u underway as noted above. So far preliminary studies support intrathecal inflammatory process with mild mononuclear pleocytosis and mildly elevated protein. This profile is not consistent  with TB or fungus and thus the patient has been started on high-dose IV Solumedrol, 1000 mg daily. This should be continued for a total of five days--today is day #2. Appreciate ophtho assistance.   2. Headache: He reports a positional headache with pain when head upright but little or no pain when supine. This is consistent with a post-LP headache. Recommend supportive care with bed rest (head no higher than 30 degrees), OK to get up to go to bathroom but otherwise minimize head-up positioning. Push fluid and caffeine intake.   3. HIV infection: Appreciate ID assistance. Continue antiretrovirals.    Fall risk: Risk factors for falls include vision loss in the [name]. Patient aware of deficit and has good insight into risk. Fall precautions. Limit psychoactive medications and sedating medications.  Delirium risk: Risk factors for delirium include HIV infection, high-dose steroids. Monitor. Minimize the use of opiates, benzos or any medication with strong anticholinergic properties as much as possible. Optimize sleep-wake cycles as much as you can by keeping the room bright with activity during the day and dark and quiet at night.    Needs outpatient neurology follow-up?: To be determined.  This was discussed with the patient. No family was present. Education was provided on the diagnosis and expected evaluation and treatment. He is in agreement with the plan as noted. He was given the opportunity to ask any questions and these were addressed to his satisfaction.   Melba Coon, MD [facility]

## 2016-04-19 DIAGNOSIS — G4489 Other headache syndrome: Secondary | ICD-10-CM

## 2016-04-19 DIAGNOSIS — F17211 Nicotine dependence, cigarettes, in remission: Secondary | ICD-10-CM

## 2016-04-19 LAB — HERPES SIMPLEX VIRUS(HSV) DNA BY PCR
HSV 1 DNA: NEGATIVE
HSV 2 DNA: NEGATIVE

## 2016-04-19 LAB — ANGIOTENSIN CONVERTING ENZYME: Angiotensin-Converting Enzyme: 17 U/L (ref 14–82)

## 2016-04-19 NOTE — Progress Notes (Signed)
Neurology Progress Note  Subjective: He reports ongoing improvement in his vision. He is able to make out more detail and colors today but vision still "gray." He is no longer having any eye discomfort but he remains sensitive to light. He continues to have positional headache, worse when upright better when recumbent. 12-point ROS otherwise unremarkable.  Medications reviewed and reconciled.   Pertinent meds: Acyclovir 700 mg IV q8h Odefsey 1 tablet daily Solumedrol 500 mg IV q12h Vitamin B12 1000 mcg PO daily  Current Meds:   Current Facility-Administered Medications:  .  acetaminophen (TYLENOL) tablet 650 mg, 650 mg, Oral, Q6H PRN, 650 mg at 04/18/16 2032 **OR** acetaminophen (TYLENOL) suppository 650 mg, 650 mg, Rectal, Q6H PRN, Karmen Bongo, MD .  acyclovir (ZOVIRAX) 700 mg in dextrose 5 % 100 mL IVPB, 700 mg, Intravenous, Q8H, Veronda P Bryk, RPH, 700 mg at 04/19/16 0832 .  amLODipine (NORVASC) tablet 5 mg, 5 mg, Oral, Daily, Karmen Bongo, MD, 5 mg at 04/18/16 1013 .  emtricitabine-rilpivir-tenofovir AF (ODEFSEY) 200-25-25 MG per tablet 1 tablet, 1 tablet, Oral, Q breakfast, Karmen Bongo, MD, 1 tablet at 04/19/16 0719 .  HYDROcodone-acetaminophen (NORCO/VICODIN) 5-325 MG per tablet 1-2 tablet, 1-2 tablet, Oral, Q4H PRN, Karmen Bongo, MD, 2 tablet at 04/18/16 1338 .  lidocaine (XYLOCAINE) 2 % (with pres) injection 200 mg, 10 mL, Infiltration, Once, Darrel Reach, MD .  lisinopril (PRINIVIL,ZESTRIL) tablet 40 mg, 40 mg, Oral, Daily, Karmen Bongo, MD, 40 mg at 04/18/16 1013 .  methylPREDNISolone sodium succinate (SOLU-MEDROL) 500 mg in sodium chloride 0.9 % 50 mL IVPB, 500 mg, Intravenous, Q12H, Darrel Reach, MD, 500 mg at 04/19/16 0719 .  ondansetron (ZOFRAN) tablet 4 mg, 4 mg, Oral, Q6H PRN **OR** ondansetron (ZOFRAN) injection 4 mg, 4 mg, Intravenous, Q6H PRN, Karmen Bongo, MD .  senna [facility]) tablet 17.2 mg, 2 tablet, Oral, Daily, Bonnielee Haff, MD, 17.2 mg  at 04/18/16 1333 .  vitamin B-12 (CYANOCOBALAMIN) tablet 1,000 mcg, 1,000 mcg, Oral, Daily, Darrel Reach, MD, 1,000 mcg at 04/18/16 1012  Objective:  Temp:  [98.5 F (36.9 C)-98.7 F (37.1 C)] 98.7 F (37.1 C) (03/11 0636) Pulse Rate:  [69-71] 71 (03/11 0636) Resp:  [17-18] 17 (03/11 0636) BP: (145-158)/(86-95) 155/95 (03/11 0636) SpO2:  [99 %-100 %] 99 % (03/11 0636)  General: WDWN resting comfortably in NAD. Alert, oriented x4. Speech is clear without dysarthria. Affect is bright. Comportment is normal.  HEENT: Neck is supple without lymphadenopathy. Mucous membranes are moist and the oropharynx is clear. Sclerae are anicteric. There is no conjunctival injection.  CV: Regular, no murmur. Carotid pulses are 2+ and symmetric with no bruits. Distal pulses 2+ and symmetric.  Lungs: CTAB  Extremities: No C/C/E. Back: LP site notable for mild superficial bruising, no discharge, no erythema.  Neuro: MS: As noted above.  CN: Pupils are equal and reactive from 3-->2 mm bilaterally. Can describe things on his tray and around the room with his right eye today. EOMI, no nystagmus. Facial sensation is intact to light touch. Face is symmetric at rest with normal strength and mobility. Hearing is intact to conversational voice. Voice is normal in tone and quality. Palate elevates symmetrically. Uvula is midline. Bilateral SCM and trapezii are 5/5. Tongue is midline with normal bulk and mobility.  Motor: Normal bulk, tone, and strength throughout. No pronator drift. No tremor or other abnormal movements are observed.  Sensation: Intact to light touch.  DTRs: 2+, symmetric. Toes are downgoing bilaterally. No pathological  reflexes.  Coordination: Finger-to-nose and heel-to-shin are without dysmetria bilaterally.   Labs: Lab Results  Component Value Date   WBC 7.5 04/18/2016   HGB 13.8 04/18/2016   HCT 40.5 04/18/2016   PLT 296 04/18/2016   GLUCOSE 150 (H) 04/18/2016   CHOL 179 02/28/2015    TRIG 186 (H) 02/28/2015   HDL 41 02/28/2015   LDLCALC 101 02/28/2015   ALT 33 04/13/2016   AST 29 04/13/2016   NA 135 04/18/2016   K 4.5 04/18/2016   CL 100 (L) 04/18/2016   CREATININE 1.16 04/18/2016   BUN 12 04/18/2016   CO2 24 04/18/2016   TSH 0.887 04/30/2008   HGBA1C 5.2 01/29/2014   MICROALBUR 1.17 09/07/2013   CBC Latest Ref Rng & Units 04/18/2016 04/17/2016 04/16/2016  WBC 4.0 - 10.5 K/uL 7.5 5.1 6.6  Hemoglobin 13.0 - 17.0 g/dL 13.8 12.1(L) 12.7(L)  Hematocrit 39.0 - 52.0 % 40.5 35.9(L) 37.6(L)  Platelets 150 - 400 K/uL 296 256 281    Lab Results  Component Value Date   HGBA1C 5.2 01/29/2014   Lab Results  Component Value Date   ALT 33 04/13/2016   AST 29 04/13/2016   ALKPHOS 65 04/13/2016   BILITOT 0.4 04/13/2016   Vitamin B12 319 RPR nonreactive ACE 17  CSF wbc 39-->28, 97% lymphs, 3% monos CSF rbc 12-->27 CSF glucose 57 CSF protein 54 CSF crypto Ag negative CSF gram stain negative CSF culture no growth after 2 days CSF VZV IgG elevated at 222 CSF AFB smear negative  CSF HSV PCR negative  Pending CSF studies: EBV PCR VZV PCR VZV IgM Fungal cx AFB cx Oligoclonal bands IgG index VDRL Cytology with flow  Pending serum studies: ANA CMV PCR Lyme titers  Radiology:  There is no new neuroimaging for review  A/P:   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 This has improved significantly over the past two days. Suspect possible VZV vs HIV infection/vasculopathy. Not likely opportunistic infection or lymphoma given normal CD4. Primary demyelinating disease would be less likely. Low normal vitamin B12 would not be a likely cause but will replete anyway. CSF VZV IgG is elevated but still waiting for IgM and PCR; continue acyclovir. Continue Solumedrol 1000 mg IV, today is day #3/5. Appreciate ophtho assistance.   2. Headache: He still has headache when he sits and stands. Continue supportive care with bed rest (head no higher than 30 degrees), fluids,  caffeine. May need to consider blood patch if persists.   3. HIV infection: Appreciate ID assistance. Continue antiretrovirals.    Fall risk: Risk factors for falls include vision loss in the [name]. Patient aware of deficit and has good insight into risk. Fall precautions. Limit psychoactive medications and sedating medications.  Delirium risk: Risk factors for delirium include HIV infection, high-dose steroids. Monitor. Minimize the use of opiates, benzos or any medication with strong anticholinergic properties as much as possible. Optimize sleep-wake cycles as much as you can by keeping the room bright with activity during the day and dark and quiet at night.    Needs outpatient neurology follow-up?: To be determined.   This was discussed with the patient. No family was present. Education was provided on the diagnosis and expected evaluation and treatment. He is in agreement with the plan as noted. He was given the opportunity to ask any questions and these were addressed to his satisfaction.   Melba Coon, MD [facility]

## 2016-04-19 NOTE — Progress Notes (Signed)
INFECTIOUS DISEASE PROGRESS NOTE  ID: JERAN HILTZ is a 51 y.o. male with  Principal Problem:   Optic neuritis Active Problems:   HIV (human immunodeficiency virus infection) ([place])   Other headache syndrome  Subjective: Vision improving, can see TV Has been fine without cigarettes.   Abtx:  Anti-infectives    Start     Dose/Rate Route Frequency Ordered Stop   04/17/16 1000  doxycycline (VIBRAMYCIN) 100 mg in dextrose 5 % 250 mL IVPB  Status:  Discontinued     100 mg 125 mL/hr over 120 Minutes Intravenous Every 12 hours 04/17/16 0207 04/17/16 1510   04/17/16 0800  emtricitabine-rilpivir-tenofovir AF (ODEFSEY) 200-25-25 MG per tablet 1 tablet     1 tablet Oral Daily with breakfast 04/17/16 0207     04/17/16 0800  acyclovir (ZOVIRAX) 700 mg in dextrose 5 % 100 mL IVPB     700 mg 114 mL/hr over 60 Minutes Intravenous Every 8 hours 04/17/16 0143     04/16/16 2130  acyclovir (ZOVIRAX) 675 mg in dextrose 5 % 100 mL IVPB     10 mg/kg  67.6 kg 113.5 mL/hr over 60 Minutes Intravenous  Once 04/16/16 2057 04/17/16 0144   04/16/16 2100  doxycycline (VIBRAMYCIN) 100 mg in dextrose 5 % 250 mL IVPB     100 mg 125 mL/hr over 120 Minutes Intravenous  Once 04/16/16 2057 04/17/16 0144      Medications:  Scheduled: . acyclovir  700 mg Intravenous Q8H  . amLODipine  5 mg Oral Daily  . emtricitabine-rilpivir-tenofovir AF  1 tablet Oral Q breakfast  . lidocaine  10 mL Infiltration Once  . lisinopril  40 mg Oral Daily  . methylPREDNISolone (SOLU-MEDROL) injection  500 mg Intravenous Q12H  . senna  2 tablet Oral Daily  . vitamin B-12  1,000 mcg Oral Daily    Objective: Vital signs in last 24 hours: Temp:  [98.6 F (37 C)-98.7 F (37.1 C)] 98.7 F (37.1 C) (03/11 0636) Pulse Rate:  [69-71] 71 (03/11 0636) Resp:  [17] 17 (03/11 0636) BP: (145-155)/(86-95) 155/95 (03/11 0636) SpO2:  [99 %-100 %] 99 % (03/11 0636)   General appearance: alert, cooperative and no distress Resp: clear  to auscultation bilaterally Cardio: regular rate and rhythm GI: normal findings: bowel sounds normal and soft, non-tender  Lab Results  Recent Labs  04/17/16 0841 04/18/16 0536  WBC 5.1 7.5  HGB 12.1* 13.8  HCT 35.9* 40.5  NA 138 135  K 3.4* 4.5  CL 104 100*  CO2 26 24  BUN 9 12  CREATININE 1.03 1.16   Liver Panel No results for input(s): PROT, ALBUMIN, AST, ALT, ALKPHOS, BILITOT, BILIDIR, IBILI in the last 72 hours. Sedimentation Rate No results for input(s): ESRSEDRATE in the last 72 hours. C-Reactive Protein No results for input(s): CRP in the last 72 hours.  Microbiology: Recent Results (from the past 240 hour(s))  Culture, blood (routine x 2)     Status: None (Preliminary result)   Collection Time: 04/16/16 11:34 PM  Result Value Ref Range Status   Specimen Description BLOOD RIGHT ARM  Final   Special Requests AEROBIC BOTTLE ONLY 7ML  Final   Culture NO GROWTH 1 DAY  Final   Report Status PENDING  Incomplete  Culture, blood (routine x 2)     Status: None (Preliminary result)   Collection Time: 04/16/16 11:50 PM  Result Value Ref Range Status   Specimen Description BLOOD LEFT HAND  Final   Special  Requests AEROBIC BOTTLE ONLY 5ML  Final   Culture NO GROWTH 1 DAY  Final   Report Status PENDING  Incomplete  CSF culture with Stat gram stain     Status: None (Preliminary result)   Collection Time: 04/17/16 10:00 AM  Result Value Ref Range Status   Specimen Description CSF  TUBE 2  Final   Special Requests Immunocompromised  Final   Gram Stain NO WBC SEEN NO ORGANISMS SEEN   Final   Culture NO GROWTH 2 DAYS  Final   Report Status PENDING  Incomplete  Acid Fast Smear (AFB)     Status: None   Collection Time: 04/17/16 10:00 AM  Result Value Ref Range Status   AFB Specimen Processing Concentration  Final   Acid Fast Smear Negative  Final    Comment: (NOTE) Performed At: [facility] [place], [place] [id number] Lindon Romp MD  BR:[id number]    Source (AFB) CSF  Final    Comment:  TUBE 2    Studies/Results: No results found.   Assessment/Plan: Optic Neuritis Slow, continued, improvement Contnue steroids.  His CSF shows protein and an elevated WBC as well as many RBC.  Lyme is not endemic to this area, would not suspect unless he has spent time outside [place] during lyme season.  His RPR is (-). Await CSF VDRL. His Varicella IgG suggests immunity, needs IgM Continue Acyclovir while awaiting VZV on CSF. HSV is (-) Pending- ANA, ACE, CSF CMV/VZV PCR. Borellia, Bartonella. CSF oligoclonal bands.    HIV With CD4 of 620 last week, I am doubtful that this is an OI.  continue odefsy  Tobacco cessation Nicotine replacement as needed.  Encourage cessation.  My great appreciation to Neuro, Ophtho and Dr Lyman Speller excellent care.   Steroids Day 2 Total days of antibiotics: 3 acyclovir          Bobby Rumpf Infectious Diseases (pager) [phone] [email or web address] 04/19/2016, 2:05 PM  LOS: 3 days

## 2016-04-19 NOTE — Progress Notes (Signed)
[facility] PROGRESS NOTE  Ryan Cruz HYW:[id number] DOB: [birth year] DOA: 04/16/2016  PCP: Bobby Rumpf, MD  Brief History/Interval Summary: 51 year old male with a past medical history of HIV infection, not being treated since December 2017, history of anxiety and depression, hypertension, presented with loss of vision in his right eye. Patient woke up on 2/24 and he felt like someone had hit him in the head with a hammer, drove a stake through his head. Came to ER, treated for migraine, normal ESR. Pain was unrelenting, came back on 2/28. Diagnosed with shingles. Went to eye doctor and he immediately sent to the ER for MRI. Vision problems started on the 24th, worse on the 28th, by 3/7 he was unable to see out of his right eye. MRI raised concern for optic neuritis. Patient was hospitalized for further management.  Reason for Visit: Optic neuritis  Consultants: Neurology. Infectious disease. Ophthalmology  Procedures: Lumbar puncture 3/9  Antibiotics: IV acyclovir Doxycycline stopped on 3/9.  Subjective/Interval History: Patient states that his vision continues to improve. Denies any pain in his right eye, however, does have positional headache.   ROS: Denies any nausea or vomiting  Objective:  Vital Signs  Vitals:   04/18/16 0544 04/18/16 1324 04/18/16 2034 04/19/16 0636  BP: (!) 141/81 (!) 158/93 (!) 145/86 (!) 155/95  Pulse: 62 70 69 71  Resp: '18 18 17 17  '$ Temp: 98.1 F (36.7 C) 98.5 F (36.9 C) 98.6 F (37 C) 98.7 F (37.1 C)  TempSrc: Oral Oral Oral Oral  SpO2:  100% 100% 99%  Weight:      Height:        Intake/Output Summary (Last 24 hours) at 04/19/16 1003 Last data filed at 04/19/16 4854  Gross per 24 hour  Intake             1024 ml  Output                0 ml  Net             1024 ml   Filed Weights   04/17/16 0208  Weight: 65.3 kg (143 lb 15.4 oz)    General appearance: alert, cooperative, appears stated age and no distress Resp: clear  to auscultation bilaterally Cardio: regular rate and rhythm, S1, S2 normal, no murmur, click, rub or gallop GI: soft, non-tender; bowel sounds normal; no masses,  no organomegaly Neurologic: No focal neurological deficits noted.  Lab Results:  Data Reviewed: I have personally reviewed following labs and imaging studies  CBC:  Recent Labs Lab 04/13/16 1532 04/16/16 1825 04/17/16 0841 04/18/16 0536  WBC 6.3 6.6 5.1 7.5  NEUTROABS  --  2.8  --   --   HGB 12.6* 12.7* 12.1* 13.8  HCT 37.3* 37.6* 35.9* 40.5  MCV 93.3 92.2 91.8 91.6  PLT 281 281 256 627    Basic Metabolic Panel:  Recent Labs Lab 04/13/16 1532 04/16/16 1825 04/17/16 0841 04/18/16 0536  NA 140 136 138 135  K 3.8 3.6 3.4* 4.5  CL 106 104 104 100*  CO2 '27 24 26 24  '$ GLUCOSE 86 103* 117* 150*  BUN '11 12 9 12  '$ CREATININE 1.22 1.10 1.03 1.16  CALCIUM 9.3 9.0 9.0 9.7    GFR: Estimated Creatinine Clearance: 71.9 mL/min (by C-G formula based on SCr of 1.16 mg/dL).  Liver Function Tests:  Recent Labs Lab 04/13/16 1532  AST 29  ALT 33  ALKPHOS 65  BILITOT 0.4  PROT 7.3  ALBUMIN 4.1    Anemia Panel:  Recent Labs  04/17/16 1041  VITAMINB12 319    Recent Results (from the past 240 hour(s))  Culture, blood (routine x 2)     Status: None (Preliminary result)   Collection Time: 04/16/16 11:34 PM  Result Value Ref Range Status   Specimen Description BLOOD RIGHT ARM  Final   Special Requests AEROBIC BOTTLE ONLY 7ML  Final   Culture NO GROWTH 1 DAY  Final   Report Status PENDING  Incomplete  Culture, blood (routine x 2)     Status: None (Preliminary result)   Collection Time: 04/16/16 11:50 PM  Result Value Ref Range Status   Specimen Description BLOOD LEFT HAND  Final   Special Requests AEROBIC BOTTLE ONLY 5ML  Final   Culture NO GROWTH 1 DAY  Final   Report Status PENDING  Incomplete  CSF culture with Stat gram stain     Status: None (Preliminary result)   Collection Time: 04/17/16 10:00 AM    Result Value Ref Range Status   Specimen Description CSF  TUBE 2  Final   Special Requests Immunocompromised  Final   Gram Stain NO WBC SEEN NO ORGANISMS SEEN   Final   Culture NO GROWTH 2 DAYS  Final   Report Status PENDING  Incomplete  Acid Fast Smear (AFB)     Status: None   Collection Time: 04/17/16 10:00 AM  Result Value Ref Range Status   AFB Specimen Processing Concentration  Final   Acid Fast Smear Negative  Final    Comment: (NOTE) Performed At: [facility] [place], [place] [id number] Lindon Romp MD QM:[id number]    Source (AFB) CSF  Final    Comment:  TUBE 2      Radiology Studies: No results found.   Medications:  Scheduled: . acyclovir  700 mg Intravenous Q8H  . amLODipine  5 mg Oral Daily  . emtricitabine-rilpivir-tenofovir AF  1 tablet Oral Q breakfast  . lidocaine  10 mL Infiltration Once  . lisinopril  40 mg Oral Daily  . methylPREDNISolone (SOLU-MEDROL) injection  500 mg Intravenous Q12H  . senna  2 tablet Oral Daily  . vitamin B-12  1,000 mcg Oral Daily   Continuous:  MWU:[id number] **OR** acetaminophen, HYDROcodone-acetaminophen, ondansetron **OR** ondansetron (ZOFRAN) IV  Assessment/Plan:  Principal Problem:   Optic neuritis Active Problems:   HIV (human immunodeficiency virus infection) ([place])   Other headache syndrome    Optic neuritis Concern for viral given recent diagnosis of shingles. Patient being treated with IV acyclovir. Concern for other infections given patient's lack of HIV therapy at this time. Patient was started on IV doxycycline. Patient seen by neurology. He is status post lumbar puncture. WBC was 39 and 28 in two different tubes. Total protein 54. Glucose 57. Not conclusive for bacterial meningitis. Gram stain negative. Ophthalmology and infectious disease is following. Appreciate neurology input as well. CSF analysis not conclusive for the bacterial meningitis. Doxycycline was discontinued.  Patient has also been started on high-dose steroids. Patient has had improvement in his vision. Other workup is pending.  HIV HIV viral load, 69,300 on 3/5. CD4 count 620 on 3/5. He stopped taking his medications in December due to financial and logistical issues. Infectious disease is following. He is back on antiretroviral treatment. He may need assistance in arranging medications as an outpatient. Of note, his father is his only family member who knows about his HIV status. He is married but does  not have sexual relations with his wife and she is unaware. He requests that his conditions not be discussed in front of family members.  Essential hypertension. Continue with amlodipine and lisinopril. Blood pressure is stable, though not optimally controlled yet. Pain is also contributing to elevated blood pressures.  DVT Prophylaxis: SCDs    Code Status: DNR  Family Communication: Discussed with the patient  Disposition Plan: Management as outlined above. Mobilize as tolerated.    LOS: 3 days   [facility] Pager [phone] 04/19/2016, 10:03 AM  If 7PM-7AM, please contact night-coverage at [email or web address], password [facility]

## 2016-04-20 DIAGNOSIS — H4601 Optic papillitis, right eye: Secondary | ICD-10-CM

## 2016-04-20 LAB — IGG CSF INDEX
ALBUMIN: 4.2 g/dL (ref 3.5–5.5)
Albumin CSF-mCnc: 32 mg/dL (ref 11–48)
CSF IgG Index: 1 — ABNORMAL HIGH (ref 0.0–0.7)
IGG (IMMUNOGLOBIN G), SERUM: 1630 mg/dL — AB (ref 700–1600)
IGG CSF: 12.3 mg/dL — AB (ref 0.0–8.6)
IGG/ALB RATIO, CSF: 0.38 — AB (ref 0.00–0.25)

## 2016-04-20 LAB — CSF CULTURE W GRAM STAIN
Culture: NO GROWTH
Gram Stain: NONE SEEN

## 2016-04-20 LAB — BARTONELLA ANTIBODY PANEL
B Quintana IgM: NEGATIVE titer
B henselae IgM: NEGATIVE titer
B quintana IgG: NEGATIVE titer

## 2016-04-20 LAB — CSF CULTURE

## 2016-04-20 LAB — BARTONELLA ANITBODY PANEL: B HENSELAE IGG: NEGATIVE {titer}

## 2016-04-20 LAB — ANTINUCLEAR ANTIBODIES, IFA: ANTINUCLEAR ANTIBODIES, IFA: NEGATIVE

## 2016-04-20 MED ORDER — ALPRAZOLAM 0.5 MG PO TABS
0.5000 mg | ORAL_TABLET | Freq: Once | ORAL | Status: AC
  Administered 2016-04-20: 0.5 mg via ORAL
  Filled 2016-04-20: qty 1

## 2016-04-20 NOTE — Progress Notes (Signed)
Patient has no IV access at this time, MD made aware.

## 2016-04-20 NOTE — Progress Notes (Addendum)
[facility] PROGRESS NOTE  Ryan Cruz KHT:[id number] DOB: Sep 11, 1965 DOA: 04/16/2016  PCP: Ryan Rumpf, MD  Brief History/Interval Summary: 51 year old male with a past medical history of HIV infection, not being treated since December 2017, history of anxiety and depression, hypertension, presented with loss of vision in his right eye. Patient woke up on 2/24 and he felt like someone had hit him in the head with a hammer, drove a stake through his head. Came to ER, treated for migraine, normal ESR. Pain was unrelenting, came back on 2/28. Diagnosed with shingles. Went to eye doctor and he immediately sent to the ER for MRI. Vision problems started on the 24th, worse on the 28th, by 3/7 he was unable to see out of his right eye. MRI raised concern for optic neuritis. Patient was hospitalized for further management.  Reason for Visit: Optic neuritis  Consultants: Neurology. Infectious disease. Ophthalmology  Procedures: Lumbar puncture 3/9  Antibiotics: IV acyclovir Doxycycline stopped on 3/9.  Subjective/Interval History: Patient's vision continues to improve. Pain has improved as well in his right eye. Headaches are still present but better.   ROS: Denies any nausea or vomiting  Objective:  Vital Signs  Vitals:   04/19/16 1441 04/19/16 1739 04/19/16 2130 04/20/16 0618  BP: (!) 155/93 (!) 123/53 (!) 145/76 (!) 148/87  Pulse: 70 (!) 18 80 70  Resp: '18 18 17 17  '$ Temp: 98.4 F (36.9 C) 98.5 F (36.9 C) 98.7 F (37.1 C) 98.4 F (36.9 C)  TempSrc: Oral Oral Oral Oral  SpO2: 100% 98% 100% 100%  Weight:      Height:        Intake/Output Summary (Last 24 hours) at 04/20/16 5320 Last data filed at 04/20/16 2334  Gross per 24 hour  Intake              702 ml  Output                0 ml  Net              702 ml   Filed Weights   04/17/16 0208  Weight: 65.3 kg (143 lb 15.4 oz)    General appearance: alert, cooperative, appears stated age and no distress Resp:  clear to auscultation bilaterally Cardio: regular rate and rhythm, S1, S2 normal, no murmur, click, rub or gallop GI: soft, non-tender; bowel sounds normal; no masses,  no organomegaly Neurologic: No focal neurological deficits noted.  Lab Results:  Data Reviewed: I have personally reviewed following labs and imaging studies  CBC:  Recent Labs Lab 04/13/16 1532 04/16/16 1825 04/17/16 0841 04/18/16 0536  WBC 6.3 6.6 5.1 7.5  NEUTROABS  --  2.8  --   --   HGB 12.6* 12.7* 12.1* 13.8  HCT 37.3* 37.6* 35.9* 40.5  MCV 93.3 92.2 91.8 91.6  PLT 281 281 256 356    Basic Metabolic Panel:  Recent Labs Lab 04/13/16 1532 04/16/16 1825 04/17/16 0841 04/18/16 0536  NA 140 136 138 135  K 3.8 3.6 3.4* 4.5  CL 106 104 104 100*  CO2 '27 24 26 24  '$ GLUCOSE 86 103* 117* 150*  BUN '11 12 9 12  '$ CREATININE 1.22 1.10 1.03 1.16  CALCIUM 9.3 9.0 9.0 9.7    GFR: Estimated Creatinine Clearance: 71.9 mL/min (by C-G formula based on SCr of 1.16 mg/dL).  Liver Function Tests:  Recent Labs Lab 04/13/16 1532  AST 29  ALT 33  ALKPHOS 65  BILITOT 0.4  PROT 7.3  ALBUMIN 4.1    Anemia Panel:  Recent Labs  04/17/16 1041  VITAMINB12 319    Recent Results (from the past 240 hour(s))  Culture, blood (routine x 2)     Status: None (Preliminary result)   Collection Time: 04/16/16 11:34 PM  Result Value Ref Range Status   Specimen Description BLOOD RIGHT ARM  Final   Special Requests AEROBIC BOTTLE ONLY  Final   Culture NO GROWTH 2 DAYS  Final   Report Status PENDING  Incomplete  Culture, blood (routine x 2)     Status: None (Preliminary result)   Collection Time: 04/16/16 11:50 PM  Result Value Ref Range Status   Specimen Description BLOOD LEFT HAND  Final   Special Requests AEROBIC BOTTLE ONLY  Final   Culture NO GROWTH 2 DAYS  Final   Report Status PENDING  Incomplete  CSF culture with Stat gram stain     Status: None (Preliminary result)   Collection Time: 04/17/16  10:00 AM  Result Value Ref Range Status   Specimen Description CSF  TUBE 2  Final   Special Requests Immunocompromised  Final   Gram Stain NO WBC SEEN NO ORGANISMS SEEN   Final   Culture NO GROWTH 2 DAYS  Final   Report Status PENDING  Incomplete  Acid Fast Smear (AFB)     Status: None   Collection Time: 04/17/16 10:00 AM  Result Value Ref Range Status   AFB Specimen Processing Concentration  Final   Acid Fast Smear Negative  Final    Comment: (NOTE) Performed At: [facility] [address] [place], [place] [id number] Mila Homer MD WB:[id number]    Source (AFB) CSF  Final    Comment:  TUBE 2      Radiology Studies: No results found.   Medications:  Scheduled: . acyclovir  700 mg Intravenous Q8H  . amLODipine  5 mg Oral Daily  . emtricitabine-rilpivir-tenofovir AF  1 tablet Oral Q breakfast  . lidocaine  10 mL Infiltration Once  . lisinopril  40 mg Oral Daily  . methylPREDNISolone (SOLU-MEDROL) injection  500 mg Intravenous Q12H  . senna  2 tablet Oral Daily  . vitamin B-12  1,000 mcg Oral Daily   Continuous:  AIF:[id number] **OR** acetaminophen, HYDROcodone-acetaminophen, ondansetron **OR** ondansetron (ZOFRAN) IV  Assessment/Plan:  Principal Problem:   Optic neuritis Active Problems:   HIV (human immunodeficiency virus infection) (H[facility])   Other headache syndrome    Optic neuritis Concern for viral given recent diagnosis of shingles. Patient being treated with IV acyclovir. HSV was negative. Waiting on VZV PCR. Patient was also started on IV doxycycline. Patient seen by neurology. He is status post lumbar puncture. WBC was 39 and 28 in two different tubes. Total protein 54. Glucose 57. Not conclusive for bacterial meningitis. Gram stain negative. Ophthalmology and infectious disease is following. Appreciate neurology input as well. CSF analysis not conclusive for the bacterial meningitis. Doxycycline was discontinued. Patient has also been  started on high-dose steroids. Patient has had improvement in his vision.   HIV HIV viral load, 69,300 on 3/5. CD4 count 620 on 3/5. He stopped taking his medications in December due to financial and logistical issues. Infectious disease is following. He is back on antiretroviral treatment. He may need assistance in arranging medications as an outpatient. Of note, his father is his only family member who knows about his HIV status. He is married but does not have sexual relations with his  wife and she is unaware. He requests that his conditions not be discussed in front of family members.  Essential hypertension. Continue with amlodipine and lisinopril. Blood pressure is stable, though not optimally controlled yet.   Addendum 6:34 PM: RN called me that patient refusing IV access due to pain issue. RN instructed to tell patient importance of having IV access as he will not be able to get important medications. RN instructed to have IV team attempt again. Midline will be ordered.   DVT Prophylaxis: SCDs    Code Status: DNR  Family Communication: Discussed with the patient  Disposition Plan: Management as outlined above. Mobilize as tolerated.    LOS: 4 days   [facility] Pager [phone] 04/20/2016, 9:16 AM  If 7PM-7AM, please contact night-coverage at [email or web address], password [facility]

## 2016-04-20 NOTE — Progress Notes (Signed)
Pharmacy Anti-infective Consult  Ryan Cruz is a 51 y.o. male admitted on 04/16/2016 with shingles that has been affecting his vision.  Patient has HIV and has been off antivirals since December.  Pharmacy has been consulted for acyclovir dosing.  His renal function is stable.  Plan: Continue Acyclovir 700mg  IV Q8H (~10 mg/kg/dose) Monitor renal fxn, clinical improvement F/U VZV PCR   Temp (24hrs), Avg:98.5 F (36.9 C), Min:98.4 F (36.9 C), Max:98.7 F (37.1 C)   Recent Labs Lab 04/13/16 1532 04/16/16 1825 04/17/16 0841 04/18/16 0536  WBC 6.3 6.6 5.1 7.5  CREATININE 1.22 1.10 1.03 1.16    Estimated Creatinine Clearance: 71.9 mL/min (by C-G formula based on SCr of 1.16 mg/dL).    No Known Allergies  Acyclovir 3/8 >> Doxy 3/8 >> 3/9  3/8 BCx x2 - NGTD 3/9 CSF - negative    Ryan Cruz, PharmD, BCPS Pager:  [phone] 04/20/2016, 2:22 PM

## 2016-04-20 NOTE — Progress Notes (Signed)
Pt anxious about PICC/ Midline.  Refusing PICC.  Midline not desirable due to  acyclovir  PH of 11.  Discuss venous access options.  Both arm assessed with ultrasound.  PIV 20ga placed with ultrasound Left upper arm.

## 2016-04-20 NOTE — Progress Notes (Signed)
    [place] for Infectious Disease   Reason for visit: Follow up on optic neuritis  Interval History: some improvement in his vision, no new complaints, tolerating medication.  No associated n/v/d.   Physical Exam: Constitutional:  Vitals:   04/19/16 2130 04/20/16 0618  BP: (!) 145/76 (!) 148/87  Pulse: 80 70  Resp: 17 17  Temp: 98.7 F (37.1 C) 98.4 F (36.9 C)   patient appears in NAD Respiratory: Normal respiratory effort; CTA B Cardiovascular: RRR GI: soft, nt, nd  Review of Systems: Integument/breast: negative for rash Musculoskeletal: negative for myalgias and arthralgias  Lab Results  Component Value Date   WBC 7.5 04/18/2016   HGB 13.8 04/18/2016   HCT 40.5 04/18/2016   MCV 91.6 04/18/2016   PLT 296 04/18/2016    Lab Results  Component Value Date   CREATININE 1.16 04/18/2016   BUN 12 04/18/2016   NA 135 04/18/2016   K 4.5 04/18/2016   CL 100 (L) 04/18/2016   CO2 24 04/18/2016    Lab Results  Component Value Date   ALT 33 04/13/2016   AST 29 04/13/2016   ALKPHOS 65 04/13/2016     Microbiology: Recent Results (from the past 240 hour(s))  Culture, blood (routine x 2)     Status: None (Preliminary result)   Collection Time: 04/16/16 11:34 PM  Result Value Ref Range Status   Specimen Description BLOOD RIGHT ARM  Final   Special Requests AEROBIC BOTTLE ONLY 7ML  Final   Culture NO GROWTH 2 DAYS  Final   Report Status PENDING  Incomplete  Culture, blood (routine x 2)     Status: None (Preliminary result)   Collection Time: 04/16/16 11:50 PM  Result Value Ref Range Status   Specimen Description BLOOD LEFT HAND  Final   Special Requests AEROBIC BOTTLE ONLY 5ML  Final   Culture NO GROWTH 2 DAYS  Final   Report Status PENDING  Incomplete  CSF culture with Stat gram stain     Status: None   Collection Time: 04/17/16 10:00 AM  Result Value Ref Range Status   Specimen Description CSF  TUBE 2  Final   Special Requests Immunocompromised  Final   Gram Stain NO WBC SEEN NO ORGANISMS SEEN   Final   Culture NO GROWTH 3 DAYS  Final   Report Status 04/20/2016 FINAL  Final  Acid Fast Smear (AFB)     Status: None   Collection Time: 04/17/16 10:00 AM  Result Value Ref Range Status   AFB Specimen Processing Concentration  Final   Acid Fast Smear Negative  Final    Comment: (NOTE) Performed At: [facility] [place], [place] [id number] Lindon Romp MD WE:[id number]    Source (AFB) CSF  Final    Comment:  TUBE 2    Impression/Plan:  1. Right optic neuropathy - improving since starting steroids and acyclovir.  VZV PCR pending.  Continue acyclovir, steroids.    2.  HIV - back on Odefsey and has drug assistance back in place.  Will need to recheck labs in 3-4 weeks  3.  Headache - from #1 and positional.  On bed rest.  # if related to LP.

## 2016-04-21 LAB — CBC
HCT: 38 % — ABNORMAL LOW (ref 39.0–52.0)
Hemoglobin: 12.8 g/dL — ABNORMAL LOW (ref 13.0–17.0)
MCH: 31.1 pg (ref 26.0–34.0)
MCHC: 33.7 g/dL (ref 30.0–36.0)
MCV: 92.5 fL (ref 78.0–100.0)
PLATELETS: 260 10*3/uL (ref 150–400)
RBC: 4.11 MIL/uL — ABNORMAL LOW (ref 4.22–5.81)
RDW: 13.5 % (ref 11.5–15.5)
WBC: 14.3 10*3/uL — ABNORMAL HIGH (ref 4.0–10.5)

## 2016-04-21 LAB — HIV-1 GENOTYPR PLUS

## 2016-04-21 LAB — BASIC METABOLIC PANEL
Anion gap: 7 (ref 5–15)
BUN: 17 mg/dL (ref 6–20)
CALCIUM: 9.3 mg/dL (ref 8.9–10.3)
CHLORIDE: 102 mmol/L (ref 101–111)
CO2: 28 mmol/L (ref 22–32)
CREATININE: 1.05 mg/dL (ref 0.61–1.24)
Glucose, Bld: 139 mg/dL — ABNORMAL HIGH (ref 65–99)
Potassium: 3.9 mmol/L (ref 3.5–5.1)
SODIUM: 137 mmol/L (ref 135–145)

## 2016-04-21 LAB — CMV DNA, QUANTITATIVE, PCR
CMV DNA Quant: NEGATIVE IU/mL
Log10 CMV Qn DNA Pl: UNDETERMINED log10 IU/mL

## 2016-04-21 LAB — VARICELLA ZOSTER ANTIBODY, IGM

## 2016-04-21 LAB — EPSTEIN BARR VRS(EBV DNA BY PCR)
EBV DNA QN by PCR: NEGATIVE copies/mL
LOG10 EBV DNA QN PCR: UNDETERMINED {Log_copies}/mL

## 2016-04-21 LAB — VARICELLA-ZOSTER BY PCR: VARICELLA-ZOSTER, PCR: NEGATIVE

## 2016-04-21 LAB — B. BURGDORFI ANTIBODIES

## 2016-04-21 MED ORDER — ALPRAZOLAM 0.5 MG PO TABS
0.5000 mg | ORAL_TABLET | Freq: Two times a day (BID) | ORAL | Status: DC | PRN
  Administered 2016-04-21 – 2016-04-22 (×2): 0.5 mg via ORAL
  Filled 2016-04-21 (×2): qty 1

## 2016-04-21 NOTE — Progress Notes (Addendum)
Subjective:  Complaints tolerating medication well. States that his vision is slightly improved Exam: Vitals:   04/20/16 1447 04/21/16 0538  BP: (!) 147/89 (!) 145/63  Pulse: 68 (!) 57  Resp: 17 18  Temp: 98.2 F (36.8 C) 98.9 F (37.2 C)      Neuro:  CN: Pupils are equal and round. They are symmetrically reactive from 3-->2 mm. Unable to name a pen, watch, clock and other things around the room with his right eye. EOMI without nystagmus. Facial sensation is intact to light touch. Face is symmetric at rest with normal strength and mobility. Hearing is intact to conversational voice. Palate elevates symmetrically and uvula is midline. Voice is normal in tone, pitch and quality. Bilateral SCM and trapezii are 5/5. Tongue is midline with normal bulk and mobility.  Motor: Normal bulk, tone, and strength. 5/5 throughout. No drift.  Sensation: Intact to light touch.  DTRs: 2+, symmetric  Toes downgoing bilaterally. No pathologic reflexes.  Coordination: Finger-to-nose and heel-to-shin are without dysmetria     Pertinent Labs/Diagnostics:  Vitamin B12 319 RPR nonreactive ACE 17  CSF wbc 39-->28, 97% lymphs, 3% monos CSF rbc 12-->27 CSF glucose 57 CSF protein 54 CSF crypto Ag --negative CSF gram stain --negative CSF culture no growth after 2 days CSF VZV IgG elevated at 222 CSF AFB smear negative  CSF HSV PCR negative  Pending CSF studies: EBV PCR--negative VZV PCR--negativ VZV IgM--pending Fungal cx--pending AFB cx--negative Oligoclonal bands--pending IgG index--12.3 VDRL--pending Cytology with flow  Pending serum studies: ANA--negative CMV PCR--pending Lyme titers--pending    Impression/plan:  Right optic neuropathy: This is acute. This has improved significantly over the past days. Suspect possible VZV vs HIV infection/vasculopathy. Not likely opportunistic infection or lymphoma given normal CD4. Primary demyelinating disease would be less likely.CSF VZV IgG  is elevated but still waiting for IgM and PCR; continue acyclovir. We'll finish Solumedrol 1000 mg IV today.  At this point time neurology will sign off. Recommend patient following up with both ophthalmology and also neurology as an outpatient after finishing up acyclovir.  Addendum: VZV PCR negative and may be taken of the Acyclovir.  Also will need steroid taper of prednisone 60 mg decreasing 10 mg daily until off.    Etta Quill PA-C [facility] [phone]   04/21/2016, 10:04 AM

## 2016-04-21 NOTE — Progress Notes (Addendum)
[place] for Infectious Disease   Reason for visit: Follow up on optic neuritis  Interval History: continued improvement of vision, VZV negative by PCR.  No new complaints.    Physical Exam: Constitutional:  Vitals:   04/20/16 1447 04/21/16 0538  BP: (!) 147/89 (!) 145/63  Pulse: 68 (!) 57  Resp: 17 18  Temp: 98.2 F (36.8 C) 98.9 F (37.2 C)   patient appears in NAD Respiratory: Normal respiratory effort; CTA B Cardiovascular: RRR GI: soft, nt, nd  Review of Systems: Integument/breast: negative for rash Musculoskeletal: negative for myalgias and arthralgias  Lab Results  Component Value Date   WBC 14.3 (H) 04/21/2016   HGB 12.8 (L) 04/21/2016   HCT 38.0 (L) 04/21/2016   MCV 92.5 04/21/2016   PLT 260 04/21/2016    Lab Results  Component Value Date   CREATININE 1.05 04/21/2016   BUN 17 04/21/2016   NA 137 04/21/2016   K 3.9 04/21/2016   CL 102 04/21/2016   CO2 28 04/21/2016    Lab Results  Component Value Date   ALT 33 04/13/2016   AST 29 04/13/2016   ALKPHOS 65 04/13/2016     Microbiology: Recent Results (from the past 240 hour(s))  Culture, blood (routine x 2)     Status: None (Preliminary result)   Collection Time: 04/16/16 11:34 PM  Result Value Ref Range Status   Specimen Description BLOOD RIGHT ARM  Final   Special Requests AEROBIC BOTTLE ONLY 7ML  Final   Culture NO GROWTH 3 DAYS  Final   Report Status PENDING  Incomplete  Culture, blood (routine x 2)     Status: None (Preliminary result)   Collection Time: 04/16/16 11:50 PM  Result Value Ref Range Status   Specimen Description BLOOD LEFT HAND  Final   Special Requests AEROBIC BOTTLE ONLY 5ML  Final   Culture NO GROWTH 3 DAYS  Final   Report Status PENDING  Incomplete  CSF culture with Stat gram stain     Status: None   Collection Time: 04/17/16 10:00 AM  Result Value Ref Range Status   Specimen Description CSF  TUBE 2  Final   Special Requests Immunocompromised  Final   Gram  Stain NO WBC SEEN NO ORGANISMS SEEN   Final   Culture NO GROWTH 3 DAYS  Final   Report Status 04/20/2016 FINAL  Final  Acid Fast Smear (AFB)     Status: None   Collection Time: 04/17/16 10:00 AM  Result Value Ref Range Status   AFB Specimen Processing Concentration  Final   Acid Fast Smear Negative  Final    Comment: (NOTE) Performed At: [facility] [address] [place], [place] [id number] Lindon Romp MD [id number]    Source (AFB) CSF  Final    Comment:  TUBE 2  Fungus Culture With Stain     Status: None (Preliminary result)   Collection Time: 04/17/16 10:00 AM  Result Value Ref Range Status   Fungus Stain Final report  Final    Comment: (NOTE) Performed At: [facility] [place], [place] [id number] Lindon Romp MD OA:[id number]    Fungus (Mycology) Culture PENDING  Incomplete   Fungal Source CSF  Final    Comment:  TUBE 2  Fungus Culture Result     Status: None   Collection Time: 04/17/16 10:00 AM  Result Value Ref Range Status   Result 1 Comment  Final    Comment: (NOTE)  KOH/Calcofluor preparation:  no fungus observed. Performed At: [facility] [place], [place] [id number] Lindon Romp MD SW:[id number]     Impression/Plan:  1. Right optic neuropathy - improving since starting steroids.  With no active lesions, negative VZV PCR, no indication for acyclovir.  Responded well to steroids.   Continue steroids per neurology.  Stop acyclovir, d/c isolation  2.  HIV - back on Odefsey and has drug assistance back in place.  He has an appt and is aware with Dr. Johnnye Sima on 3/19.  His ADAP pharmacy is [facility] on [address] at [employer] if his prescription can be sent there (or written)  3.  Headache - improved now.  Continuing with supportive care.

## 2016-04-21 NOTE — Progress Notes (Signed)
[facility] PROGRESS NOTE  GIANLUCAS EVENSON QZE:[id number] DOB: 1965-08-04 DOA: 04/16/2016  PCP: Bobby Rumpf, MD  Brief History/Interval Summary: 51 year old male with a past medical history of HIV infection, not being treated since December 2017, history of anxiety and depression, hypertension, presented with loss of vision in his right eye. Patient woke up on 2/24 and he felt like someone had hit him in the head with a hammer, drove a stake through his head. Came to ER, treated for migraine, normal ESR. Pain was unrelenting, came back on 2/28. Diagnosed with shingles. Went to eye doctor and he immediately sent to the ER for MRI. Vision problems started on the 24th, worse on the 28th, by 3/7 he was unable to see out of his right eye. MRI raised concern for optic neuritis. Patient was hospitalized for further management.  Reason for Visit: Optic neuritis  Consultants: Neurology. Infectious disease. Ophthalmology  Procedures: Lumbar puncture 3/9  Antibiotics: IV acyclovir Doxycycline stopped on 3/9.  Subjective/Interval History: Patient continues to improve. States that he is able to see much more. Right eye pain has almost resolved. Occasional headaches.   ROS: Denies any nausea or vomiting  Objective:  Vital Signs  Vitals:   04/19/16 2130 04/20/16 0618 04/20/16 1447 04/21/16 0538  BP: (!) 145/76 (!) 148/87 (!) 147/89 (!) 145/63  Pulse: 80 70 68 (!) 57  Resp: _0 Temp: 98.7 F (37.1 C) 98.4 F (36.9 C) 98.2 F (36.8 C) 98.9 F (37.2 C)  TempSrc: Oral Oral Oral Oral  SpO2: 100% 100% 100% 94%  Weight:      Height:        Intake/Output Summary (Last 24 hours) at 04/21/16 0840 Last data filed at 04/21/16 0540  Gross per 24 hour  Intake             1788 ml  Output                0 ml  Net             1788 ml   Filed Weights   04/17/16 0208  Weight: 65.3 kg (143 lb 15.4 oz)    General appearance: alert, cooperative, appears stated age and no  distress Resp: clear to auscultation bilaterally Cardio: regular rate and rhythm, S1, S2 normal, no murmur, click, rub or gallop GI: soft, non-tender; bowel sounds normal; no masses,  no organomegaly Neurologic: No focal neurological deficits noted.  Lab Results:  Data Reviewed: I have personally reviewed following labs and imaging studies  CBC:  Recent Labs Lab 04/16/16 1825 04/17/16 0841 04/18/16 0536  WBC 6.6 5.1 7.5  NEUTROABS 2.8  --   --   HGB 12.7* 12.1* 13.8  HCT 37.6* 35.9* 40.5  MCV 92.2 91.8 91.6  PLT 281 256 226    Basic Metabolic Panel:  Recent Labs Lab 04/16/16 1825 04/17/16 0841 04/18/16 0536  NA 136 138 135  K 3.6 3.4* 4.5  CL 104 104 100*  CO2 _1 GLUCOSE 103* 117* 150*  BUN _2 CREATININE 1.10 1.03 1.16  CALCIUM 9.0 9.0 9.7    GFR: Estimated Creatinine Clearance: 71.9 mL/min (by C-G formula based on SCr of 1.16 mg/dL).  Liver Function Tests:  Recent Labs Lab 04/17/16 1000  ALBUMIN 4.2     Recent Results (from the past 240 hour(s))  Culture, blood (routine x 2)     Status: None (Preliminary result)   Collection Time: 04/16/16  11:34 PM  Result Value Ref Range Status   Specimen Description BLOOD RIGHT ARM  Final   Special Requests AEROBIC BOTTLE ONLY 7ML  Final   Culture NO GROWTH 3 DAYS  Final   Report Status PENDING  Incomplete  Culture, blood (routine x 2)     Status: None (Preliminary result)   Collection Time: 04/16/16 11:50 PM  Result Value Ref Range Status   Specimen Description BLOOD LEFT HAND  Final   Special Requests AEROBIC BOTTLE ONLY 5ML  Final   Culture NO GROWTH 3 DAYS  Final   Report Status PENDING  Incomplete  CSF culture with Stat gram stain     Status: None   Collection Time: 04/17/16 10:00 AM  Result Value Ref Range Status   Specimen Description CSF  TUBE 2  Final   Special Requests Immunocompromised  Final   Gram Stain NO WBC SEEN NO ORGANISMS SEEN   Final   Culture NO GROWTH 3 DAYS  Final    Report Status 04/20/2016 FINAL  Final  Acid Fast Smear (AFB)     Status: None   Collection Time: 04/17/16 10:00 AM  Result Value Ref Range Status   AFB Specimen Processing Concentration  Final   Acid Fast Smear Negative  Final    Comment: (NOTE) Performed At: [facility] [place], [place] [id number] Lindon Romp MD GE:[id number]    Source (AFB) CSF  Final    Comment:  TUBE 2      Radiology Studies: No results found.   Medications:  Scheduled: . acyclovir  700 mg Intravenous Q8H  . amLODipine  5 mg Oral Daily  . emtricitabine-rilpivir-tenofovir AF  1 tablet Oral Q breakfast  . lidocaine  10 mL Infiltration Once  . lisinopril  40 mg Oral Daily  . methylPREDNISolone (SOLU-MEDROL) injection  500 mg Intravenous Q12H  . senna  2 tablet Oral Daily  . vitamin B-12  1,000 mcg Oral Daily   Continuous:  [id number] **OR** acetaminophen, HYDROcodone-acetaminophen, ondansetron **OR** ondansetron (ZOFRAN) IV  Assessment/Plan:  Principal Problem:   Optic neuritis Active Problems:   HIV (human immunodeficiency virus infection) ([place])   Other headache syndrome    Optic neuritis Concern for viral given recent diagnosis of shingles. Patient being treated with IV acyclovir. HSV was negative. VZV PCR Is also negative. Patient was also started on IV doxycycline. Patient seen by neurology. He underwent lumbar puncture. WBC was 39 and 28 in two different tubes. Total protein 54. Glucose 57. Not conclusive for bacterial meningitis. Gram stain negative. Culture is negative so far. Neurology and infectious disease is following. Appreciate ophthalmology input as well. Doxycycline was discontinued. Patient has also been started on high-dose steroids. Patient has had improvement in his vision. Acyclovir could be discontinued per infectious disease. He will complete 5 days of high-dose Solu-Medrol tonight. We'll need to determine if he needs tapering doses of  steroids.  HIV HIV viral load, 69,300 on 3/5. CD4 count 620 on 3/5. He stopped taking his medications in December due to financial and logistical issues. Infectious disease is following. He is back on antiretroviral treatment. He may need assistance in arranging medications as an outpatient. Of note, his father is his only family member who knows about his HIV status. He is married but does not have sexual relations with his wife and she is unaware. He requests that his conditions not be discussed in front of family members.  Essential hypertension. Continue with amlodipine and lisinopril. Blood  pressure is stable, though not optimally controlled yet.   DVT Prophylaxis: SCDs    Code Status: DNR  Family Communication: Discussed with the patient  Disposition Plan: Management as outlined above. Mobilize as tolerated.     LOS: 5 days   [facility] Pager [phone] 04/21/2016, 8:40 AM  If 7PM-7AM, please contact night-coverage at [email or web address], password [facility]

## 2016-04-22 ENCOUNTER — Other Ambulatory Visit: Payer: Self-pay | Admitting: *Deleted

## 2016-04-22 ENCOUNTER — Telehealth: Payer: Self-pay | Admitting: *Deleted

## 2016-04-22 ENCOUNTER — Other Ambulatory Visit: Payer: Self-pay

## 2016-04-22 DIAGNOSIS — Z91199 Patient's noncompliance with other medical treatment and regimen due to unspecified reason: Secondary | ICD-10-CM

## 2016-04-22 DIAGNOSIS — I10 Essential (primary) hypertension: Secondary | ICD-10-CM

## 2016-04-22 DIAGNOSIS — B20 Human immunodeficiency virus [HIV] disease: Secondary | ICD-10-CM

## 2016-04-22 DIAGNOSIS — H5461 Unqualified visual loss, right eye, normal vision left eye: Secondary | ICD-10-CM

## 2016-04-22 DIAGNOSIS — Z9119 Patient's noncompliance with other medical treatment and regimen: Secondary | ICD-10-CM

## 2016-04-22 LAB — CULTURE, BLOOD (ROUTINE X 2)
CULTURE: NO GROWTH
CULTURE: NO GROWTH

## 2016-04-22 LAB — OLIGOCLONAL BANDS, CSF + SERM

## 2016-04-22 MED ORDER — EMTRICITAB-RILPIVIR-TENOFOV AF 200-25-25 MG PO TABS: 1.0000 | ORAL_TABLET | Freq: Every day | ORAL | 3 refills | Status: DC

## 2016-04-22 MED ORDER — PREDNISONE 5 MG PO TABS: 5.0000 mg | ORAL_TABLET | Freq: Every day | ORAL | Status: DC

## 2016-04-22 NOTE — Telephone Encounter (Signed)
Requesting script for ADAP.   Laverle Patter, RN

## 2016-04-22 NOTE — Telephone Encounter (Signed)
Patient called requesting refills on lisinopril and norvasc to be sent to [facility] on [address]. Advised we can not refill while he is in the hospital; but we can send in the morning once he is discharged. Myrtis Hopping

## 2016-04-22 NOTE — Discharge Summary (Signed)
Physician Discharge Summary  Ryan Cruz LLV:[id number] DOB: [birth year] DOA: 04/16/2016  PCP: Bobby Rumpf, MD  Admit date: 04/16/2016 Discharge date: 04/22/2016  Admitted From: Home Disposition:  home  Recommendations for Outpatient Follow-up:  1. Follow up with PCP in 2-3 weeks 2. Follow up with Infectious disease as scheduled 3. Follow up with Neurology and Opthalmology in 2-3 weeks  Discharge Condition:Stable CODE STATUS:DNR Diet recommendation: Regular   Brief/Interim Summary: 51 year old male with a past medical history of HIV infection, not being treated since December 2017, history of anxiety and depression, hypertension, presented with loss of vision in his right eye. Patient woke up on 2/24 and he felt like someone had hit him in the head with a hammer, drove a stake through his head. Came to ER, treated for migraine, normal ESR. Pain was unrelenting, came back on 2/28. Diagnosed with shingles. Went to eye doctor and he immediately sent to the ER for MRI. Vision problems started on the 24th, worse on the 28th, by 3/7 he was unable to see out of his right eye. MRI raised concern for optic neuritis. Patient was hospitalized for further management.  Optic neuritis Concern for viral given recent diagnosis of shingles. Patient being treated with IV acyclovir. HSV was negative. VZV PCR Is also negative. Patient was also started on IV doxycycline. Patient seen by neurology. He underwent lumbar puncture. WBC was 39 and 28 in two different tubes. Total protein 54. Glucose 57. Not conclusive for bacterial meningitis. Gram stain negative. Culture is negative so far. Neurology and infectious disease is following. Appreciate ophthalmology input as well. Doxycycline was discontinued. Patient has also been started on high-dose steroids. Patient has had improvement in his vision. Acyclovir could be discontinued per infectious disease. Patient completed 5 days of high-dose Solu-Medrol. Neurology has  recommended PO prednisone taper on discharge and had signed off.  HIV HIV viral load, 69,300 on 3/5. CD4 count 620 on 3/5. He stopped taking his medications in December due to financial and logistical issues. Infectious disease is following. He is back on antiretroviral treatment. He may need assistance in arranging medications as an outpatient. Of note, his father is his only family member who knows about his HIV status. He is married but does not have sexual relations with his wife and she is unaware. He requests that his conditions not be discussed in front of family members.  Essential hypertension. Continued with amlodipine and lisinopril. Blood pressure remained stable, though not optimally controlled yet.  Discharge Diagnoses:  Principal Problem:   Optic neuritis Active Problems:   HIV (human immunodeficiency virus infection) ([place])   Other headache syndrome    Discharge Instructions  Discharge Instructions    Ambulatory referral to Neurology    Complete by:  As directed    An appointment is requested in approximately: 2 weeks for optic neuritis     Allergies as of 04/22/2016   No Known Allergies     Medication List    STOP taking these medications   acyclovir 800 MG tablet Commonly known as:  ZOVIRAX   HYDROcodone-acetaminophen 5-325 MG tablet Commonly known as:  NORCO     TAKE these medications   amLODipine 5 MG tablet Commonly known as:  NORVASC Take 1 tablet (5 mg total) by mouth daily.   emtricitabine-rilpivir-tenofovir AF 200-25-25 MG Tabs tablet Commonly known as:  ODEFSEY Take 1 tablet by mouth daily with breakfast.   lisinopril 40 MG tablet Commonly known as:  PRINIVIL,ZESTRIL Take 1 tablet (40 mg  total) by mouth daily.   ondansetron 4 MG disintegrating tablet Commonly known as:  ZOFRAN ODT Take 1 tablet (4 mg total) by mouth every 8 (eight) hours as needed for nausea or vomiting.   predniSONE 5 MG tablet Commonly known as:  DELTASONE Take 1  tablet (5 mg total) by mouth daily with breakfast.   pseudoephedrine-acetaminophen 30-500 MG Tabs tablet Commonly known as:  TYLENOL SINUS Take 1 tablet by mouth every 4 (four) hours as needed (sinus congestion).      Follow-up Information    Bobby Rumpf, MD. Schedule an appointment as soon as possible for a visit in 2 week(s).   Specialty:  Infectious Diseases Contact information: [address] [place] [address] [phone]        Bobby Rumpf, MD Follow up.   Specialty:  Infectious Diseases Contact information: [address] [place] [address] [phone]        Follow up with your opthalmologist in 2-3 weeks. Schedule an appointment as soon as possible for a visit.        Follow up with Neurology as scheduled Follow up.          No Known Allergies  Consultations:  ID  Neurology  Procedures/Studies: Ct Angio Head W Or Wo Contrast  Result Date: 04/04/2016 CLINICAL DATA:  Headache. Sharp pain in right side of head. Feels like a nail is sticking into the right side of his head. EXAM: CT ANGIOGRAPHY HEAD AND NECK TECHNIQUE: Multidetector CT imaging of the head and neck was performed using the standard protocol during bolus administration of intravenous contrast. Multiplanar CT image reconstructions and MIPs were obtained to evaluate the vascular anatomy. Carotid stenosis measurements (when applicable) are obtained utilizing NASCET criteria, using the distal internal carotid diameter as the denominator. CONTRAST:  80 mL Isovue 370 COMPARISON:  MRI of the cervical spine 05/09/2008. CT of the head 05/17/2007 FINDINGS: CT HEAD FINDINGS Brain: No acute infarct, hemorrhage, or mass lesion is present. The ventricles are of normal size. No significant extraaxial fluid collection is present. No significant white matter disease is present. The brainstem and cerebellum are unremarkable. Vascular: No hyperdense vessel or unexpected  calcification. Skull: The calvarium is within normal limits. No focal lytic or blastic lesions are present. Sinuses: The paranasal sinuses and mastoid air cells are clear. Orbits: Bilateral globes and orbits are unremarkable. Review of the MIP images confirms the above findings CTA NECK FINDINGS Aortic arch: Minimal atherosclerotic calcifications are present in the distal arch. There is no significant calcification at the origins the great vessels. A 3 vessel configuration is present. Right carotid system: Right common carotid artery is within normal limits. Right carotid bifurcation is normal. The cervical right ICA is unremarkable. Left carotid system: The left common carotid artery is within normal limits. Bifurcation is normal. The cervical left ICA is normal. Vertebral arteries: The vertebral arteries originate from the subclavian arteries bilaterally. The right vertebral artery is the dominant vessel. There is no focal stenosis or vascular injury in the neck. Skeleton: Endplate degenerative change is present at C4-5 and C6-7. Osseous foraminal narrowing is present on the left at C6-7 and C7-T1. Other neck: The soft tissues the neck are unremarkable. No focal mucosal or submucosal lesions are present. Salivary glands are normal. The thyroid is normal. Level 2 lymph nodes appear reactive. Upper chest: The lung apices are clear. The superior mediastinum is unremarkable. Review of the MIP images confirms the above findings CTA HEAD  FINDINGS Anterior circulation: Minimal calcifications are present within the cavernous internal carotid arteries. There is no focal stenosis. The ICA termini are intact. The A1 and M1 segments are normal. Anterior communicating artery is patent. MCA bifurcations are intact. ACA and MCA branch vessels are within normal limits. Posterior circulation: The right vertebral artery is the dominant vessel. PICA origins are visualized and normal. The vertebrobasilar junction is normal. Both  posterior cerebral arteries originate from the basilar tip. The PCA branch vessels are normal. Venous sinuses: The dural sinuses are patent. The transverse sinuses are codominant. The straight sinus and deep cerebral veins are intact. Cortical veins are unremarkable. Anatomic variants: None Delayed phase: The postcontrast images demonstrate no pathologic enhancement. Review of the MIP images confirms the above findings IMPRESSION: 1. Minimal atherosclerotic changes at the aortic arch and cavernous internal carotid arteries without significant stenosis. 2. Mild endplate degenerative changes in the cervical spine with left foraminal narrowing at C6-7 and C7-T1. 3. No significant proximal stenosis, aneurysm, or branch vessel occlusion within the circle of Willis. Electronically Signed   By: San Morelle M.D.   On: 04/04/2016 13:39   Ct Angio Neck W And/or Wo Contrast  Result Date: 04/04/2016 CLINICAL DATA:  Headache. Sharp pain in right side of head. Feels like a nail is sticking into the right side of his head. EXAM: CT ANGIOGRAPHY HEAD AND NECK TECHNIQUE: Multidetector CT imaging of the head and neck was performed using the standard protocol during bolus administration of intravenous contrast. Multiplanar CT image reconstructions and MIPs were obtained to evaluate the vascular anatomy. Carotid stenosis measurements (when applicable) are obtained utilizing NASCET criteria, using the distal internal carotid diameter as the denominator. CONTRAST:  80 mL Isovue 370 COMPARISON:  MRI of the cervical spine 05/09/2008. CT of the head 05/17/2007 FINDINGS: CT HEAD FINDINGS Brain: No acute infarct, hemorrhage, or mass lesion is present. The ventricles are of normal size. No significant extraaxial fluid collection is present. No significant white matter disease is present. The brainstem and cerebellum are unremarkable. Vascular: No hyperdense vessel or unexpected calcification. Skull: The calvarium is within normal  limits. No focal lytic or blastic lesions are present. Sinuses: The paranasal sinuses and mastoid air cells are clear. Orbits: Bilateral globes and orbits are unremarkable. Review of the MIP images confirms the above findings CTA NECK FINDINGS Aortic arch: Minimal atherosclerotic calcifications are present in the distal arch. There is no significant calcification at the origins the great vessels. A 3 vessel configuration is present. Right carotid system: Right common carotid artery is within normal limits. Right carotid bifurcation is normal. The cervical right ICA is unremarkable. Left carotid system: The left common carotid artery is within normal limits. Bifurcation is normal. The cervical left ICA is normal. Vertebral arteries: The vertebral arteries originate from the subclavian arteries bilaterally. The right vertebral artery is the dominant vessel. There is no focal stenosis or vascular injury in the neck. Skeleton: Endplate degenerative change is present at C4-5 and C6-7. Osseous foraminal narrowing is present on the left at C6-7 and C7-T1. Other neck: The soft tissues the neck are unremarkable. No focal mucosal or submucosal lesions are present. Salivary glands are normal. The thyroid is normal. Level 2 lymph nodes appear reactive. Upper chest: The lung apices are clear. The superior mediastinum is unremarkable. Review of the MIP images confirms the above findings CTA HEAD FINDINGS Anterior circulation: Minimal calcifications are present within the cavernous internal carotid arteries. There is no focal stenosis. The ICA termini are  intact. The A1 and M1 segments are normal. Anterior communicating artery is patent. MCA bifurcations are intact. ACA and MCA branch vessels are within normal limits. Posterior circulation: The right vertebral artery is the dominant vessel. PICA origins are visualized and normal. The vertebrobasilar junction is normal. Both posterior cerebral arteries originate from the basilar  tip. The PCA branch vessels are normal. Venous sinuses: The dural sinuses are patent. The transverse sinuses are codominant. The straight sinus and deep cerebral veins are intact. Cortical veins are unremarkable. Anatomic variants: None Delayed phase: The postcontrast images demonstrate no pathologic enhancement. Review of the MIP images confirms the above findings IMPRESSION: 1. Minimal atherosclerotic changes at the aortic arch and cavernous internal carotid arteries without significant stenosis. 2. Mild endplate degenerative changes in the cervical spine with left foraminal narrowing at C6-7 and C7-T1. 3. No significant proximal stenosis, aneurysm, or branch vessel occlusion within the circle of Willis. Electronically Signed   By: San Morelle M.D.   On: 04/04/2016 13:39   Mr Jeri Cos IO Contrast  Addendum Date: 04/16/2016   ADDENDUM REPORT: 04/16/2016 21:33 ADDENDUM: Acute findings discussed with and reconfirmed by Dr.Lindzen, Neurology on 04/16/2016 at 9:15 pm. Electronically Signed   By: Elon Alas M.D.   On: 04/16/2016 21:33   Result Date: 04/16/2016 CLINICAL DATA:  RIGHT eye vision loss today. Eye shingles with reported optic nerve swelling, on acyclovir for a week. Migraines. History of hypertension and HIV. EXAM: MRI HEAD AND ORBITS WITHOUT AND WITH CONTRAST TECHNIQUE: Multiplanar, multiecho pulse sequences of the brain and surrounding structures were obtained without and with intravenous contrast. Multiplanar, multiecho pulse sequences of the orbits and surrounding structures were obtained including fat saturation techniques, before and after intravenous contrast administration. CONTRAST:  58m MULTIHANCE GADOBENATE DIMEGLUMINE 529 MG/ML IV SOLN COMPARISON:  CT HEAD February 28th 2018 and CT angiogram head and neck April 04, 2016 FINDINGS: MRI HEAD FINDINGS- Mild motion degraded examination. INTRACRANIAL CONTENTS: No reduced diffusion to suggest acute ischemia nor hyperacute  demyelination. No susceptibility artifact to suggest hemorrhage. The ventricles and sulci are normal for patient's age. No suspicious parenchymal signal, masses, mass effect. No abnormal intraparenchymal or extra-axial enhancement. No abnormal extra-axial fluid collections. No extra-axial masses. VASCULAR: Normal major intracranial vascular flow voids present at skull base. SKULL AND UPPER CERVICAL SPINE: No abnormal sellar expansion. No suspicious calvarial bone marrow signal. Craniocervical junction maintained. OTHER: None. MRI ORBITS FINDINGS- moderately motion degraded examination. ORBITS: Reduced diffusion RIGHT optic nerve within the intra-ocular ocular and anterior intraorbital segments with reduced diffusion, expanded bright T2 signal. Enhancement RIGHT optic nerve from the intraorbital segment through the intracanalicular segment. Ocular globes are intact with normal signal. Lenses are located. Preservation of the orbital fat. Normal symmetric appearance of the extraocular muscles. No intra-ocular mass, signal abnormality nor abnormal enhancement. Superior ophthalmic veins are not enlarged. VISUALIZED SINUSES: Trace paranasal sinus mucosal thickening without air-fluid levels. Mastoid air cells are well aerated. SOFT TISSUES: Normal. 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 However, superimposed reduced diffusion within the optic nerve concerning for ischemia/infarct (potentially secondary to local vasculopathy). MRI brain:  Normal. Acute findings discussed with and reconfirmed by PA WWaynetta Peanon 04/16/2016 at 8:23 pm. Electronically Signed: By: CElon AlasM.D. On: 04/16/2016 20:25   Ct Orbits Wo Contrast  Result Date: 04/08/2016 CLINICAL DATA:  Recent headaches. Intense right eye pain and visual disturbances. EXAM: CT ORBITS WITHOUT CONTRAST TECHNIQUE: Multidetector CT  images were obtained using the standard  protocol without intravenous contrast. COMPARISON:  CTA head and neck 04/04/2016 FINDINGS: Orbits: The globes are normal bilaterally. The lenses are located. The extraocular muscles are within normal limits. The optic nerves are normal bilaterally. Lacrimal glands are unremarkable. Visualized sinuses: The paranasal sinuses and mastoid air cells are clear. Soft tissues: The periorbital soft tissues are within normal limits. Limited intracranial: Limited intracranial imaging demonstrates a normal appearance of the brain. IMPRESSION: 1. Negative CT of the orbits.  No significant interval change. Electronically Signed   By: San Morelle M.D.   On: 04/08/2016 21:49   Mr Rosealee Albee FD Contrast  Addendum Date: 04/16/2016   ADDENDUM REPORT: 04/16/2016 21:33 ADDENDUM: Acute findings discussed with and reconfirmed by Dr.Lindzen, Neurology on 04/16/2016 at 9:15 pm. Electronically Signed   By: Elon Alas M.D.   On: 04/16/2016 21:33   Result Date: 04/16/2016 CLINICAL DATA:  RIGHT eye vision loss today. Eye shingles with reported optic nerve swelling, on acyclovir for a week. Migraines. History of hypertension and HIV. EXAM: MRI HEAD AND ORBITS WITHOUT AND WITH CONTRAST TECHNIQUE: Multiplanar, multiecho pulse sequences of the brain and surrounding structures were obtained without and with intravenous contrast. Multiplanar, multiecho pulse sequences of the orbits and surrounding structures were obtained including fat saturation techniques, before and after intravenous contrast administration. CONTRAST:  57m MULTIHANCE GADOBENATE DIMEGLUMINE 529 MG/ML IV SOLN COMPARISON:  CT HEAD February 28th 2018 and CT angiogram head and neck April 04, 2016 FINDINGS: MRI HEAD FINDINGS- Mild motion degraded examination. INTRACRANIAL CONTENTS: No reduced diffusion to suggest acute ischemia nor hyperacute demyelination. No susceptibility artifact to suggest hemorrhage. The ventricles and sulci are normal for patient's age. No  suspicious parenchymal signal, masses, mass effect. No abnormal intraparenchymal or extra-axial enhancement. No abnormal extra-axial fluid collections. No extra-axial masses. VASCULAR: Normal major intracranial vascular flow voids present at skull base. SKULL AND UPPER CERVICAL SPINE: No abnormal sellar expansion. No suspicious calvarial bone marrow signal. Craniocervical junction maintained. OTHER: None. MRI ORBITS FINDINGS- moderately motion degraded examination. ORBITS: Reduced diffusion RIGHT optic nerve within the intra-ocular ocular and anterior intraorbital segments with reduced diffusion, expanded bright T2 signal. Enhancement RIGHT optic nerve from the intraorbital segment through the intracanalicular segment. Ocular globes are intact with normal signal. Lenses are located. Preservation of the orbital fat. Normal symmetric appearance of the extraocular muscles. No intra-ocular mass, signal abnormality nor abnormal enhancement. Superior ophthalmic veins are not enlarged. VISUALIZED SINUSES: Trace paranasal sinus mucosal thickening without air-fluid levels. Mastoid air cells are well aerated. SOFT TISSUES: Normal. 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 However, superimposed reduced diffusion within the optic nerve concerning for ischemia/infarct (potentially secondary to local vasculopathy). MRI brain:  Normal. Acute findings discussed with and reconfirmed by PA WWaynetta Peanon 04/16/2016 at 8:23 pm. Electronically Signed: By: CElon AlasM.D. On: 04/16/2016 20:25    Subjective: No complaints  Discharge Exam: Vitals:   04/21/16 2252 04/22/16 0627  BP: (!) 153/94 136/85  Pulse: 69 64  Resp: 17 18  Temp: 98.5 F (36.9 C) 98.5 F (36.9 C)   Vitals:   04/21/16 0538 04/21/16 1300 04/21/16 2252 04/22/16 0627  BP: (!) 145/63 (!) 153/82 (!) 153/94 136/85  Pulse: (!) 57 61 69 64  Resp: _0 Temp: 98.9 F  (37.2 C) 98.6 F (37 C) 98.5 F (36.9 C) 98.5 F (36.9 C)  TempSrc: Oral Oral  Oral Oral  SpO2: 94% 99% 100% 100%  Weight:      Height:        General: Pt is alert, awake, not in acute distress Cardiovascular: RRR, S1/S2 +, no rubs, no gallops Respiratory: CTA bilaterally, no wheezing, no rhonchi Abdominal: Soft, NT, ND, bowel sounds + Extremities: no edema, no cyanosis   The results of significant diagnostics from this hospitalization (including imaging, microbiology, ancillary and laboratory) are listed below for reference.     Microbiology: Recent Results (from the past 240 hour(s))  Culture, blood (routine x 2)     Status: None   Collection Time: 04/16/16 11:34 PM  Result Value Ref Range Status   Specimen Description BLOOD RIGHT ARM  Final   Special Requests AEROBIC BOTTLE ONLY 7ML  Final   Culture NO GROWTH 5 DAYS  Final   Report Status 04/22/2016 FINAL  Final  Culture, blood (routine x 2)     Status: None   Collection Time: 04/16/16 11:50 PM  Result Value Ref Range Status   Specimen Description BLOOD LEFT HAND  Final   Special Requests AEROBIC BOTTLE ONLY 5ML  Final   Culture NO GROWTH 5 DAYS  Final   Report Status 04/22/2016 FINAL  Final  CSF culture with Stat gram stain     Status: None   Collection Time: 04/17/16 10:00 AM  Result Value Ref Range Status   Specimen Description CSF  TUBE 2  Final   Special Requests Immunocompromised  Final   Gram Stain NO WBC SEEN NO ORGANISMS SEEN   Final   Culture NO GROWTH 3 DAYS  Final   Report Status 04/20/2016 FINAL  Final  Acid Fast Smear (AFB)     Status: None   Collection Time: 04/17/16 10:00 AM  Result Value Ref Range Status   AFB Specimen Processing Concentration  Final   Acid Fast Smear Negative  Final    Comment: (NOTE) Performed At: [facility] [place], [place] [id number] Lindon Romp MD DX:[id number]    Source (AFB) CSF  Final    Comment:  TUBE 2  Fungus Culture With Stain      Status: None (Preliminary result)   Collection Time: 04/17/16 10:00 AM  Result Value Ref Range Status   Fungus Stain Final report  Final    Comment: (NOTE) Performed At: [facility] [place], [place] [id number] Lindon Romp MD EZ:[id number]    Fungus (Mycology) Culture PENDING  Incomplete   Fungal Source CSF  Final    Comment:  TUBE 2  Fungus Culture Result     Status: None   Collection Time: 04/17/16 10:00 AM  Result Value Ref Range Status   Result 1 Comment  Final    Comment: (NOTE) KOH/Calcofluor preparation:  no fungus observed. Performed At: [facility] [place], [place] [id number] Lindon Romp MD LE:[id number]      Labs: BNP (last 3 results) No results for input(s): BNP in the last 8760 hours. Basic Metabolic Panel:  Recent Labs Lab 04/16/16 1825 04/17/16 0841 04/18/16 0536 04/21/16 0737  NA 136 138 135 137  K 3.6 3.4* 4.5 3.9  CL 104 104 100* 102  CO2 _0 GLUCOSE 103* 117* 150* 139*  BUN _1 CREATININE 1.10 1.03 1.16 1.05  CALCIUM 9.0 9.0 9.7 9.3   Liver Function Tests:  Recent Labs Lab 04/17/16 1000  ALBUMIN 4.2   No results  for input(s): LIPASE, AMYLASE in the last 168 hours. No results for input(s): AMMONIA in the last 168 hours. CBC:  Recent Labs Lab 04/16/16 1825 04/17/16 0841 04/18/16 0536 04/21/16 0737  WBC 6.6 5.1 7.5 14.3*  NEUTROABS 2.8  --   --   --   HGB 12.7* 12.1* 13.8 12.8*  HCT 37.6* 35.9* 40.5 38.0*  MCV 92.2 91.8 91.6 92.5  PLT 281 256 296 260   Cardiac Enzymes: No results for input(s): CKTOTAL, CKMB, CKMBINDEX, TROPONINI in the last 168 hours. BNP: Invalid input(s): POCBNP CBG: No results for input(s): GLUCAP in the last 168 hours. D-Dimer No results for input(s): DDIMER in the last 72 hours. Hgb A1c No results for input(s): HGBA1C in the last 72 hours. Lipid Profile No results for input(s): CHOL, HDL, LDLCALC, TRIG, CHOLHDL, LDLDIRECT in the  last 72 hours. Thyroid function studies No results for input(s): TSH, T4TOTAL, T3FREE, THYROIDAB in the last 72 hours.  Invalid input(s): FREET3 Anemia work up No results for input(s): VITAMINB12, FOLATE, FERRITIN, TIBC, IRON, RETICCTPCT in the last 72 hours. Urinalysis    Component Value Date/Time   COLORURINE YELLOW 03/13/2011 0908   APPEARANCEUR CLEAR 03/13/2011 0908   LABSPEC 1.021 03/13/2011 0908   PHURINE 5.5 03/13/2011 0908   GLUCOSEU NEG 03/13/2011 0908   GLUCOSEU NEG mg/dL 09/03/2006 2056   HGBUR NEG 03/13/2011 0908   HGBUR moderate 09/03/2006 1422   BILIRUBINUR NEG 03/13/2011 0908   KETONESUR NEG 03/13/2011 0908   PROTEINUR NEG 03/13/2011 0908   UROBILINOGEN 0.2 03/13/2011 0908   NITRITE NEG 03/13/2011 0908   LEUKOCYTESUR NEG 03/13/2011 0908   Sepsis Labs Invalid input(s): PROCALCITONIN,  WBC,  LACTICIDVEN Microbiology Recent Results (from the past 240 hour(s))  Culture, blood (routine x 2)     Status: None   Collection Time: 04/16/16 11:34 PM  Result Value Ref Range Status   Specimen Description BLOOD RIGHT ARM  Final   Special Requests AEROBIC BOTTLE ONLY 7ML  Final   Culture NO GROWTH 5 DAYS  Final   Report Status 04/22/2016 FINAL  Final  Culture, blood (routine x 2)     Status: None   Collection Time: 04/16/16 11:50 PM  Result Value Ref Range Status   Specimen Description BLOOD LEFT HAND  Final   Special Requests AEROBIC BOTTLE ONLY 5ML  Final   Culture NO GROWTH 5 DAYS  Final   Report Status 04/22/2016 FINAL  Final  CSF culture with Stat gram stain     Status: None   Collection Time: 04/17/16 10:00 AM  Result Value Ref Range Status   Specimen Description CSF  TUBE 2  Final   Special Requests Immunocompromised  Final   Gram Stain NO WBC SEEN NO ORGANISMS SEEN   Final   Culture NO GROWTH 3 DAYS  Final   Report Status 04/20/2016 FINAL  Final  Acid Fast Smear (AFB)     Status: None   Collection Time: 04/17/16 10:00 AM  Result Value Ref Range Status    AFB Specimen Processing Concentration  Final   Acid Fast Smear Negative  Final    Comment: (NOTE) Performed At: [facility] [place], [place] [id number] Lindon Romp MD [id number]    Source (AFB) CSF  Final    Comment:  TUBE 2  Fungus Culture With Stain     Status: None (Preliminary result)   Collection Time: 04/17/16 10:00 AM  Result Value Ref Range Status   Fungus Stain Final report  Final    Comment: (NOTE) Performed At: [facility] [place], [place] [id number] Lindon Romp MD [id number]    Fungus (Mycology) Culture PENDING  Incomplete   Fungal Source CSF  Final    Comment:  TUBE 2  Fungus Culture Result     Status: None   Collection Time: 04/17/16 10:00 AM  Result Value Ref Range Status   Result 1 Comment  Final    Comment: (NOTE) KOH/Calcofluor preparation:  no fungus observed. Performed At: [facility] [place], [place] [id number] Lindon Romp MD [id number]      SIGNED:   Donne Hazel, MD  [facility] 04/22/2016, 12:19 PM  If 7PM-7AM, please contact night-coverage [email or web address] Password [email or web address]

## 2016-04-22 NOTE — Care Management Note (Signed)
Case Management Note  Patient Details  Name: Ryan Cruz MRN: [id number] Date of Birth: 1965/05/25  Subjective/Objective:                    Action/Plan:  [place] letter given and explained. Patient understands 042 medications not covered. He gets 042 medications through [facility] his pharmacy is [facility] on [address]. Expected Discharge Date:                  Expected Discharge Plan:  Home/Self Care  In-House Referral:     Discharge [place] [facility], [facility], Medication Assistance  Post Acute Care Choice:    Choice offered to:  Patient  DME Arranged:    DME Agency:     HH Arranged:    [place] Agency:     Status of Service:  Completed, signed off  If discussed at [employer], dates discussed:    Additional Comments:  Marilu Favre, RN 04/22/2016, 12:17 PM

## 2016-04-23 ENCOUNTER — Other Ambulatory Visit: Payer: Self-pay | Admitting: *Deleted

## 2016-04-23 ENCOUNTER — Encounter: Payer: Self-pay | Admitting: Infectious Diseases

## 2016-04-23 DIAGNOSIS — I10 Essential (primary) hypertension: Secondary | ICD-10-CM

## 2016-04-23 LAB — VDRL, CSF: VDRL Quant, CSF: NONREACTIVE

## 2016-04-23 MED ORDER — LISINOPRIL 40 MG PO TABS: 40.0000 mg | ORAL_TABLET | Freq: Every day | ORAL | 3 refills | Status: DC

## 2016-04-23 MED ORDER — AMLODIPINE BESYLATE 5 MG PO TABS: 5.0000 mg | ORAL_TABLET | Freq: Every day | ORAL | 3 refills | Status: DC

## 2016-04-24 ENCOUNTER — Ambulatory Visit: Payer: Self-pay | Admitting: Licensed Clinical Social Worker

## 2016-04-24 ENCOUNTER — Ambulatory Visit: Payer: Self-pay | Attending: Internal Medicine | Admitting: Physician Assistant

## 2016-04-24 VITALS — BP 158/97 | HR 77 | Temp 98.7°F | Wt 146.8 lb

## 2016-04-24 DIAGNOSIS — Z21 Asymptomatic human immunodeficiency virus [HIV] infection status: Secondary | ICD-10-CM | POA: Insufficient documentation

## 2016-04-24 DIAGNOSIS — G44319 Acute post-traumatic headache, not intractable: Secondary | ICD-10-CM | POA: Insufficient documentation

## 2016-04-24 DIAGNOSIS — H469 Unspecified optic neuritis: Secondary | ICD-10-CM | POA: Insufficient documentation

## 2016-04-24 DIAGNOSIS — Z79899 Other long term (current) drug therapy: Secondary | ICD-10-CM | POA: Insufficient documentation

## 2016-04-24 DIAGNOSIS — Z833 Family history of diabetes mellitus: Secondary | ICD-10-CM | POA: Insufficient documentation

## 2016-04-24 DIAGNOSIS — R739 Hyperglycemia, unspecified: Secondary | ICD-10-CM | POA: Insufficient documentation

## 2016-04-24 DIAGNOSIS — F419 Anxiety disorder, unspecified: Principal | ICD-10-CM

## 2016-04-24 DIAGNOSIS — I10 Essential (primary) hypertension: Secondary | ICD-10-CM | POA: Insufficient documentation

## 2016-04-24 DIAGNOSIS — F329 Major depressive disorder, single episode, unspecified: Secondary | ICD-10-CM | POA: Insufficient documentation

## 2016-04-24 LAB — POCT GLYCOSYLATED HEMOGLOBIN (HGB A1C): HEMOGLOBIN A1C: 5.8

## 2016-04-24 MED ORDER — AMLODIPINE BESYLATE 10 MG PO TABS: 10.0000 mg | ORAL_TABLET | Freq: Every day | ORAL | 3 refills | Status: DC

## 2016-04-24 MED ORDER — TRAMADOL HCL 50 MG PO TABS: 50.0000 mg | ORAL_TABLET | Freq: Three times a day (TID) | ORAL | 0 refills | Status: DC | PRN

## 2016-04-24 NOTE — Patient Instructions (Signed)
Check Blood pressure daily and record and bring to next visit

## 2016-04-24 NOTE — Progress Notes (Signed)
Patient ID: Ryan Cruz, male   DOB: 10-Jul-1965, 51 y.o.   MRN: [id number]      Ryan Cruz, is a 51 y.o. male  AUQ:[id number]  [id number]  DOB - 1965-08-31  Subjective:  Chief Complaint and HPI: Ryan Cruz is a 51 y.o. male here today to establish care and for a follow up visit After being hospitalized 04/16/2016-04/22/2016 for optic neuritis.  He has not had an opthalmology referral made.  He is HIV+ and receives care at [facility]/has appt with Dr. Johnnye Sima on Monday, 04/27/2016.  He had been off of HIV meds since 01/2016 due to financial and logistic constraints.  Anti-retrovirals resumed in the hospital.  From hospital note:  Brief/Interim Summary: 51 year old male with a past medical history of HIV infection, not being treated since December 2017, history of anxiety and depression, hypertension, presented with loss of vision in his right eye. Patient woke up on 2/24 and he felt like someone had hit him in the head with a hammer, drove a stake through his head. Came to ER, treated for migraine, normal ESR. Pain was unrelenting, came back on 2/28. Diagnosed with shingles. Went to eye doctor and he immediately sent to the ER for MRI. Vision problems started on the 24th, worse on the 28th, by 3/7 he was unable to see out of his right eye. MRI raised concern for optic neuritis. Patient was hospitalized for further management.  Optic neuritis Concern for viral given recent diagnosis of shingles. Patient being treated with IV acyclovir. HSV was negative. VZV PCR Is also negative. Patient was also started on IV doxycycline. Patient seen by neurology. He underwent lumbar puncture. WBC was 39 and 28 in two different tubes. Total protein 54. Glucose 57. Not conclusive for bacterial meningitis. Gram stain negative.Culture is negative so far. Neurology and infectious disease is following. Appreciate ophthalmology input as well. Doxycycline was discontinued. Patient has also been started on high-dose  steroids. Patient has had improvement in his vision. Acyclovir could be discontinued per infectious disease. Patient completed 5 days of high-dose Solu-Medrol. Neurology has recommended PO prednisone taper on discharge and had signed off.  HIV HIV viral load, 69,300 on 3/5. CD4 count 620 on 3/5. He stopped taking his medications in December due to financial and logistical issues. Infectious disease is following. He is back on antiretroviral treatment. He may need assistance in arranging medications as an outpatient. Of note, his father is his only family member who knows about his HIV status. He is married but does not have sexual relations with his wife and she is unaware. He requests that his conditions not be discussed in front of family members.  Essential hypertension. Continued with amlodipine and lisinopril. Blood pressure remained stable, though not optimally controlled yet.  Discharge Diagnoses:  Principal Problem:   Optic neuritis Active Problems:   HIV (human immunodeficiency virus infection) ([place])   Other headache syndrome  Today he presents with a headache s/p lumbar puncture.  His vision in his [name] is improving although still very blurry.  His L eye feels tired from having to compensate.  He also c/o general aches and pains and requests something for pain.  He was discharged on prednisone.  + continued photophobia and phonophobia  I noted some elevated blood sugars and checked an A1C today.  He is very anxious and afraid about everything that has happened recently.  He does admit to thinking the prednisone is contributing to his anxiety.  His dad is a Theme park manager and  counsels him.  His family is a strong support system.  He does have a strong faith and expresses his belief in God.  Says he would never harm himself.  No SI/HI.  No weapons in the home.  No mania/psychosis.  BP at home even prior to hospitalization usu 160s/90s.  He is compliant with meds.  Denies  CP/SOB/dizziness.  ED/Hospital notes/labs reviewed.   Social History:  Married/lives with wife and son.  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 EENT:  No hearing changes, but he does have phonophobia. No mouth, throat, or ear problems.  Respiratory: No cough, No SOB Cardiac: No CP, no palpitations GI:  No abd pain, No N/V/D. GU: No Urinary s/sx Musculoskeletal: No joint pain Neuro: + headache, no dizziness, no motor weakness.  Skin: No rash Endocrine:  No polydipsia. No polyuria.  Psych: Denies SI/HI  No problems updated.  ALLERGIES: No Known Allergies  PAST MEDICAL HISTORY: Past Medical History:  Diagnosis Date  . Anxiety   . Asthma   . Depression   . HIV (human immunodeficiency virus infection) ([place])   . Hypertension   . Hypoglycemia    sugars drop ion    MEDICATIONS AT HOME: Prior to Admission medications   Medication Sig Start Date End Date Taking? Authorizing Provider  emtricitabine-rilpivir-tenofovir AF (ODEFSEY) 200-25-25 MG TABS tablet Take 1 tablet by mouth daily with breakfast. 04/22/16  Yes Campbell Riches, MD  lisinopril (PRINIVIL,ZESTRIL) 40 MG tablet Take 1 tablet (40 mg total) by mouth daily. 04/23/16  Yes Campbell Riches, MD  ondansetron (ZOFRAN ODT) 4 MG disintegrating tablet Take 1 tablet (4 mg total) by mouth every 8 (eight) hours as needed for nausea or vomiting. 02/25/16  Yes Lysbeth Penner, FNP  predniSONE (DELTASONE) 5 MG tablet Take 1 tablet (5 mg total) by mouth daily with breakfast. 04/22/16  Yes Donne Hazel, MD  pseudoephedrine-acetaminophen (TYLENOL SINUS) 30-500 MG TABS tablet Take 1 tablet by mouth every 4 (four) hours as needed (sinus congestion).   Yes Historical Provider, MD  amLODipine (NORVASC) 10 MG tablet Take 1 tablet (10 mg total) by mouth daily. 04/24/16   Argentina Donovan, PA-C  traMADol (ULTRAM) 50 MG tablet Take 1  tablet (50 mg total) by mouth every 8 (eight) hours as needed. 04/24/16   Argentina Donovan, PA-C     Objective:  EXAM:   Vitals:   04/24/16 1515  BP: (!) 158/97  Pulse: 77  Temp: 98.7 F (37.1 C)  TempSrc: Oral  SpO2: 99%  Weight: 146 lb 12.8 oz (66.6 kg)    General appearance : A&OX3. NAD. Non-toxic-appearing.  Able to ambulate. HEENT: Atraumatic and Normocephalic.   Wearing dark glasses.   Neck: supple, no JVD. No cervical lymphadenopathy. No thyromegaly Chest/Lungs:  Breathing-non-labored, Good air entry bilaterally, breath sounds normal without rales, rhonchi, or wheezing  CVS: S1 S2 regular, no murmurs, gallops, rubs  Extremities: Bilateral Lower Ext shows no edema, both legs are warm to touch with = pulse throughout Neurology:  CN II-XII grossly intact, Non focal.  EOM intact.  Psych:  TP linear. J/I WNL. Normal speech. Appropriate eye contact and affect.  Skin:  No Rash  Data Review Lab Results  Component Value Date   HGBA1C 5.8 04/24/2016   HGBA1C 5.2 01/29/2014     Assessment & Plan   1. Optic neuritis  with recent hospitalization. - Ambulatory referral to Ophthalmology Complete prednisone Rx  2. Elevated blood sugar-likely related to prednisone use Normal HgB A1c  3. Essential hypertension Uncontrolled.  Stop Amlodipine '5mg'$  and increase dose.  - amLODipine (NORVASC) 10 MG tablet; Take 1 tablet (10 mg total) by mouth daily.  Dispense: 90 tablet; Refill: 3 Continue Lisinopril '40mg'$  daily  4. Acute post-traumatic headache, not intractable Unsure if this is post LP vs uncontrolled htn Try tylenol first and is that doesn't work- - traMADol (ULTRAM) 50 MG tablet; Take 1 tablet (50 mg total) by mouth every 8 (eight) hours as needed.  Dispense: 30 tablet; Refill: 0  5. HIV + Keep appt with Dr Johnnye Sima for Monday.  Continue Odefsey.  6.  Anxiety/depression-definitely believe exacerbated right now due to prednisone and recent hospitalization.  Encouraged him to  attend HIV support groups.  Had him meet with our social worker, Christa See today who also offered resources.  Deep breathing techniques, self-care, healthy diet, and rest encouraged, esp while he continues to recover.   There are no acute safety issues.  For today, he wishes to defer until he can get some financial issues figured out, but we can consider counseling or possibly even psychiatry referral in the future if appropriate.  Patient have been counseled extensively about nutrition and exercise.  Compliance with meds imperative  Return in about 3 weeks (around 05/15/2016) for assign PCP; f/up BP and recent hospitalization.  The patient was given clear instructions to go to ER or return to medical center if symptoms don't improve, worsen or new problems develop. The patient verbalized understanding. The patient was told to call to get lab results if they haven't heard anything in the next week.     Freeman Caldron, PA-C [facility] and [place], [place]   04/24/2016, 3:47 PM

## 2016-04-24 NOTE — BH Specialist Note (Signed)
Integrated Behavioral Health Initial Visit  MRN: [id number] Name: Ryan Cruz   Session Start time: 4:30 PM Session End time: 5:00 PM Total time: 30 minutes  Type of Service: [place] Interpretor:No. Interpretor Name and Language: N/A   Warm Hand Off Completed.       SUBJECTIVE: Ryan Cruz is a 51 y.o. male accompanied by patient. Patient was referred by Weyman Pedro for depression and anxiety. Patient reports the following symptoms/concerns: feelings of sadness and worry, difficulty sleeping, irrtitability, and low energy  Duration of problem: 3 months; Severity of problem: moderate  OBJECTIVE: Mood: Anxious and Affect: Appropriate Risk of harm to self or others: No plan to harm self or others   LIFE CONTEXT: Family and Social: Pt resides with wife and their adult son. Pt receives strong emotional support from both parents School/Work: Pt is employed Self-Care: Pt smokes tobacco (3 cigarettes) and marijuana (one joint) daily Life Changes: Pt recently was hospitalized for optic neuritis and has loss vision in his right eye  GOALS ADDRESSED: Patient will reduce symptoms of: anxiety and depression and increase knowledge and/or ability of: coping skills and also: Increase healthy adjustment to current life circumstances and Increase adequate support systems for patient/family   INTERVENTIONS: Solution-Focused Strategies, Supportive Counseling and Psychoeducation and/or Health Education  Standardized Assessments completed: GAD-7, PHQ 2 and PHQ 9  ASSESSMENT: Patient currently experiencing depression and anxiety triggered by ongoing medical concerns. Pt was recently hospitalized for optic neuritis and has loss vision in his right eye. Patient reports feelings of sadness and worry, difficulty sleeping, irrtitability, and low energy. Patient may benefit from psychoeducation, psychotherapy, and medication management. LCSWA educated pt on  the correlation between physical and mental health and encouraged application of healthy coping skills to decrease symptoms. Pt receives strong emotional support from his family. He was able to successfully identify healthy strategies to implement daily to decrease symptoms of depression and anxiety. Pt is not interested in participating in support groups or psychotherapy at present time. LCSWA provided pt community resources for crisis intervention, psychotherapy, and medication management for future reference. Pt was appreciative for information.  PLAN: 1. Follow up with behavioral health clinician on : Pt was encouraged to contact LCSWA if symptoms worsen or fail to improve to schedule behavioral appointments at [facility]. 2. Behavioral recommendations: LCSWA recommends that pt apply healthy coping skills discussed and utilize community resources provided. Pt is encouraged to schedule follow up appointment with LCSWA 3. Referral(s): [place] (LME/Outside Clinic) 4. "From scale of 1-10, how likely are you to follow plan?": 8/10  Rebekah Chesterfield, LCSW 04/28/16 10:05 AM

## 2016-04-27 ENCOUNTER — Ambulatory Visit (INDEPENDENT_AMBULATORY_CARE_PROVIDER_SITE_OTHER): Payer: Self-pay | Admitting: Infectious Diseases

## 2016-04-27 ENCOUNTER — Encounter: Payer: Self-pay | Admitting: Infectious Diseases

## 2016-04-27 ENCOUNTER — Encounter: Payer: Self-pay | Admitting: Physician Assistant

## 2016-04-27 VITALS — BP 139/83 | HR 84 | Temp 99.4°F | Wt 146.0 lb

## 2016-04-27 DIAGNOSIS — B20 Human immunodeficiency virus [HIV] disease: Secondary | ICD-10-CM

## 2016-04-27 DIAGNOSIS — H469 Unspecified optic neuritis: Secondary | ICD-10-CM

## 2016-04-27 NOTE — Telephone Encounter (Signed)
Please advise 

## 2016-04-27 NOTE — Progress Notes (Signed)
   Subjective:    Patient ID: Ryan Cruz, male    DOB: 02/04/1966, 51 y.o.   MRN: [id number]  HPI 51 yo M with hx of HIV+, anal warts, HTN, hypoglycemic episodes.  On odefsy.   He was hospitalized 3-8 to 04-22-16 with loss of vision in his [name]. He was seen by ophtho who dx him with optic neuritis. He had LP which was negative for viral PCRs. He was treated with acv in hospital as well as steroids. He was d/c home on steroid taper.   Today he feels like his vision is "funny', "fuzzy". His [name] hurts when he turns it, feels like his L eye gets tired, he still has headaches (like his head is in a vise), pain in back of his neck. States he has been awake for 3 days. Since on prednisone he has been cleaining his room and bathroom repeatedly.  Has been able to do most ADLs, does not cook for himself.   HIV 1 RNA Quant (copies/mL)  Date Value  11/20/2015 <20  07/01/2015 136,877 (H)  02/28/2015 <20   CD4 T Cell Abs (/uL)  Date Value  04/13/2016 620  11/20/2015 510  07/01/2015 550    Review of Systems  Constitutional: Negative for appetite change and unexpected weight change.  Eyes: Positive for visual disturbance.  Gastrointestinal: Negative for constipation.  Genitourinary: Negative for difficulty urinating.  Neurological: Positive for headaches.  Psychiatric/Behavioral: Positive for sleep disturbance. The patient is hyperactive.   Please see HPI. 12 point ROS o/w (-)  Appt with neuro is pending.     Objective:   Physical Exam  Constitutional: He appears well-developed and well-nourished.  HENT:  Mouth/Throat: No oropharyngeal exudate.  Neck: Neck supple.  Cardiovascular: Normal rate, regular rhythm and normal heart sounds.   Pulmonary/Chest: Effort normal and breath sounds normal.  Abdominal: Soft. Bowel sounds are normal. There is no tenderness. There is no rebound.  Musculoskeletal: He exhibits no edema.  Lymphadenopathy:    He has no cervical adenopathy.         Assessment & Plan:

## 2016-04-27 NOTE — Assessment & Plan Note (Signed)
Has been off ART since d/c He is awaiting [facility] to deliver.  Will recheck his labs at f/u visit.  rtc in 3 months.

## 2016-04-27 NOTE — Assessment & Plan Note (Signed)
He is somewhat better- can see people, colors. Still has some blurriness.  Completes prednisone taper tomorrow.  Will get him back in with neuro/optho.

## 2016-04-28 ENCOUNTER — Encounter: Payer: Self-pay | Admitting: Physician Assistant

## 2016-04-28 NOTE — Telephone Encounter (Signed)
Patient is requesting xanax

## 2016-04-30 ENCOUNTER — Telehealth: Payer: Self-pay | Admitting: *Deleted

## 2016-04-30 NOTE — Telephone Encounter (Signed)
Notified patient of appointment with Dr. Manuella Ghazi at [facility] for 05/05/16 at 9:45 AM. Address given, [place], [place].

## 2016-05-01 ENCOUNTER — Encounter: Payer: Self-pay | Admitting: Infectious Diseases

## 2016-05-01 ENCOUNTER — Other Ambulatory Visit: Payer: Self-pay | Admitting: Infectious Diseases

## 2016-05-01 DIAGNOSIS — H469 Unspecified optic neuritis: Secondary | ICD-10-CM

## 2016-05-01 LAB — B. BURGDORFI ANTIBODIES, CSF

## 2016-05-04 ENCOUNTER — Ambulatory Visit: Payer: Self-pay | Attending: Family Medicine

## 2016-05-05 ENCOUNTER — Encounter: Payer: Self-pay | Admitting: Infectious Diseases

## 2016-05-07 ENCOUNTER — Telehealth: Payer: Self-pay | Admitting: Lab

## 2016-05-07 ENCOUNTER — Telehealth: Payer: Self-pay | Admitting: *Deleted

## 2016-05-07 NOTE — Telephone Encounter (Signed)
Patient asking about the necessity of his neurology referral. Per Dr Johnnye Sima, this is important for him to follow through with.  Referral marked Urgent as patient is currently with symptoms.   RN notified Diane regarding referral status change. Landis Gandy, RN

## 2016-05-07 NOTE — Telephone Encounter (Signed)
I was given this referral today by Sharyn Lull as "Urgent".  The patient is uninsured.  However, in looking at the past referral history, I had spoken with this patient regarding applying for the [place] through [facility] in October 2017.  I reached out to the patient today to see if he had applied.  He said he started the process through [place] with [employer]. He was discharged from the hospital not to long ago and he was given the paperwork to fill out.  I asked him to reach out to [facility] to see how long the process would take but Alonza Smoker is out until Monday.  He said he wasn't getting any better at this point.  I told him if he felt worst, he could call here to speak with a nurse for advice or go to the ED.

## 2016-05-07 NOTE — Telephone Encounter (Signed)
Spoke with [employer].  He will find the money to be seen by Neurology.  Gave him  Neurology's phone number. He will follow up with the [facility] Financial assistance as well.

## 2016-05-13 ENCOUNTER — Encounter: Payer: Self-pay | Admitting: Infectious Diseases

## 2016-05-15 ENCOUNTER — Other Ambulatory Visit (HOSPITAL_COMMUNITY): Payer: Self-pay | Admitting: Family Medicine

## 2016-05-15 ENCOUNTER — Other Ambulatory Visit: Payer: Self-pay | Admitting: Physician Assistant

## 2016-05-15 DIAGNOSIS — G44319 Acute post-traumatic headache, not intractable: Secondary | ICD-10-CM

## 2016-05-15 NOTE — Telephone Encounter (Signed)
Covering provider concern

## 2016-05-18 ENCOUNTER — Ambulatory Visit (INDEPENDENT_AMBULATORY_CARE_PROVIDER_SITE_OTHER): Payer: Self-pay | Admitting: Neurology

## 2016-05-18 ENCOUNTER — Encounter: Payer: Self-pay | Admitting: Infectious Diseases

## 2016-05-18 ENCOUNTER — Other Ambulatory Visit: Payer: Self-pay | Admitting: *Deleted

## 2016-05-18 ENCOUNTER — Encounter: Payer: Self-pay | Admitting: Neurology

## 2016-05-18 VITALS — BP 168/78 | HR 89 | Temp 98.3°F | Resp 16 | Ht 67.5 in | Wt 149.0 lb

## 2016-05-18 DIAGNOSIS — H469 Unspecified optic neuritis: Secondary | ICD-10-CM

## 2016-05-18 DIAGNOSIS — B20 Human immunodeficiency virus [HIV] disease: Secondary | ICD-10-CM

## 2016-05-18 DIAGNOSIS — G629 Polyneuropathy, unspecified: Secondary | ICD-10-CM

## 2016-05-18 MED ORDER — EMTRICITAB-RILPIVIR-TENOFOV AF 200-25-25 MG PO TABS: 1.0000 | ORAL_TABLET | Freq: Every day | ORAL | 3 refills | Status: DC

## 2016-05-18 MED ORDER — TRAMADOL HCL 50 MG PO TABS: ORAL_TABLET | ORAL | 5 refills | Status: DC

## 2016-05-18 MED ORDER — LORAZEPAM 1 MG PO TABS: 1.0000 mg | ORAL_TABLET | ORAL | 0 refills | Status: DC

## 2016-05-18 MED ORDER — GABAPENTIN 300 MG PO CAPS: ORAL_CAPSULE | ORAL | 5 refills | Status: DC

## 2016-05-18 NOTE — Progress Notes (Signed)
NEUROLOGY CONSULTATION NOTE  Ryan Cruz MRN: [id number] DOB: [birth year]  Referring provider: Dr. Bobby Rumpf Primary care provider: Dr. Arnoldo Morale  Reason for consult:  Optic neuritis  Dear Dr Johnnye Sima:  Thank you for your kind referral of Ryan Cruz for consultation of the above symptoms. Although his history is well known to you, please allow me to reiterate it for the purpose of our medical record. Records and images were personally reviewed where available.  HISTORY OF PRESENT ILLNESS: This is a very pleasant 51 year old left-handed man with a history of hypertension, HIV, in his usual state of health until 04/04/16 when he started having bad headaches like someone hit him on the head with a hammer or drove a stake through his head. He went to the ER and was treated for migraine, ESR was normal. He was back in the ER on 2/28 due to continued pain and was diagnosed with shingles. He saw his ophthalmologist and was sent immediately to the ER. He reports that in retrospect, he had been having difficulties with his right eye between Christmas and New Year, he felt like there was something in his eye and he could not focus clearly. Vision was cloudy on and off. He reports vision had always been good until July 2017 when he was told acuity was 20/50 in the right eye and he got new glasses. Vision progressively became worse that he was unable to see out of the right eye the day prior to his admission last March. He was reporting continued right temporal headaches, a fever of 101, and nausea/vomiting. He had an MRI brain and orbits with and without contrast which I personally reviewed, brain MRI did not show any white matter lesions. MRI orbits showed severe right optic neuritis affecting the intraorbital segment through the intracanalicular segment. It was noted this was likely infectious related to HSV given history of shingles, however superimposed reduced diffusion within the optic nerve  concerning for ischemia/infarct potentially secondary to local vasculopathy was also noted. He was treated with IV acyclovir. HSV and VZV PCR were negative. He was also started on IV doxycycline. He had a lumbar puncture with RBC 12 on tube 1, 27 on tube 4; WBC 39 in tube 1, 28 in tube 4, lymphocytic predominance, protein elevated 54, glucose 57, cultures were negative (negative HSV, cryptococcal Ag, VDRL, fungal,AFB). IgG index was elevated 12.3,  There were 2 oligoclonal bands observed in CSF not present in serum (criteria for MS positivity is 4 or more OCB). Bloodwork was negative for EBV, VZV, CMV, Bartonella, , Lyme, RPR, ACE, ANA. HIV viral load was 69,300, CD 4 count 620. He stopped taking HIV medications in December due to financial reasons, he is now back on antiviral treatment. He was evaluated by ID, Neurology, and Ophthalmology during his hospital stay, and was given 5 days of IV Solumedrol and then PO Prednisone taper.   He reports that he initially had pain with eye movements, now this comes and goes. Pain can go down to a 2/10, today it is a 7/10. He has had a constant headache over the frontal region that has not gone away since 3-4 weeks prior to his hospitalization. He describes it a either a pressure or "just there." He was given Tramadol '50mg'$  which did help but he ran out of refills. Tylenol ES makes him sleepy, he has been taking it twice a day for the past month. His vision is still "weird," he feels like  he is looking through a lens that someone smudged vaseline on. He has red desaturation, noticing colors are different, especially reds and oranges (they look brown). Green colors look dark gray. He initially had bad photophobia where he could not look at the TV or phone, right now lights hurt but are bearable. He reports having a follow-up with Ophtho last week and told vision is 20/400 on the right eye. He still has occasional nausea. He denies any prior history of migraines. He used to  have headaches related to elevated BP, which resolved with BP medication. He has occasional pain in his left arm starting his hand and moving up to his shoulder. He feels it coming and and if he sits very still it would not progress. He has stabbing pain in his neck lasting a few minutes. He has right ankle pain that feels like it is on fire with pins and needles for the past year, sometimes lasting all day. He has occasional dizziness and fell one time when he missed a step. He hit a wall and fell a week after his hospital stay because he could not see on the right side. He has some difficulties with depth perception, and sometimes does not have peripheral vision. He feels the neck pain is due to the lumbar puncture, worse when he is sitting for longer periods. He has had urinary changes, he has an urge to urinate, then hesitancy when he gets to the bathroom ("it's a trickle"). He has woken up with urinary incontinence a few times in the past 3 months. He has pain on bowel movements with a change in stool consistency.   PAST MEDICAL HISTORY: Past Medical History:  Diagnosis Date  . Anxiety   . Asthma   . Depression   . HIV (human immunodeficiency virus infection) ([place])   . Hypertension   . Hypoglycemia    sugars drop ion    PAST SURGICAL HISTORY: Past Surgical History:  Procedure Laterality Date  . COLONOSCOPY  02/12/2012   Procedure: COLONOSCOPY;  Surgeon: Leighton Ruff, MD;  Location: [facility];  Service: Endoscopy;  Laterality: N/A;  . HERNIA REPAIR     umbilibcal    MEDICATIONS: Current Outpatient Prescriptions on File Prior to Visit  Medication Sig Dispense Refill  . amLODipine (NORVASC) 10 MG tablet Take 1 tablet (10 mg total) by mouth daily. 90 tablet 3  . lisinopril (PRINIVIL,ZESTRIL) 40 MG tablet Take 1 tablet (40 mg total) by mouth daily. 90 tablet 3  . ondansetron (ZOFRAN ODT) 4 MG disintegrating tablet Take 1 tablet (4 mg total) by mouth every 8 (eight) hours as needed for  nausea or vomiting. 20 tablet 0  . predniSONE (DELTASONE) 5 MG tablet Take 1 tablet (5 mg total) by mouth daily with breakfast.    . pseudoephedrine-acetaminophen (TYLENOL SINUS) 30-500 MG TABS tablet Take 1 tablet by mouth every 4 (four) hours as needed (sinus congestion).    . traMADol (ULTRAM) 50 MG tablet Take 1 tablet (50 mg total) by mouth every 8 (eight) hours as needed. 30 tablet 0   No current facility-administered medications on file prior to visit.     ALLERGIES: No Known Allergies  FAMILY HISTORY: Family History  Problem Relation Age of Onset  . Diabetes Mother   . Hypertension Mother   . Heart disease Mother   . Congestive Heart Failure Mother   . Diabetes Father   . Hypertension Father   . Diabetes Sister   . HIV Sister   .  Hypertension Sister   . Diabetes Brother   . Hypertension Brother   . Diabetes Brother   . Hypertension Brother   . Diabetes Brother   . Hypertension Brother   . Diabetes Brother   . Hypertension Brother     SOCIAL HISTORY: Social History   Social History  . Marital status: Married    Spouse name: N/A  . Number of children: N/A  . Years of education: N/A   Occupational History  . medication aide and hairstylist    Social History Main Topics  . Smoking status: Current Every Day Smoker    Packs/day: 0.20    Years: 15.00    Types: Cigarettes  . Smokeless tobacco: Never Used  . Alcohol use 1.2 oz/week    1 Glasses of wine, 1 Shots of liquor per week     Comment: occasional  . Drug use: Yes    Frequency: 7.0 times per week    Types: Marijuana     Comment: marijuana daily  . Sexual activity: Yes    Partners: Male     Comment: pt. declined condoms   Other Topics Concern  . Not on file   Social History Narrative  . No narrative on file    REVIEW OF SYSTEMS: Constitutional: No fevers, chills, or sweats, no generalized fatigue, change in appetite Eyes: No visual changes, double vision, eye pain Ear, nose and throat: No  hearing loss, ear pain, nasal congestion, sore throat Cardiovascular: No chest pain, palpitations Respiratory:  No shortness of breath at rest or with exertion, wheezes GastrointestinaI: No nausea, vomiting, diarrhea, abdominal pain, fecal incontinence Genitourinary:  No dysuria, urinary retention or frequency Musculoskeletal:  + neck pain, back pain Integumentary: No rash, pruritus, skin lesions Neurological: as above Psychiatric: No depression, insomnia, +anxiety Endocrine: No palpitations, fatigue, diaphoresis, mood swings, change in appetite, change in weight, increased thirst Hematologic/Lymphatic:  No anemia, purpura, petechiae. Allergic/Immunologic: no itchy/runny eyes, nasal congestion, recent allergic reactions, rashes  PHYSICAL EXAM: Vitals:   05/18/16 1319  BP: (!) 168/78  Pulse: 89  Resp: 16  Temp: 98.3 F (36.8 C)   General: No acute distress, wearing dark glasses in the office due to photophobia Head:  Normocephalic/atraumatic Eyes: very photophobic, unable to visualize fundi Neck: supple, no paraspinal tenderness, full range of motion Back: No paraspinal tenderness Heart: regular rate and rhythm Lungs: Clear to auscultation bilaterally. Vascular: No carotid bruits. Skin/Extremities: No rash, no edema Neurological Exam: Mental status: alert and oriented to person, place, and time, no dysarthria or aphasia, Fund of knowledge is appropriate.  Recent and remote memory are intact.  Attention and concentration are normal.    Able to name objects and repeat phrases. Cranial nerves: CN I: not tested CN II: pupils equal, round and reactive to light, with +APD on right, VA 20/70 OD, 20/100 OS, visual fields intact. Patient very light sensitive, unable to visualize fundi. +red desaturation on right CN III, IV, VI:  full range of motion, no nystagmus, no ptosis CN V: facial sensation intact CN VII: upper and lower face symmetric CN VIII: hearing intact to finger rub CN IX,  X: gag intact, uvula midline CN XI: sternocleidomastoid and trapezius muscles intact CN XII: tongue midline Bulk & Tone: normal, no fasciculations. Motor: 5/5 throughout with no pronator drift. Sensation: decreased cold on right inner calf and foot, otherwise intact to light touch, pin, vibration and joint position sense.  No extinction to double simultaneous stimulation.  Romberg test negative Deep Tendon Reflexes:  brisk +2 throughout except for absent ankle jerks bilaterally, no ankle clonus, negative Hoffman sign Plantar responses: downgoing bilaterally Cerebellar: no incoordination on finger to nose, heel to shin. No dysdiadochokinesia Gait: narrow-based and steady, able to tandem walk adequately. Tremor: none  IMPRESSION: This is a very pleasant 51 year old left-handed man with a history of hypertension, HIV, who presented with right eye pain, headaches, and right optic neuritis with vision changes on the right eye last 04/04/16. MRI brain showed severe right optic neuritis with a long lesion on MRI (poorer visual outcome). Considerations included inflammatory versus infections, with elevated RBC, WBC, and protein. Infectious workup negative. He received IV steroids x 5 days. It was also noted that HIV can have vasculopathy leading to ischemic optic neuropathy. No HIV retinopathy on Ophtho exam. He is back on HAART treatment. He has had slow improvement in vision, today it is 20/100 on the right eye, but continues to have a lot of pain on eye movements and constant headaches. We discussed MRI brain findings, no white matter lesions seen, there were 2 oligoclonal bands in CSF. No clear indication to start disease modifying therapy for MS at this time, however MRI cervical and thoracic spine with and without contrast will be ordered to assess for spinal lesions. Symptomatic treatment for headaches will be started, he will start gabapentin '300mg'$  qhs x 1 week, then increase to BID. He may take prn  Tramadol and knows to minimize any rescue medication to 2-3 a week to avoid rebound headaches. He will follow-up in 2 months and knows to call for any changes.   Thank you for allowing me to participate in the care of this patient. Please do not hesitate to call for any questions or concerns.   Ellouise Newer, M.D.  CC: Dr. Johnnye Sima, Dr. Jarold Song

## 2016-05-18 NOTE — Patient Instructions (Addendum)
1. Schedule MRI cervical spine with and without contrast 2. Schedule MRI thoracic spine with and without contrast 3. Start taking Gabapentin 300mg : Take 1 capsule at night for 1 week, then increase to 1 capsule twice a week 4. Take Tramadol 50mg  every 12 hours as needed, do not take more than 2-3 times a week if possible 5. Follow-up in 2 months, call for any changes

## 2016-05-19 ENCOUNTER — Encounter: Payer: Self-pay | Admitting: Neurology

## 2016-05-19 ENCOUNTER — Other Ambulatory Visit: Payer: Self-pay | Admitting: Physician Assistant

## 2016-05-19 DIAGNOSIS — G44319 Acute post-traumatic headache, not intractable: Secondary | ICD-10-CM

## 2016-05-19 DIAGNOSIS — H469 Unspecified optic neuritis: Secondary | ICD-10-CM

## 2016-05-19 LAB — FUNGAL ORGANISM REFLEX

## 2016-05-19 LAB — FUNGUS CULTURE RESULT

## 2016-05-19 LAB — FUNGUS CULTURE WITH STAIN

## 2016-05-23 ENCOUNTER — Encounter: Payer: Self-pay | Admitting: Neurology

## 2016-05-26 ENCOUNTER — Encounter: Payer: Self-pay | Admitting: Family Medicine

## 2016-05-26 ENCOUNTER — Ambulatory Visit: Payer: Self-pay | Attending: Family Medicine | Admitting: Family Medicine

## 2016-05-26 VITALS — BP 131/67 | HR 64 | Temp 98.4°F | Resp 18 | Ht 67.0 in | Wt 153.2 lb

## 2016-05-26 DIAGNOSIS — G4489 Other headache syndrome: Secondary | ICD-10-CM | POA: Insufficient documentation

## 2016-05-26 DIAGNOSIS — Z0001 Encounter for general adult medical examination with abnormal findings: Secondary | ICD-10-CM | POA: Insufficient documentation

## 2016-05-26 DIAGNOSIS — Z21 Asymptomatic human immunodeficiency virus [HIV] infection status: Secondary | ICD-10-CM | POA: Insufficient documentation

## 2016-05-26 DIAGNOSIS — B20 Human immunodeficiency virus [HIV] disease: Secondary | ICD-10-CM

## 2016-05-26 DIAGNOSIS — H469 Unspecified optic neuritis: Secondary | ICD-10-CM | POA: Insufficient documentation

## 2016-05-26 DIAGNOSIS — I10 Essential (primary) hypertension: Secondary | ICD-10-CM | POA: Insufficient documentation

## 2016-05-26 DIAGNOSIS — Z9889 Other specified postprocedural states: Secondary | ICD-10-CM | POA: Insufficient documentation

## 2016-05-26 DIAGNOSIS — Z79899 Other long term (current) drug therapy: Secondary | ICD-10-CM | POA: Insufficient documentation

## 2016-05-26 NOTE — Progress Notes (Addendum)
Subjective:  Patient ID: Ryan Cruz, male    DOB: 1965/04/19  Age: 51 y.o. MRN: [id number]  CC: Establish Care   HPI Ryan Cruz is a 51 year old male with a history of HIV (managed by infectious disease), hypertension, optic neuritis (recent hospitalization from 3/8 through 04/22/16) who comes into the clinic to establish care.  He endorses compliance with his antihypertensives but is yet to take it this morning as he usually takes it around the time of this appointment. He informs me that he has had uncontrolled blood pressures in the past but does not adhere to a low-sodium diet  He was treated with Solu-Medrol during his hospitalization and was closely followed by ophthalmology, neurology and infectious disease. He had a hospital follow-up with ophthalmology-Dr Brigitte Pulse whom he does not want to see anymore. Recently seen by [facility] neurology for headaches and optic neuritis, MRI cervical and thoracic spine ordered and he was placed on tramadol and gabapentin. Visual symptoms have improved as he had complete loss of vision during his hospitalization but now can see as if "through a lens  Smudged with Vaseline" but he does have some loss of temporal vision and problems identifying colors. Rates his headache is a 9.5 out of 10, denies nausea or vomiting and has no eye pain.  Past Medical History:  Diagnosis Date  . Anxiety   . Asthma   . Depression   . HIV (human immunodeficiency virus infection) ([place])   . Hypertension   . Hypoglycemia    sugars drop ion    Past Surgical History:  Procedure Laterality Date  . COLONOSCOPY  02/12/2012   Procedure: COLONOSCOPY;  Surgeon: Leighton Ruff, MD;  Location: [facility];  Service: Endoscopy;  Laterality: N/A;  . HERNIA REPAIR     umbilibcal    No Known Allergies   Outpatient Medications Prior to Visit  Medication Sig Dispense Refill  . amLODipine (NORVASC) 10 MG tablet Take 1 tablet (10 mg total) by mouth daily. 90 tablet 3  .  emtricitabine-rilpivir-tenofovir AF (ODEFSEY) 200-25-25 MG TABS tablet Take 1 tablet by mouth daily with breakfast. 30 tablet 3  . gabapentin (NEURONTIN) 300 MG capsule Take 1 capsule at night for 1 week, then increase to 1 capsule twice a day 60 capsule 5  . lisinopril (PRINIVIL,ZESTRIL) 40 MG tablet Take 1 tablet (40 mg total) by mouth daily. 90 tablet 3  . LORazepam (ATIVAN) 1 MG tablet Take 1 tablet (1 mg total) by mouth as directed. Take medication with you to have MRI done. You will be instructed when to take medication. 2 tablet 0  . pseudoephedrine-acetaminophen (TYLENOL SINUS) 30-500 MG TABS tablet Take 1 tablet by mouth every 4 (four) hours as needed (sinus congestion).    . traMADol (ULTRAM) 50 MG tablet Take 1 tablet every 12 hours as needed for severe pain 30 tablet 5   No facility-administered medications prior to visit.     ROS Review of Systems  Constitutional: Negative for activity change and appetite change.  HENT: Negative for sinus pressure and sore throat.   Eyes:       See hpi  Respiratory: Negative for cough, chest tightness and shortness of breath.   Cardiovascular: Negative for chest pain and leg swelling.  Gastrointestinal: Negative for abdominal distention, abdominal pain, constipation and diarrhea.  Endocrine: Negative.   Genitourinary: Negative for dysuria.  Musculoskeletal: Negative for joint swelling and myalgias.  Skin: Negative for rash.  Allergic/Immunologic: Negative.   Neurological: Positive for  headaches. Negative for weakness, light-headedness and numbness.  Psychiatric/Behavioral: Negative for dysphoric mood and suicidal ideas.    Objective:  BP 131/67 (BP Location: Left Arm, Patient Position: Sitting, Cuff Size: Normal)   Pulse 64   Temp 98.4 F (36.9 C) (Oral)   Resp 18   Ht 5\' 7"  (1.702 m)   Wt 153 lb 3.2 oz (69.5 kg)   SpO2 100%   BMI 23.99 kg/m   BP/Weight 05/26/2016 05/18/2016 03/23/9415  Systolic BP 408 144 818  Diastolic BP 67 78 83   Wt. (Lbs) 153.2 149 146  BMI 23.99 22.99 22.2      Physical Exam  Constitutional: He is oriented to person, place, and time. He appears well-developed and well-nourished.  HENT:  Head: Normocephalic and atraumatic.  Right Ear: External ear normal.  Left Ear: External ear normal.  Neck: Normal range of motion. Neck supple. No tracheal deviation present.  Cardiovascular: Normal rate, regular rhythm and normal heart sounds.   No murmur heard. Pulmonary/Chest: Effort normal and breath sounds normal. No respiratory distress. He has no wheezes. He exhibits no tenderness.  Abdominal: Soft. Bowel sounds are normal. He exhibits no mass. There is no tenderness.  Musculoskeletal: Normal range of motion. He exhibits no edema or tenderness.  Neurological: He is alert and oriented to person, place, and time.  Skin: Skin is warm and dry.  Psychiatric: He has a normal mood and affect.     Assessment & Plan:   1. Optic neuritis Improving Patient advised that he would need to be followed closely by ophthalmology I have informed him about [facility] however he needs to call the office and set up a payment plan with them as he has no medical coverage  2. HIV (human immunodeficiency virus infection) ([place]) Antiretroviral therapy as per ID Keep appointments with infectious disease  3. Essential hypertension Controlled Keep home blood pressure log which will be reviewed at next visit Low-sodium diet  4. Other headache syndrome Continue tramadol and gabapentin as per neurology Advised to keep appointment for cervical and thoracic MRI.   No orders of the defined types were placed in this encounter.   Follow-up: Return in about 1 month (around 06/25/2016) for follow up on hypertension.   Arnoldo Morale MD

## 2016-05-26 NOTE — Progress Notes (Signed)
Patient is here for FU  Patient complains of a headache beginning yesterday morning and staying present until current moment. Pain is scaled at a 9 currently.  Patient has not taken medication today. Patient has not eaten today.

## 2016-05-26 NOTE — Patient Instructions (Signed)

## 2016-05-27 ENCOUNTER — Telehealth: Payer: Self-pay | Admitting: Family Medicine

## 2016-05-27 ENCOUNTER — Ambulatory Visit (HOSPITAL_COMMUNITY): Payer: Self-pay

## 2016-05-27 ENCOUNTER — Ambulatory Visit (HOSPITAL_COMMUNITY)
Admission: RE | Admit: 2016-05-27 | Discharge: 2016-05-27 | Disposition: A | Discharge status: Home / Self Care | Payer: Self-pay | Source: Ambulatory Visit | Attending: Neurology | Admitting: Neurology

## 2016-05-27 ENCOUNTER — Encounter (HOSPITAL_COMMUNITY): Payer: Self-pay

## 2016-05-27 NOTE — Telephone Encounter (Signed)
The test was ordered by his neurologist. He will need to get in touch with them regarding this.

## 2016-05-27 NOTE — Telephone Encounter (Signed)
Patient called the office asking to speak with nurse or PCP in regards to the MRI that he got this morning. Pt stated that he has really bad head pain while and after he was put in the machine. Pt is very concerned. Please follow up.  Thank you

## 2016-05-27 NOTE — Telephone Encounter (Signed)
Pt called stating that he was unable to complete MRI due to sounds creating worse pain in head, requests another appt for MRI but with anesthesia. Unable to complete MRI while awake.

## 2016-05-27 NOTE — Telephone Encounter (Signed)
Patient sees [facility] for headaches and will need to get in touch with them.

## 2016-05-28 NOTE — Telephone Encounter (Signed)
Writer called patient back and LVM regarding patient f/u with [facility] neurology in reference to his headache or head pain following the MRI. Patient was encouraged to call back with any questions.

## 2016-05-28 NOTE — Telephone Encounter (Signed)
Left message on voicemail, pt would need to call neurologist office regarding test. Informed to call office for other questions.

## 2016-06-01 LAB — ACID FAST CULTURE WITH REFLEXED SENSITIVITIES (MYCOBACTERIA): Acid Fast Culture: NEGATIVE

## 2016-06-23 ENCOUNTER — Ambulatory Visit: Payer: Self-pay | Admitting: Family Medicine

## 2016-06-24 ENCOUNTER — Encounter: Payer: Self-pay | Admitting: Infectious Diseases

## 2016-06-27 ENCOUNTER — Encounter: Payer: Self-pay | Admitting: Family Medicine

## 2016-07-20 ENCOUNTER — Emergency Department (HOSPITAL_COMMUNITY)
Admission: EM | Admit: 2016-07-20 | Discharge: 2016-07-20 | Disposition: A | Discharge status: Home / Self Care | Payer: Self-pay | Attending: Emergency Medicine | Admitting: Emergency Medicine

## 2016-07-20 ENCOUNTER — Emergency Department (HOSPITAL_COMMUNITY): Payer: Self-pay

## 2016-07-20 ENCOUNTER — Encounter (HOSPITAL_COMMUNITY): Payer: Self-pay | Admitting: Obstetrics and Gynecology

## 2016-07-20 ENCOUNTER — Telehealth: Payer: Self-pay | Admitting: *Deleted

## 2016-07-20 DIAGNOSIS — I10 Essential (primary) hypertension: Secondary | ICD-10-CM | POA: Insufficient documentation

## 2016-07-20 DIAGNOSIS — H5711 Ocular pain, right eye: Secondary | ICD-10-CM | POA: Insufficient documentation

## 2016-07-20 DIAGNOSIS — R51 Headache: Secondary | ICD-10-CM | POA: Insufficient documentation

## 2016-07-20 DIAGNOSIS — F17210 Nicotine dependence, cigarettes, uncomplicated: Secondary | ICD-10-CM | POA: Insufficient documentation

## 2016-07-20 DIAGNOSIS — J45909 Unspecified asthma, uncomplicated: Secondary | ICD-10-CM | POA: Insufficient documentation

## 2016-07-20 DIAGNOSIS — H5710 Ocular pain, unspecified eye: Secondary | ICD-10-CM

## 2016-07-20 DIAGNOSIS — R519 Headache, unspecified: Secondary | ICD-10-CM

## 2016-07-20 DIAGNOSIS — Z79899 Other long term (current) drug therapy: Secondary | ICD-10-CM | POA: Insufficient documentation

## 2016-07-20 LAB — BASIC METABOLIC PANEL
Anion gap: 8 (ref 5–15)
BUN: 12 mg/dL (ref 6–20)
CO2: 26 mmol/L (ref 22–32)
Calcium: 9.9 mg/dL (ref 8.9–10.3)
Chloride: 104 mmol/L (ref 101–111)
Creatinine, Ser: 1.08 mg/dL (ref 0.61–1.24)
GFR calc Af Amer: >60 mL/min (ref >60)
GFR calc non Af Amer: >60 mL/min (ref >60)
Glucose, Bld: 97 mg/dL (ref 65–99)
Potassium: 3.6 mmol/L (ref 3.5–5.1)
Sodium: 138 mmol/L (ref 135–145)

## 2016-07-20 LAB — CBC WITH DIFFERENTIAL/PLATELET
Basophils Absolute: 0 10*3/uL (ref 0.0–0.1)
Basophils Relative: 0 %
Eosinophils Absolute: 0.1 10*3/uL (ref 0.0–0.7)
Eosinophils Relative: 2 %
HCT: 40.5 % (ref 39.0–52.0)
Hemoglobin: 14 g/dL (ref 13.0–17.0)
Lymphocytes Relative: 40 %
Lymphs Abs: 2.3 10*3/uL (ref 0.7–4.0)
MCH: 32.5 pg (ref 26.0–34.0)
MCHC: 34.6 g/dL (ref 30.0–36.0)
MCV: 94 fL (ref 78.0–100.0)
Monocytes Absolute: 0.4 10*3/uL (ref 0.1–1.0)
Monocytes Relative: 6 %
Neutro Abs: 2.9 10*3/uL (ref 1.7–7.7)
Neutrophils Relative %: 52 %
Platelets: 337 10*3/uL (ref 150–400)
RBC: 4.31 MIL/uL (ref 4.22–5.81)
RDW: 14.1 % (ref 11.5–15.5)
WBC: 5.7 10*3/uL (ref 4.0–10.5)

## 2016-07-20 MED ORDER — FLUORESCEIN SODIUM 0.6 MG OP STRP
1.0000 | ORAL_STRIP | Freq: Once | OPHTHALMIC | Status: DC
  Filled 2016-07-20: qty 1

## 2016-07-20 MED ORDER — LORAZEPAM 2 MG/ML IJ SOLN
1.0000 mg | Freq: Once | INTRAMUSCULAR | Status: AC
  Administered 2016-07-20: 1 mg via INTRAVENOUS
  Filled 2016-07-20: qty 1

## 2016-07-20 MED ORDER — KETOROLAC TROMETHAMINE 30 MG/ML IJ SOLN
30.0000 mg | Freq: Once | INTRAMUSCULAR | Status: AC
  Administered 2016-07-20: 30 mg via INTRAVENOUS
  Filled 2016-07-20: qty 1

## 2016-07-20 MED ORDER — TETRACAINE HCL 0.5 % OP SOLN
1.0000 [drp] | Freq: Once | OPHTHALMIC | Status: DC
  Filled 2016-07-20: qty 4

## 2016-07-20 MED ORDER — METOCLOPRAMIDE HCL 5 MG/ML IJ SOLN
10.0000 mg | Freq: Once | INTRAMUSCULAR | Status: AC
  Administered 2016-07-20: 10 mg via INTRAVENOUS
  Filled 2016-07-20: qty 2

## 2016-07-20 MED ORDER — DIPHENHYDRAMINE HCL 50 MG/ML IJ SOLN
25.0000 mg | Freq: Once | INTRAMUSCULAR | Status: AC
  Administered 2016-07-20: 25 mg via INTRAVENOUS
  Filled 2016-07-20: qty 1

## 2016-07-20 NOTE — ED Notes (Signed)
Pt to MRI

## 2016-07-20 NOTE — Telephone Encounter (Signed)
Received voice mail from patient stating he is having "throbbing pain" in his eye. Spoke to Dr. Johnnye Sima and he advised that patient should follow up with opthalmology or neurology. Noticed that he had an appt with Neuro on 07/23/16 and that appt has been cancelled. Attempted to call patient back and got his voice mail. Left voice mail with this information and advised that he should call Neuro back and see if he can get in. Myrtis Hopping

## 2016-07-20 NOTE — ED Notes (Signed)
Pt back from MRI, pt could not tolerate again (2 attempts)

## 2016-07-20 NOTE — ED Notes (Signed)
Pt resting with eyes closed. Equal and unlabored respirations

## 2016-07-20 NOTE — Discharge Instructions (Addendum)
Please read attached information. If you experience any new or worsening signs or symptoms please return to the emergency room for evaluation. Please follow-up with your primary care provider or specialist as discussed.  °
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
## 2016-07-20 NOTE — ED Triage Notes (Signed)
Pt reports eye pain. Pt states he has a hx of optic nerve swelling and has had hospitalization for in in march. Pt states he is having severe pain in the right eye and swollen eye

## 2016-07-20 NOTE — ED Notes (Signed)
Pt back from MRI, MRI tech stated that "pt tolerated about 20 seconds before he demanded to come out of the machine". Merry Proud PA notified

## 2016-07-23 ENCOUNTER — Ambulatory Visit: Payer: Self-pay | Admitting: Neurology

## 2016-08-06 ENCOUNTER — Ambulatory Visit: Payer: Self-pay | Admitting: Neurology

## 2016-08-06 DIAGNOSIS — Z029 Encounter for administrative examinations, unspecified: Secondary | ICD-10-CM

## 2016-08-07 ENCOUNTER — Encounter: Payer: Self-pay | Admitting: Neurology

## 2016-09-28 ENCOUNTER — Encounter: Payer: Self-pay | Admitting: Infectious Diseases

## 2016-09-29 ENCOUNTER — Other Ambulatory Visit: Payer: Self-pay | Admitting: *Deleted

## 2016-09-29 DIAGNOSIS — B20 Human immunodeficiency virus [HIV] disease: Secondary | ICD-10-CM

## 2016-09-29 MED ORDER — EMTRICITAB-RILPIVIR-TENOFOV AF 200-25-25 MG PO TABS: 1.0000 | ORAL_TABLET | Freq: Every day | ORAL | 3 refills | Status: DC

## 2016-10-07 ENCOUNTER — Encounter: Payer: Self-pay | Admitting: Infectious Diseases

## 2016-10-13 ENCOUNTER — Encounter: Payer: Self-pay | Admitting: Family Medicine

## 2016-10-13 ENCOUNTER — Ambulatory Visit: Payer: No Typology Code available for payment source | Attending: Family Medicine | Admitting: Family Medicine

## 2016-10-13 VITALS — BP 143/89 | HR 61 | Temp 98.3°F | Ht 67.0 in | Wt 145.8 lb

## 2016-10-13 DIAGNOSIS — Z5321 Procedure and treatment not carried out due to patient leaving prior to being seen by health care provider: Secondary | ICD-10-CM | POA: Insufficient documentation

## 2016-10-13 DIAGNOSIS — K5909 Other constipation: Secondary | ICD-10-CM

## 2016-10-13 NOTE — Progress Notes (Signed)
Patient left prior to being seen by clinician.

## 2016-11-18 ENCOUNTER — Ambulatory Visit (INDEPENDENT_AMBULATORY_CARE_PROVIDER_SITE_OTHER): Payer: No Typology Code available for payment source | Admitting: Infectious Diseases

## 2016-11-18 ENCOUNTER — Other Ambulatory Visit (HOSPITAL_COMMUNITY)
Admission: RE | Admit: 2016-11-18 | Discharge: 2016-11-18 | Disposition: A | Discharge status: Home / Self Care | Payer: No Typology Code available for payment source | Source: Ambulatory Visit | Attending: Infectious Diseases | Admitting: Infectious Diseases

## 2016-11-18 ENCOUNTER — Encounter: Payer: Self-pay | Admitting: Infectious Diseases

## 2016-11-18 VITALS — BP 126/79 | HR 67 | Temp 98.4°F | Wt 149.1 lb

## 2016-11-18 DIAGNOSIS — Z113 Encounter for screening for infections with a predominantly sexual mode of transmission: Secondary | ICD-10-CM | POA: Diagnosis not present

## 2016-11-18 DIAGNOSIS — Z79899 Other long term (current) drug therapy: Secondary | ICD-10-CM

## 2016-11-18 DIAGNOSIS — Z23 Encounter for immunization: Secondary | ICD-10-CM

## 2016-11-18 DIAGNOSIS — B20 Human immunodeficiency virus [HIV] disease: Secondary | ICD-10-CM | POA: Diagnosis not present

## 2016-11-18 DIAGNOSIS — A630 Anogenital (venereal) warts: Secondary | ICD-10-CM | POA: Diagnosis not present

## 2016-11-18 DIAGNOSIS — Z789 Other specified health status: Secondary | ICD-10-CM | POA: Diagnosis not present

## 2016-11-18 DIAGNOSIS — E162 Hypoglycemia, unspecified: Secondary | ICD-10-CM | POA: Diagnosis not present

## 2016-11-18 DIAGNOSIS — F17200 Nicotine dependence, unspecified, uncomplicated: Secondary | ICD-10-CM

## 2016-11-18 NOTE — Assessment & Plan Note (Signed)
Doing well on his current art Gets flu shot today. vax are up to date.  Offered/refused condoms.  rtc in 9 months.

## 2016-11-18 NOTE — Assessment & Plan Note (Signed)
Has continued episodes. Checks FSG at work- 26/50. Improves with food.

## 2016-11-18 NOTE — Assessment & Plan Note (Addendum)
Feels like he has bump at os. Hurts with moving bowels.  Will have him seen by [facility].

## 2016-11-18 NOTE — Progress Notes (Signed)
   Subjective:    Patient ID: Ryan Cruz, male    DOB: September 08, 1965, 51 y.o.   MRN: [id number]  HPI 51yo M with hx of HIV+, anal warts, HTN, hypoglycemic episodes.  On odefsy.  He was hospitalized 3-8 to 04-22-16 with loss of vision in his [name]. He was seen by ophtho who dx him with optic neuritis. He has since had persistent visual loss. Has ophtho f/u next week.  Has been having worsening sinus sx after getting new air freshener at his house.  No problems with ART.  Was having difficulty with hemorrhoids, constipation. Has improved with increased fiber, water intake.   HIV 1 RNA Quant (copies/mL)  Date Value  11/20/2015 <20  07/01/2015 136,877 (H)  02/28/2015 <20   CD4 T Cell Abs (/uL)  Date Value  04/13/2016 620  11/20/2015 510  07/01/2015 550    Review of Systems  Constitutional: Negative for appetite change, chills, fever and unexpected weight change.  Eyes: Positive for visual disturbance.  Gastrointestinal: Positive for constipation. Negative for diarrhea.  Genitourinary: Negative for difficulty urinating.  Neurological: Negative for headaches.  Psychiatric/Behavioral: Negative for sleep disturbance.  Please see HPI. All other systems reviewed and negative.      Objective:   Physical Exam  Constitutional: He appears well-developed and well-nourished.  HENT:  Mouth/Throat: No oropharyngeal exudate.  Eyes: Pupils are equal, round, and reactive to light. EOM are normal.  Neck: Neck supple.  Cardiovascular: Normal rate, regular rhythm and normal heart sounds.   Pulmonary/Chest: Effort normal and breath sounds normal.  Abdominal: Soft. Bowel sounds are normal. There is no tenderness. There is no rebound.  Genitourinary:     Musculoskeletal: He exhibits no edema.  Lymphadenopathy:    He has no cervical adenopathy.  Psychiatric: He has a normal mood and affect.      Assessment & Plan:

## 2016-11-18 NOTE — Assessment & Plan Note (Signed)
Encouraged to quit, he is aware 3-4 cig /day.

## 2016-11-19 LAB — COMPREHENSIVE METABOLIC PANEL
AG RATIO: 1.6 (calc) (ref 1.0–2.5)
ALBUMIN MSPROF: 4.1 g/dL (ref 3.6–5.1)
ALT: 15 U/L (ref 9–46)
AST: 13 U/L (ref 10–40)
Alkaline phosphatase (APISO): 56 U/L (ref 40–115)
BILIRUBIN TOTAL: 0.5 mg/dL (ref 0.2–1.2)
BUN: 13 mg/dL (ref 7–25)
CALCIUM: 9.1 mg/dL (ref 8.6–10.3)
CO2: 27 mmol/L (ref 20–32)
Chloride: 108 mmol/L (ref 98–110)
Creat: 1.25 mg/dL (ref 0.60–1.35)
GLUCOSE: 80 mg/dL (ref 65–99)
Globulin: 2.6 g/dL (calc) (ref 1.9–3.7)
Potassium: 4.2 mmol/L (ref 3.5–5.3)
SODIUM: 142 mmol/L (ref 135–146)
TOTAL PROTEIN: 6.7 g/dL (ref 6.1–8.1)

## 2016-11-19 LAB — CBC
HEMATOCRIT: 34.4 % — AB (ref 38.5–50.0)
HEMOGLOBIN: 12 g/dL — AB (ref 13.2–17.1)
MCH: 31.8 pg (ref 27.0–33.0)
MCHC: 34.9 g/dL (ref 32.0–36.0)
MCV: 91.2 fL (ref 80.0–100.0)
MPV: 10.2 fL (ref 7.5–12.5)
Platelets: 288 10*3/uL (ref 140–400)
RBC: 3.77 10*6/uL — ABNORMAL LOW (ref 4.20–5.80)
RDW: 13.3 % (ref 11.0–15.0)
WBC: 7.3 10*3/uL (ref 3.8–10.8)

## 2016-11-19 LAB — T-HELPER CELL (CD4) - (RCID CLINIC ONLY)
CD4 T CELL HELPER: 18 % — AB (ref 33–55)
CD4 T Cell Abs: 470 /uL (ref 400–2700)

## 2016-11-19 LAB — LIPID PANEL
Cholesterol: 174 mg/dL (ref <200)
HDL: 42 mg/dL (ref >40)
LDL Cholesterol (Calc): 100 mg/dL (calc) — ABNORMAL HIGH
NON-HDL CHOLESTEROL (CALC): 132 mg/dL — AB (ref <130)
Total CHOL/HDL Ratio: 4.1 (calc) (ref <5.0)
Triglycerides: 196 mg/dL — ABNORMAL HIGH (ref <150)

## 2016-11-19 LAB — URINE CYTOLOGY ANCILLARY ONLY
Chlamydia: NEGATIVE
NEISSERIA GONORRHEA: NEGATIVE

## 2016-11-19 LAB — RPR: RPR Ser Ql: NONREACTIVE

## 2016-11-20 ENCOUNTER — Encounter: Payer: Self-pay | Admitting: Infectious Diseases

## 2016-11-20 LAB — HIV-1 RNA QUANT-NO REFLEX-BLD
HIV 1 RNA Quant: 43 copies/mL — ABNORMAL HIGH
HIV-1 RNA QUANT, LOG: 1.63 {Log_copies}/mL — AB

## 2017-04-29 ENCOUNTER — Other Ambulatory Visit: Payer: Self-pay | Admitting: *Deleted

## 2017-04-29 DIAGNOSIS — B20 Human immunodeficiency virus [HIV] disease: Secondary | ICD-10-CM

## 2017-05-05 ENCOUNTER — Other Ambulatory Visit: Payer: No Typology Code available for payment source

## 2017-05-17 ENCOUNTER — Ambulatory Visit: Payer: No Typology Code available for payment source | Admitting: Infectious Diseases

## 2017-05-19 ENCOUNTER — Ambulatory Visit: Payer: No Typology Code available for payment source | Admitting: Infectious Diseases

## 2017-10-21 ENCOUNTER — Ambulatory Visit: Payer: No Typology Code available for payment source

## 2017-10-21 ENCOUNTER — Encounter: Payer: Self-pay | Admitting: Family

## 2017-10-21 ENCOUNTER — Ambulatory Visit (INDEPENDENT_AMBULATORY_CARE_PROVIDER_SITE_OTHER): Payer: No Typology Code available for payment source | Admitting: Family

## 2017-10-21 ENCOUNTER — Ambulatory Visit (INDEPENDENT_AMBULATORY_CARE_PROVIDER_SITE_OTHER): Payer: No Typology Code available for payment source | Admitting: Licensed Clinical Social Worker

## 2017-10-21 VITALS — BP 184/105 | HR 75 | Temp 98.5°F | Wt 149.8 lb

## 2017-10-21 DIAGNOSIS — I10 Essential (primary) hypertension: Secondary | ICD-10-CM

## 2017-10-21 DIAGNOSIS — Z23 Encounter for immunization: Secondary | ICD-10-CM | POA: Diagnosis not present

## 2017-10-21 DIAGNOSIS — F331 Major depressive disorder, recurrent, moderate: Secondary | ICD-10-CM

## 2017-10-21 DIAGNOSIS — B20 Human immunodeficiency virus [HIV] disease: Secondary | ICD-10-CM | POA: Diagnosis not present

## 2017-10-21 MED ORDER — LISINOPRIL 40 MG PO TABS: 40.0000 mg | ORAL_TABLET | Freq: Every day | ORAL | 1 refills | Status: DC

## 2017-10-21 MED ORDER — AMLODIPINE BESYLATE 10 MG PO TABS: 10.0000 mg | ORAL_TABLET | Freq: Every day | ORAL | 1 refills | Status: DC

## 2017-10-21 MED ORDER — EMTRICITAB-RILPIVIR-TENOFOV AF 200-25-25 MG PO TABS: 1.0000 | ORAL_TABLET | Freq: Every day | ORAL | 1 refills | Status: DC

## 2017-10-21 NOTE — Patient Instructions (Signed)
Nice to meet you.  We will check your blood work today.  Please restart taking your previous medications.  Plan for follow up in 1 week through nurse or office visit for blood pressure check.  To prevent wax buildup within the ear:   Use equal parts of water and white vinegar  Soak a cotton ball in the solution and place several drops within the ear  Insert cotton ball in external ear canal and let sit for 30 minutes prior to shower  Remove cotton ball and gently irrigate the ear canal in the shower.  Do not irrigate directly into the ear but rather let it hit the external canal and irrigate.  For maintenance, this can be done 1 time weekly.   General Recommendations:    Please drink plenty of fluids.  Get plenty of rest   Sleep in humidified air  Use saline nasal sprays  Netti pot   OTC Medications:  Decongestants - helps relieve congestion   Flonase (generic fluticasone) or Nasacort (generic triamcinolone) - please make sure to use the "cross-over" technique at a 45 degree angle towards the opposite eye as opposed to straight up the nasal passageway.   Sudafed (generic pseudoephedrine - Note this is the one that is available behind the pharmacy counter); Products with phenylephrine (-PE) may also be used but is often not as effective as pseudoephedrine.   If you have HIGH BLOOD PRESSURE - Coricidin HBP; AVOID any product that is -D as this contains pseudoephedrine which may increase your blood pressure.  Afrin (oxymetazoline) every 6-8 hours for up to 3 days.   Allergies - helps relieve runny nose, itchy eyes and sneezing   Claritin (generic loratidine), Allegra (fexofenidine), or Zyrtec (generic cyrterizine) for runny nose. These medications should not cause drowsiness.  Note - Benadryl (generic diphenhydramine) may be used however may cause drowsiness  Cough -   Delsym or Robitussin (generic dextromethorphan)  Expectorants - helps loosen mucus to ease  removal   Mucinex (generic guaifenesin) as directed on the package.  Headaches / General Aches   Tylenol (generic acetaminophen) - DO NOT EXCEED 3 grams (3,000 mg) in a 24 hour time period  Advil/Motrin (generic ibuprofen)   Sore Throat -   Salt water gargle   Chloraseptic (generic benzocaine) spray or lozenges / Sucrets (generic dyclonine)
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
## 2017-10-21 NOTE — Progress Notes (Signed)
Subjective:    Patient ID: [name], male    DOB: 17-Mar-1965, 52 y.o.   MRN: [id number]  Chief Complaint  Patient presents with  . HIV Positive/AIDS  . Depression  . Hypertension    HPI:  ISAIR INABINET is a 52 y.o. male who presents today for follow up of HIV disease.  1.) HIV - Mr. Kallal was last seen in the office on 11/18/16 for routine follow up as he had been previously hospitalized in March 2018 with loss of vision in the right eye. 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 He was maintained on his ART of Odefsey with a viral load of 43 and CD4 count of 470. Recommended to follow up in 6 months and has missed appointments. He is due for Prevnar and influenza vaccinations today.  Mr. Gladu has not taken his Industry for about 2-3l months secondary to being depressed and not caring. He is now working to get re-established and back on track. When taking his medications he had no adverse side effects nor any problems taking his medication. He is currently experiencing bad sinus congestion and cough that has been going on for about 4 days. Cough is described as productive sputum. Modifying factors include Tylenol Sinus which resulted in him throwing up. Symptoms are generally worse at night. No tooth/dental pain. Does have ear pain and can hear whistling. Course of the symptoms are about the same since initial onset.   2.) Hypertension - Mr. Barringer was previously prescribed lisinopril and has not been taking his medication in the past 2-3 months. His blood pressure has been elevated at 175/105. He has has been having headaches. Denies worst headache of life. Does continue to have decreased vision in his right with a new episode of loss of vision that gradually returned. No chest pain, shortness of breath. May have had a low grade fever of 100.3 with no fevers in the last 48 hours.   3.) Depression - He is currently undergoing several stressors including change of jobs, housing and  relationships and is slowly improving, however continues to have symptoms dysphoric mood. Denies suicidal/homicidal ideations or hallucinations. Not currently taking medication.  Depression screen [facility] 2/9 10/21/2017 11/18/2016 05/26/2016 07/15/2015 02/28/2015  Decreased Interest 1 1 1  0 0  Down, Depressed, Hopeless 1 1 1  0 0  PHQ - 2 Score 2 2 2  0 0  Altered sleeping 1 0 1 - -  Tired, decreased energy 1 1 1  - -  Change in appetite 1 1 0 - -  Feeling bad or failure about yourself  1 0 1 - -  Trouble concentrating 1 1 2  - -  Moving slowly or fidgety/restless 1 0 1 - -  Suicidal thoughts 1 0 1 - -  PHQ-9 Score 9 5 9  - -  Difficult doing work/chores - Not difficult at all - - -    No Known Allergies    Outpatient Medications Prior to Visit  Medication Sig Dispense Refill  . Cyanocobalamin (VITAMIN B 12 PO) Take 1 tablet by mouth daily.    Marland Kitchen gabapentin (NEURONTIN) 300 MG capsule Take 1 capsule at night for 1 week, then increase to 1 capsule twice a day 60 capsule 5  . pseudoephedrine-acetaminophen (TYLENOL SINUS) 30-500 MG TABS tablet Take 1 tablet by mouth every 4 (four) hours as needed (sinus congestion).    . traMADol (ULTRAM) 50 MG tablet Take 1 tablet every 12 hours as needed for severe pain 30 tablet  5  . amLODipine (NORVASC) 10 MG tablet Take 1 tablet (10 mg total) by mouth daily. 90 tablet 3  . emtricitabine-rilpivir-tenofovir AF (ODEFSEY) 200-25-25 MG TABS tablet Take 1 tablet by mouth daily with breakfast. 30 tablet 3  . lisinopril (PRINIVIL,ZESTRIL) 40 MG tablet Take 1 tablet (40 mg total) by mouth daily. 90 tablet 3   No facility-administered medications prior to visit.      Past Medical History:  Diagnosis Date  . Anxiety   . Asthma   . Depression   . HIV (human immunodeficiency virus infection) ([place])   . Hypertension   . Hypoglycemia    sugars drop ion     Past Surgical History:  Procedure Laterality Date  . COLONOSCOPY  02/12/2012   Procedure: COLONOSCOPY;   Surgeon: Leighton Ruff, MD;  Location: [facility];  Service: Endoscopy;  Laterality: N/A;  . HERNIA REPAIR     umbilibcal     Review of Systems  Constitutional: Negative for appetite change, chills, fatigue, fever and unexpected weight change.  Eyes: Negative for visual disturbance.  Respiratory: Negative for cough, chest tightness, shortness of breath and wheezing.   Cardiovascular: Negative for chest pain and leg swelling.  Gastrointestinal: Negative for abdominal pain, constipation, diarrhea, nausea and vomiting.  Genitourinary: Negative for dysuria, flank pain, frequency, genital sores, hematuria and urgency.  Skin: Negative for rash.  Allergic/Immunologic: Negative for immunocompromised state.  Neurological: Negative for dizziness and headaches.  Psychiatric/Behavioral: Positive for dysphoric mood and sleep disturbance. Negative for hallucinations, self-injury and suicidal ideas.      Objective:    BP (!) 184/105   Pulse 75   Temp 98.5 F (36.9 C)   Wt 149 lb 12.8 oz (67.9 kg)   BMI 23.46 kg/m  Nursing note and vital signs reviewed.  Physical Exam  Constitutional: He is oriented to person, place, and time. He appears well-developed. No distress.  HENT:  Mouth/Throat: Oropharynx is clear and moist.  Eyes: Conjunctivae are normal.  Neck: Neck supple.  Cardiovascular: Normal rate, regular rhythm, normal heart sounds and intact distal pulses. Exam reveals no gallop and no friction rub.  No murmur heard. Pulmonary/Chest: Effort normal and breath sounds normal. No respiratory distress. He has no wheezes. He has no rales. He exhibits no tenderness.  Abdominal: Soft. Bowel sounds are normal. There is no tenderness.  Lymphadenopathy:    He has no cervical adenopathy.  Neurological: He is alert and oriented to person, place, and time.  Skin: Skin is warm and dry. No rash noted.  Psychiatric: He exhibits a depressed mood.       Assessment & Plan:   Problem List Items  Addressed This Visit      Cardiovascular and Mediastinum   Essential hypertension    Mr. Clinkscale has poorly controlled hypertension secondary to poor adherence to the medication regimen as he has not taken his medication in several months related to his depression.  He does have a headache today, however denies worse headache of life with no other symptoms of endorgan damage on physical exam.  Encouraged to take medication as prescribed to reduce risk of endorgan damage and cardiovascular disease in the future.  Refill lisinopril and amlodipine.  Encouraged to monitor blood pressure at home at different times throughout the day.  Follow-up in 1 week for blood pressure check.      Relevant Medications   lisinopril (PRINIVIL,ZESTRIL) 40 MG tablet   amLODipine (NORVASC) 10 MG tablet   Other Relevant Orders   Comprehensive  metabolic panel (Completed)     Other   Human immunodeficiency virus (HIV) disease ([place]) - Primary    Mr. Calame likely has poorly controlled HIV disease secondary to poor adherence to his medication regimen and not taking it for the last several months.  This is due to his depression.  He is now in a better place and will be seeing our counselor following our office visit.  Emphasized using resources to help with his problem solving. Fortunately he has no signs/symptoms of opportunistic infection or progressive HIV disease through history or physical exam.  Restart Odefsey.  Will check blood work today including genotype.  Prevnar and influenza updated today.  Plan for follow-up in 1 month or sooner for recheck of viral load and CD4 count.  Will require close follow-up in the short-term until he reestablishes.      Relevant Medications   emtricitabine-rilpivir-tenofovir AF (ODEFSEY) 200-25-25 MG TABS tablet   Other Relevant Orders   HIV-1 RNA ultraquant reflex to gentyp+   T-helper cell (CD4)- ([facility] clinic only)   CMV abs, IgG+IgM (cytomegalovirus)   RPR (Completed)    Comprehensive metabolic panel (Completed)   Pneumococcal conjugate vaccine 13-valent IM (Completed)   Cryptococcal Ag, Ltx Scr Rflx Titer   Depression    Mr. Hosie has had his major depressive disorder with a moderate recurrence resulting in his lack of caring for his medical regimen.  He has no suicidal ideations and does not appear to have any psychotic features.  He is meeting with our counselor following this office visit.  Will consider medication at next office visit if symptoms are not improving with cognitive behavioral therapy.       Other Visit Diagnoses    Need for pneumococcal vaccination       Relevant Orders   Pneumococcal conjugate vaccine 13-valent IM (Completed)       I am having Ondre E. Feuerborn maintain his pseudoephedrine-acetaminophen, gabapentin, traMADol, Cyanocobalamin (VITAMIN B 12 PO), lisinopril, amLODipine, and emtricitabine-rilpivir-tenofovir AF.   Meds ordered this encounter  Medications  . lisinopril (PRINIVIL,ZESTRIL) 40 MG tablet    Sig: Take 1 tablet (40 mg total) by mouth daily.    Dispense:  30 tablet    Refill:  1    Order Specific Question:   Supervising Provider    Answer:   Carlyle Basques [[id number]]  . amLODipine (NORVASC) 10 MG tablet    Sig: Take 1 tablet (10 mg total) by mouth daily.    Dispense:  30 tablet    Refill:  1    Order Specific Question:   Supervising Provider    Answer:   Carlyle Basques [[id number]]  . emtricitabine-rilpivir-tenofovir AF (ODEFSEY) 200-25-25 MG TABS tablet    Sig: Take 1 tablet by mouth daily with breakfast.    Dispense:  30 tablet    Refill:  1    Order Specific Question:   Supervising Provider    Answer:   Carlyle Basques [[id number]]     Follow-up: Return in about 1 month (around 11/20/2017), or if symptoms worsen or fail to improve. with 1 week follow up for blood pressure check.    Terri Piedra, MSN, FNP-C Nurse Practitioner [facility] Office phone:  [phone] Pager: [place] number: [phone]

## 2017-10-22 ENCOUNTER — Encounter: Payer: Self-pay | Admitting: Family

## 2017-10-22 LAB — T-HELPER CELL (CD4) - (RCID CLINIC ONLY)
CD4 % Helper T Cell: 22 % — ABNORMAL LOW (ref 33–55)
CD4 T CELL ABS: 450 /uL (ref 400–2700)

## 2017-10-22 NOTE — Assessment & Plan Note (Addendum)
[name] likely has poorly controlled HIV disease secondary to poor adherence to his medication regimen and not taking it for the last several months.  This is due to his depression.  He is now in a better place and will be seeing our counselor following our office visit.  Emphasized using resources to help with his problem solving. Fortunately he has no signs/symptoms of opportunistic infection or progressive HIV disease through history or physical exam.  Restart Odefsey.  Will check blood work today including genotype.  Prevnar and influenza updated today.  Plan for follow-up in 1 month or sooner for recheck of viral load and CD4 count.  Will require close follow-up in the short-term until he reestablishes.

## 2017-10-22 NOTE — Assessment & Plan Note (Signed)
Ryan Cruz has poorly controlled hypertension secondary to poor adherence to the medication regimen as he has not taken his medication in several months related to his depression.  He does have a headache today, however denies worse headache of life with no other symptoms of endorgan damage on physical exam.  Encouraged to take medication as prescribed to reduce risk of endorgan damage and cardiovascular disease in the future.  Refill lisinopril and amlodipine.  Encouraged to monitor blood pressure at home at different times throughout the day.  Follow-up in 1 week for blood pressure check.

## 2017-10-22 NOTE — Assessment & Plan Note (Signed)
Ryan Cruz has had his major depressive disorder with a moderate recurrence resulting in his lack of caring for his medical regimen.  He has no suicidal ideations and does not appear to have any psychotic features.  He is meeting with our counselor following this office visit.  Will consider medication at next office visit if symptoms are not improving with cognitive behavioral therapy.

## 2017-10-28 ENCOUNTER — Ambulatory Visit: Payer: No Typology Code available for payment source | Admitting: *Deleted

## 2017-10-28 VITALS — BP 147/76 | HR 88

## 2017-10-28 DIAGNOSIS — I10 Essential (primary) hypertension: Secondary | ICD-10-CM

## 2017-10-28 NOTE — Progress Notes (Signed)
Patient reports he feels better since starting the new medication for BP.

## 2017-11-02 DIAGNOSIS — I10 Essential (primary) hypertension: Secondary | ICD-10-CM

## 2017-11-02 MED ORDER — OLMESARTAN MEDOXOMIL 20 MG PO TABS: 20.0000 mg | ORAL_TABLET | Freq: Every day | ORAL | 2 refills | Status: DC

## 2017-11-03 LAB — HIV-1 RNA ULTRAQUANT REFLEX TO GENTYP+
HIV 1 RNA QUANT: 3620 {copies}/mL — AB
HIV-1 RNA QUANT, LOG: 3.56 {Log_copies}/mL — AB

## 2017-11-03 LAB — COMPREHENSIVE METABOLIC PANEL
AG Ratio: 1.1 (calc) (ref 1.0–2.5)
ALKALINE PHOSPHATASE (APISO): 82 U/L (ref 40–115)
ALT: 20 U/L (ref 9–46)
AST: 19 U/L (ref 10–35)
Albumin: 4.8 g/dL (ref 3.6–5.1)
BILIRUBIN TOTAL: 0.6 mg/dL (ref 0.2–1.2)
BUN: 13 mg/dL (ref 7–25)
CALCIUM: 10.4 mg/dL — AB (ref 8.6–10.3)
CO2: 22 mmol/L (ref 20–32)
CREATININE: 1.25 mg/dL (ref 0.70–1.33)
Chloride: 106 mmol/L (ref 98–110)
Globulin: 4.5 g/dL (calc) — ABNORMAL HIGH (ref 1.9–3.7)
Glucose, Bld: 85 mg/dL (ref 65–99)
Potassium: 4.6 mmol/L (ref 3.5–5.3)
Sodium: 139 mmol/L (ref 135–146)
Total Protein: 9.3 g/dL — ABNORMAL HIGH (ref 6.1–8.1)

## 2017-11-03 LAB — RPR: RPR: NONREACTIVE

## 2017-11-03 LAB — CRYPTOCOCCAL AG, LTX SCR RFLX TITER
Cryptococcal Ag Screen: NOT DETECTED
MICRO NUMBER: 91098548
SPECIMEN QUALITY:: ADEQUATE

## 2017-11-03 LAB — CMV ABS, IGG+IGM (CYTOMEGALOVIRUS): CYTOMEGALOVIRUS AB-IGG: 5.2 U/mL — AB

## 2017-11-03 LAB — HIV-1 GENOTYPE: HIV-1 GENOTYPE: DETECTED — AB

## 2017-11-09 ENCOUNTER — Other Ambulatory Visit: Payer: Self-pay | Admitting: Behavioral Health

## 2017-11-09 DIAGNOSIS — I10 Essential (primary) hypertension: Secondary | ICD-10-CM

## 2017-11-09 MED ORDER — OLMESARTAN MEDOXOMIL 20 MG PO TABS: 20.0000 mg | ORAL_TABLET | Freq: Every day | ORAL | 2 refills | Status: DC

## 2017-11-24 ENCOUNTER — Ambulatory Visit: Payer: No Typology Code available for payment source | Admitting: Licensed Clinical Social Worker

## 2017-12-17 ENCOUNTER — Other Ambulatory Visit: Payer: Self-pay | Admitting: Family

## 2017-12-17 DIAGNOSIS — B20 Human immunodeficiency virus [HIV] disease: Secondary | ICD-10-CM

## 2017-12-17 DIAGNOSIS — I10 Essential (primary) hypertension: Secondary | ICD-10-CM

## 2017-12-21 ENCOUNTER — Other Ambulatory Visit: Payer: No Typology Code available for payment source

## 2017-12-21 ENCOUNTER — Ambulatory Visit: Payer: No Typology Code available for payment source

## 2018-01-05 ENCOUNTER — Encounter: Payer: No Typology Code available for payment source | Admitting: Family

## 2018-01-05 NOTE — Progress Notes (Deleted)
   Subjective:    Patient ID: Ryan Cruz, male    DOB: 11/09/1965, 52 y.o.   MRN: [id number]  No chief complaint on file.    HPI:  Ryan Cruz is a 52 y.o. male who presents today for routine follow up of HIV disease.   Ryan Cruz was last seen in the office on 10/21/17 following having not taken his medications for about 2-3 months due to mental health and personal events going on in his life. Blood work showed a viral load of 3,620 with CD4 count of 450. Genotype was without resistance. His blood pressure was also poorly controlled. He was restarted on Odefsey for HIV and lisinopril and amlodipine for his hypertension. Blood pressure during follow up visit was 147/76. Health maintenance due includes colonoscopy, Menveo and influenza vaccinations.    No Known Allergies    Outpatient Medications Prior to Visit  Medication Sig Dispense Refill  . amLODipine (NORVASC) 10 MG tablet TAKE 1 TABLET(10 MG) BY MOUTH DAILY 30 tablet 0  . Cyanocobalamin (VITAMIN B 12 PO) Take 1 tablet by mouth daily.    Marland Kitchen gabapentin (NEURONTIN) 300 MG capsule Take 1 capsule at night for 1 week, then increase to 1 capsule twice a day 60 capsule 5  . ODEFSEY 200-25-25 MG TABS tablet TAKE 1 TABLET BY MOUTH DAILY WITH BREAKFAST 30 tablet 0  . olmesartan (BENICAR) 20 MG tablet Take 1 tablet (20 mg total) by mouth daily. 30 tablet 2  . pseudoephedrine-acetaminophen (TYLENOL SINUS) 30-500 MG TABS tablet Take 1 tablet by mouth every 4 (four) hours as needed (sinus congestion).    . traMADol (ULTRAM) 50 MG tablet Take 1 tablet every 12 hours as needed for severe pain 30 tablet 5   No facility-administered medications prior to visit.      Past Medical History:  Diagnosis Date  . Anxiety   . Asthma   . Depression   . HIV (human immunodeficiency virus infection) ([place])   . Hypertension   . Hypoglycemia    sugars drop ion     Past Surgical History:  Procedure Laterality Date  . COLONOSCOPY  02/12/2012   Procedure: COLONOSCOPY;  Surgeon: Leighton Ruff, MD;  Location: [facility];  Service: Endoscopy;  Laterality: N/A;  . HERNIA REPAIR     umbilibcal       Review of Systems    Objective:    There were no vitals taken for this visit. Nursing note and vital signs reviewed.  Physical Exam     Assessment & Plan:   Problem List Items Addressed This Visit    None       I am having [place] maintain his pseudoephedrine-acetaminophen, gabapentin, traMADol, Cyanocobalamin (VITAMIN B 12 PO), olmesartan, amLODipine, and ODEFSEY.   No orders of the defined types were placed in this encounter.    Follow-up: No follow-ups on file.   Terri Piedra, MSN, FNP-C Nurse Practitioner [facility] Office phone: [phone] Pager: [place] number: [phone]

## 2018-02-03 IMAGING — MR MR ORBITS WO/W CM
13 of 19 series · 32 of 48 positions shown · IV contrast (15 mh)
Comparison: CT HEAD Friday April, 2016 and CT angiogram head and
neck April 04, 2016

ADDENDUM:
Acute findings discussed with and reconfirmed by Dr.De Celestino,
Neurology on 04/16/2016 at [DATE].
CLINICAL DATA: RIGHT eye vision loss today. Eye shingles with
reported optic nerve swelling, on acyclovir for a week. Migraines.
History of hypertension and HIV.

EXAM:
MRI HEAD AND ORBITS WITHOUT AND WITH CONTRAST
TECHNIQUE: Multiplanar, multiecho pulse sequences of the brain and surrounding
structures were obtained without and with intravenous contrast.
Multiplanar, multiecho pulse sequences of the orbits and surrounding
structures were obtained including fat saturation techniques, before
and after intravenous contrast administration.
CONTRAST:  15mL MULTIHANCE GADOBENATE DIMEGLUMINE 529 MG/ML IV SOLN

[Series 2: FLAIR · sagittal · 5.0mm · 0.47mm/px · 2 of 23 slices shown (1 of 2)]
[im 1/23]
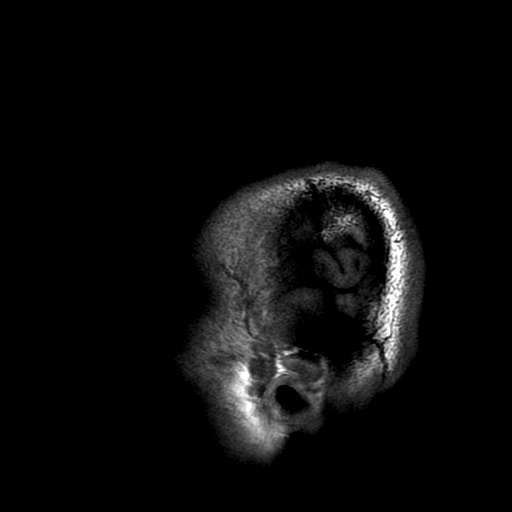
[im 23/23]
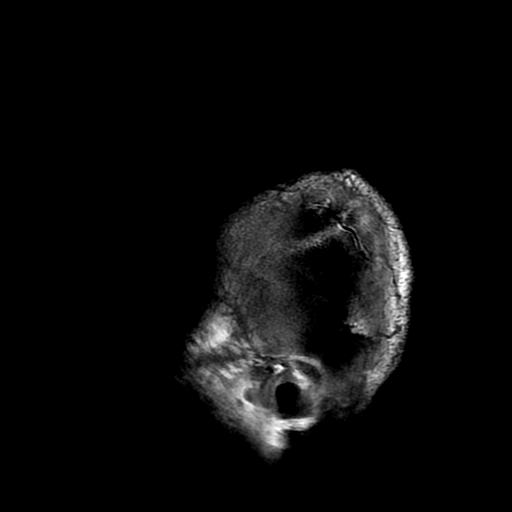

[Series 4: T2 · axial · 5.0mm · 0.47mm/px · z∈[-62,+75]mm · 2 of 24 slices shown]
[im 1/24]
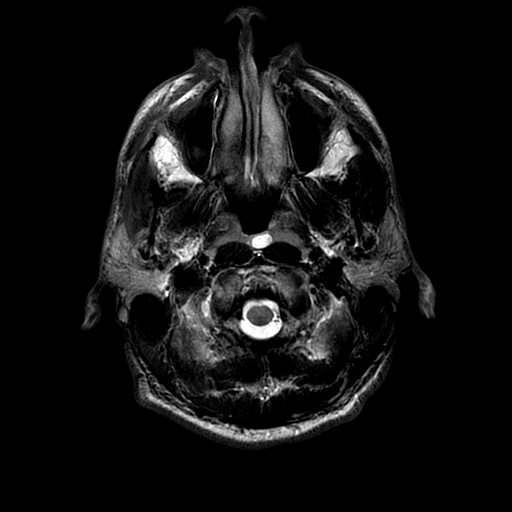
[im 24/24]
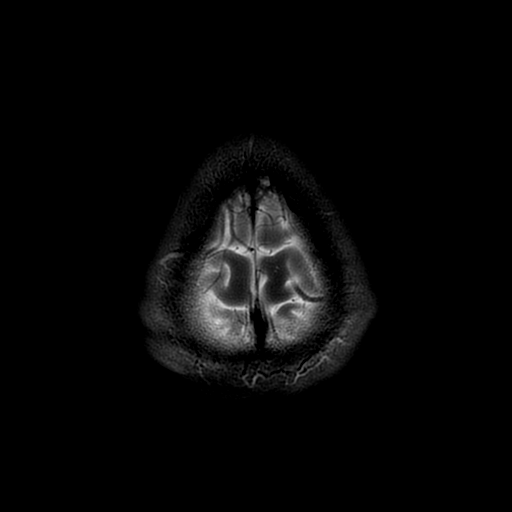

[Series 5: DWI · axial · 3.0mm · 0.94mm/px · z∈[-63,+76]mm · 6 of 92 slices shown (1 of 2)]
[im 1/92]
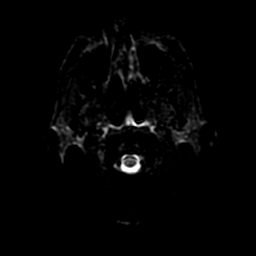
[im 19/92]
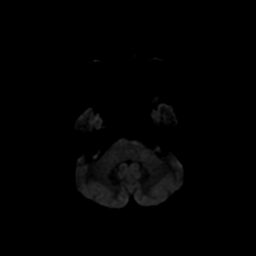
[im 37/92]
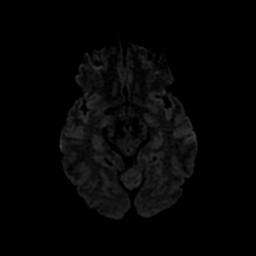
[im 55/92]
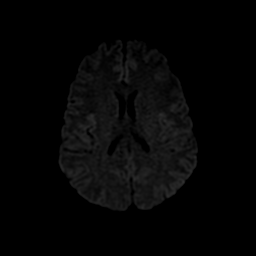
[im 73/92]
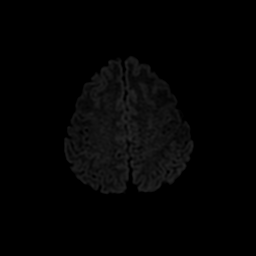
[im 92/92]
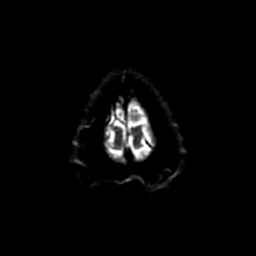

[Series 6: T2 fat-sat · axial · 3.0mm · 0.35mm/px · 1 of 19 slices shown (1 of 2)]
[im 1/19]
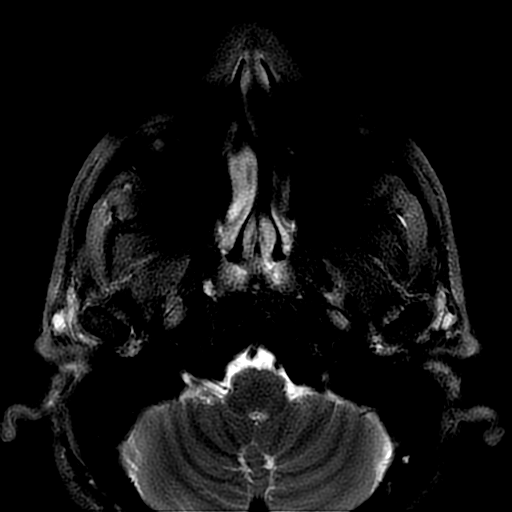

[Series 7: T1 · axial · 3.0mm · 0.35mm/px · 1 of 19 slices shown (1 of 2)]
[im 1/19]
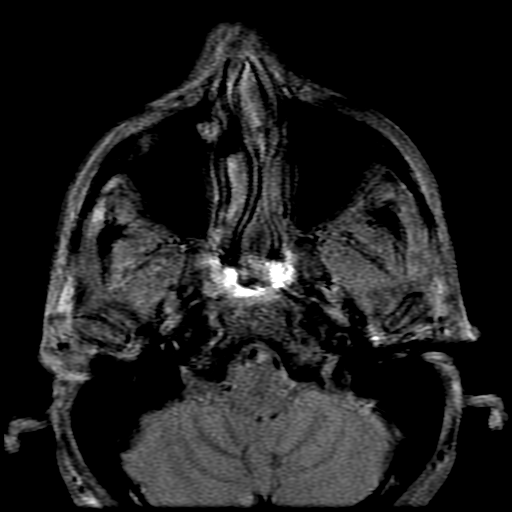

[Series 8: FLAIR · axial · 5.0mm · 0.47mm/px · z∈[-62,+75]mm · 2 of 24 slices shown (2 of 2)]
[im 1/24]
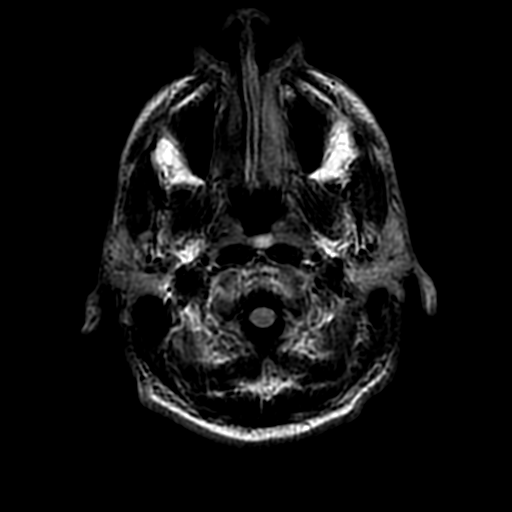
[im 24/24]
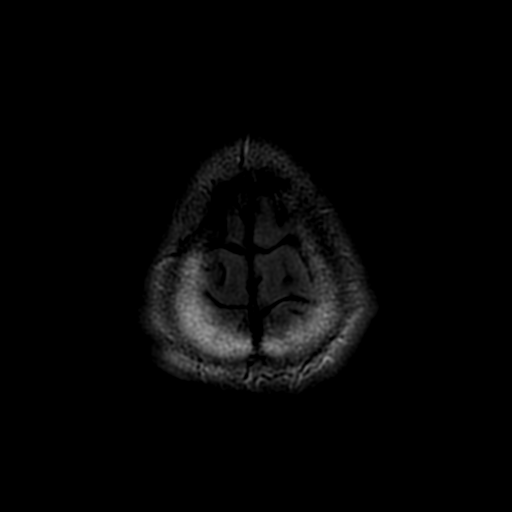

[Series 9: T2 fat-sat · coronal · 3.0mm · 0.35mm/px · 2 of 35 slices shown (2 of 2)]
[im 1/35]
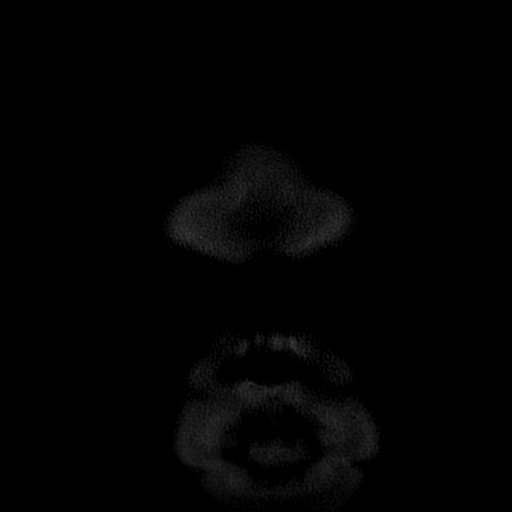
[im 35/35]
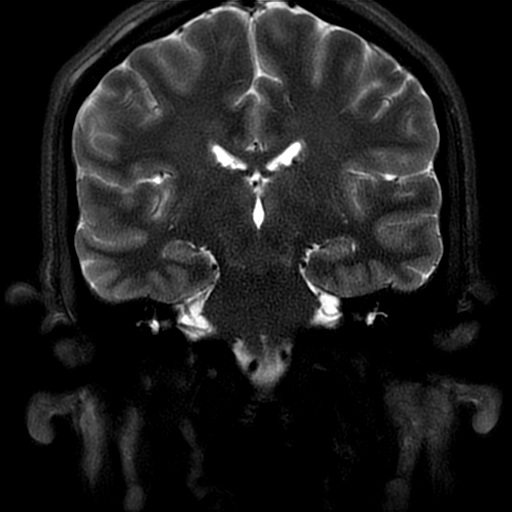

[Series 12: T1 · coronal · 3.0mm · 0.70mm/px · 2 of 35 slices shown (2 of 2)]
[im 1/35]
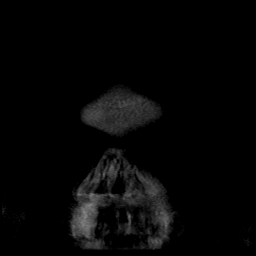
[im 35/35]
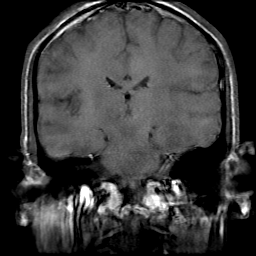

[Series 14: DWI · coronal · 4.0mm · 0.94mm/px · 5 of 70 slices shown (2 of 2)]
[im 1/70]
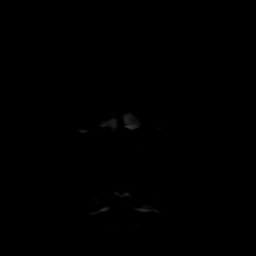
[im 18/70]
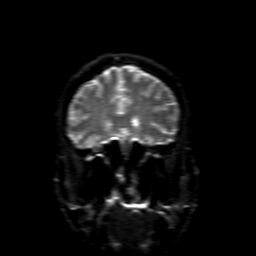
[im 35/70]
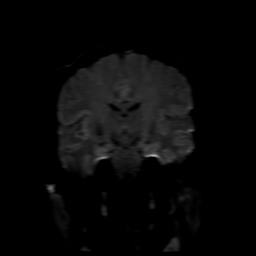
[im 52/70]
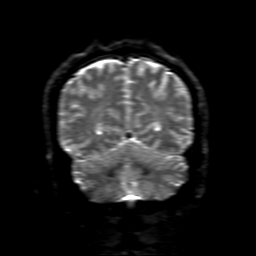
[im 70/70]
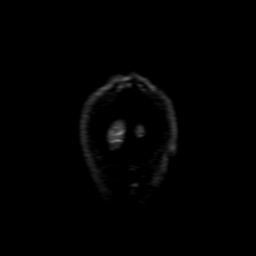

[Series 17: T2 post-contrast · coronal · 5.0mm · 0.39mm/px · 2 of 29 slices shown]
[im 1/29]
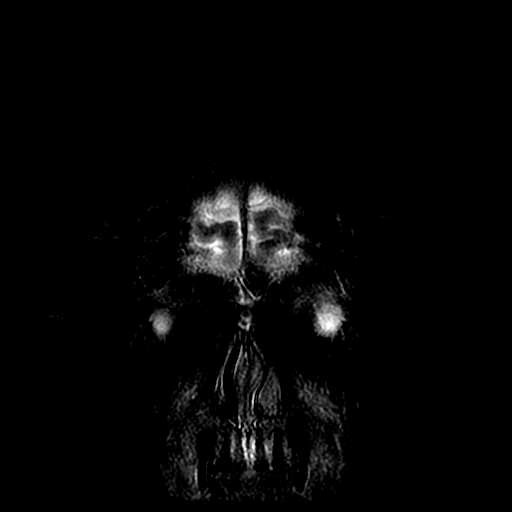
[im 29/29]
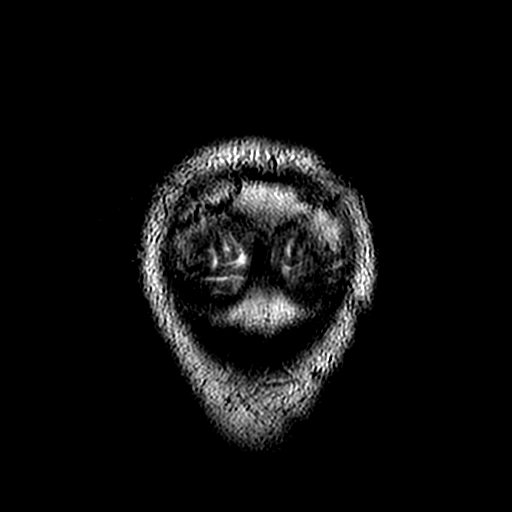

[Series 18: T1 fat-sat · coronal · 3.0mm · 0.70mm/px · 2 of 35 slices shown]
[im 1/35]
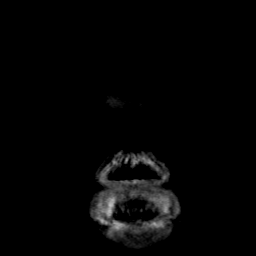
[im 35/35]
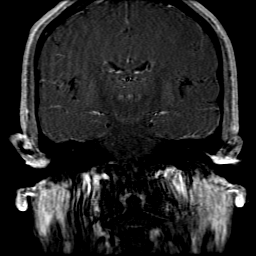

[Series 550: ADC · axial · 3.0mm · 0.94mm/px · z∈[-63,+76]mm · 3 of 47 slices shown (1 of 2)]
[im 1/47]
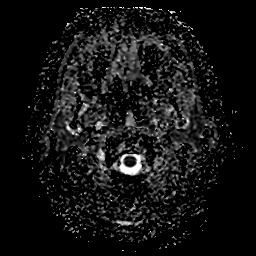
[im 24/47]
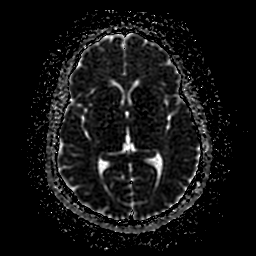
[im 47/47]
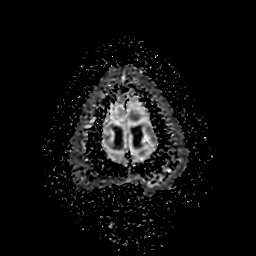

[Series 1450: ADC · coronal · 4.0mm · 0.94mm/px · 2 of 35 slices shown (2 of 2)]
[im 1/35]
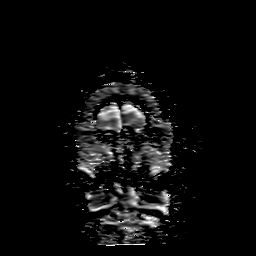
[im 35/35]
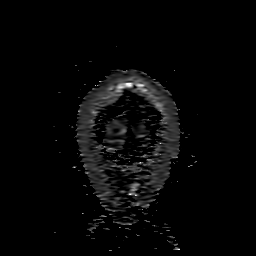

[32 of 48 positions shown; findings below may reference images not displayed]

FINDINGS: MRI HEAD FINDINGS- Mild motion degraded examination.

INTRACRANIAL CONTENTS: No reduced diffusion to suggest acute
ischemia nor hyperacute demyelination. No susceptibility artifact to
suggest hemorrhage. The ventricles and sulci are normal for
patient's age. No suspicious parenchymal signal, masses, mass
effect. No abnormal intraparenchymal or extra-axial enhancement. No
abnormal extra-axial fluid collections. No extra-axial masses.

VASCULAR: Normal major intracranial vascular flow voids present at
skull base.

SKULL AND UPPER CERVICAL SPINE: No abnormal sellar expansion. No
suspicious calvarial bone marrow signal. Craniocervical junction
maintained.

OTHER: None.

MRI ORBITS FINDINGS- moderately motion degraded examination.

ORBITS: Reduced diffusion RIGHT optic nerve within the intra-ocular
ocular and anterior intraorbital segments with reduced diffusion,
expanded bright T2 signal. Enhancement RIGHT optic nerve from the
intraorbital segment through the intracanalicular segment. Ocular
globes are intact with normal signal. Lenses are located.
Preservation of the orbital fat. Normal symmetric appearance of the
extraocular muscles. No intra-ocular mass, signal abnormality nor
abnormal enhancement. Superior ophthalmic veins are not enlarged.

VISUALIZED SINUSES: Trace paranasal sinus mucosal thickening without
air-fluid levels. Mastoid air cells are well aerated.

SOFT TISSUES: Normal.
IMPRESSION: MR orbits: Severe RIGHT optic neuritis, likely infectious related to
[place] given history of RIGHT eye shingles. However, superimposed
reduced diffusion within the optic nerve concerning for
ischemia/infarct (potentially secondary to local vasculopathy).

MRI brain:  Normal.

Acute findings discussed with and reconfirmed by PA SKARPAQJA SANAJA
on 04/16/2016 at [DATE].

## 2018-02-28 ENCOUNTER — Ambulatory Visit (INDEPENDENT_AMBULATORY_CARE_PROVIDER_SITE_OTHER): Payer: 59

## 2018-02-28 ENCOUNTER — Other Ambulatory Visit: Payer: Self-pay

## 2018-02-28 ENCOUNTER — Encounter (HOSPITAL_COMMUNITY): Payer: Self-pay | Admitting: Emergency Medicine

## 2018-02-28 ENCOUNTER — Ambulatory Visit (HOSPITAL_COMMUNITY)
Admission: EM | Admit: 2018-02-28 | Discharge: 2018-02-28 | Disposition: A | Discharge status: Home / Self Care | Payer: 59 | Attending: Internal Medicine | Admitting: Internal Medicine

## 2018-02-28 DIAGNOSIS — R109 Unspecified abdominal pain: Secondary | ICD-10-CM

## 2018-02-28 DIAGNOSIS — R509 Fever, unspecified: Secondary | ICD-10-CM

## 2018-02-28 DIAGNOSIS — R6889 Other general symptoms and signs: Secondary | ICD-10-CM | POA: Insufficient documentation

## 2018-02-28 DIAGNOSIS — M7918 Myalgia, other site: Secondary | ICD-10-CM

## 2018-02-28 DIAGNOSIS — R110 Nausea: Secondary | ICD-10-CM

## 2018-02-28 MED ORDER — ACETAMINOPHEN 325 MG PO TABS
ORAL_TABLET | ORAL | Status: AC
  Filled 2018-02-28: qty 2

## 2018-02-28 MED ORDER — CEFTRIAXONE SODIUM 1 G IJ SOLR
INTRAMUSCULAR | Status: AC
  Filled 2018-02-28: qty 10

## 2018-02-28 MED ORDER — CEFTRIAXONE SODIUM 1 G IJ SOLR
1.0000 g | Freq: Once | INTRAMUSCULAR | Status: AC
  Administered 2018-02-28: 1 g via INTRAMUSCULAR

## 2018-02-28 MED ORDER — ACETAMINOPHEN 325 MG PO TABS
650.0000 mg | ORAL_TABLET | Freq: Once | ORAL | Status: AC
  Administered 2018-02-28: 650 mg via ORAL

## 2018-02-28 MED ORDER — OSELTAMIVIR PHOSPHATE 75 MG PO CAPS: 75.0000 mg | ORAL_CAPSULE | Freq: Two times a day (BID) | ORAL | 0 refills | Status: DC

## 2018-02-28 NOTE — ED Notes (Signed)
Notified dr Marcille Blanco of fever

## 2018-02-28 NOTE — ED Triage Notes (Signed)
Felt bad Saturday night and started feeling bad again last night.  Patient is hot and cold, stuffy ears, "loopy", aching all over, and stomach "tied up tight", nausea-no vomiting and no diarrhea.

## 2018-03-04 ENCOUNTER — Ambulatory Visit: Payer: No Typology Code available for payment source

## 2018-04-26 ENCOUNTER — Encounter: Payer: Self-pay | Admitting: Family

## 2018-04-26 ENCOUNTER — Ambulatory Visit (INDEPENDENT_AMBULATORY_CARE_PROVIDER_SITE_OTHER): Payer: 59 | Admitting: Family

## 2018-04-26 ENCOUNTER — Other Ambulatory Visit: Payer: Self-pay

## 2018-04-26 VITALS — BP 155/98 | HR 69 | Temp 98.6°F | Wt 150.0 lb

## 2018-04-26 DIAGNOSIS — B20 Human immunodeficiency virus [HIV] disease: Secondary | ICD-10-CM

## 2018-04-26 DIAGNOSIS — R519 Headache, unspecified: Secondary | ICD-10-CM

## 2018-04-26 DIAGNOSIS — Z113 Encounter for screening for infections with a predominantly sexual mode of transmission: Secondary | ICD-10-CM | POA: Diagnosis not present

## 2018-04-26 DIAGNOSIS — I10 Essential (primary) hypertension: Secondary | ICD-10-CM

## 2018-04-26 DIAGNOSIS — R51 Headache: Secondary | ICD-10-CM

## 2018-04-26 DIAGNOSIS — Z9189 Other specified personal risk factors, not elsewhere classified: Secondary | ICD-10-CM | POA: Diagnosis not present

## 2018-04-26 DIAGNOSIS — Z0000 Encounter for general adult medical examination without abnormal findings: Secondary | ICD-10-CM

## 2018-04-26 MED ORDER — AMLODIPINE BESYLATE 10 MG PO TABS: ORAL_TABLET | ORAL | 2 refills | Status: DC

## 2018-04-26 MED ORDER — EMTRICITAB-RILPIVIR-TENOFOV AF 200-25-25 MG PO TABS: 1.0000 | ORAL_TABLET | Freq: Every day | ORAL | 2 refills | Status: DC

## 2018-04-26 NOTE — Assessment & Plan Note (Signed)
Mr. Lassen has poorly controlled HIV disease with less than optimal adherence to his ART regimen of Odefsey. He does not appear to have any signs/symptoms of opportunistic infection or progressive HIV disease at present. Emphasized importance of taking medications to prevent/reduce risk of future complications. 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 Restart Odefsey. Plan for follow up in 6 weeks with blood work 1-2 weeks prior to appointment.

## 2018-04-26 NOTE — Progress Notes (Signed)
Subjective:    Patient ID: Ryan Cruz, male    DOB: [birth year], 53 y.o.   MRN: [id number]  Chief Complaint  Patient presents with  . HIV Positive/AIDS  . Headache     HPI:  Ryan Cruz is a 53 y.o. male who presents today for routine follow up of HIV disease.  Ryan Cruz was last seen in the office on 10/21/2017 for routine follow-up with poor adherence to his ART regimen of Odefsey having been off medication for 2-3 months. His blood work showed a viral load of 3,620 with CD4 count of 450. Genotype with no mutations noted.  At that time his blood pressure was poorly controlled as he had been off his blood pressure medication as well.  Since that office visit he has been seen in the emergency room for flulike symptoms in January.  No new blood work has been completed since that appointment.  Ryan Cruz has been out of his Odefsey and amlodipine for the past 3 weeks as he ran out of medication and did not get it refilled. He has no adverse side effects to taking the medication. He does have a headache today that started about 2-3 days ago and describes feeling like a beam of sunlight started his headache with severe pain. Denies worst headache of life but states it feels similar to the time that he had a blood clot in his eye. Does have mild blurred vision like "having grease smeared on his vision." He went to an ophtalmologist a while back and did not have a positive experience. Sleep makes the symptoms better. Denies neck pain, trauma/injury or radiculopathy.   Ryan Cruz has [employer] insurance and was recently denied for UMAP due to his income. 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 Living situation is stable. Denies recreational or illicit drug use. Denies feeling down, depressed or hopeless.   No Known Allergies    Outpatient Medications Prior to Visit  Medication Sig Dispense Refill  . acetaminophen (TYLENOL) 325 MG tablet  Take 650 mg by mouth every 6 (six) hours as needed.    . Cyanocobalamin (VITAMIN B 12 PO) Take 1 tablet by mouth daily.    Marland Kitchen gabapentin (NEURONTIN) 300 MG capsule Take 1 capsule at night for 1 week, then increase to 1 capsule twice a day 60 capsule 5  . pseudoephedrine-acetaminophen (TYLENOL SINUS) 30-500 MG TABS tablet Take 1 tablet by mouth every 4 (four) hours as needed (sinus congestion).    . traMADol (ULTRAM) 50 MG tablet Take 1 tablet every 12 hours as needed for severe pain 30 tablet 5  . amLODipine (NORVASC) 10 MG tablet TAKE 1 TABLET(10 MG) BY MOUTH DAILY 30 tablet 0  . ODEFSEY 200-25-25 MG TABS tablet TAKE 1 TABLET BY MOUTH DAILY WITH BREAKFAST 30 tablet 0  . olmesartan (BENICAR) 20 MG tablet Take 1 tablet (20 mg total) by mouth daily. 30 tablet 2  . oseltamivir (TAMIFLU) 75 MG capsule Take 1 capsule (75 mg total) by mouth every 12 (twelve) hours. 10 capsule 0   No facility-administered medications prior to visit.      Past Medical History:  Diagnosis Date  . Anxiety   . Asthma   . Depression   . HIV (human immunodeficiency virus infection) ([place])   . Hypertension   . Hypoglycemia    sugars drop ion     Past Surgical History:  Procedure Laterality Date  .  COLONOSCOPY  02/12/2012   Procedure: COLONOSCOPY;  Surgeon: Leighton Ruff, MD;  Location: [facility];  Service: Endoscopy;  Laterality: N/A;  . HERNIA REPAIR     umbilibcal       Review of Systems  Constitutional: Negative for appetite change, chills, fatigue, fever and unexpected weight change.  Eyes: Negative for photophobia, pain and visual disturbance.       Positive for blurred vision.   Respiratory: Negative for cough, chest tightness, shortness of breath and wheezing.   Cardiovascular: Negative for chest pain and leg swelling.  Gastrointestinal: Negative for abdominal pain, constipation, diarrhea, nausea and vomiting.  Genitourinary: Negative for dysuria, flank pain, frequency, genital sores, hematuria  and urgency.  Skin: Negative for rash.  Allergic/Immunologic: Negative for immunocompromised state.  Neurological: Positive for headaches. Negative for dizziness.      Objective:    BP (!) 155/98   Pulse 69   Temp 98.6 F (37 C)   Wt 150 lb (68 kg)   BMI 23.49 kg/m  Nursing note and vital signs reviewed.  Physical Exam Constitutional:      General: He is not in acute distress.    Appearance: He is well-developed.  Eyes:     General: Lids are normal.        Right eye: No foreign body, discharge or hordeolum.        Left eye: No foreign body, discharge or hordeolum.     Extraocular Movements:     Right eye: No nystagmus.     Left eye: No nystagmus.     Conjunctiva/sclera: Conjunctivae normal.     Right eye: Right conjunctiva is not injected. No chemosis, exudate or hemorrhage.    Left eye: Left conjunctiva is not injected. No chemosis, exudate or hemorrhage.    Pupils: Pupils are equal, round, and reactive to light.     Funduscopic exam:    Right eye: No hemorrhage, exudate or papilledema. Red reflex present.        Left eye: No hemorrhage, exudate or papilledema. Red reflex present. Neck:     Musculoskeletal: Neck supple.  Cardiovascular:     Rate and Rhythm: Normal rate and regular rhythm.     Heart sounds: Normal heart sounds. No murmur. No friction rub. No gallop.   Pulmonary:     Effort: Pulmonary effort is normal. No respiratory distress.     Breath sounds: Normal breath sounds. No wheezing or rales.  Chest:     Chest wall: No tenderness.  Abdominal:     General: Bowel sounds are normal.     Palpations: Abdomen is soft.     Tenderness: There is no abdominal tenderness.  Lymphadenopathy:     Cervical: No cervical adenopathy.  Skin:    General: Skin is warm and dry.     Findings: No rash.  Neurological:     Mental Status: He is alert and oriented to person, place, and time.  Psychiatric:        Mood and Affect: Mood normal.        Assessment & Plan:    Problem List Items Addressed This Visit      Cardiovascular and Mediastinum   Essential hypertension    Ryan Cruz has poorly controlled blood pressure at present due to less than optimal adherence to his blood pressure medications. Discussed importance of taking medications as prescribed and likely a contributing factor to his headache. Denies worst headache of life at present. Encouraged to monitor blood pressure at home.  Discussed symptoms to watch out for and seek further care. Follow up in 6 weeks or sooner if needed.       Relevant Medications   amLODipine (NORVASC) 10 MG tablet     Other   Human immunodeficiency virus (HIV) disease ([place]) - Primary    Ryan Cruz has poorly controlled HIV disease with less than optimal adherence to his ART regimen of Odefsey. He does not appear to have any signs/symptoms of opportunistic infection or progressive HIV disease at present. Emphasized importance of taking medications to prevent/reduce risk of future complications. 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 Restart Odefsey. Plan for follow up in 6 weeks with blood work 1-2 weeks prior to appointment.       Relevant Medications   emtricitabine-rilpivir-tenofovir AF (ODEFSEY) 200-25-25 MG TABS tablet   Other Relevant Orders   CBC   HIV-1 RNA ultraquant reflex to gentyp+   COMPLETE METABOLIC PANEL WITH GFR   Acute nonintractable headache    Ryan Cruz has acute onset headache with blurred vision which may be related to his elevated blood pressure although concerning he is having similar ocular symptoms as previous neuritis. Will check RPR today to rule out occular syphilis. Encouraged follow up with ophthalmology or neurology for further evaluation and treatment as indicated.       Relevant Medications   amLODipine (NORVASC) 10 MG tablet   Healthcare maintenance     Declines immunizations today.   Will place referral for dental screening with [facility] dental clinic.  Reminded of  importance of safe sexual practice to reduce risk of acquisition and transmission of STI.   Referral to GI placed for colonoscopy.        Other Visit Diagnoses    Screening for STDs (sexually transmitted diseases)       Relevant Orders   RPR   At risk for colon cancer       Relevant Orders   Ambulatory referral to Gastroenterology       I have discontinued Bishop E. Eguia's olmesartan and oseltamivir. I have also changed his Odefsey to emtricitabine-rilpivir-tenofovir AF. Additionally, I am having him maintain his pseudoephedrine-acetaminophen, gabapentin, traMADol, Cyanocobalamin (VITAMIN B 12 PO), acetaminophen, and amLODipine.   Meds ordered this encounter  Medications  . emtricitabine-rilpivir-tenofovir AF (ODEFSEY) 200-25-25 MG TABS tablet    Sig: Take 1 tablet by mouth daily with breakfast.    Dispense:  30 tablet    Refill:  2    Order Specific Question:   Supervising Provider    Answer:   Carlyle Basques [[id number]]  . amLODipine (NORVASC) 10 MG tablet    Sig: TAKE 1 TABLET(10 MG) BY MOUTH DAILY    Dispense:  30 tablet    Refill:  2    Order Specific Question:   Supervising Provider    Answer:   Carlyle Basques [[id number]]     Follow-up: Return in about 6 weeks (around 06/07/2018), or if symptoms worsen or fail to improve.   Terri Piedra, MSN, FNP-C Nurse Practitioner [facility] for Infectious Disease [place] [facility] Office phone: [phone] Pager: [place] number: [phone]

## 2018-04-26 NOTE — Addendum Note (Signed)
Addended by: Mauricio Po D on: 04/26/2018 03:43 PM   Modules accepted: Orders

## 2018-04-26 NOTE — Assessment & Plan Note (Signed)
   Declines immunizations today.   Will place referral for dental screening with [facility] dental clinic.  Reminded of importance of safe sexual practice to reduce risk of acquisition and transmission of STI.   Referral to GI placed for colonoscopy.

## 2018-04-26 NOTE — Patient Instructions (Signed)
Nice to see you.  We will check your blood work today.  Please start re-taking your Odefsey and Amlodipine.  There is a copay card for your [facility].  Please let us know if your headache worsens or does not improve.  Plan to follow up in 6 weeks with blood work 1-2 weeks prior to appointment.   We will send a referral to Gastroenterology for colon cancer screening.

## 2018-04-26 NOTE — Assessment & Plan Note (Signed)
Ryan Cruz has poorly controlled blood pressure at present due to less than optimal adherence to his blood pressure medications. Discussed importance of taking medications as prescribed and likely a contributing factor to his headache. Denies worst headache of life at present. Encouraged to monitor blood pressure at home. Discussed symptoms to watch out for and seek further care. Follow up in 6 weeks or sooner if needed.

## 2018-04-26 NOTE — Assessment & Plan Note (Signed)
Mr. Cortright has acute onset headache with blurred vision which may be related to his elevated blood pressure although concerning he is having similar ocular symptoms as previous neuritis. Will check RPR today to rule out occular syphilis. Encouraged follow up with ophthalmology or neurology for further evaluation and treatment as indicated.

## 2018-04-27 ENCOUNTER — Telehealth: Payer: Self-pay | Admitting: Pharmacist

## 2018-04-27 ENCOUNTER — Telehealth: Payer: Self-pay | Admitting: Pharmacy Technician

## 2018-04-27 NOTE — Telephone Encounter (Signed)
[facility] Patient Advocate Encounter  Completed and sent [employer] Advancing Access application for [facility] for this patient who is uninsured.    Patient is approved 04/27/2018 through 04/27/2019.  BIN      [id number] PCN    [id number] GRP    [id number] ID        [id number]   Inez Catalina E. Nadara Mustard [place] Patient [facility] for Infectious Disease Phone: [phone] Fax:  [phone]

## 2018-04-27 NOTE — Telephone Encounter (Signed)
[facility] Patient Advocate Encounter  Completed and sent [employer] Advancing Access application for [facility] for this patient who is uninsured.     This encounter will be updated until final determination.   Venida Jarvis. Nadara Mustard [place] Patient [facility] for Infectious Disease Phone: [phone] Fax:  [phone]

## 2018-04-27 NOTE — Telephone Encounter (Addendum)
Patient called stating that he took the copay card we gave him for his Vernell Leep to [facility] and they stated it wasn't working.  He said they told him that his insurance did not cover any specialty medications and that copay cards will not work.  Will hand over to patient advocate to assess insurance and find a way to, hopefully, cover patient's cost.

## 2018-05-12 LAB — COMPLETE METABOLIC PANEL WITH GFR
AG Ratio: 1.2 (calc) (ref 1.0–2.5)
ALT: 17 U/L (ref 9–46)
AST: 19 U/L (ref 10–35)
Albumin: 4.3 g/dL (ref 3.6–5.1)
Alkaline phosphatase (APISO): 61 U/L (ref 35–144)
BUN: 12 mg/dL (ref 7–25)
CO2: 26 mmol/L (ref 20–32)
Calcium: 9.6 mg/dL (ref 8.6–10.3)
Chloride: 104 mmol/L (ref 98–110)
Creat: 1.14 mg/dL (ref 0.70–1.33)
GFR, Est African American: 86 mL/min/{1.73_m2} (ref >=60)
GFR, Est Non African American: 75 mL/min/{1.73_m2} (ref >=60)
GLOBULIN: 3.5 g/dL (ref 1.9–3.7)
Glucose, Bld: 79 mg/dL (ref 65–99)
Potassium: 3.8 mmol/L (ref 3.5–5.3)
Sodium: 138 mmol/L (ref 135–146)
Total Bilirubin: 0.6 mg/dL (ref 0.2–1.2)
Total Protein: 7.8 g/dL (ref 6.1–8.1)

## 2018-05-12 LAB — CBC
HCT: 33.4 % — ABNORMAL LOW (ref 38.5–50.0)
Hemoglobin: 11.4 g/dL — ABNORMAL LOW (ref 13.2–17.1)
MCH: 32.4 pg (ref 27.0–33.0)
MCHC: 34.1 g/dL (ref 32.0–36.0)
MCV: 94.9 fL (ref 80.0–100.0)
MPV: 10.6 fL (ref 7.5–12.5)
Platelets: 269 10*3/uL (ref 140–400)
RBC: 3.52 10*6/uL — ABNORMAL LOW (ref 4.20–5.80)
RDW: 13.2 % (ref 11.0–15.0)
WBC: 6.3 10*3/uL (ref 3.8–10.8)

## 2018-05-12 LAB — HIV-1 RNA ULTRAQUANT REFLEX TO GENTYP+
HIV 1 RNA Quant: 11000 copies/mL — ABNORMAL HIGH
HIV-1 RNA Quant, Log: 4.04 Log copies/mL — ABNORMAL HIGH

## 2018-05-12 LAB — HIV-1 GENOTYPE: HIV-1 Genotype: DETECTED — AB

## 2018-05-12 LAB — RPR: RPR Ser Ql: NONREACTIVE

## 2018-05-23 ENCOUNTER — Telehealth: Payer: Self-pay | Admitting: Family

## 2018-05-23 NOTE — Telephone Encounter (Signed)
COVID-19 Pre-Screening Questions:05/23/18  Do you currently have a fever (>100 F), chills or unexplained body aches? NO  Are you currently experiencing new cough, shortness of breath, sore throat, runny nose?NO   Have you recently travelled outside the state of [place] in the last 14 days? NO   Have you been in contact with someone that is currently pending confirmation of Covid19 testing or has been confirmed to have the [place] virus?  NO  **If the patient answers NO to ALL questions -  advise the patient to please call the clinic before coming to the office should any symptoms develop.

## 2018-05-24 ENCOUNTER — Other Ambulatory Visit: Payer: Self-pay

## 2018-05-24 ENCOUNTER — Other Ambulatory Visit: Payer: 59

## 2018-05-24 DIAGNOSIS — B20 Human immunodeficiency virus [HIV] disease: Secondary | ICD-10-CM

## 2018-05-25 LAB — T-HELPER CELL (CD4) - (RCID CLINIC ONLY)
CD4 % Helper T Cell: 21 % — ABNORMAL LOW (ref 33–55)
CD4 T Cell Abs: 600 /uL (ref 400–2700)

## 2018-05-26 LAB — COMPREHENSIVE METABOLIC PANEL
AG Ratio: 1.2 (calc) (ref 1.0–2.5)
ALT: 12 U/L (ref 9–46)
AST: 17 U/L (ref 10–35)
Albumin: 4.1 g/dL (ref 3.6–5.1)
Alkaline phosphatase (APISO): 55 U/L (ref 35–144)
BUN: 16 mg/dL (ref 7–25)
CO2: 26 mmol/L (ref 20–32)
Calcium: 9.3 mg/dL (ref 8.6–10.3)
Chloride: 105 mmol/L (ref 98–110)
Creat: 1.12 mg/dL (ref 0.70–1.33)
Globulin: 3.5 g/dL (calc) (ref 1.9–3.7)
Glucose, Bld: 112 mg/dL — ABNORMAL HIGH (ref 65–99)
Potassium: 3.7 mmol/L (ref 3.5–5.3)
Sodium: 138 mmol/L (ref 135–146)
Total Bilirubin: 0.4 mg/dL (ref 0.2–1.2)
Total Protein: 7.6 g/dL (ref 6.1–8.1)

## 2018-05-26 LAB — HIV-1 RNA QUANT-NO REFLEX-BLD
HIV 1 RNA Quant: <20 copies/mL — AB
HIV-1 RNA Quant, Log: <1.3 Log copies/mL — AB

## 2018-06-01 ENCOUNTER — Telehealth: Payer: Self-pay | Admitting: Family

## 2018-06-01 NOTE — Telephone Encounter (Signed)
COVID-19 Pre-Screening Questions: ° °Do you currently have a fever (>100 °F), chills or unexplained body aches? no  ° °Are you currently experiencing new cough, shortness of breath, sore throat, runny nose?no  °•  °Have you recently travelled outside the state of [place] in the last 14 days? no °•  °Have you been in contact with someone that is currently pending confirmation of Covid19 testing or has been confirmed to have the Covid19 virus? no °

## 2018-06-02 ENCOUNTER — Encounter: Payer: Self-pay | Admitting: Family

## 2018-06-02 ENCOUNTER — Ambulatory Visit (INDEPENDENT_AMBULATORY_CARE_PROVIDER_SITE_OTHER): Payer: 59 | Admitting: Family

## 2018-06-02 ENCOUNTER — Other Ambulatory Visit: Payer: Self-pay

## 2018-06-02 VITALS — BP 166/95 | HR 68 | Temp 98.7°F | Wt 151.0 lb

## 2018-06-02 DIAGNOSIS — I10 Essential (primary) hypertension: Secondary | ICD-10-CM

## 2018-06-02 DIAGNOSIS — Z0000 Encounter for general adult medical examination without abnormal findings: Secondary | ICD-10-CM

## 2018-06-02 DIAGNOSIS — B20 Human immunodeficiency virus [HIV] disease: Secondary | ICD-10-CM

## 2018-06-02 MED ORDER — HYDROCHLOROTHIAZIDE 12.5 MG PO TABS: 12.5000 mg | ORAL_TABLET | Freq: Every day | ORAL | 2 refills | Status: DC

## 2018-06-02 MED ORDER — AMLODIPINE BESYLATE 10 MG PO TABS: ORAL_TABLET | ORAL | 2 refills | Status: DC

## 2018-06-02 MED ORDER — EMTRICITAB-RILPIVIR-TENOFOV AF 200-25-25 MG PO TABS: 1.0000 | ORAL_TABLET | Freq: Every day | ORAL | 2 refills | Status: DC

## 2018-06-02 NOTE — Assessment & Plan Note (Signed)
   Declines vaccinations today.  Discussed importance of safe sexual practice to reduce risk of acquisition/transmission of STI.

## 2018-06-02 NOTE — Assessment & Plan Note (Signed)
[name] has been improved control of his HIV disease with a viral load that is now undetectable with good adherence and tolerance to [facility].  He has no signs/symptoms of opportunistic infection or progressive HIV disease at present.  Continue current dose of Odefsey.  Reminded to avoid antacids as well as consume Odefsey with largest meal.  Continue current dose of Odefsey.  Plan for follow-up in 3 months or sooner if needed with lab work on the same day.

## 2018-06-02 NOTE — Assessment & Plan Note (Signed)
Blood pressure remains elevated above goal 140/90 with current dose of amlodipine.  He has no signs/symptoms of endorgan damage at present with no headaches or blurred/changed vision.  Will start hydrochlorothiazide in addition to amlodipine.  May need to consider additional work-up to rule out other underlying causes of hypertension.  Encouraged to monitor blood pressure at home and follow a diet that is low in vegetable oils and processed/sugary foods.

## 2018-06-02 NOTE — Progress Notes (Signed)
Subjective:    Patient ID: Ryan Cruz, male    DOB: [birth year], 53 y.o.   MRN: [id number]  Chief Complaint  Patient presents with   HIV Positive/AIDS     HPI:  Ryan Cruz is a 53 y.o. male with HIV disease who was last seen in the office on 04/26/18 having been out of his Odefsey and amlodipine for 3 weeks.  His viral load at the time was 11,000.  He was restarted on Odefsey and amlodipine with most recent blood work completed on 05/24/2018 with CD4 count of 600 and viral load that was undetectable.  Blood pressure remains elevated today.  Healthcare maintenance due includes Menveo.  [name] has been taking both his Odefsey and amlodipine as prescribed with no adverse side effects and good adherence with no missed doses.  Overall he feels well today.  He does have symptoms of gastroesophageal reflux on occasion 1-2 times per week.  He takes his [place] with the largest meal of the day. Denies fevers, chills, night sweats, headaches, changes in vision, neck pain/stiffness, nausea, diarrhea, vomiting, lesions or rashes.  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  He has plenty of sick leave but is requesting a letter stating that he is asymptomatic.  Denies recreational or illicit drug use.  Denies feelings of being down, depressed, or hopeless in the last 2 weeks.   No Known Allergies    Outpatient Medications Prior to Visit  Medication Sig Dispense Refill   acetaminophen (TYLENOL) 325 MG tablet Take 650 mg by mouth every 6 (six) hours as needed.     emtricitabine-rilpivir-tenofovir AF (ODEFSEY) 200-25-25 MG TABS tablet Take 1 tablet by mouth daily with breakfast. 30 tablet 2   Cyanocobalamin (VITAMIN B 12 PO) Take 1 tablet by mouth daily.     gabapentin (NEURONTIN) 300 MG capsule Take 1 capsule at night for 1 week, then increase to 1 capsule twice a day 60  capsule 5   pseudoephedrine-acetaminophen (TYLENOL SINUS) 30-500 MG TABS tablet Take 1 tablet by mouth every 4 (four) hours as needed (sinus congestion).     traMADol (ULTRAM) 50 MG tablet Take 1 tablet every 12 hours as needed for severe pain 30 tablet 5   amLODipine (NORVASC) 10 MG tablet TAKE 1 TABLET(10 MG) BY MOUTH DAILY 30 tablet 2   No facility-administered medications prior to visit.      Past Medical History:  Diagnosis Date   Anxiety    Asthma    Depression    HIV (human immunodeficiency virus infection) ([place])    Hypertension    Hypoglycemia    sugars drop ion     Past Surgical History:  Procedure Laterality Date   COLONOSCOPY  02/12/2012   Procedure: COLONOSCOPY;  Surgeon: Leighton Ruff, MD;  Location: [facility];  Service: Endoscopy;  Laterality: N/A;   HERNIA REPAIR     umbilibcal       Review of Systems  Constitutional: Negative for appetite change, chills, fatigue, fever and unexpected weight change.  Eyes: Negative for visual disturbance.       Negative for changes in vision  Respiratory: Negative for cough, chest tightness, shortness of breath and wheezing.   Cardiovascular: Negative for chest pain, palpitations and leg swelling.  Gastrointestinal: Negative for abdominal pain, constipation, diarrhea, nausea and vomiting.  Genitourinary: Negative for dysuria, flank pain, frequency,  genital sores, hematuria and urgency.  Skin: Negative for rash.  Allergic/Immunologic: Negative for immunocompromised state.  Neurological: Negative for dizziness, weakness, light-headedness and headaches.      Objective:    BP (!) 166/95    Pulse 68    Temp 98.7 F (37.1 C)    Wt 151 lb (68.5 kg)    BMI 23.65 kg/m  Nursing note and vital signs reviewed.  Physical Exam Constitutional:      General: He is not in acute distress.    Appearance: He is well-developed.  Eyes:     Conjunctiva/sclera: Conjunctivae normal.  Neck:     Musculoskeletal: Neck supple.   Cardiovascular:     Rate and Rhythm: Normal rate and regular rhythm.     Heart sounds: Normal heart sounds. No murmur. No friction rub. No gallop.   Pulmonary:     Effort: Pulmonary effort is normal. No respiratory distress.     Breath sounds: Normal breath sounds. No wheezing or rales.  Chest:     Chest wall: No tenderness.  Abdominal:     General: Bowel sounds are normal.     Palpations: Abdomen is soft.     Tenderness: There is no abdominal tenderness.  Lymphadenopathy:     Cervical: No cervical adenopathy.  Skin:    General: Skin is warm and dry.     Findings: No rash.  Neurological:     Mental Status: He is alert and oriented to person, place, and time.  Psychiatric:        Mood and Affect: Mood normal.        Assessment & Plan:   Problem List Items Addressed This Visit      Cardiovascular and Mediastinum   Essential hypertension    Blood pressure remains elevated above goal 140/90 with current dose of amlodipine.  He has no signs/symptoms of endorgan damage at present with no headaches or blurred/changed vision.  Will start hydrochlorothiazide in addition to amlodipine.  May need to consider additional work-up to rule out other underlying causes of hypertension.  Encouraged to monitor blood pressure at home and follow a diet that is low in vegetable oils and processed/sugary foods.      Relevant Medications   amLODipine (NORVASC) 10 MG tablet   hydrochlorothiazide (HYDRODIURIL) 12.5 MG tablet     Other   Human immunodeficiency virus (HIV) disease ([name]) - Primary    [name] has been improved control of his HIV disease with a viral load that is now undetectable with good adherence and tolerance to [facility].  He has no signs/symptoms of opportunistic infection or progressive HIV disease at present.  Continue current dose of Odefsey.  Reminded to avoid antacids as well as consume Odefsey with largest meal.  Continue current dose of Odefsey.  Plan for follow-up in 3 months  or sooner if needed with lab work on the same day.      Relevant Medications   emtricitabine-rilpivir-tenofovir AF (ODEFSEY) 200-25-25 MG TABS tablet   Other Relevant Orders   T-helper cell (CD4)- ([facility] clinic only)   HIV-1 RNA quant-no reflex-bld   Comprehensive metabolic panel   Healthcare maintenance     Declines vaccinations today.  Discussed importance of safe sexual practice to reduce risk of acquisition/transmission of STI.          I am having [place] start on hydrochlorothiazide. I am also having him maintain his pseudoephedrine-acetaminophen, gabapentin, traMADol, Cyanocobalamin (VITAMIN B 12 PO), acetaminophen, emtricitabine-rilpivir-tenofovir AF, and amLODipine.  Meds ordered this encounter  Medications   emtricitabine-rilpivir-tenofovir AF (ODEFSEY) 200-25-25 MG TABS tablet    Sig: Take 1 tablet by mouth daily with breakfast.    Dispense:  30 tablet    Refill:  2    Order Specific Question:   Supervising Provider    Answer:   Baxter Flattery, CYNTHIA [[id number]]   amLODipine (NORVASC) 10 MG tablet    Sig: TAKE 1 TABLET(10 MG) BY MOUTH DAILY    Dispense:  30 tablet    Refill:  2    Order Specific Question:   Supervising Provider    Answer:   Baxter Flattery, CYNTHIA [[id number]]   hydrochlorothiazide (HYDRODIURIL) 12.5 MG tablet    Sig: Take 1 tablet (12.5 mg total) by mouth daily.    Dispense:  30 tablet    Refill:  2    Order Specific Question:   Supervising Provider    Answer:   Carlyle Basques [[id number]]     Follow-up: Return in about 3 months (around 09/01/2018), or if symptoms worsen or fail to improve.   Terri Piedra, MSN, FNP-C Nurse Practitioner [facility] for Infectious Disease [place] number: [phone]

## 2018-06-02 NOTE — Patient Instructions (Signed)
Nice to see you.  Please continue to take your amlodipine and Odefsey as prescribed.  Please start taking hydrochlorothiazide.  Recommend decreasing vegetable oils and processed/sugary foods.  Monitor your blood pressure at home and send numbers through MyChart in a couple of weeks - check once daily at different times.   We will plan to follow up in 3 months or sooner if needed with lab work 1-2 weeks prior to appointment.

## 2018-07-25 ENCOUNTER — Telehealth: Payer: Self-pay | Admitting: Internal Medicine

## 2018-07-25 NOTE — Telephone Encounter (Signed)
Hi Dr. Carlean Purl, we received a referral on 04/26/18 from Dr. Elna Breslow for a repeat colonoscopy. Patient had a colonoscopy with Dr. Marcello Moores in 2014. Records will be placed on your desk for review. Please advise for scheduling. Thank you.

## 2018-07-25 NOTE — Telephone Encounter (Signed)
Chart reviewed  Colonoscopy 2014 with recto-sigmoid polyps NOT pre-cancerous so NEXT COLONOSCOPY DUE 02/2022  Place recall please

## 2018-07-25 NOTE — Telephone Encounter (Signed)
Left message to call back to give Dr. Celesta Aver recommendations. Recall was entered.

## 2018-07-27 ENCOUNTER — Other Ambulatory Visit: Payer: Self-pay | Admitting: Family

## 2018-07-27 DIAGNOSIS — B20 Human immunodeficiency virus [HIV] disease: Secondary | ICD-10-CM

## 2018-08-02 ENCOUNTER — Encounter (HOSPITAL_COMMUNITY): Payer: Self-pay | Admitting: Emergency Medicine

## 2018-08-02 ENCOUNTER — Ambulatory Visit (HOSPITAL_COMMUNITY)
Admission: EM | Admit: 2018-08-02 | Discharge: 2018-08-02 | Disposition: A | Discharge status: Home / Self Care | Payer: 59 | Attending: Family Medicine | Admitting: Family Medicine

## 2018-08-02 ENCOUNTER — Other Ambulatory Visit: Payer: Self-pay

## 2018-08-02 DIAGNOSIS — Z202 Contact with and (suspected) exposure to infections with a predominantly sexual mode of transmission: Secondary | ICD-10-CM | POA: Diagnosis present

## 2018-08-02 DIAGNOSIS — Z7252 High risk homosexual behavior: Secondary | ICD-10-CM | POA: Diagnosis not present

## 2018-08-02 DIAGNOSIS — B20 Human immunodeficiency virus [HIV] disease: Secondary | ICD-10-CM

## 2018-08-02 NOTE — Discharge Instructions (Addendum)
We will call you with your test results.  You can check your MyChart lab results section also

## 2018-08-02 NOTE — ED Triage Notes (Signed)
Pt here for possible syphilis exposure

## 2018-08-02 NOTE — ED Provider Notes (Signed)
[place]    CSN: [id number] Arrival date & time: 08/02/18  1107      History   Chief Complaint Chief Complaint  Patient presents with  . Exposure to STD    HPI Ryan Cruz is a 53 y.o. male.   HPI  Ryan Cruz is a gay male.  He heard from a person who heard from a person that he might have been exposed to syphilis.  This is no definite.  He is concerned.  He has no rash.  He has no symptoms.  He has no discharge.  He has no ulcers.  He would like to be tested. Known HIV Declines testing for GC gonorrhea and trichomonas against advice  Past Medical History:  Diagnosis Date  . Anxiety   . Asthma   . Depression   . HIV (human immunodeficiency virus infection) ([place])   . Hypertension   . Hypoglycemia    sugars drop ion    Patient Active Problem List   Diagnosis Date Noted  . Healthcare maintenance 04/26/2018  . Hepatitis B immune 11/18/2016  . Medically noncompliant   . 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 Other headache syndrome   . Optic neuritis 04/16/2016  . Chest pain 11/20/2015  . Hypoglycemia 01/29/2014  . Allergic sinusitis 06/29/2012  . Pilonidal cyst 01/04/2012  . Depression 06/25/2010  . 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 Essential hypertension 12/12/2009  . Ventral hernia 03/28/2009  . NEUROPATHY, NOS 04/30/2008  . DENTAL CARIES 12/14/2007  . TOBACCO USER 06/01/2007  . VERTIGO 05/13/2007  . Human immunodeficiency virus (HIV) disease ([place]) 02/24/2006  . HYPERLIPIDEMIA 02/24/2006  . Rectal bleeding 11/19/2005  . ASTHMA 12/11/1994    Past Surgical History:  Procedure Laterality Date  . COLONOSCOPY  02/12/2012   Procedure: COLONOSCOPY;  Surgeon: Leighton Ruff, MD;  Location: [facility];  Service: Endoscopy;  Laterality: N/A;  . HERNIA REPAIR     umbilibcal       Home Medications    Prior to Admission medications   Medication Sig Start Date End Date Taking? Authorizing Provider   acetaminophen (TYLENOL) 325 MG tablet Take 650 mg by mouth every 6 (six) hours as needed.    [provider]  amLODipine (NORVASC) 10 MG tablet TAKE 1 TABLET(10 MG) BY MOUTH DAILY 06/02/18   Golden Circle, FNP  Cyanocobalamin (VITAMIN B 12 PO) Take 1 tablet by mouth daily.    [provider]  gabapentin (NEURONTIN) 300 MG capsule Take 1 capsule at night for 1 week, then increase to 1 capsule twice a day 05/18/16   Cameron Sprang, MD  hydrochlorothiazide (HYDRODIURIL) 12.5 MG tablet Take 1 tablet (12.5 mg total) by mouth daily. 06/02/18   Golden Circle, FNP  ODEFSEY 200-25-25 MG TABS tablet TAKE 1 TABLET BY MOUTH DAILY WITH BREAKFAST 07/27/18   Golden Circle, FNP  pseudoephedrine-acetaminophen (TYLENOL SINUS) 30-500 MG TABS tablet Take 1 tablet by mouth every 4 (four) hours as needed (sinus congestion).    [provider]  traMADol Veatrice Bourbon) 50 MG tablet Take 1 tablet every 12 hours as needed for severe pain 05/18/16   Cameron Sprang, MD    Family History Family History  Problem Relation Age of Onset  . Diabetes Mother   . Hypertension Mother   . Heart disease Mother   . Congestive Heart Failure Mother   . Diabetes Father   . Hypertension Father   .  Diabetes Sister   . HIV Sister   . Hypertension Sister   . Diabetes Brother   . Hypertension Brother   . Diabetes Brother   . Hypertension Brother   . Diabetes Brother   . Hypertension Brother   . Diabetes Brother   . Hypertension Brother     Social History Social History   Tobacco Use  . Smoking status: Current Every Day Smoker    Packs/day: 0.20    Years: 15.00    Pack years: 3.00    Types: Cigarettes  . Smokeless tobacco: Never Used  Substance Use Topics  . Alcohol use: Yes    Alcohol/week: 2.0 standard drinks    Types: 1 Glasses of wine, 1 Shots of liquor per week    Comment: occasional  . Drug use: Yes    Frequency: 7.0 times per week    Types: Marijuana    Comment: marijuana daily      Allergies   Patient has no known allergies.   Review of Systems Review of Systems  Constitutional: Negative for chills and fever.  HENT: Negative for ear pain and sore throat.   Eyes: Negative for pain and visual disturbance.  Respiratory: Negative for cough and shortness of breath.   Cardiovascular: Negative for chest pain and palpitations.  Gastrointestinal: Negative for abdominal pain and vomiting.  Genitourinary: Negative for dysuria and hematuria.  Musculoskeletal: Negative for arthralgias and back pain.  Skin: Negative for color change and rash.  Neurological: Negative for seizures and syncope.  All other systems reviewed and are negative.    Physical Exam Triage Vital Signs ED Triage Vitals  Enc Vitals Group     BP 08/02/18 1143 (!) 157/90     Pulse Rate 08/02/18 1143 78     Resp 08/02/18 1143 18     Temp 08/02/18 1143 98.3 F (36.8 C)     Temp Source 08/02/18 1143 Oral     SpO2 08/02/18 1143 100 %     Weight --      Height --      Head Circumference --      Peak Flow --      Pain Score 08/02/18 1144 0     Pain Loc --      Pain Edu? --      Excl. in [place]? --    No data found.  Updated Vital Signs BP (!) 157/90 (BP Location: Left Arm)   Pulse 78   Temp 98.3 F (36.8 C) (Oral)   Resp 18   SpO2 100%      Physical Exam Constitutional:      General: He is not in acute distress.    Appearance: He is well-developed.  HENT:     Head: Normocephalic and atraumatic.  Eyes:     Conjunctiva/sclera: Conjunctivae normal.     Pupils: Pupils are equal, round, and reactive to light.  Neck:     Musculoskeletal: Normal range of motion.  Cardiovascular:     Rate and Rhythm: Normal rate.  Pulmonary:     Effort: Pulmonary effort is normal. No respiratory distress.  Abdominal:     General: There is no distension.     Palpations: Abdomen is soft.  Musculoskeletal: Normal range of motion.  Skin:    General: Skin is warm and dry.  Neurological:     Mental  Status: He is alert.      UC Treatments / Results  Labs (all labs ordered are listed, but only abnormal  results are displayed) Labs Reviewed  RPR    EKG None  Radiology No results found.  Procedures Procedures (including critical care time)  Medications Ordered in UC Medications - No data to display  Initial Impression / Assessment and Plan / UC Course  I have reviewed the triage vital signs and the nursing notes.  Pertinent labs & imaging results that were available during my care of the patient were reviewed by me and considered in my medical decision making (see chart for details).     Patient is satisfied to have an RPR done and to come back for treatment if it is positive. Final Clinical Impressions(s) / UC Diagnoses   Final diagnoses:  Possible exposure to STD     Discharge Instructions     We will call you with your test results.  You can check your MyChart lab results section also   ED Prescriptions    None     Controlled Substance Prescriptions [place] Controlled Substance Registry consulted? Not Applicable   Raylene Everts, MD 08/02/18 2039

## 2018-08-03 LAB — RPR: RPR Ser Ql: NONREACTIVE

## 2018-08-27 ENCOUNTER — Other Ambulatory Visit: Payer: Self-pay | Admitting: Family

## 2018-08-27 DIAGNOSIS — I10 Essential (primary) hypertension: Secondary | ICD-10-CM

## 2018-08-28 ENCOUNTER — Encounter: Payer: Self-pay | Admitting: Family Medicine

## 2018-08-29 ENCOUNTER — Other Ambulatory Visit: Payer: Self-pay

## 2018-08-29 ENCOUNTER — Ambulatory Visit (HOSPITAL_COMMUNITY)
Admission: EM | Admit: 2018-08-29 | Discharge: 2018-08-29 | Disposition: A | Discharge status: Home / Self Care | Payer: 59 | Attending: Family Medicine | Admitting: Family Medicine

## 2018-08-29 ENCOUNTER — Encounter (HOSPITAL_COMMUNITY): Payer: Self-pay

## 2018-08-29 DIAGNOSIS — K6289 Other specified diseases of anus and rectum: Secondary | ICD-10-CM | POA: Diagnosis not present

## 2018-08-29 MED ORDER — HYDROCODONE-ACETAMINOPHEN 5-325 MG PO TABS: 1.0000 | ORAL_TABLET | Freq: Four times a day (QID) | ORAL | 0 refills | Status: DC | PRN

## 2018-08-29 MED ORDER — NIFEDIPINE POWD: 0 refills | Status: DC

## 2018-08-29 MED ORDER — DOXYCYCLINE HYCLATE 100 MG PO CAPS: 100.0000 mg | ORAL_CAPSULE | Freq: Two times a day (BID) | ORAL | 0 refills | Status: DC

## 2018-08-29 NOTE — ED Triage Notes (Signed)
Pt presents with rectal pain, swelling and some bleeding he believes to be associated with hemorrhoids.

## 2018-08-29 NOTE — ED Provider Notes (Signed)
[place]   [id number] 08/29/18 Arrival Time: 0900  ASSESSMENT & PLAN:  1. Rectal pain     Question forming rectal abscess vs small anal fissure. Discussed.  Meds ordered this encounter  Medications  . HYDROcodone-acetaminophen (NORCO/VICODIN) 5-325 MG tablet    Sig: Take 1 tablet by mouth every 6 (six) hours as needed for moderate pain or severe pain.    Dispense:  8 tablet    Refill:  0  . NIFEdipine POWD    Sig: Please compound into 0.2% ointment to be applied to anal area tid.    Dispense:  100 g    Refill:  0  . doxycycline (VIBRAMYCIN) 100 MG capsule    Sig: Take 1 capsule (100 mg total) by mouth 2 (two) times daily.    Dispense:  20 capsule    Refill:  0   [place] Controlled Substances Registry consulted for this patient. I feel the risk/benefit ratio today is favorable for proceeding with this prescription for a controlled substance. Medication sedation precautions given.  Follow-up Information    Schedule an appointment as soon as possible for a visit  with Surgery, [place].   Specialty: General Surgery Contact information: [address] [place] [address] [phone]          Agrees to proceed to ED should symptoms rapidly worsen or if he develops a fever.  Reviewed expectations re: course of current medical issues. Questions answered. Outlined signs and symptoms indicating need for more acute intervention. Patient verbalized understanding. After Visit Summary given.   SUBJECTIVE: History from: patient. Ryan Cruz is a 53 y.o. male who presents with complaint of persistent rectal pain without abdominal or back pain. Onset gradual, first noticed approx 2-3 days ago. Discomfort described as a dull ache with sporadic 'very sharp' pain; without radiation; does not wake him at night. Symptoms are gradually worsening since beginning. No hard stools or straining with bowel movements reported. No anal injury. Has noticed a small  amount of bright red blood on toilet tissue after bowel movement; no frank bleeding otherwise. Fever: absent. Aggravating factors: standing/walking. Alleviating factors: have not been identified. He denies arthralgias, constipation, diarrhea, dysuria, nausea, sweats and vomiting. Appetite: normal. PO intake: normal. Ambulatory without assistance. Urinary symptoms: none. Bowel movements: have not significantly changed. OTC treatment: analgesics without pain relief.   Past Surgical History:  Procedure Laterality Date  . COLONOSCOPY  02/12/2012   Procedure: COLONOSCOPY;  Surgeon: Leighton Ruff, MD;  [facility];  Service: Endoscopy;  Laterality: N/A;  . HERNIA REPAIR     umbilibcal    ROS: As per HPI. All other systems negative.  OBJECTIVE:  Vitals:   08/29/18 0935  BP: (!) 160/83  Pulse: 70  Resp: 18  Temp: 98.4 F (36.9 C)  TempSrc: Oral  SpO2: 98%    General appearance: alert, oriented, no acute distress Lungs: clear to auscultation bilaterally; unlabored respirations Heart: regular rate and rhythm Abdomen: soft; without distention; no tenderness; normal bowel sounds; without masses or organomegaly; without guarding or rebound tenderness Rectal: no appreciable masses or obvious anal fissure; no external hemorrhoids; no gross bleeding; does report pain with rectal exam; area/tissue just at inferior rectum feels slightly thickened; no overlying erythema; no drainage; no skin lesions Back: without CVA tenderness; FROM at waist Extremities: without LE edema; symmetrical; without gross deformities Skin: warm and dry Neurologic: normal gait Psychological: alert and cooperative; normal mood and affect  No Known Allergies  Past Medical History:  Diagnosis Date  . Anxiety   . Asthma   . Depression   . HIV (human immunodeficiency virus infection) ([place])   . Hypertension   . Hypoglycemia    sugars drop ion   Social History    Socioeconomic History  . Marital status: Married    Spouse name: Not on file  . Number of children: Not on file  . Years of education: Not on file  . Highest education level: Not on file  Occupational History  . Occupation: medication aide and hairstylist  Social Needs  . Financial resource strain: Not on file  . Food insecurity    Worry: Not on file    Inability: Not on file  . Transportation needs    Medical: Not on file    Non-medical: Not on file  Tobacco Use  . Smoking status: Current Every Day Smoker    Packs/day: 0.20    Years: 15.00    Pack years: 3.00    Types: Cigarettes  . Smokeless tobacco: Never Used  Substance and Sexual Activity  . Alcohol use: Yes    Alcohol/week: 2.0 standard drinks    Types: 1 Glasses of wine, 1 Shots of liquor per week    Comment: occasional  . Drug use: Yes    Frequency: 7.0 times per week    Types: Marijuana    Comment: marijuana daily  . Sexual activity: Yes    Partners: Male    Comment: pt. declined condoms  Lifestyle  . Physical activity    Days per week: Not on file    Minutes per session: Not on file  . Stress: Not on file  Relationships  . Social Herbalist on phone: Not on file    Gets together: Not on file    Attends religious service: Not on file    Active member of club or organization: Not on file    Attends meetings of clubs or organizations: Not on file    Relationship status: Not on file  . Intimate partner violence    Fear of current or ex partner: Not on file    Emotionally abused: Not on file    Physically abused: Not on file    Forced sexual activity: Not on file  Other Topics Concern  . Not on file  Social History Narrative  . Not on file   Family History  Problem Relation Age of Onset  . Diabetes Mother   . Hypertension Mother   . Heart disease Mother   . Congestive Heart Failure Mother   . Diabetes Father   . Hypertension Father   . Diabetes Sister   . HIV Sister   . Hypertension  Sister   . Diabetes Brother   . Hypertension Brother   . Diabetes Brother   . Hypertension Brother   . Diabetes Brother   . Hypertension Brother   . Diabetes Brother   . Hypertension Brother      Vanessa Kick, MD 08/29/18 1006

## 2018-09-01 ENCOUNTER — Other Ambulatory Visit: Payer: 59

## 2018-09-03 ENCOUNTER — Other Ambulatory Visit: Payer: Self-pay

## 2018-09-03 ENCOUNTER — Encounter (HOSPITAL_COMMUNITY): Payer: Self-pay

## 2018-09-03 ENCOUNTER — Ambulatory Visit (HOSPITAL_COMMUNITY)
Admission: EM | Admit: 2018-09-03 | Discharge: 2018-09-03 | Disposition: A | Discharge status: Home / Self Care | Payer: 59

## 2018-09-03 DIAGNOSIS — K6289 Other specified diseases of anus and rectum: Secondary | ICD-10-CM | POA: Diagnosis not present

## 2018-09-03 NOTE — ED Provider Notes (Signed)
[place]    CSN: [id number] Arrival date & time: 09/03/18  1338      History   Chief Complaint Chief Complaint  Patient presents with  . Hemorrhoids    HPI Ryan Cruz is a 53 y.o. male.   Ryan Cruz presents with complaints of persistent rectal pain. Pressure before passing a BM. Feels a squeezing type pain. Pain is worse when he is at work walking. Overall he does feel his symptoms have improved. Was seen here 7/20 and started on antibiotics for concern for rectal abscess. He feels he has improved. No fevers. No blood in stool or blood with wiping. No abdominal pain. States he feels urinary urgency at times but feels like this has improved as well. He has at least a BM daily, denies straining or hard stool. Has had rectal abscess in the past, was told to follow up with [place] surgery but has never gone. Has engaged in anal intercourse but not in years. Hx of anxiety, asthma, hiv, htn.    ROS per HPI, negative if not otherwise mentioned.      Past Medical History:  Diagnosis Date  . Anxiety   . Asthma   . Depression   . HIV (human immunodeficiency virus infection) ([place])   . Hypertension   . Hypoglycemia    sugars drop ion    Patient Active Problem List   Diagnosis Date Noted  . Healthcare maintenance 04/26/2018  . Hepatitis B immune 11/18/2016  . Medically noncompliant   . 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 Other headache syndrome   . Optic neuritis 04/16/2016  . Chest pain 11/20/2015  . Hypoglycemia 01/29/2014  . Allergic sinusitis 06/29/2012  . Pilonidal cyst 01/04/2012  . Depression 06/25/2010  . 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 Essential hypertension 12/12/2009  . Ventral hernia 03/28/2009  . NEUROPATHY, NOS 04/30/2008  . DENTAL CARIES 12/14/2007  . TOBACCO USER 06/01/2007  . VERTIGO 05/13/2007  . Human immunodeficiency virus (HIV) disease ([place]) 02/24/2006  . HYPERLIPIDEMIA 02/24/2006   . Rectal bleeding 11/19/2005  . ASTHMA 12/11/1994    Past Surgical History:  Procedure Laterality Date  . COLONOSCOPY  02/12/2012   Procedure: COLONOSCOPY;  Surgeon: Leighton Ruff, MD;  [facility];  Service: Endoscopy;  Laterality: N/A;  . HERNIA REPAIR     umbilibcal       Home Medications    Prior to Admission medications   Medication Sig Start Date End Date Taking? Authorizing Provider  acetaminophen (TYLENOL) 325 MG tablet Take 650 mg by mouth every 6 (six) hours as needed.    [provider]  amLODipine (NORVASC) 10 MG tablet TAKE 1 TABLET(10 MG) BY MOUTH DAILY 08/29/18   Golden Circle, FNP  Cyanocobalamin (VITAMIN B 12 PO) Take 1 tablet by mouth daily.    [provider]  doxycycline (VIBRAMYCIN) 100 MG capsule Take 1 capsule (100 mg total) by mouth 2 (two) times daily. 08/29/18   Vanessa Kick, MD  gabapentin (NEURONTIN) 300 MG capsule Take 1 capsule at night for 1 week, then increase to 1 capsule twice a day 05/18/16   Cameron Sprang, MD  hydrochlorothiazide (HYDRODIURIL) 12.5 MG tablet TAKE 1 TABLET(12.5 MG) BY MOUTH DAILY 08/29/18   Golden Circle, FNP  HYDROcodone-acetaminophen (NORCO/VICODIN) 5-325 MG tablet Take 1 tablet by mouth every 6 (six) hours as needed for moderate pain or severe pain. 08/29/18   Vanessa Kick, MD  NIFEdipine POWD Please compound into 0.2% ointment to be applied to anal area tid. 08/29/18   Vanessa Kick, MD  ODEFSEY 200-25-25 MG TABS tablet TAKE 1 TABLET BY MOUTH DAILY WITH BREAKFAST 07/27/18   Golden Circle, FNP  pseudoephedrine-acetaminophen (TYLENOL SINUS) 30-500 MG TABS tablet Take 1 tablet by mouth every 4 (four) hours as needed (sinus congestion).    [provider]  traMADol Veatrice Bourbon) 50 MG tablet Take 1 tablet every 12 hours as needed for severe pain 05/18/16   Cameron Sprang, MD    Family History Family History  Problem Relation Age of Onset  . Diabetes Mother   . Hypertension Mother   . Heart  disease Mother   . Congestive Heart Failure Mother   . Diabetes Father   . Hypertension Father   . Diabetes Sister   . HIV Sister   . Hypertension Sister   . Diabetes Brother   . Hypertension Brother   . Diabetes Brother   . Hypertension Brother   . Diabetes Brother   . Hypertension Brother   . Diabetes Brother   . Hypertension Brother     Social History Social History   Tobacco Use  . Smoking status: Current Every Day Smoker    Packs/day: 0.20    Years: 15.00    Pack years: 3.00    Types: Cigarettes  . Smokeless tobacco: Never Used  Substance Use Topics  . Alcohol use: Yes    Alcohol/week: 2.0 standard drinks    Types: 1 Glasses of wine, 1 Shots of liquor per week    Comment: occasional  . Drug use: Yes    Frequency: 7.0 times per week    Types: Marijuana    Comment: marijuana daily     Allergies   Patient has no known allergies.   Review of Systems Review of Systems   Physical Exam Triage Vital Signs ED Triage Vitals  Enc Vitals Group     BP 09/03/18 1417 (!) 153/95     Pulse Rate 09/03/18 1417 62     Resp 09/03/18 1417 18     Temp 09/03/18 1417 98.6 F (37 C)     Temp Source 09/03/18 1417 Oral     SpO2 09/03/18 1417 100 %     Weight 09/03/18 1420 151 lb (68.5 kg)     Height --      Head Circumference --      Peak Flow --      Pain Score 09/03/18 1559 5     Pain Loc --      Pain Edu? --      Excl. in [place]? --    No data found.  Updated Vital Signs BP (!) 153/95 (BP Location: Right Arm)   Pulse 62   Temp 98.6 F (37 C) (Oral)   Resp 18   Wt 151 lb (68.5 kg)   SpO2 100%   BMI 23.65 kg/m   Visual Acuity Right Eye Distance:   Left Eye Distance:   Bilateral Distance:    Right Eye Near:   Left Eye Near:    Bilateral Near:     Physical Exam Exam conducted with a chaperone present.  Constitutional:      Appearance: He is well-developed.  Cardiovascular:     Rate and Rhythm: Normal rate.  Pulmonary:     Effort: Pulmonary effort is  normal.  Abdominal:     Tenderness: There is no abdominal tenderness. There is no right CVA tenderness  or left CVA tenderness.  Genitourinary:    Comments: Tenderness to buttocks, surrounding anus; no visible fissures or hemorrhoids; no palpable or visible abscess presence; no redness  Skin:    General: Skin is warm and dry.  Neurological:     Mental Status: He is alert and oriented to person, place, and time.      UC Treatments / Results  Labs (all labs ordered are listed, but only abnormal results are displayed) Labs Reviewed - No data to display  EKG   Radiology No results found.  Procedures Procedures (including critical care time)  Medications Ordered in UC Medications - No data to display  Initial Impression / Assessment and Plan / UC Course  I have reviewed the triage vital signs and the nursing notes.  Pertinent labs & imaging results that were available during my care of the patient were reviewed by me and considered in my medical decision making (see chart for details).     Abscess which is improving with antibiotics? Unremarkable exam, tenderness only without redness, abscess visible or palpable. Work note provided, to continue with antibiotics. Follow up encouraged with er precautions as imaging may be warranted. Patient verbalized understanding and agreeable to plan.   Final Clinical Impressions(s) / UC Diagnoses   Final diagnoses:  Rectal pain     Discharge Instructions     Exam is overall reassuring here today.  Continue with antibiotics as previously prescribed. Sitz baths may also be helpful.  Stool softener daily to help prevent pain with Bm's.  Please follow up with your primary care provider for recheck as may need further evaluation or general surgery referral if symptoms persist.  Any worsening of pain please go to the ER.    ED Prescriptions    None     Controlled Substance Prescriptions [place] Controlled Substance Registry consulted? Not  Applicable   Zigmund Gottron, NP 09/03/18 1911

## 2018-09-03 NOTE — ED Triage Notes (Signed)
Pt states he needs a pain medinice for his hemorrhoids.

## 2018-09-03 NOTE — Discharge Instructions (Signed)
Exam is overall reassuring here today.  Continue with antibiotics as previously prescribed. Sitz baths may also be helpful.  Stool softener daily to help prevent pain with Bm's.  Please follow up with your primary care provider for recheck as may need further evaluation or general surgery referral if symptoms persist.  Any worsening of pain please go to the ER.

## 2018-09-14 ENCOUNTER — Telehealth: Payer: Self-pay | Admitting: Family

## 2018-09-14 NOTE — Telephone Encounter (Signed)
COVID-19 Pre-Screening Questions: ° °Do you currently have a fever (>100 °F), chills or unexplained body aches? N ° °Are you currently experiencing new cough, shortness of breath, sore throat, runny nose? N °•  °Have you recently travelled outside the state of [place] in the last 14 days? N  °•  °Have you been in contact with someone that is currently pending confirmation of Covid19 testing or has been confirmed to have the Covid19 virus?  N ° °**If the patient answers NO to ALL questions -  advise the patient to please call the clinic before coming to the office should any symptoms develop.  ° ° ° °

## 2018-09-15 ENCOUNTER — Ambulatory Visit (INDEPENDENT_AMBULATORY_CARE_PROVIDER_SITE_OTHER): Payer: 59 | Admitting: Family

## 2018-09-15 ENCOUNTER — Encounter: Payer: Self-pay | Admitting: Family

## 2018-09-15 ENCOUNTER — Other Ambulatory Visit: Payer: Self-pay

## 2018-09-15 VITALS — BP 161/91 | HR 64 | Temp 98.4°F

## 2018-09-15 DIAGNOSIS — I10 Essential (primary) hypertension: Secondary | ICD-10-CM

## 2018-09-15 DIAGNOSIS — F17200 Nicotine dependence, unspecified, uncomplicated: Secondary | ICD-10-CM | POA: Diagnosis not present

## 2018-09-15 DIAGNOSIS — Z0000 Encounter for general adult medical examination without abnormal findings: Secondary | ICD-10-CM

## 2018-09-15 DIAGNOSIS — B20 Human immunodeficiency virus [HIV] disease: Secondary | ICD-10-CM

## 2018-09-15 DIAGNOSIS — Z23 Encounter for immunization: Secondary | ICD-10-CM | POA: Diagnosis not present

## 2018-09-15 MED ORDER — ODEFSEY 200-25-25 MG PO TABS: 1.0000 | ORAL_TABLET | Freq: Every day | ORAL | 5 refills | Status: DC

## 2018-09-15 NOTE — Patient Instructions (Addendum)
Nice to see you!  We will check your blood work today.  Please continue to take your [facility] as prescribed.  Refills of medication of been sent to the pharmacy.  Schedule a time in September to get your flu shot.  Plan for follow-up in 4 months or sooner if needed with lab work 1 to 2 weeks prior to your appointment.  Have a great day and stay safe!

## 2018-09-15 NOTE — Progress Notes (Signed)
Subjective:    Patient ID: Ryan Cruz, male    DOB: 09-Nov-1965, 53 y.o.   MRN: [id number]  Chief Complaint  Patient presents with  . HIV Positive/AIDS     HPI:  Ryan Cruz is a 53 y.o. male with HIV disease who was last seen in the office on 06/02/2018 with improved HIV disease with a viral load that was undetectable and CD4 count of 600.  He was continued on his ART regimen of Odefsey.  He has been seen in the ED on a couple occasions for rectal pain as well as concern for STDs.  Healthcare maintenance due includes Menveo.  Colonoscopy completed on 02/12/2012 next due in 2024.  [name] continues to take his Vernell Leep as prescribed with no adverse side effects or missed doses.  Overall feeling well today with no new concerns or complaints.Denies fevers, chills, night sweats, headaches, changes in vision, neck pain/stiffness, nausea, diarrhea, vomiting, lesions or rashes.  [name] remains covered through [facility] and has no problems with his medication.  Pharmacy.  Denies feelings of being down, depressed, or hopeless recently. Working part time now as needed. Continues to smoke tobacco (3-4 per day) and marijuana. Denies alcohol consumption. Not currently sexually active but does use condoms when he is.    Allergies  Allergen Reactions  . Doxycycline Rash      Outpatient Medications Prior to Visit  Medication Sig Dispense Refill  . acetaminophen (TYLENOL) 325 MG tablet Take 650 mg by mouth every 6 (six) hours as needed.    Marland Kitchen amLODipine (NORVASC) 10 MG tablet TAKE 1 TABLET(10 MG) BY MOUTH DAILY 30 tablet 3  . Cyanocobalamin (VITAMIN B 12 PO) Take 1 tablet by mouth daily.    Marland Kitchen gabapentin (NEURONTIN) 300 MG capsule Take 1 capsule at night for 1 week, then increase to 1 capsule twice a day 60 capsule 5  . hydrochlorothiazide (HYDRODIURIL) 12.5 MG tablet TAKE 1 TABLET(12.5 MG) BY MOUTH DAILY 30 tablet 3  . HYDROcodone-acetaminophen (NORCO/VICODIN) 5-325 MG tablet Take 1 tablet by mouth  every 6 (six) hours as needed for moderate pain or severe pain. 8 tablet 0  . NIFEdipine POWD Please compound into 0.2% ointment to be applied to anal area tid. 100 g 0  . pseudoephedrine-acetaminophen (TYLENOL SINUS) 30-500 MG TABS tablet Take 1 tablet by mouth every 4 (four) hours as needed (sinus congestion).    . traMADol (ULTRAM) 50 MG tablet Take 1 tablet every 12 hours as needed for severe pain 30 tablet 5  . doxycycline (VIBRAMYCIN) 100 MG capsule Take 1 capsule (100 mg total) by mouth 2 (two) times daily. 20 capsule 0  . ODEFSEY 200-25-25 MG TABS tablet TAKE 1 TABLET BY MOUTH DAILY WITH BREAKFAST 30 tablet 2   No facility-administered medications prior to visit.      Past Medical History:  Diagnosis Date  . Anxiety   . Asthma   . Depression   . HIV (human immunodeficiency virus infection) ([place])   . Hypertension   . Hypoglycemia    sugars drop ion     Past Surgical History:  Procedure Laterality Date  . COLONOSCOPY  02/12/2012   Procedure: COLONOSCOPY;  Surgeon: Leighton Ruff, MD;  Location: [facility];  Service: Endoscopy;  Laterality: N/A;  . HERNIA REPAIR     umbilibcal       Review of Systems  Constitutional: Negative for appetite change, chills, fatigue, fever and unexpected weight change.  Eyes: Negative for visual disturbance.  Respiratory: Negative for cough, chest tightness, shortness of breath and wheezing.   Cardiovascular: Negative for chest pain and leg swelling.  Gastrointestinal: Negative for abdominal pain, constipation, diarrhea, nausea and vomiting.  Genitourinary: Negative for dysuria, flank pain, frequency, genital sores, hematuria and urgency.  Skin: Negative for rash.  Allergic/Immunologic: Negative for immunocompromised state.  Neurological: Negative for dizziness and headaches.      Objective:    BP (!) 161/91   Pulse 64   Temp 98.4 F (36.9 C)  Nursing note and vital signs reviewed.  Physical Exam Constitutional:      General:  He is not in acute distress.    Appearance: He is well-developed.  Eyes:     Conjunctiva/sclera: Conjunctivae normal.  Neck:     Musculoskeletal: Neck supple.  Cardiovascular:     Rate and Rhythm: Normal rate and regular rhythm.     Heart sounds: Normal heart sounds. No murmur. No friction rub. No gallop.   Pulmonary:     Effort: Pulmonary effort is normal. No respiratory distress.     Breath sounds: Normal breath sounds. No wheezing or rales.  Chest:     Chest wall: No tenderness.  Abdominal:     General: Bowel sounds are normal.     Palpations: Abdomen is soft.     Tenderness: There is no abdominal tenderness.  Lymphadenopathy:     Cervical: No cervical adenopathy.  Skin:    General: Skin is warm and dry.     Findings: No rash.  Neurological:     Mental Status: He is alert and oriented to person, place, and time.  Psychiatric:        Behavior: Behavior normal.        Thought Content: Thought content normal.        Judgment: Judgment normal.      Depression screen [facility] 2/9 06/02/2018 10/21/2017 11/18/2016 05/26/2016 07/15/2015  Decreased Interest 0 1 1 1  0  Down, Depressed, Hopeless 0 1 1 1  0  PHQ - 2 Score 0 2 2 2  0  Altered sleeping - 1 0 1 -  Tired, decreased energy - 1 1 1  -  Change in appetite - 1 1 0 -  Feeling bad or failure about yourself  - 1 0 1 -  Trouble concentrating - 1 1 2  -  Moving slowly or fidgety/restless - 1 0 1 -  Suicidal thoughts - 1 0 1 -  PHQ-9 Score - 9 5 9  -  Difficult doing work/chores - - Not difficult at all - -       Assessment & Plan:   Problem List Items Addressed This Visit      Cardiovascular and Mediastinum   Essential hypertension    Blood pressure elevated above goal 140/90 today.  No red flags or warning symptoms presently.  Encouraged to monitor blood pressure at home and continue current dose of amlodipine and hydrochlorothiazide.  Medication management per primary team.        Other   Human immunodeficiency virus (HIV)  disease ([place]) - Primary    [name] has adequately controlled HIV disease with good adherence and tolerance to his ART regimen of Odefsey.  No signs/symptoms of opportunistic infection or progressive HIV disease at present.  He has no problems obtaining his medication from pharmacy.  Check blood work today.  Continue current dose of Odefsey.  Plan for follow-up in 4 months or sooner if needed with lab work 1 to 2 weeks prior to  appointment.      Relevant Medications   emtricitabine-rilpivir-tenofovir AF (ODEFSEY) 200-25-25 MG TABS tablet   Other Relevant Orders   T-helper cell (CD4)- ([facility] clinic only)   HIV-1 RNA quant-no reflex-bld   Comprehensive metabolic panel   Comprehensive metabolic panel   HIV-1 RNA quant-no reflex-bld   T-helper cells (CD4) count (not at [facility])   MENINGOCOCCAL MCV4O (Completed)   TOBACCO USER    Continues to smoke approximately 3 to 4 cigarettes/day.  Discussed importance of tobacco cessation to reduce risk of cardiovascular, respiratory, malignant disease in the future.  He is in the precontemplation stage of quitting and has no desire to quit smoking at this time.  Continue to monitor      Healthcare maintenance     Menveo updated today.  Discussed importance of safe sexual practice to reduce risk of acquisition/transmission of STI.  Declines condoms today.       Other Visit Diagnoses    Need for meningococcal vaccination       Relevant Orders   MENINGOCOCCAL MCV4O (Completed)       I have discontinued Koltyn E. Ridlon's doxycycline. I have also changed his Odefsey. Additionally, I am having him maintain his pseudoephedrine-acetaminophen, gabapentin, traMADol, Cyanocobalamin (VITAMIN B 12 PO), acetaminophen, hydrochlorothiazide, amLODipine, HYDROcodone-acetaminophen, and NIFEdipine.   Meds ordered this encounter  Medications  . emtricitabine-rilpivir-tenofovir AF (ODEFSEY) 200-25-25 MG TABS tablet    Sig: Take 1 tablet by mouth daily with breakfast.     Dispense:  30 tablet    Refill:  5    Order Specific Question:   Supervising Provider    Answer:   Carlyle Basques [[id number]]     Follow-up: Return in about 4 months (around 01/15/2019), or if symptoms worsen or fail to improve.   Terri Piedra, MSN, FNP-C Nurse Practitioner [facility] [place] number: [phone]

## 2018-09-15 NOTE — Assessment & Plan Note (Signed)
   Menveo updated today.  Discussed importance of safe sexual practice to reduce risk of acquisition/transmission of STI.  Declines condoms today.

## 2018-09-15 NOTE — Assessment & Plan Note (Signed)
Continues to smoke approximately 3 to 4 cigarettes/day.  Discussed importance of tobacco cessation to reduce risk of cardiovascular, respiratory, malignant disease in the future.  He is in the precontemplation stage of quitting and has no desire to quit smoking at this time.  Continue to monitor

## 2018-09-15 NOTE — Assessment & Plan Note (Signed)
Ryan Cruz has adequately controlled HIV disease with good adherence and tolerance to his ART regimen of Odefsey.  No signs/symptoms of opportunistic infection or progressive HIV disease at present.  He has no problems obtaining his medication from pharmacy.  Check blood work today.  Continue current dose of Odefsey.  Plan for follow-up in 4 months or sooner if needed with lab work 1 to 2 weeks prior to appointment.

## 2018-09-15 NOTE — Assessment & Plan Note (Signed)
Blood pressure elevated above goal 140/90 today.  No red flags or warning symptoms presently.  Encouraged to monitor blood pressure at home and continue current dose of amlodipine and hydrochlorothiazide.  Medication management per primary team.

## 2018-09-20 ENCOUNTER — Other Ambulatory Visit: Payer: 59

## 2018-10-26 ENCOUNTER — Ambulatory Visit: Payer: 59

## 2019-01-18 ENCOUNTER — Other Ambulatory Visit: Payer: 59

## 2019-01-19 ENCOUNTER — Other Ambulatory Visit: Payer: Self-pay

## 2019-01-19 ENCOUNTER — Other Ambulatory Visit: Payer: 59

## 2019-01-19 DIAGNOSIS — B20 Human immunodeficiency virus [HIV] disease: Secondary | ICD-10-CM

## 2019-01-19 LAB — COMPREHENSIVE METABOLIC PANEL: ALT: 14 U/L (ref 9–46)

## 2019-01-20 LAB — T-HELPER CELL (CD4) - (RCID CLINIC ONLY)
CD4 % Helper T Cell: 28 % — ABNORMAL LOW (ref 33–65)
CD4 T Cell Abs: 466 /uL (ref 400–1790)

## 2019-01-28 LAB — COMPREHENSIVE METABOLIC PANEL
AG Ratio: 1.6 (calc) (ref 1.0–2.5)
AST: 14 U/L (ref 10–35)
Albumin: 4.4 g/dL (ref 3.6–5.1)
Alkaline phosphatase (APISO): 53 U/L (ref 35–144)
BUN: 13 mg/dL (ref 7–25)
CO2: 25 mmol/L (ref 20–32)
Calcium: 9.4 mg/dL (ref 8.6–10.3)
Chloride: 108 mmol/L (ref 98–110)
Creat: 1.28 mg/dL (ref 0.70–1.33)
Globulin: 2.8 g/dL (calc) (ref 1.9–3.7)
Glucose, Bld: 121 mg/dL — ABNORMAL HIGH (ref 65–99)
Potassium: 4.3 mmol/L (ref 3.5–5.3)
Sodium: 140 mmol/L (ref 135–146)
Total Bilirubin: 0.9 mg/dL (ref 0.2–1.2)
Total Protein: 7.2 g/dL (ref 6.1–8.1)

## 2019-01-28 LAB — HIV-1 RNA QUANT-NO REFLEX-BLD
HIV 1 RNA Quant: <20 copies/mL
HIV-1 RNA Quant, Log: <1.3 Log copies/mL

## 2019-02-01 ENCOUNTER — Encounter: Payer: 59 | Admitting: Family

## 2019-02-06 ENCOUNTER — Telehealth: Payer: Self-pay

## 2019-02-06 NOTE — Telephone Encounter (Signed)
COVID-19 Pre-Screening Questions:02/06/19   Do you currently have a fever (>100 F), chills or unexplained body aches? NO   Are you currently experiencing new cough, shortness of breath, sore throat, runny nose? NO .  Have you recently travelled outside the state of [place] in the last 14 days? NO .  Have you been in contact with someone that is currently pending confirmation of Covid19 testing or has been confirmed to have the [place] virus? NO   **If the patient answers NO to ALL questions -  advise the patient to please call the clinic before coming to the office should any symptoms develop.

## 2019-02-07 ENCOUNTER — Encounter: Payer: Self-pay | Admitting: Family

## 2019-02-07 ENCOUNTER — Ambulatory Visit (INDEPENDENT_AMBULATORY_CARE_PROVIDER_SITE_OTHER): Payer: 59 | Admitting: Family

## 2019-02-07 ENCOUNTER — Other Ambulatory Visit: Payer: Self-pay

## 2019-02-07 VITALS — BP 143/81 | HR 63 | Temp 97.7°F | Wt 150.4 lb

## 2019-02-07 DIAGNOSIS — Z113 Encounter for screening for infections with a predominantly sexual mode of transmission: Secondary | ICD-10-CM | POA: Diagnosis not present

## 2019-02-07 DIAGNOSIS — Z23 Encounter for immunization: Secondary | ICD-10-CM | POA: Diagnosis not present

## 2019-02-07 DIAGNOSIS — Z0000 Encounter for general adult medical examination without abnormal findings: Secondary | ICD-10-CM | POA: Diagnosis not present

## 2019-02-07 DIAGNOSIS — B20 Human immunodeficiency virus [HIV] disease: Secondary | ICD-10-CM | POA: Diagnosis not present

## 2019-02-07 DIAGNOSIS — Z79899 Other long term (current) drug therapy: Secondary | ICD-10-CM

## 2019-02-07 MED ORDER — ODEFSEY 200-25-25 MG PO TABS: 1.0000 | ORAL_TABLET | Freq: Every day | ORAL | 5 refills | Status: DC

## 2019-02-07 NOTE — Assessment & Plan Note (Signed)
   Influenza vaccination updated today.  Discussed importance of safe sexual practice to reduce risk of STI.  Condoms declined.  Colon cancer screening up-to-date per recommendations.

## 2019-02-07 NOTE — Progress Notes (Signed)
Subjective:    Patient ID: Ryan Cruz, male    DOB: 1966/01/18, 53 y.o.   MRN: [id number]  Chief Complaint  Patient presents with  . Follow-up    questions about COVID-19 vaccine;      HPI:  Ryan Cruz is a 53 y.o. male with HIV disease who was last seen in the office on 09/15/2018 with good adherence and tolerance to his ART regimen of Odefsey.  Viral load at the time was found to be undetectable with CD4 count of 600.  Most recent blood work completed on 01/19/2019 with a viral load that remains undetectable and CD4 count of 466.  Kidney function, liver function, electrolytes within normal ranges.  Healthcare maintenance due includes influenza vaccination and tetanus.  Ryan Cruz continues to take his Vernell Leep as prescribed with no adverse side effects or missed doses since his last office visit.  Overall feeling well today with no new concerns/complaints.  He does have several questions regarding the new coronavirus vaccinations and if he should obtain one. Denies fevers, chills, night sweats, headaches, changes in vision, neck pain/stiffness, nausea, diarrhea, vomiting, lesions or rashes.  Ryan Cruz has no problems obtaining his medication from the pharmacy and remains covered through [place].  He denies any feelings of being down, depressed, or hopeless.  He does continue to smoke marijuana on a daily basis as well as tobacco averaging about 1/4 pack/day.  Currently drinks alcohol socially.  Not currently sexually active.  Would like to update flu shot today.   Allergies  Allergen Reactions  . Doxycycline Rash      Outpatient Medications Prior to Visit  Medication Sig Dispense Refill  . acetaminophen (TYLENOL) 325 MG tablet Take 650 mg by mouth every 6 (six) hours as needed.    Marland Kitchen amLODipine (NORVASC) 10 MG tablet TAKE 1 TABLET(10 MG) BY MOUTH DAILY 30 tablet 3  . Cyanocobalamin (VITAMIN B 12 PO) Take 1 tablet by mouth daily.    . hydrochlorothiazide (HYDRODIURIL) 12.5 MG tablet  TAKE 1 TABLET(12.5 MG) BY MOUTH DAILY 30 tablet 3  . pseudoephedrine-acetaminophen (TYLENOL SINUS) 30-500 MG TABS tablet Take 1 tablet by mouth every 4 (four) hours as needed (sinus congestion).    Marland Kitchen emtricitabine-rilpivir-tenofovir AF (ODEFSEY) 200-25-25 MG TABS tablet Take 1 tablet by mouth daily with breakfast. 30 tablet 5  . gabapentin (NEURONTIN) 300 MG capsule Take 1 capsule at night for 1 week, then increase to 1 capsule twice a day (Patient not taking: Reported on 02/07/2019) 60 capsule 5  . HYDROcodone-acetaminophen (NORCO/VICODIN) 5-325 MG tablet Take 1 tablet by mouth every 6 (six) hours as needed for moderate pain or severe pain. (Patient not taking: Reported on 02/07/2019) 8 tablet 0  . NIFEdipine POWD Please compound into 0.2% ointment to be applied to anal area tid. (Patient not taking: Reported on 02/07/2019) 100 g 0  . traMADol (ULTRAM) 50 MG tablet Take 1 tablet every 12 hours as needed for severe pain (Patient not taking: Reported on 02/07/2019) 30 tablet 5   No facility-administered medications prior to visit.     Past Medical History:  Diagnosis Date  . Anxiety   . Asthma   . Depression   . HIV (human immunodeficiency virus infection) ([place])   . Hypertension   . Hypoglycemia    sugars drop ion     Past Surgical History:  Procedure Laterality Date  . COLONOSCOPY  02/12/2012   Procedure: COLONOSCOPY;  Surgeon: Leighton Ruff, MD;  Location: [facility];  Service: Endoscopy;  Laterality: N/A;  . HERNIA REPAIR     umbilibcal       Review of Systems  Constitutional: Negative for appetite change, chills, fatigue, fever and unexpected weight change.  Eyes: Negative for visual disturbance.  Respiratory: Negative for cough, chest tightness, shortness of breath and wheezing.   Cardiovascular: Negative for chest pain and leg swelling.  Gastrointestinal: Negative for abdominal pain, constipation, diarrhea, nausea and vomiting.  Genitourinary: Negative for dysuria, flank  pain, frequency, genital sores, hematuria and urgency.  Skin: Negative for rash.  Allergic/Immunologic: Negative for immunocompromised state.  Neurological: Negative for dizziness and headaches.      Objective:    BP (!) 143/81   Pulse 63   Temp 97.7 F (36.5 C) (Other (Comment))   Wt 150 lb 6.4 oz (68.2 kg)   BMI 23.56 kg/m  Nursing note and vital signs reviewed.  Physical Exam Constitutional:      General: He is not in acute distress.    Appearance: He is well-developed.  Eyes:     Conjunctiva/sclera: Conjunctivae normal.  Cardiovascular:     Rate and Rhythm: Normal rate and regular rhythm.     Heart sounds: Normal heart sounds. No murmur. No friction rub. No gallop.   Pulmonary:     Effort: Pulmonary effort is normal. No respiratory distress.     Breath sounds: Normal breath sounds. No wheezing or rales.  Chest:     Chest wall: No tenderness.  Abdominal:     General: Bowel sounds are normal.     Palpations: Abdomen is soft.     Tenderness: There is no abdominal tenderness.  Musculoskeletal:     Cervical back: Neck supple.  Lymphadenopathy:     Cervical: No cervical adenopathy.  Skin:    General: Skin is warm and dry.     Findings: No rash.  Neurological:     Mental Status: He is alert and oriented to person, place, and time.  Psychiatric:        Behavior: Behavior normal.        Thought Content: Thought content normal.        Judgment: Judgment normal.      Depression screen [facility] 2/9 02/07/2019 06/02/2018 10/21/2017 11/18/2016 05/26/2016  Decreased Interest 0 0 1 1 1   Down, Depressed, Hopeless 0 0 1 1 1   PHQ - 2 Score 0 0 2 2 2   Altered sleeping - - 1 0 1  Tired, decreased energy - - 1 1 1   Change in appetite - - 1 1 0  Feeling bad or failure about yourself  - - 1 0 1  Trouble concentrating - - 1 1 2   Moving slowly or fidgety/restless - - 1 0 1  Suicidal thoughts - - 1 0 1  PHQ-9 Score - - 9 5 9   Difficult doing work/chores - - - Not difficult at all -         Assessment & Plan:    Patient Active Problem List   Diagnosis Date Noted  . Healthcare maintenance 04/26/2018  . Hepatitis B immune 11/18/2016  . Medically noncompliant   . 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 Other headache syndrome   . Optic neuritis 04/16/2016  . Chest pain 11/20/2015  . Hypoglycemia 01/29/2014  . Allergic sinusitis 06/29/2012  . Pilonidal cyst 01/04/2012  . Depression 06/25/2010  . 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 Essential hypertension 12/12/2009  . Ventral hernia  03/28/2009  . NEUROPATHY, NOS 04/30/2008  . DENTAL CARIES 12/14/2007  . TOBACCO USER 06/01/2007  . VERTIGO 05/13/2007  . Human immunodeficiency virus (HIV) disease ([place]) 02/24/2006  . HYPERLIPIDEMIA 02/24/2006  . Rectal bleeding 11/19/2005  . ASTHMA 12/11/1994     Problem List Items Addressed This Visit      Other   Human immunodeficiency virus (HIV) disease ([place])    Ryan Cruz continues to have well-controlled HIV disease with good adherence and tolerance to his ART regimen of Odefsey.  No signs/symptoms of opportunistic infection or progressive HIV disease.  We reviewed his lab work and discussed the plan of care.  We discussed the coronavirus vaccination including efficacy and side effects.  Continue current dose of Odefsey.  Plan for follow-up in 4 months or sooner if needed with lab work 1 to 2 weeks prior to appointment.      Relevant Medications   emtricitabine-rilpivir-tenofovir AF (ODEFSEY) 200-25-25 MG TABS tablet   Healthcare maintenance     Influenza vaccination updated today.  Discussed importance of safe sexual practice to reduce risk of STI.  Condoms declined.  Colon cancer screening up-to-date per recommendations.       Other Visit Diagnoses    Screening for STDs (sexually transmitted diseases)    -  Primary       I have discontinued Ryan Cruz's gabapentin, traMADol, HYDROcodone-acetaminophen, and  NIFEdipine. I am also having him maintain his pseudoephedrine-acetaminophen, Cyanocobalamin (VITAMIN B 12 PO), acetaminophen, hydrochlorothiazide, amLODipine, and Odefsey.   Meds ordered this encounter  Medications  . emtricitabine-rilpivir-tenofovir AF (ODEFSEY) 200-25-25 MG TABS tablet    Sig: Take 1 tablet by mouth daily with breakfast.    Dispense:  30 tablet    Refill:  5    Order Specific Question:   Supervising Provider    Answer:   Carlyle Basques [[id number]]     Follow-up: Return in about 4 months (around 06/08/2019), or if symptoms worsen or fail to improve.   Terri Piedra, MSN, FNP-C Nurse Practitioner [facility] [place] number: [phone]

## 2019-02-07 NOTE — Patient Instructions (Addendum)
Nice to see you.  Please continue to take your [facility] as prescribed.  Refills have been sent to the pharmacy.  We will plan to follow up in 4 months or sooner if needed with lab work 1-2 weeks prior to your appointment.   Have a great day stay safe!  Happy New Year!

## 2019-02-07 NOTE — Assessment & Plan Note (Signed)
Mr. Dikes continues to have well-controlled HIV disease with good adherence and tolerance to his ART regimen of Odefsey.  No signs/symptoms of opportunistic infection or progressive HIV disease.  We reviewed his lab work and discussed the plan of care.  We discussed the coronavirus vaccination including efficacy and side effects.  Continue current dose of Odefsey.  Plan for follow-up in 4 months or sooner if needed with lab work 1 to 2 weeks prior to appointment.

## 2019-05-22 ENCOUNTER — Other Ambulatory Visit: Payer: Self-pay

## 2019-05-22 ENCOUNTER — Other Ambulatory Visit (HOSPITAL_COMMUNITY)
Admission: RE | Admit: 2019-05-22 | Discharge: 2019-05-22 | Disposition: A | Discharge status: Home / Self Care | Payer: 59 | Source: Ambulatory Visit | Attending: Family | Admitting: Family

## 2019-05-22 ENCOUNTER — Other Ambulatory Visit: Payer: 59

## 2019-05-22 DIAGNOSIS — Z113 Encounter for screening for infections with a predominantly sexual mode of transmission: Secondary | ICD-10-CM | POA: Insufficient documentation

## 2019-05-22 DIAGNOSIS — Z79899 Other long term (current) drug therapy: Secondary | ICD-10-CM

## 2019-05-22 DIAGNOSIS — B20 Human immunodeficiency virus [HIV] disease: Secondary | ICD-10-CM

## 2019-05-23 LAB — T-HELPER CELL (CD4) - (RCID CLINIC ONLY)
CD4 % Helper T Cell: 28 % — ABNORMAL LOW (ref 33–65)
CD4 T Cell Abs: 560 /uL (ref 400–1790)

## 2019-05-23 LAB — URINE CYTOLOGY ANCILLARY ONLY
Chlamydia: NEGATIVE
Comment: NEGATIVE
Comment: NORMAL
Neisseria Gonorrhea: NEGATIVE

## 2019-05-24 LAB — RPR: RPR Ser Ql: NONREACTIVE

## 2019-05-24 LAB — COMPREHENSIVE METABOLIC PANEL
AG Ratio: 1.6 (calc) (ref 1.0–2.5)
ALT: 14 U/L (ref 9–46)
AST: 16 U/L (ref 10–35)
Albumin: 4.4 g/dL (ref 3.6–5.1)
Alkaline phosphatase (APISO): 53 U/L (ref 35–144)
BUN: 11 mg/dL (ref 7–25)
CO2: 25 mmol/L (ref 20–32)
Calcium: 9.5 mg/dL (ref 8.6–10.3)
Chloride: 110 mmol/L (ref 98–110)
Creat: 1.16 mg/dL (ref 0.70–1.33)
Globulin: 2.8 g/dL (calc) (ref 1.9–3.7)
Glucose, Bld: 91 mg/dL (ref 65–99)
Potassium: 3.9 mmol/L (ref 3.5–5.3)
Sodium: 141 mmol/L (ref 135–146)
Total Bilirubin: 1.1 mg/dL (ref 0.2–1.2)
Total Protein: 7.2 g/dL (ref 6.1–8.1)

## 2019-05-24 LAB — CBC
HCT: 32.4 % — ABNORMAL LOW (ref 38.5–50.0)
Hemoglobin: 11 g/dL — ABNORMAL LOW (ref 13.2–17.1)
MCH: 34.8 pg — ABNORMAL HIGH (ref 27.0–33.0)
MCHC: 34 g/dL (ref 32.0–36.0)
MCV: 102.5 fL — ABNORMAL HIGH (ref 80.0–100.0)
MPV: 10.2 fL (ref 7.5–12.5)
Platelets: 309 10*3/uL (ref 140–400)
RBC: 3.16 10*6/uL — ABNORMAL LOW (ref 4.20–5.80)
RDW: 12 % (ref 11.0–15.0)
WBC: 6.7 10*3/uL (ref 3.8–10.8)

## 2019-05-24 LAB — LIPID PANEL
Cholesterol: 162 mg/dL (ref <200)
HDL: 44 mg/dL (ref >=40)
LDL Cholesterol (Calc): 99 mg/dL (calc)
Non-HDL Cholesterol (Calc): 118 mg/dL (calc) (ref <130)
Total CHOL/HDL Ratio: 3.7 (calc) (ref <5.0)
Triglycerides: 99 mg/dL (ref <150)

## 2019-05-24 LAB — HIV-1 RNA QUANT-NO REFLEX-BLD
HIV 1 RNA Quant: <20 copies/mL
HIV-1 RNA Quant, Log: <1.3 Log copies/mL

## 2019-06-02 ENCOUNTER — Telehealth: Payer: Self-pay

## 2019-06-02 NOTE — Telephone Encounter (Signed)
COVID-19 Pre-Screening Questions:06/02/19  Do you currently have a fever (>100 F), chills or unexplained body aches?NO  Are you currently experiencing new cough, shortness of breath, sore throat, runny nose? NO .  Have you recently travelled outside the state of [place] in the last 14 days? NO  .  Have you been in contact with someone that is currently pending confirmation of Covid19 testing or has been confirmed to have the [place] virus?  NO **If the patient answers NO to ALL questions -  advise the patient to please call the clinic before coming to the office should any symptoms develop.

## 2019-06-05 ENCOUNTER — Other Ambulatory Visit: Payer: Self-pay

## 2019-06-05 ENCOUNTER — Ambulatory Visit (INDEPENDENT_AMBULATORY_CARE_PROVIDER_SITE_OTHER): Payer: 59 | Admitting: Family

## 2019-06-05 ENCOUNTER — Encounter: Payer: Self-pay | Admitting: Family

## 2019-06-05 VITALS — BP 143/82 | HR 67 | Wt 146.0 lb

## 2019-06-05 DIAGNOSIS — L989 Disorder of the skin and subcutaneous tissue, unspecified: Secondary | ICD-10-CM | POA: Insufficient documentation

## 2019-06-05 DIAGNOSIS — Z113 Encounter for screening for infections with a predominantly sexual mode of transmission: Secondary | ICD-10-CM

## 2019-06-05 DIAGNOSIS — I10 Essential (primary) hypertension: Secondary | ICD-10-CM | POA: Diagnosis not present

## 2019-06-05 DIAGNOSIS — B20 Human immunodeficiency virus [HIV] disease: Secondary | ICD-10-CM

## 2019-06-05 DIAGNOSIS — Z23 Encounter for immunization: Secondary | ICD-10-CM

## 2019-06-05 DIAGNOSIS — Z0000 Encounter for general adult medical examination without abnormal findings: Secondary | ICD-10-CM | POA: Diagnosis not present

## 2019-06-05 MED ORDER — ODEFSEY 200-25-25 MG PO TABS: 1.0000 | ORAL_TABLET | Freq: Every day | ORAL | 5 refills | Status: DC

## 2019-06-05 MED ORDER — AMLODIPINE BESYLATE 10 MG PO TABS: ORAL_TABLET | ORAL | 3 refills | Status: DC

## 2019-06-05 NOTE — Patient Instructions (Signed)
Nice to see you.  Continue to take your [facility] as prescribed daily.  Refills have been sent to the pharmacy.  A referral to podiatry has been sent to evaluate your foot.   Plan for follow up in 4 months or sooner if needed with lab work 1-2 weeks prior to your appointment.   Have a great day and stay safe!

## 2019-06-05 NOTE — Assessment & Plan Note (Signed)
Ryan Cruz has new onset foot lesion/nodule which may possibly be a plantar wart.  Will refer to podiatry for further evaluation and treatment.  Consider small little donut pad as needed to relieve pressure if necessary.

## 2019-06-05 NOTE — Assessment & Plan Note (Signed)
Blood pressure appears adequately controlled and near goal of 140/90.  Continue current dose of hydrochlorothiazide and amlodipine per primary care.

## 2019-06-05 NOTE — Progress Notes (Signed)
Subjective:    Patient ID: Ryan Cruz, male    DOB: 1965-12-07, 54 y.o.   MRN: [id number]  Chief Complaint  Patient presents with  . Follow-up    needs refills; declined condoms;     HPI:  Ryan Cruz is a 54 y.o. male with HIV disease last seen in the office on 02/07/2019 with good adherence and tolerance to his ART regimen of Odefsey.  Blood work at the time showed a CD4 count of 466 with a viral load that was undetectable.  Most recent blood work completed on 05/22/2019 with a viral load that remains undetectable and CD4 count of 560.  Kidney function, liver function, and electrolytes all within normal ranges.  Lipid profile with triglycerides 99, LDL 99, and HDL of 44.  Ryan Cruz continues to take his Vernell Leep as prescribed with no adverse side effects or missed doses since his last office visit.  Overall feeling well today.  He has concern for a lesion located on his right foot that has been going on for several weeks located on the lateral aspect on the plantar surface.  Experiences discomfort/pain when pressure is placed on it.  No treatments attempted to date. Denies fevers, chills, night sweats, headaches, changes in vision, neck pain/stiffness, nausea, diarrhea, vomiting, lesions or rashes.  Ryan Cruz has no problems obtaining his medication from the pharmacy and remains covered by [place].  Denies feelings of being down, depressed, or hopeless recently.  He does smoke marijuana daily and approximately 1/4 pack/cigarettes/day.  Alcohol use is on occasion.  Not currently sexually active and declines condoms.  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  He has received his Moderna vaccinations for coronavirus.  Due for a dentist to visit   Allergies  Allergen Reactions  . Doxycycline Rash    Outpatient Medications Prior to Visit  Medication Sig Dispense Refill  . emtricitabine-rilpivir-tenofovir AF (ODEFSEY) 200-25-25 MG TABS tablet Take 1 tablet by mouth daily with  breakfast. 30 tablet 5  . acetaminophen (TYLENOL) 325 MG tablet Take 650 mg by mouth every 6 (six) hours as needed.    . Cyanocobalamin (VITAMIN B 12 PO) Take 1 tablet by mouth daily.    . hydrochlorothiazide (HYDRODIURIL) 12.5 MG tablet TAKE 1 TABLET(12.5 MG) BY MOUTH DAILY 30 tablet 3  . pseudoephedrine-acetaminophen (TYLENOL SINUS) 30-500 MG TABS tablet Take 1 tablet by mouth every 4 (four) hours as needed (sinus congestion).    Marland Kitchen amLODipine (NORVASC) 10 MG tablet TAKE 1 TABLET(10 MG) BY MOUTH DAILY 30 tablet 3   No facility-administered medications prior to visit.     Past Medical History:  Diagnosis Date  . Anxiety   . Asthma   . Depression   . HIV (human immunodeficiency virus infection) ([place])   . Hypertension   . Hypoglycemia    sugars drop ion     Past Surgical History:  Procedure Laterality Date  . COLONOSCOPY  02/12/2012   Procedure: COLONOSCOPY;  Surgeon: Leighton Ruff, MD;  Location: [facility];  Service: Endoscopy;  Laterality: N/A;  . HERNIA REPAIR     umbilibcal     Review of Systems  Constitutional: Negative for appetite change, chills, fatigue, fever and unexpected weight change.  Eyes: Negative for visual disturbance.  Respiratory: Negative for cough, chest tightness, shortness of breath and wheezing.   Cardiovascular: Negative for chest pain and leg swelling.  Gastrointestinal: Negative for abdominal pain, constipation, diarrhea, nausea and vomiting.  Genitourinary: Negative for dysuria, flank pain,  frequency, genital sores, hematuria and urgency.  Skin: Negative for rash.  Allergic/Immunologic: Negative for immunocompromised state.  Neurological: Negative for dizziness and headaches.      Objective:    BP (!) 143/82   Pulse 67   Wt 146 lb (66.2 kg)   BMI 22.87 kg/m  Nursing note and vital signs reviewed.  Physical Exam Constitutional:      General: He is not in acute distress.    Appearance: He is well-developed.  Eyes:      Conjunctiva/sclera: Conjunctivae normal.  Cardiovascular:     Rate and Rhythm: Normal rate and regular rhythm.     Heart sounds: Normal heart sounds. No murmur. No friction rub. No gallop.   Pulmonary:     Effort: Pulmonary effort is normal. No respiratory distress.     Breath sounds: Normal breath sounds. No wheezing or rales.  Chest:     Chest wall: No tenderness.  Abdominal:     General: Bowel sounds are normal.     Palpations: Abdomen is soft.     Tenderness: There is no abdominal tenderness.  Musculoskeletal:     Cervical back: Neck supple.     Comments: Right foot with small plantar wart looking like lesion/nodule located on the lateral aspect of the plantar surface that is firm to the touch.  There is no drainage.   Lymphadenopathy:     Cervical: No cervical adenopathy.  Skin:    General: Skin is warm and dry.     Findings: No rash.  Neurological:     Mental Status: He is alert and oriented to person, place, and time.  Psychiatric:        Behavior: Behavior normal.        Thought Content: Thought content normal.        Judgment: Judgment normal.      Depression screen [facility] 2/9 06/05/2019 02/07/2019 06/02/2018 10/21/2017 11/18/2016  Decreased Interest 0 0 0 1 1  Down, Depressed, Hopeless 0 0 0 1 1  PHQ - 2 Score 0 0 0 2 2  Altered sleeping - - - 1 0  Tired, decreased energy - - - 1 1  Change in appetite - - - 1 1  Feeling bad or failure about yourself  - - - 1 0  Trouble concentrating - - - 1 1  Moving slowly or fidgety/restless - - - 1 0  Suicidal thoughts - - - 1 0  PHQ-9 Score - - - 9 5  Difficult doing work/chores - - - - Not difficult at all       Assessment & Plan:    Patient Active Problem List   Diagnosis Date Noted  . Foot lesion 06/05/2019  . Healthcare maintenance 04/26/2018  . Hepatitis B immune 11/18/2016  . Medically noncompliant   . 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 Other headache syndrome   .  Optic neuritis 04/16/2016  . Chest pain 11/20/2015  . Hypoglycemia 01/29/2014  . Allergic sinusitis 06/29/2012  . Pilonidal cyst 01/04/2012  . Depression 06/25/2010  . 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 Essential hypertension 12/12/2009  . Ventral hernia 03/28/2009  . NEUROPATHY, NOS 04/30/2008  . DENTAL CARIES 12/14/2007  . TOBACCO USER 06/01/2007  . VERTIGO 05/13/2007  . Human immunodeficiency virus (HIV) disease ([place]) 02/24/2006  . HYPERLIPIDEMIA 02/24/2006  . Rectal bleeding 11/19/2005  . ASTHMA 12/11/1994     Problem List Items Addressed This Visit  Cardiovascular and Mediastinum   Essential hypertension    Blood pressure appears adequately controlled and near goal of 140/90.  Continue current dose of hydrochlorothiazide and amlodipine per primary care.      Relevant Medications   amLODipine (NORVASC) 10 MG tablet     Other   Human immunodeficiency virus (HIV) disease ([place]) - Primary    Ryan Cruz continues to have well-controlled HIV disease with good adherence and tolerance to his ART regimen of Odefsey.  No signs/symptoms of opportunistic infection or progressive HIV disease.  We reviewed his lab work and discussed the plan of care.  Continue current dose of Odefsey.  Plan for follow-up in 4 months or sooner if needed with lab work 1 to 2 weeks prior to appointment.      Relevant Medications   emtricitabine-rilpivir-tenofovir AF (ODEFSEY) 200-25-25 MG TABS tablet   Other Relevant Orders   HIV-1 RNA quant-no reflex-bld   T-helper cell (CD4)- ([facility] clinic only)   Comprehensive metabolic panel   Healthcare maintenance     Covid vaccinations up-to-date  Discussed importance of safe sexual practice to reduce risk of STI.  Condoms declined.  Second dose of Menveo updated today.      Foot lesion    Ryan Cruz has new onset foot lesion/nodule which may possibly be a plantar wart.  Will refer to podiatry for further evaluation and treatment.  Consider small little  donut pad as needed to relieve pressure if necessary.      Relevant Orders   Ambulatory referral to Podiatry    Other Visit Diagnoses    Screening for STDs (sexually transmitted diseases)       Relevant Orders   RPR   Need for meningococcal vaccination       Relevant Orders   MENINGOCOCCAL MCV4O (Completed)       I have changed Justine E. Cosma's amLODipine. I am also having him maintain his pseudoephedrine-acetaminophen, Cyanocobalamin (VITAMIN B 12 PO), acetaminophen, hydrochlorothiazide, and Odefsey.   Meds ordered this encounter  Medications  . emtricitabine-rilpivir-tenofovir AF (ODEFSEY) 200-25-25 MG TABS tablet    Sig: Take 1 tablet by mouth daily with breakfast.    Dispense:  30 tablet    Refill:  5    Order Specific Question:   Supervising Provider    Answer:   Carlyle Basques [[id number]]  . amLODipine (NORVASC) 10 MG tablet    Sig: Take 1 tablet by mouth daily.    Dispense:  30 tablet    Refill:  3    Order Specific Question:   Supervising Provider    Answer:   Carlyle Basques [[id number]]     Follow-up: Return in about 4 months (around 10/05/2019).   Terri Piedra, MSN, FNP-C Nurse Practitioner [facility] [place] number: [phone]

## 2019-06-05 NOTE — Assessment & Plan Note (Signed)
   Covid vaccinations up-to-date  Discussed importance of safe sexual practice to reduce risk of STI.  Condoms declined.  Second dose of Menveo updated today.

## 2019-06-05 NOTE — Assessment & Plan Note (Signed)
Mr. Butkovich continues to have well-controlled HIV disease with good adherence and tolerance to his ART regimen of Odefsey.  No signs/symptoms of opportunistic infection or progressive HIV disease.  We reviewed his lab work and discussed the plan of care.  Continue current dose of Odefsey.  Plan for follow-up in 4 months or sooner if needed with lab work 1 to 2 weeks prior to appointment.

## 2019-06-09 ENCOUNTER — Telehealth: Payer: Self-pay | Admitting: Pharmacy Technician

## 2019-06-09 NOTE — Telephone Encounter (Signed)
[facility] Patient Advocate Encounter  Patient's [employer] Advancing Access has expired. He currently has 10 days worth of medication. He will come in Monday morning to drop off income documents. Due to last year's income cut backs, he may qualify for adap. We will need the adap denial letter and income documents before Philis Fendt will approve. Patient is aware of the steps required.

## 2019-06-22 ENCOUNTER — Other Ambulatory Visit: Payer: Self-pay

## 2019-06-22 ENCOUNTER — Ambulatory Visit (INDEPENDENT_AMBULATORY_CARE_PROVIDER_SITE_OTHER): Payer: 59 | Admitting: Podiatry

## 2019-06-22 VITALS — Temp 97.7°F

## 2019-06-22 DIAGNOSIS — M21962 Unspecified acquired deformity of left lower leg: Secondary | ICD-10-CM

## 2019-06-22 DIAGNOSIS — M7752 Other enthesopathy of left foot: Secondary | ICD-10-CM

## 2019-06-22 DIAGNOSIS — Q828 Other specified congenital malformations of skin: Secondary | ICD-10-CM

## 2019-08-03 ENCOUNTER — Encounter: Payer: Self-pay | Admitting: Family

## 2019-08-14 ENCOUNTER — Emergency Department (HOSPITAL_COMMUNITY): Payer: Self-pay

## 2019-08-14 ENCOUNTER — Observation Stay (HOSPITAL_COMMUNITY): Payer: Self-pay

## 2019-08-14 ENCOUNTER — Other Ambulatory Visit: Payer: Self-pay

## 2019-08-14 ENCOUNTER — Encounter (HOSPITAL_COMMUNITY): Payer: Self-pay

## 2019-08-14 ENCOUNTER — Inpatient Hospital Stay (HOSPITAL_COMMUNITY)
Admission: EM | Admit: 2019-08-14 | Discharge: 2019-08-19 | DRG: 372 | Disposition: A | Discharge status: Home / Self Care | Payer: 59 | Attending: Family Medicine | Admitting: Family Medicine

## 2019-08-14 DIAGNOSIS — Z21 Asymptomatic human immunodeficiency virus [HIV] infection status: Secondary | ICD-10-CM | POA: Diagnosis present

## 2019-08-14 DIAGNOSIS — R079 Chest pain, unspecified: Secondary | ICD-10-CM | POA: Diagnosis present

## 2019-08-14 DIAGNOSIS — Z79899 Other long term (current) drug therapy: Secondary | ICD-10-CM

## 2019-08-14 DIAGNOSIS — J45909 Unspecified asthma, uncomplicated: Secondary | ICD-10-CM | POA: Diagnosis present

## 2019-08-14 DIAGNOSIS — D539 Nutritional anemia, unspecified: Secondary | ICD-10-CM | POA: Diagnosis present

## 2019-08-14 DIAGNOSIS — R112 Nausea with vomiting, unspecified: Secondary | ICD-10-CM

## 2019-08-14 DIAGNOSIS — Z66 Do not resuscitate: Secondary | ICD-10-CM | POA: Diagnosis present

## 2019-08-14 DIAGNOSIS — Z881 Allergy status to other antibiotic agents status: Secondary | ICD-10-CM

## 2019-08-14 DIAGNOSIS — Z833 Family history of diabetes mellitus: Secondary | ICD-10-CM

## 2019-08-14 DIAGNOSIS — K449 Diaphragmatic hernia without obstruction or gangrene: Secondary | ICD-10-CM | POA: Diagnosis present

## 2019-08-14 DIAGNOSIS — E46 Unspecified protein-calorie malnutrition: Secondary | ICD-10-CM | POA: Diagnosis present

## 2019-08-14 DIAGNOSIS — F1290 Cannabis use, unspecified, uncomplicated: Secondary | ICD-10-CM | POA: Diagnosis present

## 2019-08-14 DIAGNOSIS — K2100 Gastro-esophageal reflux disease with esophagitis, without bleeding: Secondary | ICD-10-CM | POA: Diagnosis present

## 2019-08-14 DIAGNOSIS — M25519 Pain in unspecified shoulder: Secondary | ICD-10-CM | POA: Diagnosis present

## 2019-08-14 DIAGNOSIS — Z20822 Contact with and (suspected) exposure to covid-19: Secondary | ICD-10-CM | POA: Diagnosis present

## 2019-08-14 DIAGNOSIS — E785 Hyperlipidemia, unspecified: Secondary | ICD-10-CM | POA: Diagnosis present

## 2019-08-14 DIAGNOSIS — A071 Giardiasis [lambliasis]: Principal | ICD-10-CM | POA: Diagnosis present

## 2019-08-14 DIAGNOSIS — K269 Duodenal ulcer, unspecified as acute or chronic, without hemorrhage or perforation: Secondary | ICD-10-CM | POA: Diagnosis present

## 2019-08-14 DIAGNOSIS — Z8249 Family history of ischemic heart disease and other diseases of the circulatory system: Secondary | ICD-10-CM

## 2019-08-14 DIAGNOSIS — R748 Abnormal levels of other serum enzymes: Secondary | ICD-10-CM | POA: Diagnosis present

## 2019-08-14 DIAGNOSIS — F17210 Nicotine dependence, cigarettes, uncomplicated: Secondary | ICD-10-CM | POA: Diagnosis present

## 2019-08-14 DIAGNOSIS — R590 Localized enlarged lymph nodes: Secondary | ICD-10-CM | POA: Diagnosis present

## 2019-08-14 DIAGNOSIS — Z6821 Body mass index (BMI) 21.0-21.9, adult: Secondary | ICD-10-CM

## 2019-08-14 DIAGNOSIS — N179 Acute kidney failure, unspecified: Secondary | ICD-10-CM | POA: Diagnosis present

## 2019-08-14 DIAGNOSIS — I10 Essential (primary) hypertension: Secondary | ICD-10-CM

## 2019-08-14 DIAGNOSIS — E860 Dehydration: Secondary | ICD-10-CM | POA: Diagnosis present

## 2019-08-14 DIAGNOSIS — R64 Cachexia: Secondary | ICD-10-CM | POA: Diagnosis present

## 2019-08-14 DIAGNOSIS — K222 Esophageal obstruction: Secondary | ICD-10-CM | POA: Diagnosis present

## 2019-08-14 DIAGNOSIS — K2970 Gastritis, unspecified, without bleeding: Secondary | ICD-10-CM | POA: Diagnosis present

## 2019-08-14 LAB — URINALYSIS, ROUTINE W REFLEX MICROSCOPIC
Bilirubin Urine: NEGATIVE
Glucose, UA: NEGATIVE mg/dL
Ketones, ur: NEGATIVE mg/dL
Leukocytes,Ua: NEGATIVE
Nitrite: NEGATIVE
Protein, ur: 100 mg/dL — AB
Specific Gravity, Urine: 1.02 (ref 1.005–1.030)
pH: 5 (ref 5.0–8.0)

## 2019-08-14 LAB — COMPREHENSIVE METABOLIC PANEL
ALT: 14 U/L (ref 0–44)
AST: 13 U/L — ABNORMAL LOW (ref 15–41)
Albumin: 4.9 g/dL (ref 3.5–5.0)
Alkaline Phosphatase: 75 U/L (ref 38–126)
Anion gap: 17 — ABNORMAL HIGH (ref 5–15)
BUN: 21 mg/dL — ABNORMAL HIGH (ref 6–20)
CO2: 14 mmol/L — ABNORMAL LOW (ref 22–32)
Calcium: 10 mg/dL (ref 8.9–10.3)
Chloride: 106 mmol/L (ref 98–111)
Creatinine, Ser: 2.92 mg/dL — ABNORMAL HIGH (ref 0.61–1.24)
GFR calc Af Amer: 27 mL/min — ABNORMAL LOW (ref >60)
GFR calc non Af Amer: 24 mL/min — ABNORMAL LOW (ref >60)
Glucose, Bld: 111 mg/dL — ABNORMAL HIGH (ref 70–99)
Potassium: 3.7 mmol/L (ref 3.5–5.1)
Sodium: 137 mmol/L (ref 135–145)
Total Bilirubin: 0.9 mg/dL (ref 0.3–1.2)
Total Protein: 9.9 g/dL — ABNORMAL HIGH (ref 6.5–8.1)

## 2019-08-14 LAB — TROPONIN I (HIGH SENSITIVITY)
Troponin I (High Sensitivity): 7 ng/L (ref <18)
Troponin I (High Sensitivity): 7 ng/L (ref <18)

## 2019-08-14 LAB — CBC
HCT: 43.9 % (ref 39.0–52.0)
Hemoglobin: 14.3 g/dL (ref 13.0–17.0)
MCH: 34.5 pg — ABNORMAL HIGH (ref 26.0–34.0)
MCHC: 32.6 g/dL (ref 30.0–36.0)
MCV: 106 fL — ABNORMAL HIGH (ref 80.0–100.0)
Platelets: 452 10*3/uL — ABNORMAL HIGH (ref 150–400)
RBC: 4.14 MIL/uL — ABNORMAL LOW (ref 4.22–5.81)
RDW: 12.6 % (ref 11.5–15.5)
WBC: 16.1 10*3/uL — ABNORMAL HIGH (ref 4.0–10.5)
nRBC: 0 % (ref 0.0–0.2)

## 2019-08-14 LAB — SARS CORONAVIRUS 2 BY RT PCR (HOSPITAL ORDER, PERFORMED IN ~~LOC~~ HOSPITAL LAB): SARS Coronavirus 2: NEGATIVE

## 2019-08-14 LAB — LACTIC ACID, PLASMA: Lactic Acid, Venous: 1.3 mmol/L (ref 0.5–1.9)

## 2019-08-14 LAB — LIPASE, BLOOD: Lipase: 156 U/L — ABNORMAL HIGH (ref 11–51)

## 2019-08-14 LAB — CBG MONITORING, ED: Glucose-Capillary: 127 mg/dL — ABNORMAL HIGH (ref 70–99)

## 2019-08-14 MED ORDER — ALUM & MAG HYDROXIDE-SIMETH 200-200-20 MG/5ML PO SUSP
30.0000 mL | Freq: Once | ORAL | Status: AC
  Administered 2019-08-14: 30 mL via ORAL
  Filled 2019-08-14: qty 30

## 2019-08-14 MED ORDER — PANTOPRAZOLE SODIUM 40 MG IV SOLR: 40.0000 mg | INTRAVENOUS | Status: DC

## 2019-08-14 MED ORDER — AMLODIPINE BESYLATE 10 MG PO TABS
10.0000 mg | ORAL_TABLET | Freq: Every day | ORAL | Status: DC
  Administered 2019-08-15 – 2019-08-19 (×5): 10 mg via ORAL
  Filled 2019-08-14 (×5): qty 1

## 2019-08-14 MED ORDER — ACETAMINOPHEN 325 MG PO TABS
650.0000 mg | ORAL_TABLET | Freq: Four times a day (QID) | ORAL | Status: DC | PRN
  Administered 2019-08-14 – 2019-08-16 (×2): 650 mg via ORAL
  Filled 2019-08-14 (×2): qty 2

## 2019-08-14 MED ORDER — EMTRICITAB-RILPIVIR-TENOFOV AF 200-25-25 MG PO TABS
1.0000 | ORAL_TABLET | Freq: Every day | ORAL | Status: DC
  Administered 2019-08-15 – 2019-08-18 (×4): 1 via ORAL
  Filled 2019-08-14 (×4): qty 1

## 2019-08-14 MED ORDER — ONDANSETRON HCL 4 MG PO TABS
4.0000 mg | ORAL_TABLET | Freq: Four times a day (QID) | ORAL | Status: DC | PRN
  Administered 2019-08-16 – 2019-08-17 (×2): 4 mg via ORAL
  Filled 2019-08-14 (×2): qty 1

## 2019-08-14 MED ORDER — ONDANSETRON HCL 4 MG/2ML IJ SOLN
4.0000 mg | Freq: Once | INTRAMUSCULAR | Status: AC
  Administered 2019-08-14: 4 mg via INTRAVENOUS
  Filled 2019-08-14: qty 2

## 2019-08-14 MED ORDER — DICYCLOMINE HCL 10 MG/5ML PO SOLN
10.0000 mg | Freq: Once | ORAL | Status: AC
  Administered 2019-08-14: 10 mg via ORAL
  Filled 2019-08-14: qty 5

## 2019-08-14 MED ORDER — ONDANSETRON HCL 4 MG/2ML IJ SOLN
4.0000 mg | Freq: Four times a day (QID) | INTRAMUSCULAR | Status: DC | PRN
  Filled 2019-08-14: qty 2

## 2019-08-14 MED ORDER — LIDOCAINE VISCOUS HCL 2 % MT SOLN
15.0000 mL | Freq: Once | OROMUCOSAL | Status: AC
  Administered 2019-08-14: 15 mL via ORAL
  Filled 2019-08-14: qty 15

## 2019-08-14 MED ORDER — SODIUM CHLORIDE 0.9 % IV BOLUS
1000.0000 mL | Freq: Once | INTRAVENOUS | Status: AC
  Administered 2019-08-14: 1000 mL via INTRAVENOUS

## 2019-08-14 MED ORDER — ACETAMINOPHEN 650 MG RE SUPP: 650.0000 mg | Freq: Four times a day (QID) | RECTAL | Status: DC | PRN

## 2019-08-14 MED ORDER — ENOXAPARIN SODIUM 30 MG/0.3ML ~~LOC~~ SOLN
30.0000 mg | SUBCUTANEOUS | Status: DC
  Administered 2019-08-14: 30 mg via SUBCUTANEOUS
  Filled 2019-08-14: qty 0.3

## 2019-08-14 MED ORDER — LACTATED RINGERS IV BOLUS
1000.0000 mL | Freq: Once | INTRAVENOUS | Status: AC
  Administered 2019-08-14: 1000 mL via INTRAVENOUS

## 2019-08-14 MED ORDER — ALUM & MAG HYDROXIDE-SIMETH 200-200-20 MG/5ML PO SUSP
30.0000 mL | ORAL | Status: DC | PRN
  Administered 2019-08-14: 30 mL via ORAL
  Filled 2019-08-14: qty 30

## 2019-08-14 MED ORDER — CAPSAICIN 0.025 % EX CREA
TOPICAL_CREAM | Freq: Two times a day (BID) | CUTANEOUS | Status: DC
  Filled 2019-08-14: qty 60

## 2019-08-14 MED ORDER — ONDANSETRON 4 MG PO TBDP: 4.0000 mg | ORAL_TABLET | Freq: Once | ORAL | Status: DC

## 2019-08-14 MED ORDER — SODIUM CHLORIDE 0.9 % IV SOLN: INTRAVENOUS | Status: AC

## 2019-08-14 NOTE — Plan of Care (Signed)

## 2019-08-14 NOTE — H&P (Addendum)
[facility] Admission History and Physical Service Pager: [phone]  Patient name: Ryan Cruz Medical record number: [id number] Date of birth: Jun 26, 1965 Age: 54 y.o. Gender: male  Primary Care Provider: Charlott Rakes, MD Consultants: None  Code Status: DNR Preferred Emergency Contact: Wife  Chief Complaint: Abdominal pain, N/V/D  Assessment and Plan: Ryan Cruz is a 54 y.o. male presenting with nausea, vomiting and diarrhea since Wednesday (6/30) . PMH is significant for HIV on antivirals, HTN, marijuana and tobacco use.   Non-oliguric AKI in the setting of NVD, decreased PO intake Patient with multiple episodes of nausea, vomiting and diarrhea over the last week or so. Decreased PO intake. Pt also reports decreased urine output, however output improving since ED arrival.  Creatinine 2.92, baseline on 05/22/19 was 1.16. On exam, patient appears dehydrated with dry mucous membranes, mildly decreased capillary refill at 3 seconds.  Largely suspect 2/2 ongoing GI losses.  No associated hematuria, mild proteinuria.  Infrequent NSAID use. Obstruction and intrarenal causes less likely. No suprapubic tenderness. - Pt received 1L bolus NS and 1L bolus lactated ringers in the ED - NS at 138mL/hr - AM CMP, trend Cr -Control N/V/D as below  -Strict I and O's  -Hold home HCTZ, avoid nephrotoxic medications as possible  Nausea, vomiting, diarrhea Symptoms have been ongoing for at least the past week, resulting in substantial AKI as above. He notes that his stools are normally loose but have recently been more loose. Has tried Imodium and Pepto with only temporary relief. Denies blood in emesis or stools. No recent travel or sick contacts. No recent change in medications.  Leukocytosis of 16.1 and hemoglobin at baseline. Pt with mild epigastric, LUQ and LLQ pain on exam without rebounding or guarding. Differential includes cyclic vomiting syndrome, viral  gastroenteritis, PUD, pancreatitis, obstruction.  Given the chronic daily THC use with substantial improvement in symptoms during hot showers, do have some clinical concern for cyclic vomiting/cannabinoid hyperemesis syndrome.  While lipase is elevated at 156, doubt pancreatitis given duration of symptoms and atypical presentation.  PUD/GERD possibly contributing to overall clinical picture, however would not expect such significant N/V.  Covid negative.  Doubt acute gallbladder/liver pathology without RUQ/positive Murphy sign, fever, or abnormal liver function.  No concern for opportunistic infection given reliably well-controlled HIV for the past > 1 year. -Consulted GI, recommended obtaining a KUB to rule out obstructive pattern (returned negative) and placing n.p.o. at mn in case of possible EGD, to evaluate patient in the morning - AM CBC with diff- WBC on admission 16.1 - Zofran IV PRN -IV PPI daily -Capsaicin cream on abdomen -May give GI cocktail if no resolution with above -Measure weight  - Full liquid diet for now, n.p.o. at midnight - Acetaminophen 650mg  PRN pain or fever  GERD Patient reports feeling of "a hot burning worm" crawling up his chest. Has tried omeprazole with only temporary relief. No history of prior reflux. Worse after meals and laying down. Describes acid-like feeling in his throat. Pt has tried tums and omeprazole with some relief.  Suspect this is likely contributing to the above concerns.  - Received Maalox/Mylanta in ED - Protonix injection 40mg  qd  EKG changes Patient with new T-wave inversions in leads III, V5 and V6 today compared to previous EKG on 11/20/15. No chest pain, SOB today. Negative CXR in ED. HS Troponin 7 x2. Very low suspicion for ACS, chest pain described while in the ED likely GI related.   -  EKG in AM for comparison  -Consider stress testing/echocardiogram in the outpatient setting  Marijuana Use Pt daily smoker. Notes increased smoking to  3-4 sittings recently to help with his nausea.  - Capsaicin cream 0.025%  - Will encourage cessation - Urine rapid drug screen  Tobacco Use Pt notes 43 year smoking history. Currently smokes about 7-8 cigarettes a day. - Will offer nicotine patch   HTN Documented history of HTN. Home medications include amlodipine 10mg  qd and HCTZ 12.5 mg. - Continue home Norvasc, hold HCTZ in the setting of AKI - Continue to monitor BP  HIV On anti-virals.  RNA quant undetectable, follows with ID on a close basis.  Home medications include emtricitabine-rilpivir-tenofovir AF 200-25-25 MG - Continue home medications - CD4 count   FEN/GI: Full liquid diet Prophylaxis: Lovenox  Disposition: Admit to med surg, attending Dr. Andria Frames.   History of Present Illness:  Ryan Cruz is a 54 y.o. male presenting with nausea, vomiting and diarrhea.  Reports an approximate 3 week history of his constellation of symptoms. Initially started with cramp-y left lower back pain while sleeping for a few days and then started having trouble with "acid reflux." States he couldn't sleep laying flat because of feeling like "a hot burning worm was crawling up my chest." This went on for about one week, was at night only at first and then progressed to during the day. He has been taking tums and tried omeprazole with some relief. Emesis has been green/yellow, denies any hematemesis. Reports numerous episodes of vomiting/diarrhea everyday. No hematochezia. He states he hasn't eaten "really anything" due to feeling nauseous and vomiting. He has had some bananas, toast, and gatorade but vomits shortly after. Also having concurrent watery bowel movements, but states he often has loose stools. He been trying to drink cold water and that has made him nauseous. Lives with his son and wife. No one else is sick at home. THC use on a consistent basis (daily, usually 1-2 sittings but recently has done 3-4 sitting), has been helping with the  nausea. Tobacco use since age 59, 7-8 cigs daily. Occasional alcohol use. No recent travel. No fever.   Last night the abdominal pain became unbearable, present all over but mainly LUQ and epigastrium. Abdominal pain and nausea has been getting better with hot showers so he has been taking a lot of these. Comes on at the end of eating and with emesis. Reports decreased urine output and frequency. Also reports an approximately 15 lb weight loss since this onset. Noticing his face looking sunken in and rings falling off his fingers.   ED Course: Patient presented with complaints nausea, vomiting, and diarrhea. Received 1L of NS and 1L lactated ringers in the ED. IV Zofran and Maalox/Mylanta given in ED with some relief of nausea.   Review Of Systems: Per HPI with the following additions:   Review of Systems  Constitutional: Positive for appetite change, fatigue and unexpected weight change. Negative for chills and fever.  Respiratory: Negative for cough and shortness of breath.   Cardiovascular: Negative for leg swelling.  Gastrointestinal: Positive for abdominal pain, diarrhea, nausea and vomiting. Negative for abdominal distention, anal bleeding and blood in stool.  Genitourinary: Positive for decreased urine volume and dysuria. Negative for hematuria.  Musculoskeletal: Positive for back pain.     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    Other headache syndrome    Optic neuritis 04/16/2016   Chest pain 11/20/2015   Hypoglycemia 01/29/2014   Allergic sinusitis 06/29/2012   Pilonidal cyst 01/04/2012   Depression 06/25/2010   CONDYLOMA ACUMINATUM 01/07/2010   Essential hypertension 12/12/2009   Ventral hernia 03/28/2009    NEUROPATHY, NOS 04/30/2008   DENTAL CARIES 12/14/2007   TOBACCO USER 06/01/2007   VERTIGO 05/13/2007   Human immunodeficiency virus (HIV) disease ([place]) 02/24/2006   HYPERLIPIDEMIA 02/24/2006   Rectal bleeding 11/19/2005   ASTHMA 12/11/1994    Past Medical History: Past Medical History:  Diagnosis Date   Anxiety    Asthma    Depression    HIV (human immunodeficiency virus infection) ([place])    Hypertension    Hypoglycemia    sugars drop ion    Past Surgical History: Past Surgical History:  Procedure Laterality Date   COLONOSCOPY  02/12/2012   Procedure: COLONOSCOPY;  Surgeon: Leighton Ruff, MD;  Location: [facility];  Service: Endoscopy;  Laterality: N/A;   HERNIA REPAIR     umbilibcal    Social History: Social History   Tobacco Use   Smoking status: Current Every Day Smoker    Packs/day: 0.3    Years: 43.00    Pack years: 13    Types: Cigarettes   Smokeless tobacco: Never Used  Substance Use Topics   Alcohol use: Yes    Alcohol/week: 2.0 standard drinks    Types: 1 Glasses of wine, 1 Shots of liquor per week    Comment: occasional   Drug use: Yes    Frequency: 7.0 times per week    Types: Marijuana    Comment: marijuana daily    Please also refer to relevant sections of EMR.  Family History: Family History  Problem Relation Age of Onset   Diabetes Mother    Hypertension Mother    Heart disease Mother    Congestive Heart Failure Mother    Diabetes Father    Hypertension Father    Diabetes Sister    HIV Sister    Hypertension Sister    Diabetes Brother    Hypertension Brother    Diabetes Brother    Hypertension Brother    Diabetes Brother    Hypertension Brother    Diabetes Brother    Hypertension Brother    Allergies and Medications: Allergies  Allergen Reactions   Doxycycline Rash   No current facility-administered medications on file prior to encounter.   Current Outpatient Medications on File  Prior to Encounter  Medication Sig Dispense Refill   acetaminophen (TYLENOL) 325 MG tablet Take 650 mg by mouth every 6 (six) hours as needed.     amLODipine (NORVASC) 10 MG tablet Take 1 tablet by mouth daily. 30 tablet 3   Cyanocobalamin (VITAMIN B 12 PO) Take 1 tablet by mouth daily.     emtricitabine-rilpivir-tenofovir AF (ODEFSEY) 200-25-25 MG TABS tablet Take 1 tablet by mouth daily with breakfast. 30 tablet 5   hydrochlorothiazide (HYDRODIURIL) 12.5 MG tablet TAKE 1 TABLET(12.5 MG) BY MOUTH DAILY (Patient taking differently: Take 12.5 mg by mouth daily. ) 30 tablet 3   pseudoephedrine-acetaminophen (TYLENOL SINUS) 30-500 MG TABS tablet Take 1 tablet by mouth every 4 (four) hours as needed (sinus congestion).      Objective: BP 134/81    Pulse 72    Temp 98.2  F (36.8 C) (Oral)    Resp 12    SpO2 95%  Exam: General: Alert, awake, no acute distress, eating apple sauce Eyes: No scleral icterus  ENTM: Dry mucous membranes Neck: Supple Cardiovascular: RRR, no murmurs appreciated Respiratory: CTAB, no wheezing/rhonchi/rales, breathing comfortably on room air Gastrointestinal: Epigastric, LUQ and LLQ pain on deep palpation without any rebounding or guarding.  No masses or hepatosplenomegaly palpated.  Hypoactive BS, soft and nondistended.  Negative Murphy's sign and Rovsing's.  No tenderness at McBurney's point. MSK: Moving all extremities spontaneously  Derm: Fingernail clubbing noted in hands, no rashes seen Neuro: Alert and oriented.  Follows commands appropriately.  5/5 upper and lower extremity strength throughout.  Speech easily understandable.   Labs and Imaging: CBC BMET  Recent Labs  Lab 08/14/19 1118  WBC 16.1*  HGB 14.3  HCT 43.9  PLT 452*   Recent Labs  Lab 08/14/19 1118  NA 137  K 3.7  CL 106  CO2 14*  BUN 21*  CREATININE 2.92*  GLUCOSE 111*  CALCIUM 10.0     EKG: NSR at 96, T-wave inversions lead III, V5 and V6, right atrial enlargement. New  findings compared to EKG on 11/20/15  DG Chest 2 View  IMPRESSION: No active cardiopulmonary disease.  Sharion Settler, DO 08/14/2019, 5:15 PM PGY-1, [facility] pager: [phone], text pages welcome  FPTS Upper-Level Resident Addendum   I have independently interviewed and examined the patient. I have discussed the above with the original author and agree with their documentation. My edits for correction/addition/clarification are in green. Please see also any attending notes.    Patriciaann Clan, DO  Family Medicine PGY-3

## 2019-08-14 NOTE — Progress Notes (Signed)
Per pharmacy: "This patient is on odefsey and PPIs are contraindicated because the increase in gastric pH will cause a decrease in the rilpivirine concentration. H2RAs are still a major interaction for the same reason but not contraindicated. I would recommend switching to famotidine 10mg  every other day based on his renal function. Feel free to call me at [phone]"  - discontinued PPI

## 2019-08-14 NOTE — Hospital Course (Addendum)
Ryan Cruz is a 54 y.o. male presenting with nausea, vomiting and diarrhea since Wednesday (6/30) . PMH is significant for HIV on antivirals, HTN, marijuana and tobacco use. Below is his hospital course outlined by problem. Refer to the H&P for additional information.   Prerenal AKI in the setting of NVD, decreased PO intake Patient with multiple episodes of nausea, vomiting and diarrhea for over a week prior to admission. Creatinine in ED elevated at 2.92 compared to previous creatinine on 05/22/19 that was 1.16. KUB showed no signs of obstruction.Improved with fluids to 1.37 on day of discharge.    Nausea, vomiting, diarrhea 2/2 Giardia Several episodes for a week prior to arrival. Pt with elevated WBC in ED at 16.1. Improved to 10.1 by second day. Lipase elevated to 156 on admission with normal AST and ALT. CT Abd/pelvis significant for moderate mesenteric adenopathy. GI panel positive for Giardia. 500mg  PO Flagyl x5 days started on 7/7. EGD on 7/9 revealed grade B gastritis, a hiatal hernia and non-bleeding duodenal erosions.Patient progressed from full liquids to regular diet with no episodes of emesis or diarrhea prior to discharge   GERD Temporary relief with omeprazole prior to admission. No previous history of reflux. Received Maalox/Mylanta in ED. Protonix changed to famotidine due to drug interactions with PPI and HIV medications. Due to GI recommendation of 12 weeks PPI, patient's HIV medications were changed from [facility] to [facility]. He has follow up appointment with ID in August but will call for sooner date.  EKG changes Patient with new T-wave inversions in leads III, V5 and V6 today compared to previous EKG on 11/20/15. Negative CXR in ED. Troponin 7. Repeat EKG did not show any T-wave inversions. No chest pain or SOB while admitted. Outpatient cardiology follow up for potential echo/stress test recommended for follow up.    HTN Blood pressures stable throughout admission. 127/79 on  day of discharge. Home medication amlodipine 10mg  continued while admitted. HCTZ held and discontinued at discharge due to stable pressures throughout admission.   HIV CD4 count 513. Patient continued on home medications emtricitabine-rilpivir-tenofovir AF 200-25-25 MG  Follow Up: Outpatient Echo due to EKG changes Famotidine discontinued on discharge due to interaction with anti-retroviral. Consider other alternatives that don't have drug interaction HCTZ discontinued. BP should be re-checked on follow up. Repeat stool study should be considered if patient has persistent symptoms following completion of antibiotic.  GI has recommended a follow up CT in 6 months to monitor the mesenteric adenopathy  Follow up with GI in 8-12 weeks. Infectious disease in 6 weeks because HIV medications were altered. Odefsy discontinued, changed to [employer].

## 2019-08-14 NOTE — ED Triage Notes (Signed)
Pt reports 2 weeks of chest pain, emesis and diarrhea.

## 2019-08-14 NOTE — ED Notes (Signed)
Provider at bedside

## 2019-08-14 NOTE — ED Provider Notes (Signed)
[name]PART[facility] Provider Note   CSN: [id number] Arrival date & time: 08/14/19  1054     History Chief Complaint  Patient presents with  . Chest Pain  . Emesis  . Diarrhea    Ryan Cruz is a 54 y.o. male.  HPI  Patient is a 54 year old male with a history of HIV followed by [facility] last seen 4/26 found to have undetectable viral load and normal CD4.   Patient presents today with approximately 3 weeks/4 weeks of symptoms.  He states that he initially had some "acid reflux "which he describes as a burning pain in his stomach and then belching and brackish bad taste in his mouth.  He states he felt he could not sleep sleeping on his right side and had to sleep on his left side in order for this to be improved.  He states that he used Tums and omeprazole with only moderate relief.  He states that starting Wednesday he has had too numerous to count episodes of nonbloody nonbilious emesis.  He denies any recent antibiotic use and denies any fevers or blood in stools.  He states he has been having some diarrhea he states it has been numerous episodes per day.  He states he has been drinking Pedialyte and Gatorade.  He states that he feels that he is getting dehydrated.  He has no known sick contacts.  No known exposure to Covid.  No known exposure to hepatitis.  He denies any IV drug use but does endorse occasional marijuana use.  He states that he has noticed over the past couple days that he has been urinating less and has had darker urine.  He denies any hematuria, frequency urgency or suprapubic pain.  He states that he is feeling like he is not losing weight because of his symptoms.    Past Medical History:  Diagnosis Date  . Anxiety   . Asthma   . Depression   . HIV (human immunodeficiency virus infection) ([place])   . Hypertension   . Hypoglycemia    sugars drop ion    Patient Active Problem List   Diagnosis Date Noted  .  AKI (acute kidney injury) ([place]) 08/14/2019  . Foot lesion 06/05/2019  . Healthcare maintenance 04/26/2018  . Hepatitis B immune 11/18/2016  . Medically noncompliant   . 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 Other headache syndrome   . Optic neuritis 04/16/2016  . Chest pain 11/20/2015  . Hypoglycemia 01/29/2014  . Allergic sinusitis 06/29/2012  . Pilonidal cyst 01/04/2012  . Depression 06/25/2010  . 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 Essential hypertension 12/12/2009  . Ventral hernia 03/28/2009  . NEUROPATHY, NOS 04/30/2008  . DENTAL CARIES 12/14/2007  . TOBACCO USER 06/01/2007  . VERTIGO 05/13/2007  . Human immunodeficiency virus (HIV) disease ([place]) 02/24/2006  . HYPERLIPIDEMIA 02/24/2006  . Rectal bleeding 11/19/2005  . ASTHMA 12/11/1994    Past Surgical History:  Procedure Laterality Date  . COLONOSCOPY  02/12/2012   Procedure: COLONOSCOPY;  Surgeon: Leighton Ruff, MD;  Location: [facility] END[facility]COPY;  Service: Endoscopy;  Laterality: N/A;  . HERNIA REPAIR     umbilibcal       Family History  Problem Relation Age of Onset  . Diabetes Mother   . Hypertension Mother   . Heart disease Mother   . Congestive Heart Failure Mother   . Diabetes Father   . Hypertension Father   .  Diabetes Sister   . HIV Sister   . Hypertension Sister   . Diabetes Brother   . Hypertension Brother   . Diabetes Brother   . Hypertension Brother   . Diabetes Brother   . Hypertension Brother   . Diabetes Brother   . Hypertension Brother     Social History   Tobacco Use  . Smoking status: Current Every Day Smoker    Packs/day: 0.20    Years: 15.00    Pack years: 3.00    Types: Cigarettes  . Smokeless tobacco: Never Used  Substance Use Topics  . Alcohol use: Yes    Alcohol/week: 2.0 standard drinks    Types: 1 Glasses of wine, 1 Shots of liquor per week    Comment: occasional  . Drug use: Yes    Frequency: 7.0 times per week    Types:  Marijuana    Comment: marijuana daily    Home Medications Prior to Admission medications   Medication Sig Start Date End Date Taking? Authorizing Provider  acetaminophen (TYLENOL) 325 MG tablet Take 650 mg by mouth every 6 (six) hours as needed.   Yes [provider]  amLODipine (NORVASC) 10 MG tablet Take 1 tablet by mouth daily. 06/05/19  Yes Golden Circle, FNP  Cyanocobalamin (VITAMIN B 12 PO) Take 1 tablet by mouth daily.   Yes [provider]  emtricitabine-rilpivir-tenofovir AF (ODEFSEY) 200-25-25 MG TABS tablet Take 1 tablet by mouth daily with breakfast. 06/05/19  Yes Calone, Ples Specter, FNP  hydrochlorothiazide (HYDRODIURIL) 12.5 MG tablet TAKE 1 TABLET(12.5 MG) BY MOUTH DAILY Patient taking differently: Take 12.5 mg by mouth daily.  08/29/18  Yes Golden Circle, FNP  pseudoephedrine-acetaminophen (TYLENOL SINUS) 30-500 MG TABS tablet Take 1 tablet by mouth every 4 (four) hours as needed (sinus congestion).   Yes [provider]    Allergies    Doxycycline  Review of Systems   Review of Systems  Constitutional: Positive for appetite change, chills and fatigue. Negative for fever.  HENT: Negative for congestion.   Eyes: Negative for pain.  Respiratory: Negative for cough and shortness of breath.   Cardiovascular: Negative for chest pain and leg swelling.  Gastrointestinal: Positive for abdominal pain, diarrhea, nausea and vomiting.  Genitourinary: Positive for decreased urine volume. Negative for discharge, dysuria, flank pain, frequency and testicular pain.  Musculoskeletal: Negative for myalgias.  Skin: Negative for rash.  Neurological: Negative for dizziness and headaches.    Physical Exam Updated Vital Signs BP 128/89 (BP Location: Right Arm)   Pulse 97   Temp 98.2 F (36.8 C) (Oral)   Resp 12   SpO2 99%   Physical Exam Vitals and nursing note reviewed.  Constitutional:      General: He is not in acute distress. HENT:     Head:  Normocephalic and atraumatic.     Nose: Nose normal.     Mouth/Throat:     Mouth: Mucous membranes are dry.  Eyes:     General: No scleral icterus. Cardiovascular:     Rate and Rhythm: Normal rate and regular rhythm.     Pulses: Normal pulses.     Heart sounds: Normal heart sounds.  Pulmonary:     Effort: Pulmonary effort is normal. No respiratory distress.     Breath sounds: Normal breath sounds. No wheezing.     Comments: No increased work of breathing.  Lung sounds clear in all fields. Abdominal:     Palpations: Abdomen is soft. There is  no mass.     Tenderness: There is no guarding or rebound.     Comments: Abdomen is soft with mild tenderness left lower quadrant of the palpation same in the epigastrium.  No right upper quadrant right lower quadrant tenderness.  No guarding or rebound.  No hernia or masses.  CVA tenderness.  Musculoskeletal:     Cervical back: Normal range of motion.     Right lower leg: No edema.     Left lower leg: No edema.  Skin:    General: Skin is warm and dry.     Capillary Refill: Capillary refill takes less than 2 seconds.  Neurological:     Mental Status: He is alert. Mental status is at baseline.  Psychiatric:        Mood and Affect: Mood normal.        Behavior: Behavior normal.     ED Results / Procedures / Treatments   Labs (all labs ordered are listed, but only abnormal results are displayed) Labs Reviewed  LIPASE, BLOOD - Abnormal; Notable for the following components:      Result Value   Lipase 156 (*)    All other components within normal limits  COMPREHENSIVE METABOLIC PANEL - Abnormal; Notable for the following components:   CO2 14 (*)    Glucose, Bld 111 (*)    BUN 21 (*)    Creatinine, Ser 2.92 (*)    Total Protein 9.9 (*)    AST 13 (*)    GFR calc non Af Amer 24 (*)    GFR calc Af Amer 27 (*)    Anion gap 17 (*)    All other components within normal limits  CBC - Abnormal; Notable for the following components:   WBC 16.1  (*)    RBC 4.14 (*)    MCV 106.0 (*)    MCH 34.5 (*)    Platelets 452 (*)    All other components within normal limits  URINALYSIS, ROUTINE W REFLEX MICROSCOPIC - Abnormal; Notable for the following components:   Color, Urine AMBER (*)    APPearance HAZY (*)    Hgb urine dipstick SMALL (*)    Protein, ur 100 (*)    Bacteria, UA RARE (*)    All other components within normal limits  CBG MONITORING, ED - Abnormal; Notable for the following components:   Glucose-Capillary 127 (*)    All other components within normal limits  SARS CORONAVIRUS 2 BY RT PCR (HOSPITAL ORDER, [facility])  LACTIC ACID, PLASMA  RAPID URINE DRUG SCREEN, HOSP PERFORMED  TROPONIN I (HIGH SENSITIVITY)  TROPONIN I (HIGH SENSITIVITY)    EKG EKG Interpretation  Date/Time:  Monday August 14 2019 11:01:08 EDT Ventricular Rate:  96 PR Interval:  132 QRS Duration: 82 QT Interval:  326 QTC Calculation: 411 R Axis:   118 Text Interpretation: Normal sinus rhythm Right atrial enlargement Left posterior fascicular block T wave abnormality, consider inferolateral ischemia Abnormal ECG Confirmed by Pattricia Boss [phone]) on 08/14/2019 1:20:15 PM   Radiology DG Chest 2 View  Result Date: 08/14/2019 CLINICAL DATA:  Chest pain EXAM: CHEST - 2 VIEW COMPARISON:  February 28, 2018 FINDINGS: The heart size and mediastinal contours are within normal limits. Both lungs are clear. The visualized skeletal structures are unremarkable. IMPRESSION: No active cardiopulmonary disease. Electronically Signed   By: Abelardo Diesel M.D.   On: 08/14/2019 11:48    Procedures Procedures (including critical care time)  Medications  Ordered in ED Medications  emtricitabine-rilpivir-tenofovir AF (ODEFSEY) 200-25-25 MG per tablet 1 tablet (has no administration in time range)  amLODipine (NORVASC) tablet 10 mg (has no administration in time range)  enoxaparin (LOVENOX) injection 30 mg (has no administration in time range)    0.9 %  sodium chloride infusion (has no administration in time range)  capsaicin (ZOSTRIX) 0.025 % cream (has no administration in time range)  ondansetron (ZOFRAN) tablet 4 mg (has no administration in time range)    Or  ondansetron (ZOFRAN) injection 4 mg (has no administration in time range)  acetaminophen (TYLENOL) tablet 650 mg (has no administration in time range)    Or  acetaminophen (TYLENOL) suppository 650 mg (has no administration in time range)  pantoprazole (PROTONIX) injection 40 mg (has no administration in time range)  dicyclomine (BENTYL) 10 MG/5ML solution 10 mg (10 mg Oral Given 08/14/19 1454)  alum & mag hydroxide-simeth (MAALOX/MYLANTA) 200-200-20 MG/5ML suspension 30 mL (30 mLs Oral Given 08/14/19 1308)    And  lidocaine (XYLOCAINE) 2 % viscous mouth solution 15 mL (15 mLs Oral Given 08/14/19 1308)  sodium chloride 0.9 % bolus 1,000 mL (1,000 mLs Intravenous New Bag/Given 08/14/19 1306)  ondansetron (ZOFRAN) injection 4 mg (4 mg Intravenous Given 08/14/19 1300)  lactated ringers bolus 1,000 mL (1,000 mLs Intravenous New Bag/Given 08/14/19 1454)    ED Course  I have reviewed the triage vital signs and the nursing notes.  Pertinent labs & imaging results that were available during my care of the patient were reviewed by me and considered in my medical decision making (see chart for details).  Patient is 54 year old male presented today with 6 days of nausea vomiting and diarrhea.  He has been unable to tolerate p.o. and had severely decreased appetite.  He is also endorsing some chest pain however he states that this is burning and central and associated with vomiting.  Very atypical for ACS.  Doubt pulmonary embolism, ACS.  Some inferior T wave inversions he does not have a recent EKG for comparison that I can see.  Will trend troponins.  Physical exam is unremarkable.  He does have some left lower quadrant and epigastric tenderness with the palpation of this is very mild  however no guarding or rebound.  CBC with leukocytosis of 16, no anemia this may be secondary to his emesis.  His nausea vomiting have been well controlled with Zofran X1.  CMP notable for elevated BUN and creatinine with baseline creatinine 1 and new elevation today at 3 patient meets AKI criteria.  Will administer second liter of normal saline and reassess.  Lipase mildly elevated at 156 he has no history of pancreatitis has no right upper quadrant tenderness no history of cholelithiasis and no abnormalities in his LFTs with normal bilirubin.  Doubt gallstone pancreatitis or alcohol induced pancreatitis.    Covid test is negative.  Urinalysis with some abnormalities but no evidence of acute infection he has no changes on his troponin which is static at 7.  Clinical Course as of Aug 13 1637  Mon Aug 14, 2019  1507 Discussed with Dr. Higinio Plan of internal medicine who will admit patient for further eval and treatment.    [WF]    Clinical Course User Index [WF] Tedd Sias, PA   Reevaluated patient he has no abdominal pain on reexamination.  He has no tenderness. Patient is agreeable to admission for AKI and severe dehydration. BUN/creatinine ratio is potentially more intrarenal secondary to the chronicity of  his symptoms.  MDM Rules/Calculators/A&P                          Patient mated to hospital service for IV hydration, reevaluation of kidney function.  His emesis is improved with antiemetics and his chest pain resolved with GI cocktail and Bentyl and chest pain work-up was reassuring.   Final Clinical Impression(s) / ED Diagnoses Final diagnoses:  AKI (acute kidney injury) ([place])  Nausea vomiting and diarrhea    Rx / DC Orders ED Discharge Orders    None       Tedd Sias, [place] 08/14/19 1640    Pattricia Boss, MD 08/15/19 1320

## 2019-08-14 NOTE — ED Notes (Signed)
Pt tolerated fluids well no complaints noted at this time

## 2019-08-15 ENCOUNTER — Inpatient Hospital Stay (HOSPITAL_COMMUNITY): Payer: Self-pay

## 2019-08-15 DIAGNOSIS — R197 Diarrhea, unspecified: Secondary | ICD-10-CM

## 2019-08-15 DIAGNOSIS — R112 Nausea with vomiting, unspecified: Secondary | ICD-10-CM

## 2019-08-15 LAB — C DIFFICILE QUICK SCREEN W PCR REFLEX
C Diff antigen: NEGATIVE
C Diff interpretation: NOT DETECTED
C Diff toxin: NEGATIVE

## 2019-08-15 LAB — COMPREHENSIVE METABOLIC PANEL
ALT: 12 U/L (ref 0–44)
AST: 12 U/L — ABNORMAL LOW (ref 15–41)
Albumin: 4.1 g/dL (ref 3.5–5.0)
Alkaline Phosphatase: 64 U/L (ref 38–126)
Anion gap: 15 (ref 5–15)
BUN: 22 mg/dL — ABNORMAL HIGH (ref 6–20)
CO2: 13 mmol/L — ABNORMAL LOW (ref 22–32)
Calcium: 9.1 mg/dL (ref 8.9–10.3)
Chloride: 110 mmol/L (ref 98–111)
Creatinine, Ser: 2.18 mg/dL — ABNORMAL HIGH (ref 0.61–1.24)
GFR calc Af Amer: 39 mL/min — ABNORMAL LOW (ref >60)
GFR calc non Af Amer: 34 mL/min — ABNORMAL LOW (ref >60)
Glucose, Bld: 99 mg/dL (ref 70–99)
Potassium: 3.2 mmol/L — ABNORMAL LOW (ref 3.5–5.1)
Sodium: 138 mmol/L (ref 135–145)
Total Bilirubin: 0.6 mg/dL (ref 0.3–1.2)
Total Protein: 8.2 g/dL — ABNORMAL HIGH (ref 6.5–8.1)

## 2019-08-15 LAB — CBC WITH DIFFERENTIAL/PLATELET
Abs Immature Granulocytes: 0.03 10*3/uL (ref 0.00–0.07)
Basophils Absolute: 0.1 10*3/uL (ref 0.0–0.1)
Basophils Relative: 0 %
Eosinophils Absolute: 0.1 10*3/uL (ref 0.0–0.5)
Eosinophils Relative: 1 %
HCT: 39.7 % (ref 39.0–52.0)
Hemoglobin: 13.2 g/dL (ref 13.0–17.0)
Immature Granulocytes: 0 %
Lymphocytes Relative: 24 %
Lymphs Abs: 2.7 10*3/uL (ref 0.7–4.0)
MCH: 33.9 pg (ref 26.0–34.0)
MCHC: 33.2 g/dL (ref 30.0–36.0)
MCV: 102.1 fL — ABNORMAL HIGH (ref 80.0–100.0)
Monocytes Absolute: 0.8 10*3/uL (ref 0.1–1.0)
Monocytes Relative: 7 %
Neutro Abs: 7.7 10*3/uL (ref 1.7–7.7)
Neutrophils Relative %: 68 %
Platelets: 391 10*3/uL (ref 150–400)
RBC: 3.89 MIL/uL — ABNORMAL LOW (ref 4.22–5.81)
RDW: 12.6 % (ref 11.5–15.5)
WBC: 11.3 10*3/uL — ABNORMAL HIGH (ref 4.0–10.5)
nRBC: 0 % (ref 0.0–0.2)

## 2019-08-15 LAB — T-HELPER CELLS (CD4) COUNT (NOT AT ARMC)
CD4 % Helper T Cell: 19 % — ABNORMAL LOW (ref 33–65)
CD4 T Cell Abs: 513 /uL (ref 400–1790)

## 2019-08-15 MED ORDER — ADULT MULTIVITAMIN W/MINERALS CH
1.0000 | ORAL_TABLET | Freq: Every day | ORAL | Status: DC
  Administered 2019-08-17 – 2019-08-19 (×3): 1 via ORAL
  Filled 2019-08-15 (×3): qty 1

## 2019-08-15 MED ORDER — BOOST / RESOURCE BREEZE PO LIQD CUSTOM
1.0000 | Freq: Three times a day (TID) | ORAL | Status: DC
  Administered 2019-08-15 – 2019-08-16 (×2): 1 via ORAL

## 2019-08-15 MED ORDER — ENOXAPARIN SODIUM 40 MG/0.4ML ~~LOC~~ SOLN
40.0000 mg | SUBCUTANEOUS | Status: DC
  Filled 2019-08-15 (×2): qty 0.4

## 2019-08-15 MED ORDER — PANTOPRAZOLE SODIUM 40 MG IV SOLR
40.0000 mg | Freq: Two times a day (BID) | INTRAVENOUS | Status: DC
  Filled 2019-08-15: qty 40

## 2019-08-15 MED ORDER — FAMOTIDINE IN NACL 20-0.9 MG/50ML-% IV SOLN
20.0000 mg | Freq: Two times a day (BID) | INTRAVENOUS | Status: DC
  Filled 2019-08-15: qty 50

## 2019-08-15 MED ORDER — FAMOTIDINE IN NACL 20-0.9 MG/50ML-% IV SOLN
20.0000 mg | INTRAVENOUS | Status: DC
  Administered 2019-08-15: 20 mg via INTRAVENOUS
  Filled 2019-08-15 (×2): qty 50

## 2019-08-15 MED ORDER — POTASSIUM CHLORIDE CRYS ER 20 MEQ PO TBCR
40.0000 meq | EXTENDED_RELEASE_TABLET | Freq: Once | ORAL | Status: AC
  Administered 2019-08-15: 40 meq via ORAL
  Filled 2019-08-15: qty 2

## 2019-08-15 MED ORDER — IOHEXOL 9 MG/ML PO SOLN
500.0000 mL | ORAL | Status: AC
  Administered 2019-08-15 (×2): 500 mL via ORAL

## 2019-08-15 MED ORDER — LOPERAMIDE HCL 2 MG PO CAPS
2.0000 mg | ORAL_CAPSULE | Freq: Once | ORAL | Status: AC
  Administered 2019-08-15: 2 mg via ORAL
  Filled 2019-08-15: qty 1

## 2019-08-15 MED ORDER — POTASSIUM CHLORIDE 10 MEQ/100ML IV SOLN
10.0000 meq | INTRAVENOUS | Status: AC
  Administered 2019-08-15: 10 meq via INTRAVENOUS
  Filled 2019-08-15: qty 100

## 2019-08-15 NOTE — Progress Notes (Signed)
Patient refused IV potassium stating that its "burning" his arm. IV stopped and  MD notified.

## 2019-08-15 NOTE — Progress Notes (Signed)
Family Medicine Teaching Service Daily Progress Note Intern Pager: [phone]  Patient name: Ryan Cruz Medical record number: [id number] Date of birth: 14-Mar-1965 Age: 54 y.o. Gender: male  Primary Care Provider: Charlott Rakes, MD Consultants: GI Code Status: FULL  Pt Overview and Major Events to Date:  Admitted 7/5  Assessment and Plan: DRAVYN Cruz is a 54 y.o. male presenting with nausea, vomiting and diarrhea since Wednesday (6/30) . PMH is significant for HIV on antivirals, HTN, marijuana and tobacco use.   Non-oliguric AKI in the setting of NVD, decreased PO intake Creatinine now 2.18 from  2.92, baseline on 05/22/19 was 1.16. Potassium 3.2 from 3.7. IV Potassium ordered but patient was unable to tolerate due to shoulder pain. States he has had similar sxs in past with potassium. - Pt received 1L bolus NS and 1L bolus lactated ringers in the ED - NS at 16mL/hr - Consider PO Potassium supplementation pending GI procedure  - Strict I and O's - Holding HCTZ still, BP 135/82 this AM  - Avoid nephrotoxic medications  Nausea, vomiting, diarrhea Today patient feels better. One episode of vomiting. 4 episodes of loose stools yesterday with no blood. On exam, has some epigastric and LUQ tenderness. Pancreatitis still on the differential. Will treat symptomatically and consider repeat Lipase tomorrow. Possible EGD today. Leukocytosis down to 11.3 from 16.1  - Negative KUB  - AM CBC  - Zofran IV PRN - Per pharmacy recommendations, PPI discontinued due to interaction with rilpivirine. Recommended switch to famotidine 10mg  every other day based on renal function. This still has some drug interaction, will do short-term only. -Capsaicin cream on abdomen - Maalox/Mylanta q4h - Weight this AM 62.6kg (138.01 lb) from 66.2 (145.94 lb) last night. This was over an approximately 6 hour span, could be an error. Will recheck this. Patient states weight fluctuates between 135-150 - Currently  NPO for possible EGD today - Acetaminophen 650mg  PRN pain or fever  GERD PPI stopped due to interaction with rilpivirine. Recommended switch to Famotidine. Still has drug interactions but will do for short term.  - Received Maalox/Mylanta in ED - 20mg  IV pepcid qd  EKG changes Patient with new T-wave inversions in leads III, V5 and V6 today compared to previous EKG on 11/20/15. No chest pain, SOB today. Negative CXR in ED. HS Troponin 7 x2. Very low suspicion for ACS, chest pain described while in the ED likely GI related.   - EKG this AM without T-wave inversions - Consider stress testing/echocardiogram in the outpatient setting  Marijuana Use Pt daily smoker. Notes increased smoking to 3-4 sittings recently to help with his nausea.  - Capsaicin cream 0.025%  - Will encourage cessation - Awaiting urine drug screen   Tobacco Use Pt notes 43 year smoking history. Currently smokes about 7-8 cigarettes a day. - Declines nicotine patch. States he has had no cravings and thinks he will quit all together.  HTN BP this AM 135/82. Documented history of HTN. Home medications include amlodipine 10mg  qd and HCTZ 12.5 mg. - Continue home Norvasc, hold HCTZ in the setting of AKI - Continue to monitor BP  HIV On anti-virals.  RNA quant undetectable, follows with ID on a close basis.  Home medications include emtricitabine-rilpivir-tenofovir AF 200-25-25 MG - Continue home medications - CD4 count: 513   FEN/GI: NPO Prophylaxis: Lovenox  Disposition: Inpatient  Subjective:  Patient tells me that he feels better today. He states he was able to lay down yesterday with no reflux  symptoms. Continues to have several loose bowel movements, was up every two hours from midnight to 8am. Only one episode of vomiting. No bloody stools or emesis. States that he is hungry and would like food. Was not aware that he may have possible EGD today. Patient also notes that he has had no cravings for  cigarettes since he's been here and thinks he may quit all together.   Objective: Temp:  [98.2 F (36.8 C)-99 F (37.2 C)] 98.3 F (36.8 C) (07/06 0352) Pulse Rate:  [62-97] 64 (07/06 0352) Resp:  [12-18] 17 (07/06 0352) BP: (125-148)/(76-92) 135/82 (07/06 0352) SpO2:  [95 %-100 %] 100 % (07/06 0352) Weight:  [62.6 kg-66.2 kg] 62.6 kg (07/06 0352) Physical Exam: General: Awake, alert, pleasant, no acute distress Cardiovascular: RRR, no murmurs Respiratory: CTAB, no increased work of breathing Abdomen: Epigastric and LUQ ttp, no rebound or guarding, normoactive BS, soft, no distension Extremities: 2+DP and radial pulses   Laboratory: Recent Labs  Lab 08/14/19 1118 08/15/19 0449  WBC 16.1* 11.3*  HGB 14.3 13.2  HCT 43.9 39.7  PLT 452* 391   Recent Labs  Lab 08/14/19 1118 08/15/19 0449  NA 137 138  K 3.7 3.2*  CL 106 110  CO2 14* 13*  BUN 21* 22*  CREATININE 2.92* 2.18*  CALCIUM 10.0 9.1  PROT 9.9* 8.2*  BILITOT 0.9 0.6  ALKPHOS 75 64  ALT 14 12  AST 13* 12*  GLUCOSE 111* 99     Imaging/Diagnostic Tests: DG Abd 1 View 7/5 IMPRESSION: Negative.  DG Chest 2 View 7/5  IMPRESSION: No active cardiopulmonary disease.  Sharion Settler, DO 08/15/2019, 6:18 AM PGY-1, [place] Intern pager: [phone], text pages welcome

## 2019-08-15 NOTE — Consult Note (Addendum)
Consultation  Referring Provider:   Dr. Andria Frames Primary Care Physician:  Charlott Rakes, MD Primary Gastroenterologist: Althia Forts        Reason for Consultation: Diarrhea, nausea and vomiting              HPI:   Ryan Cruz is a 54 y.o. male with a past medical history as listed below including HIV, who presented to the ER on 08/14/2019 with nausea, vomiting and diarrhea.    Today, the patient describes a 3-week history of symptoms starting with left lower back pain which was cramping, then reflux.  This progressed regardless of Tums and Omeprazole.  He then started having vomiting which was green/yellow and reports numerous episodes of diarrhea every day every hour to hour and a half.  Explains that he would only vomit when he was having diarrhea.  Constantly nauseous.  Tells me that the nausea and vomiting is some better since being here but he is continued with a loose watery stool.  Describes not really "eating anything" due to feeling nauseous and vomiting.  Associated symptoms include some epigastric pain.    Social history positive for THC use on a consistent basis (daily), tells me he grew up in [place] there is a field of marijuana right in between the tobacco just across the street, in fact he started smoking more when he was having cramping.  ED course: Presented with nausea, vomiting and diarrhea  GI history: 02/12/2012 colonoscopy, Dr Leighton Ruff: Hemorrhoids, otherwise normal  Past Medical History:  Diagnosis Date  . Anxiety   . Asthma   . Depression   . HIV (human immunodeficiency virus infection) ([place])   . Hypertension   . Hypoglycemia    sugars drop ion    Past Surgical History:  Procedure Laterality Date  . COLONOSCOPY  02/12/2012   Procedure: COLONOSCOPY;  Surgeon: Leighton Ruff, MD;  Location: [facility];  Service: Endoscopy;  Laterality: N/A;  . HERNIA REPAIR     umbilibcal    Family History  Problem Relation Age of Onset  . Diabetes Mother   .  Hypertension Mother   . Heart disease Mother   . Congestive Heart Failure Mother   . Diabetes Father   . Hypertension Father   . Diabetes Sister   . HIV Sister   . Hypertension Sister   . Diabetes Brother   . Hypertension Brother   . Diabetes Brother   . Hypertension Brother   . Diabetes Brother   . Hypertension Brother   . Diabetes Brother   . Hypertension Brother     Social History   Tobacco Use  . Smoking status: Current Every Day Smoker    Packs/day: 0.20    Years: 15.00    Pack years: 3.00    Types: Cigarettes  . Smokeless tobacco: Never Used  Substance Use Topics  . Alcohol use: Yes    Alcohol/week: 2.0 standard drinks    Types: 1 Glasses of wine, 1 Shots of liquor per week    Comment: occasional  . Drug use: Yes    Frequency: 7.0 times per week    Types: Marijuana    Comment: marijuana daily    Prior to Admission medications   Medication Sig Start Date End Date Taking? Authorizing Provider  acetaminophen (TYLENOL) 325 MG tablet Take 650 mg by mouth every 6 (six) hours as needed.   Yes [provider]  amLODipine (NORVASC) 10 MG tablet Take 1 tablet by mouth daily.  06/05/19  Yes Golden Circle, FNP  Cyanocobalamin (VITAMIN B 12 PO) Take 1 tablet by mouth daily.   Yes [provider]  emtricitabine-rilpivir-tenofovir AF (ODEFSEY) 200-25-25 MG TABS tablet Take 1 tablet by mouth daily with breakfast. 06/05/19  Yes Calone, Ples Specter, FNP  hydrochlorothiazide (HYDRODIURIL) 12.5 MG tablet TAKE 1 TABLET(12.5 MG) BY MOUTH DAILY Patient taking differently: Take 12.5 mg by mouth daily.  08/29/18  Yes Golden Circle, FNP  pseudoephedrine-acetaminophen (TYLENOL SINUS) 30-500 MG TABS tablet Take 1 tablet by mouth every 4 (four) hours as needed (sinus congestion).   Yes [provider]    Current Facility-Administered Medications  Medication Dose Route Frequency Provider Last Rate Last Admin  . 0.9 %  sodium chloride infusion   Intravenous  Continuous Darrelyn Hillock N, DO 150 mL/hr at 08/15/19 0730 New Bag at 08/15/19 0730  . acetaminophen (TYLENOL) tablet 650 mg  650 mg Oral Q6H PRN Patriciaann Clan, DO   650 mg at 08/14/19 1829   Or  . acetaminophen (TYLENOL) suppository 650 mg  650 mg Rectal Q6H PRN Patriciaann Clan, DO      . alum & mag hydroxide-simeth (MAALOX/MYLANTA) 200-200-20 MG/5ML suspension 30 mL  30 mL Oral Q4H PRN Zenia Resides, MD   30 mL at 08/14/19 1829  . amLODipine (NORVASC) tablet 10 mg  10 mg Oral Daily Beard, Samantha N, DO      . capsaicin (ZOSTRIX) 0.025 % cream   Topical BID Patriciaann Clan, DO   Given at 08/14/19 2221  . emtricitabine-rilpivir-tenofovir AF (ODEFSEY) 200-25-25 MG per tablet 1 tablet  1 tablet Oral Q breakfast Beard, Samantha N, DO      . enoxaparin (LOVENOX) injection 30 mg  30 mg Subcutaneous Q24H Beard, Samantha N, DO   30 mg at 08/14/19 2047  . ondansetron (ZOFRAN) tablet 4 mg  4 mg Oral Q6H PRN Darrelyn Hillock N, DO       Or  . ondansetron (ZOFRAN) injection 4 mg  4 mg Intravenous Q6H PRN Higinio Plan, Samantha N, DO      . potassium chloride 10 mEq in 100 mL IVPB  10 mEq Intravenous Q1 Hr x 4 Espinoza, Alejandra, DO 100 mL/hr at 08/15/19 0707 10 mEq at 08/15/19 0707    Allergies as of 08/14/2019 - Review Complete 08/14/2019  Allergen Reaction Noted  . Doxycycline Rash 09/15/2018     Review of Systems:    Constitutional: No weight loss, fever or chills Skin: No rash or itching Cardiovascular: No chest pain  Respiratory: No SOB  Gastrointestinal: See HPI and otherwise negative Genitourinary: No dysuria or change in urinary frequency Neurological: No headache, dizziness or syncope Musculoskeletal: No new muscle or joint pain Hematologic: No bleeding Psychiatric: No history of depression or anxiety    Physical Exam:  Vital signs in last 24 hours: Temp:  [98.2 F (36.8 C)-99 F (37.2 C)] 98.3 F (36.8 C) (07/06 0352) Pulse Rate:  [62-97] 64 (07/06 0352) Resp:  [12-18]  17 (07/06 0352) BP: (125-148)/(76-92) 135/82 (07/06 0352) SpO2:  [95 %-100 %] 100 % (07/06 0352) Weight:  [62.6 kg-66.2 kg] 62.6 kg (07/06 0352) Last BM Date: 08/14/19 General:   Pleasant AA male appears to be in NAD, Well developed, Well nourished, alert and cooperative Head:  Normocephalic and atraumatic. Eyes:   PEERL, EOMI. No icterus. Conjunctiva pink. Ears:  Normal auditory acuity. Neck:  Supple Throat: Oral cavity and pharynx without inflammation, swelling or lesion. Teeth in good condition.  Lungs: Respirations even and unlabored. Lungs clear to auscultation bilaterally.   No wheezes, crackles, or rhonchi.  Heart: Normal S1, S2. No MRG. Regular rate and rhythm. No peripheral edema, cyanosis or pallor.  Abdomen:  Soft, nondistended, mild epigastric ttp. No rebound or guarding. Increased BS all four quadrants. No appreciable masses or hepatomegaly. Rectal:  Not performed.  Msk:  Symmetrical without gross deformities. Peripheral pulses intact.  Extremities:  Without edema, no deformity or joint abnormality.  Neurologic:  Alert and  oriented x4;  grossly normal neurologically.  Skin:   Dry and intact without significant lesions or rashes. Psychiatric: Demonstrates good judgement and reason without abnormal affect or behaviors.   LAB RESULTS: Recent Labs    08/14/19 1118 08/15/19 0449  WBC 16.1* 11.3*  HGB 14.3 13.2  HCT 43.9 39.7  PLT 452* 391   BMET Recent Labs    08/14/19 1118 08/15/19 0449  NA 137 138  K 3.7 3.2*  CL 106 110  CO2 14* 13*  GLUCOSE 111* 99  BUN 21* 22*  CREATININE 2.92* 2.18*  CALCIUM 10.0 9.1   LFT Recent Labs    08/15/19 0449  PROT 8.2*  ALBUMIN 4.1  AST 12*  ALT 12  ALKPHOS 64  BILITOT 0.6   STUDIES: DG Chest 2 View  Result Date: 08/14/2019 CLINICAL DATA:  Chest pain EXAM: CHEST - 2 VIEW COMPARISON:  February 28, 2018 FINDINGS: The heart size and mediastinal contours are within normal limits. Both lungs are clear. The visualized  skeletal structures are unremarkable. IMPRESSION: No active cardiopulmonary disease. Electronically Signed   By: Abelardo Diesel M.D.   On: 08/14/2019 11:48   DG Abd 1 View  Result Date: 08/14/2019 CLINICAL DATA:  Intractable vomiting with nausea. EXAM: ABDOMEN - 1 VIEW COMPARISON:  None. FINDINGS: The bowel gas pattern is normal. No radio-opaque calculi or other significant radiographic abnormality are seen. IMPRESSION: Negative. Electronically Signed   By: Virgina Norfolk M.D.   On: 08/14/2019 19:31    Impression / Plan:   Impression: 1.  Nausea, vomiting: For at least a week resulting in AKI, leukocytosis of 16.1, mild epigastric pain, lipase elevated at 156, some improvement with hot showers, KUB negative; consider hyperemesis from cannabis versus viral versus pancreatitis versus other 2.  Diarrhea: With above at least 6-8 times a day; consider infectious cause versus IBS versus other 3.  AKI: Slowly improving 4.  GERD: Over the past 3 weeks; consider relation to marijuana use versus other 5.  Marijuana use: Multiple times a day since the patient was 54 years old 6.  HIV: On antivirals 7.  Elevated lipase  Plan: 1. Can not use PPI due to HIV medication. Started Pepcid 20 IV BID 2.  Discussed marijuana cessation with the patient there is a high probability that this is increasing his nausea and vomiting, he says this will not be hard for him. 3.  Patient does have elevated lipase, there is question of pancreatitis from hospitalist team.  Would recommend a CT abdomen/ pelvis with contrast for further evaluation.  Ordered this. 4.  Continue anti-emetics 5.  We will see what CT shows, but patient may benefit from an EGD while here if he continues with symptoms and he does not have pancreatitis. 6.  Ordered stool studies to include GI pathogen panel and C. difficile 7.  Placed the patient on a clear liquid diet for now, n.p.o. after midnight in case of EGD tomorrow 8.  Please await any further  recommendations  from Dr. Lyndel Safe later today.  Thank you for your kind consultation, we will continue to follow.  Lavone Nian [facility]  08/15/2019, 9:52 AM     Attending physician's note   I have taken an interval history, reviewed the chart and examined the patient. I agree with the Advanced Practitioner's note, impression and recommendations.   54yr old with H/O HIV with N/V/epi pain/diarrhea x 3-4 weeks. Has AKI, elevated lipase and H/O marijuana abuse. Neg colon 02/2012.  Plan: -IV pepcid (can't use PPIs d/t odefsey) -CT AP -Stool studies. -Supportive treatment -Stop marijuana. -Stop Maalox. -EGD in the next 2-3 days. Colon only if continued LGI symptoms. -Will follow along.   Carmell Austria, MD Velora Heckler Fabienne Bruns [phone].

## 2019-08-15 NOTE — Progress Notes (Signed)
Initial Nutrition Assessment  DOCUMENTATION CODES:   Not applicable  INTERVENTION:   -MVI with minerals daily -Boost Breeze po TID, each supplement provides 250 kcal and 9 grams of protein -RD will follow for diet advancement and adjust supplement regimen as appropriate  NUTRITION DIAGNOSIS:   Inadequate oral intake related to altered GI function as evidenced by per patient/family report.  GOAL:   Patient will meet greater than or equal to 90% of their needs  MONITOR:   PO intake, Supplement acceptance, Diet advancement, Labs, Weight trends, Skin, I & O's  REASON FOR ASSESSMENT:   Malnutrition Screening Tool    ASSESSMENT:   Ryan Cruz is a 54 y.o. male presenting with nausea, vomiting and diarrhea since Wednesday (6/30) . PMH is significant for HIV on antivirals, HTN, marijuana and tobacco use.  Pt admitted with AKI.  Reviewed I/O's: -460 ml x 24 hours  UOP: 300 ml x 24 hours  Attempted to examine pt x 2. On first attempt, pt was receiving nursing care at time of visit. Also attempted to speak with pt via phone, however, no answer.   Per chart review, pt has experienced a 3 week hisotry of vomiting and excessive diarrhea. Intake has been very poor secondary to nausea and vomiting. Per GI notes, pt is a daily cannabis user and use has increased since symptoms have started. Plan for CT and possible EGD tomorrow.  Reviewed wt hx; pt has experienced a 8.6% wt loss over the past 3 months, which is significant for time frame. Highly suspect pt with malnutrition, however, unable to assess at this time. Pt would greatly benefit from addition of oral nutrition supplements.  Medications reviewed and include 0.9% sodium chloride infusion @ 150 ml/hr.  Labs reviewed: K: 3.2.   Diet Order:   Diet Order            Diet NPO time specified  Diet effective midnight           Diet clear liquid Room service appropriate? Yes; Fluid consistency: Thin  Diet effective now                  EDUCATION NEEDS:   No education needs have been identified at this time  Skin:  Skin Assessment: Reviewed RN Assessment  Last BM:  08/14/19  Height:   Ht Readings from Last 1 Encounters:  08/14/19 5\' 7"  (1.702 m)    Weight:   Wt Readings from Last 1 Encounters:  08/15/19 62.6 kg    Ideal Body Weight:  67.3 kg  BMI:  Body mass index is 21.62 kg/m.  Estimated Nutritional Needs:   Kcal:  2100-2300  Protein:  110-125 grams  Fluid:  > 2.1 L    Loistine Chance, RD, LDN, [place] Registered Dietitian II Certified Diabetes Care and Education Specialist Please refer to [facility] for RD and/or RD on-call/weekend/after hours pager

## 2019-08-15 NOTE — Progress Notes (Addendum)
Family Medicine Teaching Service Daily Progress Note Intern Pager: [phone]  Patient name: Ryan Cruz Medical record number: [id number] Date of birth: 1965-02-27 Age: 54 y.o. Gender: male  Primary Care Provider: Charlott Rakes, MD Consultants: GI Code Status: FULL  Pt Overview and Major Events to Date:  Admitted 7/5  Assessment and Plan: Ryan Cruz a 54 y.o.malepresenting with nausea, vomiting and diarrhea since Wednesday (6/30). PMH is significant for HIV on antivirals,HTN, marijuana and tobacco use.  Non-oliguricAKI in the setting of NVD, decreased PO intake Creatinine now 1.79 from 2.18,baseline on 05/22/19 was 1.16. Potassium 3.6 from 3.2. - NS at 144mL/hr - Strict I and O's - Holding HCTZ, BP 133/80 this AM  - Avoid nephrotoxic medications  Nausea, vomiting, diarrhea Today patient feels "like my stomach is on fire".  On exam, has central and LLQ tenderness to palpation. CT findings notable for moderate mesenteric adenopathy suggestive of possible infectious or inflammatory etiology. WBC 10.1 from 11.3.  - Acetaminophen 650mg  PRN pain or fever - ZofranIVPRN nausea - IV famotidine 20mg  at 147mL/hr qd. This still has some drug interaction with antiretroviral but will do short-term only. -Capsaicin cream on abdomen - Weight today 57kg (125.7 lbs)  from 62.6kg (138.01 lb) yesterday. Believe this is inaccurate.  - Full liquid diet - Per nutrition: Feeding supplement TID between meals and multivitamin added due to malnutrition 2/2 vomitng - GI recommend colonoscopy if GI panel negative with persistent symptoms.  GERD Continued symptoms suggestive of reflux. Will continue plan per GI. - PO famotidine 20 mg currently; plan to stop on discharge due to drug interaction with HIV medication.  EKG changes Patient with some new T-wave inversions in leads III, V5 and V6 on admission compared to previous EKG on 11/20/15. Repeat EKG did not show T-wave inversions.  -  Consider stress testing/echocardiogram in the outpatient setting  Marijuana Use Pt daily smoker. Notes increased smoking to 3-4 sittings recently to help with his nausea.  - Capsaicin cream 0.025%  - Patient feels as though he would be able to stop smoking  Tobacco Use Pt notes 43 year smoking history. Currently smokes about 7-8 cigarettes a day. - Declines nicotine patch. States he has had no cravings and thinks he will quit all together.  HTN BP this AM 133/80. Documented history of HTN. Home medications include amlodipine 10mg  qdand HCTZ 12.5 mg. - Continuehome Norvasc, hold HCTZ in the setting of AKI - Continue to monitor BP  HIV On anti-virals.RNA quant undetectable,follows with ID on a close basis. Home medications include emtricitabine-rilpivir-tenofovir AF 200-25-25 MG - Continue home medications - CD4 count: 513   FEN/GI:Clear liquid diet Prophylaxis:Lovenox  Disposition: Likely discharge tomorrow  Subjective:  Patient tells me today that his stomach feels "on fire". He also describes the pain "hunger pain" and adds that this is new as he has not had an appetite in a long time. Pt reports an episode of dizziness and weakness that started around 3AM.  He continues to have bowel movements approximately every 2 hours, "apple-sauce" consistency. No hematochezia. He states that he is overall "feeling much better".    Objective: Temp:  [98.2 F (36.8 C)-98.9 F (37.2 C)] 98.9 F (37.2 C) (07/06 2033) Pulse Rate:  [62-77] 77 (07/06 2033) Resp:  [17] 17 (07/06 1032) BP: (133-144)/(82-92) 133/89 (07/06 2033) SpO2:  [99 %-100 %] 99 % (07/06 2033) Weight:  [62.6 kg] 62.6 kg (07/06 0352) Physical Exam: General: Awake, alert, no distress Cardiovascular: RRR, no murmurs Respiratory: CTAB,  no wheezing/rhonchi/rales Abdomen: Central and LLQ tenderness to palpation, hyperactive BS, no rebound or guarding Extremities: No edema   Laboratory: Recent Labs  Lab  08/14/19 1118 08/15/19 0449  WBC 16.1* 11.3*  HGB 14.3 13.2  HCT 43.9 39.7  PLT 452* 391   Recent Labs  Lab 08/14/19 1118 08/15/19 0449  NA 137 138  K 3.7 3.2*  CL 106 110  CO2 14* 13*  BUN 21* 22*  CREATININE 2.92* 2.18*  CALCIUM 10.0 9.1  PROT 9.9* 8.2*  BILITOT 0.9 0.6  ALKPHOS 75 64  ALT 14 12  AST 13* 12*  GLUCOSE 111* 99    Imaging/Diagnostic Tests: CT Abd/Pelv 7/6 IMPRESSION: Moderate mesenteric adenopathy, possibly infectious or inflammatory in the acute setting. Unremarkable noncontrast examination of the pancreas.  DG Abd 1 View 7/5 IMPRESSION: Negative.  DG Chest 2 View 7/5  IMPRESSION: No active cardiopulmonary disease.  Sharion Settler, DO 08/15/2019, 9:38 PM PGY-1, [place] Intern pager: [phone], text pages welcome

## 2019-08-16 DIAGNOSIS — Z21 Asymptomatic human immunodeficiency virus [HIV] infection status: Secondary | ICD-10-CM

## 2019-08-16 LAB — CBC
HCT: 40 % (ref 39.0–52.0)
Hemoglobin: 12.8 g/dL — ABNORMAL LOW (ref 13.0–17.0)
MCH: 32.5 pg (ref 26.0–34.0)
MCHC: 32 g/dL (ref 30.0–36.0)
MCV: 101.5 fL — ABNORMAL HIGH (ref 80.0–100.0)
Platelets: 371 10*3/uL (ref 150–400)
RBC: 3.94 MIL/uL — ABNORMAL LOW (ref 4.22–5.81)
RDW: 12.4 % (ref 11.5–15.5)
WBC: 10.1 10*3/uL (ref 4.0–10.5)
nRBC: 0 % (ref 0.0–0.2)

## 2019-08-16 LAB — COMPREHENSIVE METABOLIC PANEL
ALT: 10 U/L (ref 0–44)
AST: 14 U/L — ABNORMAL LOW (ref 15–41)
Albumin: 4 g/dL (ref 3.5–5.0)
Alkaline Phosphatase: 69 U/L (ref 38–126)
Anion gap: 12 (ref 5–15)
BUN: 19 mg/dL (ref 6–20)
CO2: 11 mmol/L — ABNORMAL LOW (ref 22–32)
Calcium: 9.1 mg/dL (ref 8.9–10.3)
Chloride: 111 mmol/L (ref 98–111)
Creatinine, Ser: 1.79 mg/dL — ABNORMAL HIGH (ref 0.61–1.24)
GFR calc Af Amer: 49 mL/min — ABNORMAL LOW (ref >60)
GFR calc non Af Amer: 43 mL/min — ABNORMAL LOW (ref >60)
Glucose, Bld: 94 mg/dL (ref 70–99)
Potassium: 3.6 mmol/L (ref 3.5–5.1)
Sodium: 134 mmol/L — ABNORMAL LOW (ref 135–145)
Total Bilirubin: 0.7 mg/dL (ref 0.3–1.2)
Total Protein: 8.2 g/dL — ABNORMAL HIGH (ref 6.5–8.1)

## 2019-08-16 LAB — GASTROINTESTINAL PANEL BY PCR, STOOL (REPLACES STOOL CULTURE)

## 2019-08-16 MED ORDER — FAMOTIDINE 20 MG PO TABS
20.0000 mg | ORAL_TABLET | Freq: Every day | ORAL | Status: DC
  Administered 2019-08-16 – 2019-08-18 (×3): 20 mg via ORAL
  Filled 2019-08-16 (×3): qty 1

## 2019-08-16 MED ORDER — METRONIDAZOLE 500 MG PO TABS
500.0000 mg | ORAL_TABLET | Freq: Two times a day (BID) | ORAL | Status: DC
  Administered 2019-08-16 – 2019-08-19 (×6): 500 mg via ORAL
  Filled 2019-08-16 (×6): qty 1

## 2019-08-16 NOTE — Plan of Care (Signed)

## 2019-08-16 NOTE — Progress Notes (Addendum)
[facility] Progress Note  CC:  N/V/D  Subjective: He continues to pass "green apple sauce" type stools every 1 1/2 hours during the day and night. No bloody stools. He had mild LUQ burning discomfort reduced after Capsasin topical applied to area. No significant abdominal pain. He has urinated x 3 this morning, urine is clear yellow. He has nausea at night time. No vomiting. No other complaints.    Objective:  Vital signs in last 24 hours: Temp:  [98.2 F (36.8 C)-98.9 F (37.2 C)] 98.4 F (36.9 C) (07/07 0816) Pulse Rate:  [64-77] 73 (07/07 0816) Resp:  [14-17] 16 (07/07 0816) BP: (133-149)/(80-89) 149/81 (07/07 0816) SpO2:  [99 %-100 %] 100 % (07/07 0816) Last BM Date: 08/15/19 General:   Alert 54 year old male in NAD Heart: RRR, no murmur.  Pulm:  Breath sounds clear throughout.  Abdomen: Flat abdomen, soft, nontender, + BS x 4 quads. No HSM. Extremities:  Without edema. Neurologic:  Alert and  oriented x4;  grossly normal neurologically. Psych:  Alert and cooperative. Normal mood and affect.  Intake/Output from previous day: 07/06 0701 - 07/07 0700 In: 240 [P.O.:240] Out: 1200 [Urine:800; Stool:400] Intake/Output this shift: No intake/output data recorded.  Lab Results: Recent Labs    08/14/19 1118 08/15/19 0449 08/16/19 0414  WBC 16.1* 11.3* 10.1  HGB 14.3 13.2 12.8*  HCT 43.9 39.7 40.0  PLT 452* 391 371   BMET Recent Labs    08/14/19 1118 08/15/19 0449 08/16/19 0414  NA 137 138 134*  K 3.7 3.2* 3.6  CL 106 110 111  CO2 14* 13* 11*  GLUCOSE 111* 99 94  BUN 21* 22* 19  CREATININE 2.92* 2.18* 1.79*  CALCIUM 10.0 9.1 9.1   LFT Recent Labs    08/16/19 0414  PROT 8.2*  ALBUMIN 4.0  AST 14*  ALT 10  ALKPHOS 69  BILITOT 0.7   PT/INR No results for input(s): LABPROT, INR in the last 72 hours. Hepatitis Panel No results for input(s): HEPBSAG, HCVAB, HEPAIGM, HEPBIGM in the last 72 hours.  CT ABDOMEN PELVIS WO  CONTRAST  Result Date: 08/15/2019 CLINICAL DATA:  Nausea, vomiting, abdominal pain, elevated serum lipase EXAM: CT ABDOMEN AND PELVIS WITHOUT CONTRAST TECHNIQUE: Multidetector CT imaging of the abdomen and pelvis was performed following the standard protocol without IV contrast. COMPARISON:  None. FINDINGS: Lower chest: The visualized lung bases are clear bilaterally. The visualized heart and pericardium are unremarkable Hepatobiliary: Liver and gallbladder are unremarkable Pancreas: The pancreas is unremarkable on this noncontrast examination. Spleen: Unremarkable Adrenals/Urinary Tract: Unremarkable Stomach/Bowel: The stomach is unremarkable. The large and small bowel are unremarkable. Appendix normal. No free intraperitoneal gas or fluid. Vascular/Lymphatic: Moderate aortoiliac atherosclerotic calcification without evidence of aneurysm. There is there is prominent mesenteric adenopathy identified with the index lymph node measuring 1.1 x 2.2 cm at axial image number sign 47. This is nonspecific and may be reactive as can be seen with infectious enterocolitis, inflammatory, as can be seen with mesenteric adenitis, or lymphoproliferative in nature. No additional pathologic adenopathy within the abdomen and pelvis. Reproductive: Unremarkable Other: None significant Musculoskeletal: Unilateral left L5 pars defect is identified. No lytic or blastic bone lesions are seen. IMPRESSION: Moderate mesenteric adenopathy, possibly infectious or inflammatory in the acute setting. Unremarkable noncontrast examination of the pancreas. Electronically Signed   By: Fidela Salisbury MD   On: 08/15/2019 16:38   DG Chest 2 View  Result Date: 08/14/2019 CLINICAL DATA:  Chest pain EXAM:  CHEST - 2 VIEW COMPARISON:  February 28, 2018 FINDINGS: The heart size and mediastinal contours are within normal limits. Both lungs are clear. The visualized skeletal structures are unremarkable. IMPRESSION: No active cardiopulmonary disease.  Electronically Signed   By: Abelardo Diesel M.D.   On: 08/14/2019 11:48   DG Abd 1 View  Result Date: 08/14/2019 CLINICAL DATA:  Intractable vomiting with nausea. EXAM: ABDOMEN - 1 VIEW COMPARISON:  None. FINDINGS: The bowel gas pattern is normal. No radio-opaque calculi or other significant radiographic abnormality are seen. IMPRESSION: Negative. Electronically Signed   By: Virgina Norfolk M.D.   On: 08/14/2019 19:31    Assessment / Plan: 47. 54 year old male  HIV + admitted to the hospital with N/V and diarrhea. C. Diff antigen and toxin negative. GI pathogen panel pending. CTAP wo contrast 7/6 showed moderated mesenteric adenopathy.  -Await GI pathogen panel -Discussed scheduling a diagnostic colonoscopy if GI pathogen negative and symptoms persist -Full liquid diet, push fluids  2. Epigastric pain. Lipase 156 on 7/5. CTAP wo contrast showed a normal pancreas. -Eventual EGD -Continue Famotidine 20mg  IV Q 24 hours  3. Leukocytosis, resolved. WBC 10.1. He remains afebrile   4. GERD. Cannot take PPI which is contraindicated with ODEFSEY for HIV  5. AKI improving. Cr 2.18 -> 1.79  6. Macrocytic anemia. Hg 12.8. MCV 101.5.  -CBC, B12, folate and iron panel in am   Further recommendations per Dr. Lyndel Safe    Active Problems:   AKI (acute kidney injury) ([place])   Intractable vomiting with nausea   Nausea vomiting and diarrhea     LOS: 1 day   Noralyn Pick  08/16/2019, 9:55 AM     Attending physician's note   I have taken an interval history, reviewed the chart and examined the patient. I agree with the Advanced Practitioner's note, impression and recommendations.   54yr old HIV+ Persistent diarrhea. Vomiting has resolved.  Still has some mild epigastric pain. CT AP - mod mesenteric adenopathy. H/O marijuana abuse.  Neg colon 02/2012.  Plan: -Await GI pathogen panel. -If neg and continued s/s, would need EGD/colonoscopy. -Continue famotidine. -Recommend ID  consultation as well.  Carmell Austria, MD Velora Heckler Fabienne Bruns [phone].

## 2019-08-16 NOTE — Discharge Summary (Signed)
[facility] Discharge Summary  Patient name: Ryan Cruz Medical record number: [id number] Date of birth: [birth year] Age: 54 y.o. Gender: male Date of Admission: 08/14/2019  Date of Discharge: 08/19/19 Admitting Physician: Zenia Resides, MD  Primary Care Provider: Charlott Rakes, MD Consultants: GI  Indication for Hospitalization: AKI, NVD 2/2 Giardia  Discharge Diagnoses/Problem List:  Nausea, vomiting, diarrhea Non-oliguric AKI  GERD EKG changes  Disposition: Home  Discharge Condition: Stable  Discharge Exam:  Temp:  [97.5 F (36.4 C)-98.8 F (37.1 C)] 98.4 F (36.9 C) (07/10 0833) Pulse Rate:  [64-87] 67 (07/10 0833) Resp:  [15-20] 16 (07/10 0833) BP: (108-135)/(61-82) 127/79 (07/10 0833) SpO2:  [94 %-100 %] 100 % (07/10 0833) Weight:  [58.8 kg] 58.8 kg (07/10 0500) Physical Exam: General: Awake, alert, eating breakfast  Cardiovascular: RRR, no murmurs Respiratory: CTAB Abdomen: Soft, mild tenderness LUQ, no distension or guarding Extremities: no edema  Brief Hospital Course:  Ryan Cruz is a 54 y.o. male presenting with nausea, vomiting and diarrhea since Wednesday (6/30) . PMH is significant for HIV on antivirals, HTN, marijuana and tobacco use. Below is his hospital course outlined by problem. Refer to the H&P for additional information.   Prerenal AKI in the setting of NVD, decreased PO intake Patient with multiple episodes of nausea, vomiting and diarrhea for over a week prior to admission. Creatinine in ED elevated at 2.92 compared to previous creatinine on 05/22/19 that was 1.16. KUB showed no signs of obstruction.Improved with fluids to 1.37 on day of discharge.    Nausea, vomiting, diarrhea 2/2 Giardia Several episodes for a week prior to arrival. Pt with elevated WBC in ED at 16.1. Improved to 10.1 by second day. Lipase elevated to 156 on admission with normal AST and ALT. CT Abd/pelvis significant for moderate mesenteric  adenopathy. GI panel positive for Giardia. 500mg  PO Flagyl x5 days started on 7/7. EGD on 7/9 revealed grade B gastritis, a hiatal hernia and non-bleeding duodenal erosions.Patient progressed from full liquids to regular diet with no episodes of emesis or diarrhea prior to discharge   GERD Temporary relief with omeprazole prior to admission. No previous history of reflux. Received Maalox/Mylanta in ED. Protonix changed to famotidine due to drug interactions with PPI and HIV medications. Due to GI recommendation of 12 weeks PPI, patient's HIV medications were changed from [facility] to [place]. He has follow up appointment with ID in August but will call for sooner date.  EKG changes Patient with new T-wave inversions in leads III, V5 and V6 today compared to previous EKG on 11/20/15. Negative CXR in ED. Troponin 7. Repeat EKG did not show any T-wave inversions. No chest pain or SOB while admitted. Outpatient cardiology follow up for potential echo/stress test recommended for follow up.    HTN Blood pressures stable throughout admission. 127/79 on day of discharge. Home medication amlodipine 10mg  continued while admitted. HCTZ held and discontinued at discharge due to stable pressures throughout admission.   HIV CD4 count 513. Patient continued on home medications emtricitabine-rilpivir-tenofovir AF 200-25-25 MG  Home medications were continued for other chronic conditions that remained stable.  Issues for Follow Up:  1. Outpatient Echo due to EKG changes 2. HCTZ discontinued. BP should be re-checked on follow up. 3. Repeat stool study should be considered if patient has persistent symptoms following completion of antibiotic.  4. GI has recommended a follow up CT in 6 months to monitor the mesenteric adenopathy  5. Follow up with GI in  8-12 weeks. 6. Infectious disease in 6 weeks because HIV medications were altered. Odefsy discontinued, changed to [facility] due to PPI interactions.   Significant  Procedures:  EGD 7/9 - LA Grade B reflux esophagitis with no bleeding. Biopsied. - 3 cm hiatal hernia. - Normal stomach. Biopsied. - Duodenal erosions without bleeding. Biopsied  Significant Labs and Imaging:  Recent Labs  Lab 08/16/19 0414 08/17/19 0412 08/18/19 0654  WBC 10.1 10.2 9.4  HGB 12.8* 14.9 14.1  HCT 40.0 45.2 42.7  PLT 371 434* 428*   Recent Labs  Lab 08/14/19 1118 08/14/19 1118 08/15/19 0449 08/15/19 0449 08/16/19 0414 08/16/19 0414 08/17/19 0412 08/17/19 0412 08/18/19 0654 08/19/19 0650  NA 137   < > 138  --  134*  --  135  --  133* 135  K 3.7   < > 3.2*   < > 3.6   < > 3.8   < > 3.7 3.1*  CL 106   < > 110  --  111  --  110  --  109 112*  CO2 14*   < > 13*  --  11*  --  13*  --  12* 16*  GLUCOSE 111*   < > 99  --  94  --  123*  --  98 94  BUN 21*   < > 22*  --  19  --  16  --  20 16  CREATININE 2.92*   < > 2.18*  --  1.79*  --  1.69*  --  1.68* 1.37*  CALCIUM 10.0   < > 9.1  --  9.1  --  10.2  --  9.6 8.2*  ALKPHOS 75  --  64  --  69  --   --   --   --   --   AST 13*  --  12*  --  14*  --   --   --   --   --   ALT 14  --  12  --  10  --   --   --   --   --   ALBUMIN 4.9  --  4.1  --  4.0  --   --   --   --   --    < > = values in this interval not displayed.     CT Abd/Pelv 7/6 IMPRESSION: Moderate mesenteric adenopathy, possibly infectious or inflammatory in the acute setting. Unremarkable noncontrast examination of the pancreas.  DG Abd 1 View 7/5 IMPRESSION: Negative.  DG Chest 2 View 7/5 IMPRESSION: No active cardiopulmonary disease.  Results/Tests Pending at Time of Discharge: HIV-1 RNA, PCR  Discharge Medications:  Allergies as of 08/19/2019      Reactions   Doxycycline Rash      Medication List    STOP taking these medications   hydrochlorothiazide 12.5 MG tablet Commonly known as: HYDRODIURIL   Odefsey 200-25-25 MG Tabs tablet Generic drug: emtricitabine-rilpivir-tenofovir AF     TAKE these medications    acetaminophen 325 MG tablet Commonly known as: TYLENOL Take 650 mg by mouth every 6 (six) hours as needed.   amLODipine 10 MG tablet Commonly known as: NORVASC Take 1 tablet by mouth daily.   bictegravir-emtricitabine-tenofovir AF 50-200-25 MG Tabs tablet Commonly known as: BIKTARVY Take 1 tablet by mouth daily.   metroNIDAZOLE 500 MG tablet Commonly known as: FLAGYL Take 1 tablet (500 mg total) by mouth every 12 (twelve) hours for 7 doses.  omeprazole 20 MG capsule Commonly known as: PriLOSEC Take 1 capsule (20 mg total) by mouth daily.   ondansetron 4 MG tablet Commonly known as: ZOFRAN Take 1 tablet (4 mg total) by mouth every 6 (six) hours as needed for nausea.   pseudoephedrine-acetaminophen 30-500 MG Tabs tablet Commonly known as: TYLENOL SINUS Take 1 tablet by mouth every 4 (four) hours as needed (sinus congestion).   VITAMIN B 12 PO Take 1 tablet by mouth daily.       Discharge Instructions: Please refer to Patient Instructions section of EMR for full details.  Patient was counseled important signs and symptoms that should prompt return to medical care, changes in medications, dietary instructions, activity restrictions, and follow up appointments.   Follow-Up Appointments:  Follow-up Information    Charlott Rakes, MD. Schedule an appointment as soon as possible for a visit.   Specialty: Family Medicine Why: For hospital follow-up.  You will need repeat labs within the next week. Contact information: [place] [address] [phone]        Campbell Riches, MD. Go on 09/26/2019.   Specialty: Infectious Diseases Why: Or sooner if needed. Contact information: [place] [address] [phone]        [facility]. Schedule an appointment as soon as possible for a visit.   Specialty: Gastroenterology Why: Follow-up in their clinic in the next 8-12 weeks. Contact information: [place] [address] [place], [place], DO 08/19/2019, 11:11 AM PGY-1, [place]

## 2019-08-16 NOTE — Progress Notes (Signed)
Family Medicine Teaching Service Daily Progress Note Intern Pager: [phone]  Patient name: Ryan Cruz Medical record number: [id number] Date of birth: 19-Oct-1965 Age: 54 y.o. Gender: male  Primary Care Provider: Charlott Rakes, MD Consultants: GI Code Status: FULL  Pt Overview and Major Events to Date:  Admitted 7/5  Assessment and Plan: Ryan E Scottis a 54 y.o.malepresenting with nausea, vomiting and diarrhea since Wednesday (6/30). PMH is significant for HIV on antivirals,HTN, marijuana and tobacco use.  Nausea, vomiting, diarrhea States he has diffuse abdominal pain. Two episodes of vomiting this morning 3AM, but no bowel movements today. On exam, has epigastric, LUQ and LLQ tenderness.  Leukocytosis has resolved. C. Diff negative. GI panel positive for Giardia. Patient started on Flagyl 500mg  BID x5 days, first dose yesterday 7/7. - ZofranIVPRN nausea  - 20mg  PO famotidine qd - Capsaicin cream on abdomen - GI cocktail - Weight this AM 57.9kg (127.65 lb), improved from from 57 kg (125.7 lb) yesterday.  - Per nutrition: Feeding supplement TID between meals and multivitamin added due to malnutrition 2/2 vomitng - Acetaminophen 650mg  PRN pain or fever  GERD PPI stopped due to interaction with rilpivirine. Recommended switch to Famotidine. Still has drug interactions but will do for short term.  - 20mg  PO famotidine qd  Non-oliguricAKI in the setting of NVD, decreased PO intake Creatinine now 1.69 from 1.79,baseline on 05/22/19 was 1.16. - NS at 112mL/hr  - Strict I and O's - Holding HCTZ  - Avoid nephrotoxic medications  EKG changes Patient with new T-wave inversions in leads III, V5 and V6 today compared to previous EKG on 11/20/15. No chest pain, SOB today. Negative CXR in ED.HSTroponin 7x2. Repeat EKG without T-wave inversions - Consider stress testing/echocardiogram in the outpatient setting  Marijuana Use Pt daily smoker. Notes increased smoking to  3-4 sittings recently to help with his nausea.  - Capsaicin cream 0.025%  - Will encourage cessation - Awaiting urine drug screen   Tobacco Use Pt notes 43 year smoking history. Currently smokes about 7-8 cigarettes a day. - Declines nicotine patch. States he has had no cravings and thinks he will quit all together.  HTN BP this AM 145/95. Documented history of HTN. Home medications include amlodipine 10mg  qdand HCTZ 12.5 mg. - Continuehome Norvasc, hold HCTZ. - Continue to monitor BP  HIV- stable On anti-virals.RNA quant undetectable,follows with ID on a close basis. Home medications include emtricitabine-rilpivir-tenofovir AF 200-25-25 MG - Continue home medications - CD4 count: 513   FEN/GI: Full liquids  PPx: Lovenox  Disposition: Likely discharge home tomorrow.  Subjective:  Patient tells me that he has no bowel movements today. He did have two episodes of vomiting early this morning. He continues to feel diffuse abdominal soreness and describes a burning pain that was relieved with Pepcid. He also has a right sided temporal band-like headache that he rates as a 7/10. He feels intermittently dizzy.    Objective: Temp:  [98.3 F (36.8 C)-98.9 F (37.2 C)] 98.6 F (37 C) (07/07 1413) Pulse Rate:  [64-77] 76 (07/07 1413) Resp:  [14-16] 16 (07/07 1413) BP: (132-149)/(74-89) 132/74 (07/07 1413) SpO2:  [99 %-100 %] 100 % (07/07 1413) Weight:  [57 kg] 57 kg (07/07 1413) Physical Exam: General: Awake, alert, no acute distress, thin HEENT: no tenderness to palpation over right temporalis  Cardiovascular: RRR, no mumurs Respiratory: CTAB, no increased work of breathing Abdomen: +epigastric, LUQ and LLQ tenderness, normoactive BS, no rebound or guarding Extremities: No edema, 2+DP and radial  pulses  Laboratory: Recent Labs  Lab 08/14/19 1118 08/15/19 0449 08/16/19 0414  WBC 16.1* 11.3* 10.1  HGB 14.3 13.2 12.8*  HCT 43.9 39.7 40.0  PLT 452* 391 371   Recent  Labs  Lab 08/14/19 1118 08/15/19 0449 08/16/19 0414  NA 137 138 134*  K 3.7 3.2* 3.6  CL 106 110 111  CO2 14* 13* 11*  BUN 21* 22* 19  CREATININE 2.92* 2.18* 1.79*  CALCIUM 10.0 9.1 9.1  PROT 9.9* 8.2* 8.2*  BILITOT 0.9 0.6 0.7  ALKPHOS 75 64 69  ALT 14 12 10   AST 13* 12* 14*  GLUCOSE 111* 99 94    Imaging/Diagnostic Tests: CT Abd/Pelv 7/6 IMPRESSION: Moderate mesenteric adenopathy, possibly infectious or inflammatory in the acute setting. Unremarkable noncontrast examination of the pancreas.  DG Abd 1 View 7/5 IMPRESSION: Negative.  DG Chest 2 View 7/5 IMPRESSION: No active cardiopulmonary disease.  Ryan Settler, DO 08/16/2019, 4:55 PM PGY-1, [place] Intern pager: [phone], text pages welcome

## 2019-08-17 LAB — CBC
HCT: 45.2 % (ref 39.0–52.0)
Hemoglobin: 14.9 g/dL (ref 13.0–17.0)
MCH: 33.4 pg (ref 26.0–34.0)
MCHC: 33 g/dL (ref 30.0–36.0)
MCV: 101.3 fL — ABNORMAL HIGH (ref 80.0–100.0)
Platelets: 434 10*3/uL — ABNORMAL HIGH (ref 150–400)
RBC: 4.46 MIL/uL (ref 4.22–5.81)
RDW: 12.3 % (ref 11.5–15.5)
WBC: 10.2 10*3/uL (ref 4.0–10.5)
nRBC: 0 % (ref 0.0–0.2)

## 2019-08-17 LAB — IRON AND TIBC
Iron: 62 ug/dL (ref 45–182)
Saturation Ratios: 16 % — ABNORMAL LOW (ref 17.9–39.5)
TIBC: 377 ug/dL (ref 250–450)
UIBC: 315 ug/dL

## 2019-08-17 LAB — BASIC METABOLIC PANEL
Anion gap: 12 (ref 5–15)
BUN: 16 mg/dL (ref 6–20)
CO2: 13 mmol/L — ABNORMAL LOW (ref 22–32)
Calcium: 10.2 mg/dL (ref 8.9–10.3)
Chloride: 110 mmol/L (ref 98–111)
Creatinine, Ser: 1.69 mg/dL — ABNORMAL HIGH (ref 0.61–1.24)
GFR calc Af Amer: 53 mL/min — ABNORMAL LOW (ref >60)
GFR calc non Af Amer: 46 mL/min — ABNORMAL LOW (ref >60)
Glucose, Bld: 123 mg/dL — ABNORMAL HIGH (ref 70–99)
Potassium: 3.8 mmol/L (ref 3.5–5.1)
Sodium: 135 mmol/L (ref 135–145)

## 2019-08-17 LAB — VITAMIN B12: Vitamin B-12: 456 pg/mL (ref 180–914)

## 2019-08-17 LAB — FERRITIN: Ferritin: 232 ng/mL (ref 24–336)

## 2019-08-17 LAB — FOLATE: Folate: 7 ng/mL (ref >5.9)

## 2019-08-17 MED ORDER — ALUM & MAG HYDROXIDE-SIMETH 200-200-20 MG/5ML PO SUSP
30.0000 mL | Freq: Once | ORAL | Status: AC
  Administered 2019-08-17: 30 mL via ORAL
  Filled 2019-08-17: qty 30

## 2019-08-17 MED ORDER — ENOXAPARIN SODIUM 40 MG/0.4ML ~~LOC~~ SOLN
40.0000 mg | SUBCUTANEOUS | Status: DC
  Filled 2019-08-17 (×2): qty 0.4

## 2019-08-17 MED ORDER — LIDOCAINE VISCOUS HCL 2 % MT SOLN
15.0000 mL | Freq: Once | OROMUCOSAL | Status: DC
  Filled 2019-08-17: qty 15

## 2019-08-17 MED ORDER — ENSURE ENLIVE PO LIQD
237.0000 mL | Freq: Two times a day (BID) | ORAL | Status: DC
  Administered 2019-08-17 – 2019-08-19 (×3): 237 mL via ORAL

## 2019-08-17 MED ORDER — LIDOCAINE VISCOUS HCL 2 % MT SOLN
15.0000 mL | Freq: Once | OROMUCOSAL | Status: AC
  Administered 2019-08-17: 15 mL via ORAL
  Filled 2019-08-17: qty 15

## 2019-08-17 NOTE — Progress Notes (Signed)
Nutrition Follow-up  DOCUMENTATION CODES:   Not applicable  INTERVENTION:   -D/c Boost Breeze po TID, each supplement provides 250 kcal and 9 grams of protein -Ensure Enlive po BID, each supplement provides 350 kcal and 20 grams of protein -Magic cup TID with meals, each supplement provides 290 kcal and 9 grams of protein -Continue MVI with minerals daily  NUTRITION DIAGNOSIS:   Inadequate oral intake related to altered GI function as evidenced by per patient/family report.  Ongoing  GOAL:   Patient will meet greater than or equal to 90% of their needs  Progressing   MONITOR:   PO intake, Supplement acceptance, Diet advancement, Labs, Weight trends, Skin, I & O's  REASON FOR ASSESSMENT:   Malnutrition Screening Tool    ASSESSMENT:   Ryan Cruz is a 54 y.o. male presenting with nausea, vomiting and diarrhea since Wednesday (6/30) . PMH is significant for HIV on antivirals, HTN, marijuana and tobacco use.  7/7- advanced to full liquids  Reviewed I/O's: +240 ml x 24 hours and -720 ml since admission  Attempted to speak with pt via hospital room phone, however, no answer.  Per GI notes, awaiting GI pathogen panel. If GI pathogen negative and symptoms persist, plan for colonoscopy.   Per MD notes, pt with no BM today, but vomited twice this morning. He consumed 0% yesterday. He is refusing Boost Breeze supplements.   Labs reviewed.    Diet Order:   Diet Order            Diet full liquid Room service appropriate? Yes; Fluid consistency: Thin  Diet effective now                 EDUCATION NEEDS:   No education needs have been identified at this time  Skin:  Skin Assessment: Reviewed RN Assessment  Last BM:  08/17/19  Height:   Ht Readings from Last 1 Encounters:  08/14/19 5\' 7"  (1.702 m)    Weight:   Wt Readings from Last 1 Encounters:  08/17/19 57.9 kg    Ideal Body Weight:  67.3 kg  BMI:  Body mass index is 19.99 kg/m.  Estimated  Nutritional Needs:   Kcal:  2100-2300  Protein:  110-125 grams  Fluid:  > 2.1 L    Loistine Chance, RD, LDN, [place] Registered Dietitian II Certified Diabetes Care and Education Specialist Please refer to [facility] for RD and/or RD on-call/weekend/after hours pager

## 2019-08-17 NOTE — Progress Notes (Signed)
Progress Note    ASSESSMENT AND PLAN:   1. N/V with epi pain.  Neg CT for any etiology.  H/O significant cannabis use. 2. GERD on Pepcid 3. Diarrhea d/t giardiasis.  On Flagyl. Resolved. Neg colon 02/2012 4. HIV being followed by Dr. Johnnye Sima 5. Mesenteric lymphadenopathy on CT  Plan: -EGD in AM. -No need for colonoscopy at this time. -Continue Pepcid for now. -Continue Zofran as needed. -Small but more frequent meals -Repeat CT in 6 months to assess lymphadenopathy. -He has follow-up appointment with Dr. Johnnye Sima in coming weeks.    SUBJECTIVE  Stool studies positive for Giardia.  He has been started on Flagyl. Diarrhea has completely resolved. Had 2 episodes of vomiting early this morning.  Had significant associated heartburn, regurgitation.  No odynophagia or dysphagia.  OBJECTIVE:     Vital signs in last 24 hours: Temp:  [98.2 F (36.8 C)-98.6 F (37 C)] 98.4 F (36.9 C) (07/08 1341) Pulse Rate:  [71-105] 79 (07/08 1341) Resp:  [17-18] 17 (07/08 1341) BP: (118-142)/(81-95) 118/84 (07/08 1341) SpO2:  [99 %-100 %] 100 % (07/08 1341) Weight:  [57.9 kg] 57.9 kg (07/08 0633) Last BM Date: 08/17/19 General:   Alert, cachectic male Abdomen:  Soft, nondistended, nontender.  Normal bowel sounds,.       Neurologic:  Alert and  oriented x4;  grossly normal neurologically. Psych:  Pleasant, cooperative.  Normal mood and affect.   Intake/Output from previous day: 07/07 0701 - 07/08 0700 In: 240 [P.O.:240] Out: -  Intake/Output this shift: Total I/O In: 120 [P.O.:120] Out: -   Lab Results: Recent Labs    08/15/19 0449 08/16/19 0414 08/17/19 0412  WBC 11.3* 10.1 10.2  HGB 13.2 12.8* 14.9  HCT 39.7 40.0 45.2  PLT 391 371 434*   BMET Recent Labs    08/15/19 0449 08/16/19 0414 08/17/19 0412  NA 138 134* 135  K 3.2* 3.6 3.8  CL 110 111 110  CO2 13* 11* 13*  GLUCOSE 99 94 123*  BUN 22* 19 16  CREATININE 2.18* 1.79* 1.69*  CALCIUM 9.1 9.1 10.2    LFT Recent Labs    08/16/19 0414  PROT 8.2*  ALBUMIN 4.0  AST 14*  ALT 10  ALKPHOS 69  BILITOT 0.7   PT/INR No results for input(s): LABPROT, INR in the last 72 hours. Hepatitis Panel No results for input(s): HEPBSAG, HCVAB, HEPAIGM, HEPBIGM in the last 72 hours.  CT ABDOMEN PELVIS WO CONTRAST  Result Date: 08/15/2019 CLINICAL DATA:  Nausea, vomiting, abdominal pain, elevated serum lipase EXAM: CT ABDOMEN AND PELVIS WITHOUT CONTRAST TECHNIQUE: Multidetector CT imaging of the abdomen and pelvis was performed following the standard protocol without IV contrast. COMPARISON:  None. FINDINGS: Lower chest: The visualized lung bases are clear bilaterally. The visualized heart and pericardium are unremarkable Hepatobiliary: Liver and gallbladder are unremarkable Pancreas: The pancreas is unremarkable on this noncontrast examination. Spleen: Unremarkable Adrenals/Urinary Tract: Unremarkable Stomach/Bowel: The stomach is unremarkable. The large and small bowel are unremarkable. Appendix normal. No free intraperitoneal gas or fluid. Vascular/Lymphatic: Moderate aortoiliac atherosclerotic calcification without evidence of aneurysm. There is there is prominent mesenteric adenopathy identified with the index lymph node measuring 1.1 x 2.2 cm at axial image number sign 47. This is nonspecific and may be reactive as can be seen with infectious enterocolitis, inflammatory, as can be seen with mesenteric adenitis, or lymphoproliferative in nature. No additional pathologic adenopathy within the abdomen and pelvis. Reproductive: Unremarkable Other: None significant Musculoskeletal: Unilateral  left L5 pars defect is identified. No lytic or blastic bone lesions are seen. IMPRESSION: Moderate mesenteric adenopathy, possibly infectious or inflammatory in the acute setting. Unremarkable noncontrast examination of the pancreas. Electronically Signed   By: Fidela Salisbury MD   On: 08/15/2019 16:38     Active  Problems:   AKI (acute kidney injury) ([place])   Intractable vomiting with nausea   Nausea vomiting and diarrhea     LOS: 2 days     Carmell Austria, MD 08/17/2019, 2:46 PM [place] [facility] [phone]

## 2019-08-17 NOTE — Progress Notes (Signed)
Family Medicine Teaching Service Daily Progress Note Intern Pager: [phone]  Patient name: MARCQUIS Cruz Medical record number: [id number] Date of birth: Feb 24, 1965 Age: 54 y.o. Gender: male  Primary Care Provider: Charlott Rakes, MD Consultants: GI Code Status: FULL  Pt Overview and Major Events to Date:  Admitted 7/5 EGD 7/9  Assessment and Plan: [name] a 54 y.o.malepresenting with nausea, vomiting and diarrhea since 6/30. PMH is significant for HIV on antivirals,HTN, marijuana and tobacco use.  Nausea, vomiting, diarrhea 2/2 Giardia EGD planned for today. Today patient overall feels much improved.  On exam, he is only mildly tender to LLQ. Patient continued on Flagyl 500mg  BID x5 days, 7/7-7/12. - ZofranIVPRN nausea  - 20mg  PO famotidine qd - Per nutrition: Ensure feeding supplement BID, magic cup TID with meals and multivitamin added due to malnutrition 2/2 vomitng - Acetaminophen 650mg  PRN pain or fever  GERD - improving PPI stopped due to interaction with rilpivirine.Recommended switch to Famotidine. Still has drug interactions but will do for short term. -20mg  PO famotidine qd  Non-oliguricAKI in the setting of NVD, decreased PO intake -improving Creatininenow 1.68 from1.69,baseline on 05/22/19 was 1.16. - NS at 121mL/hr - Strict I and O's - Holding HCTZ  - Avoid nephrotoxic medications  EKG changes -stable Patient with new T-wave inversions in leads III, V5 and V6 on admission compared to previous EKG on 11/20/15. No chest pain, SOB today. Negative CXR in ED.HSTroponin 7x2. Repeat EKGwithout T-wave inversions - Consider stress testing/echocardiogram in the outpatient setting  Marijuana Use Pt daily smoker. Notes increased smoking to 3-4 sittings recently to help with his nausea. - Encouraged cessation  Tobacco Use Pt notes 43 year smoking history. Currently smokes about 7-8 cigarettes a day. -Declines nicotine patch. States he  has had no cravings and thinks he will quit all together.  HTN- stable BP this AM 110/82.Documented history of HTN. Home medications include amlodipine 10mg  qdand HCTZ 12.5 mg. - Continuehome Norvasc, hold HCTZ.   HIV- stable On anti-virals.RNA quant undetectable,follows with ID on a close basis. Home medications include emtricitabine-rilpivir-tenofovir AF 200-25-25 MG - Continue home medications - CD4 count: 513  FEN/GI: Full liquids  PPx: Lovenox  Disposition: Plan for discharge today pending EGD  Subjective:  Patient today feels much improved. No nausea or vomiting today. Patient had not had a bowel movement yet today but at the end of our encounter had the urge and I assisted him to the bathroom. He has only mild abdominal discomfort but states he is feeling much better since beginning the antibiotics. He continues to endorse some dizziness but says "its bearable". No headache.   Objective: Temp:  [98.2 F (36.8 C)-98.6 F (37 C)] 98.4 F (36.9 C) (07/08 1341) Pulse Rate:  [71-105] 79 (07/08 1341) Resp:  [17-18] 17 (07/08 1341) BP: (118-142)/(81-95) 118/84 (07/08 1341) SpO2:  [99 %-100 %] 100 % (07/08 1341) Weight:  [57.9 kg] 57.9 kg (07/08 7902) Physical Exam: General: Awake,alert, in no acute distress Cardiovascular: RRR, no murmurs Respiratory: No increased work of breathing, CTAB  Abdomen: soft, only mild tenderness to palpation in LLQ, normoactive BS Extremities: 2+ DP and radial pulses, no edema   Laboratory: Recent Labs  Lab 08/15/19 0449 08/16/19 0414 08/17/19 0412  WBC 11.3* 10.1 10.2  HGB 13.2 12.8* 14.9  HCT 39.7 40.0 45.2  PLT 391 371 434*   Recent Labs  Lab 08/14/19 1118 08/14/19 1118 08/15/19 0449 08/16/19 0414 08/17/19 0412  NA 137   < > 138  134* 135  K 3.7   < > 3.2* 3.6 3.8  CL 106   < > 110 111 110  CO2 14*   < > 13* 11* 13*  BUN 21*   < > 22* 19 16  CREATININE 2.92*   < > 2.18* 1.79* 1.69*  CALCIUM 10.0   < > 9.1 9.1 10.2   PROT 9.9*  --  8.2* 8.2*  --   BILITOT 0.9  --  0.6 0.7  --   ALKPHOS 75  --  64 69  --   ALT 14  --  12 10  --   AST 13*  --  12* 14*  --   GLUCOSE 111*   < > 99 94 123*   < > = values in this interval not displayed.    Imaging/Diagnostic Tests: CT Abd/Pelv 7/6 IMPRESSION: Moderate mesenteric adenopathy, possibly infectious or inflammatory in the acute setting. Unremarkable noncontrast examination of the pancreas.  DG Abd 1 View 7/5 IMPRESSION: Negative.  DG Chest 2 View 7/5 IMPRESSION: No active cardiopulmonary disease.   Sharion Settler, DO 08/17/2019, 6:38 PM PGY-1, [place] Intern pager: [phone], text pages welcome

## 2019-08-17 NOTE — Plan of Care (Signed)

## 2019-08-17 NOTE — H&P (View-Only) (Signed)
Progress Note    ASSESSMENT AND PLAN:   1. N/V with epi pain.  Neg CT for any etiology.  H/O significant cannabis use. 2. GERD on Pepcid 3. Diarrhea d/t giardiasis.  On Flagyl. Resolved. Neg colon 02/2012 4. HIV being followed by Dr. Johnnye Sima 5. Mesenteric lymphadenopathy on CT  Plan: -EGD in AM. -No need for colonoscopy at this time. -Continue Pepcid for now. -Continue Zofran as needed. -Small but more frequent meals -Repeat CT in 6 months to assess lymphadenopathy. -He has follow-up appointment with Dr. Johnnye Sima in coming weeks.    SUBJECTIVE  Stool studies positive for Giardia.  He has been started on Flagyl. Diarrhea has completely resolved. Had 2 episodes of vomiting early this morning.  Had significant associated heartburn, regurgitation.  No odynophagia or dysphagia.  OBJECTIVE:     Vital signs in last 24 hours: Temp:  [98.2 F (36.8 C)-98.6 F (37 C)] 98.4 F (36.9 C) (07/08 1341) Pulse Rate:  [71-105] 79 (07/08 1341) Resp:  [17-18] 17 (07/08 1341) BP: (118-142)/(81-95) 118/84 (07/08 1341) SpO2:  [99 %-100 %] 100 % (07/08 1341) Weight:  [57.9 kg] 57.9 kg (07/08 0633) Last BM Date: 08/17/19 General:   Alert, cachectic male Abdomen:  Soft, nondistended, nontender.  Normal bowel sounds,.       Neurologic:  Alert and  oriented x4;  grossly normal neurologically. Psych:  Pleasant, cooperative.  Normal mood and affect.   Intake/Output from previous day: 07/07 0701 - 07/08 0700 In: 240 [P.O.:240] Out: -  Intake/Output this shift: Total I/O In: 120 [P.O.:120] Out: -   Lab Results: Recent Labs    08/15/19 0449 08/16/19 0414 08/17/19 0412  WBC 11.3* 10.1 10.2  HGB 13.2 12.8* 14.9  HCT 39.7 40.0 45.2  PLT 391 371 434*   BMET Recent Labs    08/15/19 0449 08/16/19 0414 08/17/19 0412  NA 138 134* 135  K 3.2* 3.6 3.8  CL 110 111 110  CO2 13* 11* 13*  GLUCOSE 99 94 123*  BUN 22* 19 16  CREATININE 2.18* 1.79* 1.69*  CALCIUM 9.1 9.1 10.2    LFT Recent Labs    08/16/19 0414  PROT 8.2*  ALBUMIN 4.0  AST 14*  ALT 10  ALKPHOS 69  BILITOT 0.7   PT/INR No results for input(s): LABPROT, INR in the last 72 hours. Hepatitis Panel No results for input(s): HEPBSAG, HCVAB, HEPAIGM, HEPBIGM in the last 72 hours.  CT ABDOMEN PELVIS WO CONTRAST  Result Date: 08/15/2019 CLINICAL DATA:  Nausea, vomiting, abdominal pain, elevated serum lipase EXAM: CT ABDOMEN AND PELVIS WITHOUT CONTRAST TECHNIQUE: Multidetector CT imaging of the abdomen and pelvis was performed following the standard protocol without IV contrast. COMPARISON:  None. FINDINGS: Lower chest: The visualized lung bases are clear bilaterally. The visualized heart and pericardium are unremarkable Hepatobiliary: Liver and gallbladder are unremarkable Pancreas: The pancreas is unremarkable on this noncontrast examination. Spleen: Unremarkable Adrenals/Urinary Tract: Unremarkable Stomach/Bowel: The stomach is unremarkable. The large and small bowel are unremarkable. Appendix normal. No free intraperitoneal gas or fluid. Vascular/Lymphatic: Moderate aortoiliac atherosclerotic calcification without evidence of aneurysm. There is there is prominent mesenteric adenopathy identified with the index lymph node measuring 1.1 x 2.2 cm at axial image number sign 47. This is nonspecific and may be reactive as can be seen with infectious enterocolitis, inflammatory, as can be seen with mesenteric adenitis, or lymphoproliferative in nature. No additional pathologic adenopathy within the abdomen and pelvis. Reproductive: Unremarkable Other: None significant Musculoskeletal: Unilateral  left L5 pars defect is identified. No lytic or blastic bone lesions are seen. IMPRESSION: Moderate mesenteric adenopathy, possibly infectious or inflammatory in the acute setting. Unremarkable noncontrast examination of the pancreas. Electronically Signed   By: Fidela Salisbury MD   On: 08/15/2019 16:38     Active  Problems:   AKI (acute kidney injury) ([place])   Intractable vomiting with nausea   Nausea vomiting and diarrhea     LOS: 2 days     Carmell Austria, MD 08/17/2019, 2:46 PM [facility] [phone]

## 2019-08-17 NOTE — Anesthesia Preprocedure Evaluation (Addendum)
Anesthesia Evaluation  Patient identified by MRN, date of birth, ID band Patient awake    Reviewed: Allergy & Precautions, NPO status , Patient's Chart, lab work & pertinent test results  Airway Mallampati: II  TM Distance: >3 FB Neck ROM: Full    Dental no notable dental hx. (+) Teeth Intact, Dental Advisory Given   Pulmonary asthma , Current Smoker,    Pulmonary exam normal breath sounds clear to auscultation       Cardiovascular hypertension, Normal cardiovascular exam Rhythm:Regular Rate:Normal     Neuro/Psych  Headaches, Anxiety Depression    GI/Hepatic negative GI ROS, Neg liver ROS,   Endo/Other    Renal/GU Renal disease     Musculoskeletal negative musculoskeletal ROS (+)   Abdominal   Peds  Hematology  (+) HIV,   Anesthesia Other Findings   Reproductive/Obstetrics                            Anesthesia Physical Anesthesia Plan  ASA: III  Anesthesia Plan: MAC   Post-op Pain Management:    Induction: Intravenous  PONV Risk Score and Plan: Treatment may vary due to age or medical condition and Ondansetron  Airway Management Planned: Natural Airway and Nasal Cannula  Additional Equipment: None  Intra-op Plan:   Post-operative Plan:   Informed Consent: I have reviewed the patients History and Physical, chart, labs and discussed the procedure including the risks, benefits and alternatives for the proposed anesthesia with the patient or authorized representative who has indicated his/her understanding and acceptance.     Dental advisory given  Plan Discussed with:   Anesthesia Plan Comments: (EGD for N/V)       Anesthesia Quick Evaluation

## 2019-08-18 ENCOUNTER — Inpatient Hospital Stay (HOSPITAL_COMMUNITY): Payer: Self-pay | Admitting: Anesthesiology

## 2019-08-18 ENCOUNTER — Encounter (HOSPITAL_COMMUNITY)
Admission: EM | Disposition: A | Discharge status: Home / Self Care | Payer: Self-pay | Source: Home / Self Care | Attending: Family Medicine

## 2019-08-18 ENCOUNTER — Other Ambulatory Visit: Payer: Self-pay

## 2019-08-18 ENCOUNTER — Encounter (HOSPITAL_COMMUNITY): Payer: Self-pay | Admitting: Family Medicine

## 2019-08-18 DIAGNOSIS — I10 Essential (primary) hypertension: Secondary | ICD-10-CM

## 2019-08-18 DIAGNOSIS — K269 Duodenal ulcer, unspecified as acute or chronic, without hemorrhage or perforation: Secondary | ICD-10-CM

## 2019-08-18 DIAGNOSIS — K2100 Gastro-esophageal reflux disease with esophagitis, without bleeding: Secondary | ICD-10-CM

## 2019-08-18 HISTORY — PX: BIOPSY: SHX5522

## 2019-08-18 HISTORY — PX: ESOPHAGOGASTRODUODENOSCOPY (EGD) WITH PROPOFOL: SHX5813

## 2019-08-18 LAB — CBC
HCT: 42.7 % (ref 39.0–52.0)
Hemoglobin: 14.1 g/dL (ref 13.0–17.0)
MCH: 32.7 pg (ref 26.0–34.0)
MCHC: 33 g/dL (ref 30.0–36.0)
MCV: 99.1 fL (ref 80.0–100.0)
Platelets: 428 10*3/uL — ABNORMAL HIGH (ref 150–400)
RBC: 4.31 MIL/uL (ref 4.22–5.81)
RDW: 12.2 % (ref 11.5–15.5)
WBC: 9.4 10*3/uL (ref 4.0–10.5)
nRBC: 0 % (ref 0.0–0.2)

## 2019-08-18 LAB — BASIC METABOLIC PANEL
Anion gap: 12 (ref 5–15)
BUN: 20 mg/dL (ref 6–20)
CO2: 12 mmol/L — ABNORMAL LOW (ref 22–32)
Calcium: 9.6 mg/dL (ref 8.9–10.3)
Chloride: 109 mmol/L (ref 98–111)
Creatinine, Ser: 1.68 mg/dL — ABNORMAL HIGH (ref 0.61–1.24)
GFR calc Af Amer: 53 mL/min — ABNORMAL LOW (ref >60)
GFR calc non Af Amer: 46 mL/min — ABNORMAL LOW (ref >60)
Glucose, Bld: 98 mg/dL (ref 70–99)
Potassium: 3.7 mmol/L (ref 3.5–5.1)
Sodium: 133 mmol/L — ABNORMAL LOW (ref 135–145)

## 2019-08-18 SURGERY — ESOPHAGOGASTRODUODENOSCOPY (EGD) WITH PROPOFOL
Anesthesia: Monitor Anesthesia Care

## 2019-08-18 MED ORDER — PROPOFOL 10 MG/ML IV BOLUS
INTRAVENOUS | Status: DC | PRN
  Administered 2019-08-18 (×2): 40 mg via INTRAVENOUS
  Administered 2019-08-18: 20 mg via INTRAVENOUS

## 2019-08-18 MED ORDER — SODIUM CHLORIDE 0.9 % IV SOLN: INTRAVENOUS | Status: DC

## 2019-08-18 MED ORDER — LIDOCAINE HCL (CARDIAC) PF 100 MG/5ML IV SOSY
PREFILLED_SYRINGE | INTRAVENOUS | Status: DC | PRN
  Administered 2019-08-18: 40 mg via INTRAVENOUS

## 2019-08-18 MED ORDER — AMLODIPINE BESYLATE 10 MG PO TABS: ORAL_TABLET | ORAL | 0 refills | Status: DC

## 2019-08-18 MED ORDER — PANTOPRAZOLE SODIUM 40 MG PO TBEC
40.0000 mg | DELAYED_RELEASE_TABLET | Freq: Every day | ORAL | Status: DC
  Administered 2019-08-19: 40 mg via ORAL
  Filled 2019-08-18: qty 1

## 2019-08-18 MED ORDER — METRONIDAZOLE 500 MG PO TABS: 500.0000 mg | ORAL_TABLET | Freq: Two times a day (BID) | ORAL | 0 refills | Status: DC

## 2019-08-18 MED ORDER — OMEPRAZOLE 20 MG PO CPDR: 20.0000 mg | DELAYED_RELEASE_CAPSULE | Freq: Every day | ORAL | 0 refills | Status: DC

## 2019-08-18 MED ORDER — BICTEGRAVIR-EMTRICITAB-TENOFOV 50-200-25 MG PO TABS: 1.0000 | ORAL_TABLET | Freq: Every day | ORAL | 1 refills | Status: DC

## 2019-08-18 MED ORDER — BICTEGRAVIR-EMTRICITAB-TENOFOV 50-200-25 MG PO TABS: 1.0000 | ORAL_TABLET | Freq: Every day | ORAL | 0 refills | Status: DC

## 2019-08-18 MED ORDER — METRONIDAZOLE 500 MG PO TABS: 500.0000 mg | ORAL_TABLET | Freq: Two times a day (BID) | ORAL | 0 refills | Status: AC

## 2019-08-18 MED ORDER — PROPOFOL 500 MG/50ML IV EMUL
INTRAVENOUS | Status: DC | PRN
  Administered 2019-08-18: 100 ug/kg/min via INTRAVENOUS

## 2019-08-18 MED ORDER — BICTEGRAVIR-EMTRICITAB-TENOFOV 50-200-25 MG PO TABS
1.0000 | ORAL_TABLET | Freq: Every day | ORAL | Status: DC
  Administered 2019-08-19: 1 via ORAL
  Filled 2019-08-18: qty 1

## 2019-08-18 SURGICAL SUPPLY — 15 items

## 2019-08-18 NOTE — Transfer of Care (Signed)
Immediate Anesthesia Transfer of Care Note  Patient: Ryan Cruz  Procedure(s) Performed: ESOPHAGOGASTRODUODENOSCOPY (EGD) WITH PROPOFOL (N/A ) BIOPSY  Patient Location: Endoscopy Unit  Anesthesia Type:MAC  Level of Consciousness: drowsy and patient cooperative  Airway & Oxygen Therapy: Patient Spontanous Breathing and Patient connected to nasal cannula oxygen  Post-op Assessment: Report given to RN and Post -op Vital signs reviewed and stable  Post vital signs: Reviewed and stable  Last Vitals:  Vitals Value Taken Time  BP 108/61 08/18/19 1327  Temp    Pulse 84 08/18/19 1328  Resp 20 08/18/19 1328  SpO2 94 % 08/18/19 1328  Vitals shown include unvalidated device data.  Last Pain:  Vitals:   08/18/19 1240  TempSrc: Temporal  PainSc: 0-No pain         Complications: No complications documented.

## 2019-08-18 NOTE — Op Note (Signed)
[facility] Patient Name: Ryan Cruz Procedure Date : 08/18/2019 MRN: [id number] Attending MD: Jackquline Denmark , MD Date of Birth: December 16, 1965 CSN: [id number] Age: 54 Admit Type: Inpatient Procedure:                Upper GI endoscopy Indications:              Recurrent nausea/vomiting. History of significant                            heartburn. H/O HIV Providers:                Jackquline Denmark, MD, Clyde Lundborg, RN, Theodora Blow,                            Technician Referring MD:              Medicines:                Monitored Anesthesia Care Complications:            No immediate complications. Estimated Blood Loss:     Estimated blood loss: none. Procedure:                Pre-Anesthesia Assessment:                           - Prior to the procedure, a History and Physical                            was performed, and patient medications and                            allergies were reviewed. The patient's tolerance of                            previous anesthesia was also reviewed. The risks                            and benefits of the procedure and the sedation                            options and risks were discussed with the patient.                            All questions were answered, and informed consent                            was obtained. Prior Anticoagulants: The patient has                            taken no previous anticoagulant or antiplatelet                            agents. ASA Grade Assessment: II - A patient with                            mild  systemic disease. After reviewing the risks                            and benefits, the patient was deemed in                            satisfactory condition to undergo the procedure.                           After obtaining informed consent, the endoscope was                            passed under direct vision. Throughout the                            procedure, the patient's blood pressure,  pulse, and                            oxygen saturations were monitored continuously. The                            GIF-H190 ([id number]) Olympus gastroscope was                            introduced through the mouth, and advanced to the                            second part of duodenum. The upper GI endoscopy was                            accomplished without difficulty. The patient                            tolerated the procedure well. Scope In: Scope Out: Findings:      The esophagus was mildly tortuous in the distal one third of the       esophagus d/t HH. LA Grade B (one or more mucosal breaks greater than 5       mm, not extending between the tops of two mucosal folds) esophagitis       with no bleeding was found 35 cm from the incisors at GE junction. A       wide open Schatzki's ring was also noted at GE junction. Biopsies were       taken with a cold forceps for histology.      A 3 cm hiatal hernia was present extending from 35 up to 38 cm.       Retroflexed examination of the cardia revealed GE junction flap to be       [facility] gd IV.      The entire examined stomach was normal. Biopsies were taken with a cold       forceps for histology to r/o HP.      A few localized erosions (2-3 mm) without bleeding were found in the       duodenal bulb and in the first portion of the duodenum. The second       portion of  the duodenum was normal. Biopsies were taken with a cold       forceps for histology from first and second portion of duodenum. Impression:               - LA Grade B reflux esophagitis with no bleeding.                            Biopsied.                           - 3 cm hiatal hernia.                           - Normal stomach. Biopsied.                           - Duodenal erosions without bleeding. Biopsied. Recommendation:           - Return patient to hospital ward for ongoing care.                           - Resume previous diet.                           -  Ideally would recommend omeprazole 20 mg p.o.                            once a day x 12 weeks, then can switch to QOD. Have                            to get it cleared from ID (d/t interaction with HIV                            medicines). Other choice would be high-dose H2                            blockers-Pepcid 40 mg p.o. twice daily x 24 weeks,                            then once a day. He would still have breakthrough                            symptoms with Pepcid.                           - Await pathology results.                           - Avoid ibuprofen, naproxen, or other non-steroidal                            anti-inflammatory drugs.                           - Follow an antireflux regimen.                           -  Stop all marijuana use.                           - Will sign off for now.                           - Please call if with any ?/Problems. He should                            follow-up in GI clinic in 8 to 12 weeks. Procedure Code(s):        --- Professional ---                           [phone], Esophagogastroduodenoscopy, flexible,                            transoral; with biopsy, single or multiple Diagnosis Code(s):        --- Professional ---                           K21.00, Gastro-esophageal reflux disease with                            esophagitis, without bleeding                           K44.9, Diaphragmatic hernia without obstruction or                            gangrene                           K26.9, Duodenal ulcer, unspecified as acute or                            chronic, without hemorrhage or perforation                           W10.93, Cyclical vomiting syndrome unrelated to                            migraine CPT copyright 2019 American Medical Association. All rights reserved. The codes documented in this report are preliminary and upon coder review may  be revised to meet current compliance requirements. Jackquline Denmark,  MD 08/18/2019 1:31:02 PM This report has been signed electronically. Number of Addenda: 0

## 2019-08-18 NOTE — Anesthesia Postprocedure Evaluation (Signed)
Anesthesia Post Note  Patient: Ryan Cruz  Procedure(s) Performed: ESOPHAGOGASTRODUODENOSCOPY (EGD) WITH PROPOFOL (N/A ) BIOPSY     Patient location during evaluation: Endoscopy Anesthesia Type: MAC Level of consciousness: awake and alert Pain management: pain level controlled Vital Signs Assessment: post-procedure vital signs reviewed and stable Respiratory status: spontaneous breathing, nonlabored ventilation, respiratory function stable and patient connected to nasal cannula oxygen Cardiovascular status: blood pressure returned to baseline and stable Postop Assessment: no apparent nausea or vomiting Anesthetic complications: no   No complications documented.  Last Vitals:  Vitals:   08/18/19 1328 08/18/19 1338  BP: 108/61 129/82  Pulse: 83 87  Resp: 19 20  Temp: 37.1 C   SpO2: 94% 98%    Last Pain:  Vitals:   08/18/19 1338  TempSrc:   PainSc: 0-No pain                 Barnet Glasgow

## 2019-08-18 NOTE — Plan of Care (Signed)

## 2019-08-18 NOTE — Progress Notes (Signed)
Pt admitted to be intermittently adherence to his Odefsey in the past few months. [facility] said that he has not fill his Vernell Leep since January. He probably got some supply from [facility] through [place]. His CD4 is healthy. We are switching him to McKenna today due to interaction between [facility] and PPI that he will be on for the foreseeable future. He has an appt with [facility] in August and a VL can be done at that time.   In the meantime, we will get a VL with reflex while he is here after discussion with [facility] this  PM. Once again, counseled him on the importance of adherence this PM.   Onnie Boer, PharmD, BCIDP, AAHIVP, CPP Infectious Disease Pharmacist 08/18/2019 5:43 PM

## 2019-08-18 NOTE — Progress Notes (Signed)
Pt came back to unit in a wheelchair, assisted to bed, vital signs taken and recorded, Pt denies pain, now resting comfortably in bed.

## 2019-08-18 NOTE — Interval H&P Note (Signed)
History and Physical Interval Note:  08/18/2019 1:06 PM  Ryan Cruz  has presented today for surgery, with the diagnosis of N/N.  The various methods of treatment have been discussed with the patient and family. After consideration of risks, benefits and other options for treatment, the patient has consented to  Procedure(s): ESOPHAGOGASTRODUODENOSCOPY (EGD) WITH PROPOFOL (N/A) as a surgical intervention.  The patient's history has been reviewed, patient examined, no change in status, stable for surgery.  I have reviewed the patient's chart and labs.  Questions were answered to the patient's satisfaction.     Jackquline Denmark

## 2019-08-19 LAB — BASIC METABOLIC PANEL
Anion gap: 7 (ref 5–15)
BUN: 16 mg/dL (ref 6–20)
CO2: 16 mmol/L — ABNORMAL LOW (ref 22–32)
Calcium: 8.2 mg/dL — ABNORMAL LOW (ref 8.9–10.3)
Chloride: 112 mmol/L — ABNORMAL HIGH (ref 98–111)
Creatinine, Ser: 1.37 mg/dL — ABNORMAL HIGH (ref 0.61–1.24)
GFR calc Af Amer: >60 mL/min (ref >60)
GFR calc non Af Amer: 59 mL/min — ABNORMAL LOW (ref >60)
Glucose, Bld: 94 mg/dL (ref 70–99)
Potassium: 3.1 mmol/L — ABNORMAL LOW (ref 3.5–5.1)
Sodium: 135 mmol/L (ref 135–145)

## 2019-08-19 MED ORDER — ONDANSETRON HCL 4 MG PO TABS: 4.0000 mg | ORAL_TABLET | Freq: Four times a day (QID) | ORAL | 0 refills | Status: DC | PRN

## 2019-08-19 MED ORDER — POTASSIUM CHLORIDE CRYS ER 20 MEQ PO TBCR
40.0000 meq | EXTENDED_RELEASE_TABLET | Freq: Once | ORAL | Status: AC
  Administered 2019-08-19: 40 meq via ORAL
  Filled 2019-08-19: qty 2

## 2019-08-19 NOTE — Care Management (Signed)
Discussed prescriptions with patient.  The patient states all of his prescriptions are covered under the [employer] plan and he will be able to fill all scripts at [facility] today.

## 2019-08-19 NOTE — Discharge Instructions (Signed)
Dear [name],   Thank you so much for allowing [place] to be part of your care!  You were admitted to [facility] for worsening nausea, vomiting, and diarrhea.  It was found that you had an infection with Giardia that has significantly improved after treatment with antibiotics.  Additionally through the scope of your esophagus and stomach, found some inflammation of your esophagus and part of your small bowel.  You will take antiacid medication for this.   POST-HOSPITAL & CARE INSTRUCTIONS 1. It is of the utmost importance that you take your Biktarvy every single day for control. 2. Please follow-up with your infectious disease and gastroenterology providers. 3. Stop all marijuana use, may be contributing to nausea and vomiting.  Please avoid NSAIDs (ibuprofen, meloxicam, Aleve, Motrin).  4. Please let PCP/Specialists know of any changes that were made.  5. Please see medications section of this packet for any medication changes.   DOCTOR'S APPOINTMENT & FOLLOW UP CARE INSTRUCTIONS  Future Appointments  Date Time Provider [place]  09/12/2019  2:15 PM [facility]  09/26/2019  3:00 PM [place], FNP [facility]    RETURN PRECAUTIONS: If you continue to have frequent vomiting despite treatment, recurrence of diarrhea, unexpected weight loss.  Take care and be well!  [facility]  [place], [place] [address] [phone]

## 2019-08-19 NOTE — Plan of Care (Signed)

## 2019-08-19 NOTE — Progress Notes (Signed)
Family Medicine Teaching Service Daily Progress Note Intern Pager: [phone]  Patient name: Ryan Cruz Medical record number: [id number] Date of birth: [birth year] Age: 54 y.o. Gender: male  Primary Care Provider: Charlott Rakes, MD Consultants: GI Code Status: FULL  Pt Overview and Major Events to Date:  Admitted 7/5  Assessment and Plan: Ryan Cruz a 54 y.o.malepresenting with nausea, vomiting and diarrhea since Wednesday (6/30). PMH is significant for HIV on antivirals,HTN, marijuana and tobacco use.  Nausea, vomiting, diarrhea 2/2 Giardia EGD yesterday notable for reflux esophagitis, hiatal hernia and non-bleeding duodenal erosions. Pathology pending. Today patient overall feels much improved.On exam, he is only mildly tender to LUQ. Patient continued on Flagyl 500mg  BID x5 days, 7/7-7/12. - ZofranIVPRNnausea -40 mg Protonix qd - Per GI recommendations: omeprazone 20mg  PO qd x12 weeks, then switch to QOD. HIV medications have been changed to Vayas due to interactions with his previous medication.  - Acetaminophen 650mg  PRN pain or fever  GERD - improving - 40mg  Protonix - Continue outpatient PPI x12 weeks then QOD  Non-oliguricAKI in the setting of NVD, decreased PO intake -improving Creatinine improved to 1.37 from 2.92 on admission. - Plan to discontinue HCTZ on discharge - Avoid nephrotoxic medications  EKG changes -stable Patient with new T-wave inversions in leads III, V5 and V6 on admission compared to previous EKG on 11/20/15. No chest pain, SOB today. Negative CXR in ED.HSTroponin 7x2.RepeatEKGwithout T-wave inversions - Consider stress testing/echocardiogram in the outpatient setting  Marijuana Use Pt daily smoker. Notes increased smoking to 3-4 sittings recently to help with his nausea. - Encouraged cessation  Tobacco Use Pt notes 43 year smoking history. Currently smokes about 7-8 cigarettes a day. -Declines nicotine patch.  States he has had no cravings and thinks he will quit all together.  HTN- stable BP this AM110/82.Documented history of HTN. Home medications include amlodipine 10mg  qdand HCTZ 12.5 mg. - Continuehome Norvasc, hold HCTZ.   HIV- stable On anti-virals.RNA quant undetectable,follows with ID on a close basis. Home medications include emtricitabine-rilpivir-tenofovir AF 200-25-25 MG - Continue home medications - CD4 count: 513  FEN/GI: Regular PPx: SCDs  Disposition: Discharge   Subjective:  Patient is awake listening to music and eating in bed. He states he is feeling much improved. He is aware of his HIV medication change. He has a follow up with his ID physician in August but will try and reschedule for a sooner date. He has had no nausea, vomiting or bowel movements today. He is excited to go home to see his dogs today.  Objective: Temp:  [97.5 F (36.4 C)-98.8 F (37.1 C)] 98.4 F (36.9 C) (07/10 0833) Pulse Rate:  [64-87] 67 (07/10 0833) Resp:  [15-20] 16 (07/10 0833) BP: (108-135)/(61-82) 127/79 (07/10 0833) SpO2:  [94 %-100 %] 100 % (07/10 0833) Weight:  [58.8 kg] 58.8 kg (07/10 0500) Physical Exam: General: Awake, alert, eating breakfast  Cardiovascular: RRR, no murmurs Respiratory: CTAB Abdomen: Soft, mild tenderness LUQ, no distension or guarding Extremities: no edema  Laboratory: Recent Labs  Lab 08/16/19 0414 08/17/19 0412 08/18/19 0654  WBC 10.1 10.2 9.4  HGB 12.8* 14.9 14.1  HCT 40.0 45.2 42.7  PLT 371 434* 428*   Recent Labs  Lab 08/14/19 1118 08/14/19 1118 08/15/19 0449 08/15/19 0449 08/16/19 0414 08/16/19 0414 08/17/19 0412 08/18/19 0654 08/19/19 0650  NA 137   < > 138   < > 134*   < > 135 133* 135  K 3.7   < > 3.2*   < >  3.6   < > 3.8 3.7 3.1*  CL 106   < > 110   < > 111   < > 110 109 112*  CO2 14*   < > 13*   < > 11*   < > 13* 12* 16*  BUN 21*   < > 22*   < > 19   < > 16 20 16   CREATININE 2.92*   < > 2.18*   < > 1.79*   < >  1.69* 1.68* 1.37*  CALCIUM 10.0   < > 9.1   < > 9.1   < > 10.2 9.6 8.2*  PROT 9.9*  --  8.2*  --  8.2*  --   --   --   --   BILITOT 0.9  --  0.6  --  0.7  --   --   --   --   ALKPHOS 75  --  64  --  69  --   --   --   --   ALT 14  --  12  --  10  --   --   --   --   AST 13*  --  12*  --  14*  --   --   --   --   GLUCOSE 111*   < > 99   < > 94   < > 123* 98 94   < > = values in this interval not displayed.    Imaging/Diagnostic Tests: EGD 7/9 - LA Grade B reflux esophagitis with no bleeding. Biopsied. - 3 cm hiatal hernia.  - Normal stomach. Biopsied.  - Duodenal erosions without bleeding. Biopsied  Sharion Settler, DO 08/19/2019, 10:06 AM PGY-1, [place] Intern pager: [phone], text pages welcome

## 2019-08-19 NOTE — Plan of Care (Signed)
IV removed at d/c.

## 2019-08-19 NOTE — Plan of Care (Signed)
Patient discharged home with friend via W/C. No concerns voiced at d/c. AVS reviewed with patient upon discharge. No belongings left in room. Stable at discharge.

## 2019-08-20 ENCOUNTER — Other Ambulatory Visit: Payer: Self-pay | Admitting: Physician Assistant

## 2019-08-20 ENCOUNTER — Encounter (HOSPITAL_COMMUNITY): Payer: Self-pay | Admitting: Gastroenterology

## 2019-08-21 ENCOUNTER — Telehealth: Payer: Self-pay

## 2019-08-21 NOTE — Telephone Encounter (Signed)
Transition Care Management Follow-up Telephone Call  Date of discharge and from where: 08/19/2019, [facility]   How have you been since you were released from the hospital? He said that he is feeling pretty good. He was concerned that that his  Both feet and ankles have been swelling since he came home.  He is not sure if it is because they are more dependent when he is sitting up.  He said that the swelling does go down when he elevates them  He has not tried to put on shoes. He stated that they are not painful but he does have occasional tingling in his feet.    Any questions or concerns?  noted above  Items Reviewed:  Did the pt receive and understand the discharge instructions provided? yes  Medications obtained and verified?  he said that he has all medications, including the new ones and did not have any questions about the med regime   Any new allergies since your discharge?  none reported   Do you have support at home?  he said he is not alone.   No home health or DME ordered  Functional Questionnaire: (I = Independent and D = Dependent) ADLs: independent  Follow up appointments reviewed:   PCP Hospital f/u appt confirmed? Dr Margarita Rana 09/05/2019 @ [facility] f/u appt confirmed?[facility] 09/26/2019  Are transportation arrangements needed? no  If their condition worsens, is the pt aware to call PCP or go to the Emergency Dept.?} yes  Was the patient provided with contact information for the PCP's office or ED?  he has the clinic phone number  Was to pt encouraged to call back with questions or concerns?  yes

## 2019-08-21 NOTE — Telephone Encounter (Signed)
From the discharge call.  He has appt with Dr Margarita Rana 09/05/2019    He said that he is feeling pretty good. He was concerned that that his  Both feet and ankles have been swelling since he came home.  He is not sure if it is because they are more dependent when he is sitting up.  He said that the swelling does go down when he elevates them  He has not tried to put on shoes. He stated that they are not painful but he does have occasional tingling in his feet.      he said that he has all medications, including the new ones and did not have any questions about the med regime

## 2019-08-22 LAB — SURGICAL PATHOLOGY

## 2019-08-23 NOTE — Progress Notes (Signed)
  Subjective:  Patient ID: Ryan Cruz, male    DOB: [birth year],  MRN: [id number]  Chief Complaint  Patient presents with  . Callouses    L plantar forefoot submet 5, interdigital 4-5. Pt stated, "I cut out the one that's between my toes. It can be very painful".  . Skin Problem    R plantar midfoot - lesion. x2-3 weeks. Pt stated, "I didn't step on anything. It looked swollen at first. Pain = 3-10/10".    54 y.o. male presents with the above complaint. History confirmed with patient.   Objective:  Physical Exam: warm, good capillary refill, no trophic changes or ulcerative lesions, normal DP and PT pulses and normal sensory exam.  Left Foot: normal exam, no swelling, tenderness, instability; ligaments intact, full range of motion of all ankle/foot joints . HPK submet 5 Right Foot: normal exam, no swelling, tenderness, instability; ligaments intact, full range of motion of all ankle/foot joints Plantar midfoot punctate keratosis  Assessment:   1. Metatarsal deformity, left   2. Capsulitis of metatarsophalangeal (MTP) joint of left foot   3. Porokeratosis      Plan:  Patient was evaluated and treated and all questions answered.  Capsulitis, Porokeratosis -Educated on etiology -Debrided, courtesy -Educated on self-care -Recommend revitaderm cream -F/u PRN  Return if symptoms worsen or fail to improve.

## 2019-08-29 LAB — HIV GENOSURE(R) MG

## 2019-08-29 LAB — HIV-1 RNA, PCR (GRAPH) RFX/GENO EDI
HIV-1 RNA BY PCR: 520 copies/mL
HIV-1 RNA Quant, Log: 2.716 log10copy/mL

## 2019-08-29 LAB — REFLEX TO GENOSURE(R) MG EDI: HIV GenoSure(R): 1

## 2019-09-05 ENCOUNTER — Ambulatory Visit: Payer: Self-pay | Attending: Family Medicine | Admitting: Family Medicine

## 2019-09-05 ENCOUNTER — Other Ambulatory Visit: Payer: Self-pay

## 2019-09-05 ENCOUNTER — Encounter: Payer: Self-pay | Admitting: Family Medicine

## 2019-09-05 VITALS — BP 144/83 | HR 76 | Ht 67.0 in | Wt 144.6 lb

## 2019-09-05 DIAGNOSIS — B20 Human immunodeficiency virus [HIV] disease: Secondary | ICD-10-CM

## 2019-09-05 DIAGNOSIS — I10 Essential (primary) hypertension: Secondary | ICD-10-CM

## 2019-09-05 DIAGNOSIS — E876 Hypokalemia: Secondary | ICD-10-CM

## 2019-09-05 DIAGNOSIS — K625 Hemorrhage of anus and rectum: Secondary | ICD-10-CM

## 2019-09-05 MED ORDER — AMLODIPINE BESYLATE 10 MG PO TABS: ORAL_TABLET | ORAL | 6 refills | Status: DC

## 2019-09-05 MED ORDER — LOSARTAN POTASSIUM 25 MG PO TABS: 25.0000 mg | ORAL_TABLET | Freq: Every day | ORAL | 6 refills | Status: DC

## 2019-09-05 NOTE — Progress Notes (Signed)
Subjective:  Patient ID: Ryan Cruz, male    DOB: Jul 04, 1965  Age: 54 y.o. MRN: [id number]  CC: Hospitalization Follow-up   HPI Merton E Behar is a 54 year old male with a history of HIV, hypertension here for follow-up visit after hospitalization for acute kidney injury secondary to gastroenteritis from Giardia which was treated with IV fluids and metronidazole. Upper endoscopy revealed nonbleeding hiatal hernia, nonbleeding peptic ulcer. His creatinine improved to 1.37 at discharge down from a peak of 2.92 on admission. During his hospitalization hydrochlorothiazide was held due to soft blood pressure.  His ankles have been edematous since discharge and he has to elevate his feet for improvement. He has also noticed bright red blood in his stool when he eats certain foods like Oreo cookies or crackers which he discontinued with some improvement. Now rectal bleed occurs "as a trickle" every other day.  He admits to previously having hemorrhoids but denies being constipated. Denies abdominal pain, nausea His blood pressures have been 140/74 at home and he endorses compliance with amlodipine. He has no additional concerns today.  Past Medical History:  Diagnosis Date  . Anxiety   . Asthma   . Depression   . HIV (human immunodeficiency virus infection) ([place])   . Hypertension   . Hypoglycemia    sugars drop ion    Past Surgical History:  Procedure Laterality Date  . BIOPSY  08/18/2019   Procedure: BIOPSY;  Surgeon: Jackquline Denmark, MD;  Location: [facility] ENDOSCOPY;  Service: Endoscopy;;  . COLONOSCOPY  02/12/2012   Procedure: COLONOSCOPY;  Surgeon: Leighton Ruff, MD;  Location: [facility] ENDOSCOPY;  Service: Endoscopy;  Laterality: N/A;  . ESOPHAGOGASTRODUODENOSCOPY (EGD) WITH PROPOFOL N/A 08/18/2019   Procedure: ESOPHAGOGASTRODUODENOSCOPY (EGD) WITH PROPOFOL;  Surgeon: Jackquline Denmark, MD;  Location: [facility] ENDOSCOPY;  Service: Endoscopy;  Laterality: N/A;  . HERNIA REPAIR     umbilibcal    Family  History  Problem Relation Age of Onset  . Diabetes Mother   . Hypertension Mother   . Heart disease Mother   . Congestive Heart Failure Mother   . Diabetes Father   . Hypertension Father   . Diabetes Sister   . HIV Sister   . Hypertension Sister   . Diabetes Brother   . Hypertension Brother   . Diabetes Brother   . Hypertension Brother   . Diabetes Brother   . Hypertension Brother   . Diabetes Brother   . Hypertension Brother     Allergies  Allergen Reactions  . Doxycycline Rash    Outpatient Medications Prior to Visit  Medication Sig Dispense Refill  . acetaminophen (TYLENOL) 325 MG tablet Take 650 mg by mouth every 6 (six) hours as needed.    . bictegravir-emtricitabine-tenofovir AF (BIKTARVY) 50-200-25 MG TABS tablet Take 1 tablet by mouth daily. 30 tablet 1  . Cyanocobalamin (VITAMIN B 12 PO) Take 1 tablet by mouth daily.    Marland Kitchen omeprazole (PRILOSEC) 20 MG capsule Take 1 capsule (20 mg total) by mouth daily. 30 capsule 0  . ondansetron (ZOFRAN) 4 MG tablet Take 1 tablet (4 mg total) by mouth every 6 (six) hours as needed for nausea. 5 tablet 0  . pseudoephedrine-acetaminophen (TYLENOL SINUS) 30-500 MG TABS tablet Take 1 tablet by mouth every 4 (four) hours as needed (sinus congestion).    Marland Kitchen amLODipine (NORVASC) 10 MG tablet Take 1 tablet by mouth daily. 30 tablet 0   No facility-administered medications prior to visit.     ROS Review of Systems  Constitutional: Negative for activity change and appetite change.  HENT: Negative for sinus pressure and sore throat.   Eyes: Negative for visual disturbance.  Respiratory: Negative for cough, chest tightness and shortness of breath.   Cardiovascular: Negative for chest pain and leg swelling.  Gastrointestinal: Positive for blood in stool. Negative for abdominal distention, abdominal pain, constipation and diarrhea.  Endocrine: Negative.   Genitourinary: Negative for dysuria.  Musculoskeletal: Negative for joint swelling  and myalgias.  Skin: Negative for rash.  Allergic/Immunologic: Negative.   Neurological: Negative for weakness, light-headedness and numbness.  Psychiatric/Behavioral: Negative for dysphoric mood and suicidal ideas.    Objective:  BP (!) 144/83   Pulse 76   Ht 5\' 7"  (1.702 m)   Wt 144 lb 9.6 oz (65.6 kg)   SpO2 100%   BMI 22.65 kg/m   BP/Weight 09/05/2019 08/19/2019 2/35/5732  Systolic BP 202 542 706  Diastolic BP 83 79 82  Wt. (Lbs) 144.6 129.63 146  BMI 22.65 20.3 22.87      Physical Exam Constitutional:      Appearance: He is well-developed.  Neck:     Vascular: No JVD.  Cardiovascular:     Rate and Rhythm: Normal rate.     Heart sounds: Normal heart sounds. No murmur heard.   Pulmonary:     Effort: Pulmonary effort is normal.     Breath sounds: Normal breath sounds. No wheezing or rales.  Chest:     Chest wall: No tenderness.  Abdominal:     General: Bowel sounds are normal. There is no distension.     Palpations: Abdomen is soft. There is no mass.     Tenderness: There is no abdominal tenderness.  Musculoskeletal:        General: Normal range of motion.     Right lower leg: No edema.     Left lower leg: No edema.  Neurological:     Mental Status: He is alert and oriented to person, place, and time.  Psychiatric:        Mood and Affect: Mood normal.     CMP Latest Ref Rng & Units 08/19/2019 08/18/2019 08/17/2019  Glucose 70 - 99 mg/dL 94 98 123(H)  BUN 6 - 20 mg/dL 16 20 16   Creatinine 0.61 - 1.24 mg/dL 1.37(H) 1.68(H) 1.69(H)  Sodium 135 - 145 mmol/L 135 133(L) 135  Potassium 3.5 - 5.1 mmol/L 3.1(L) 3.7 3.8  Chloride 98 - 111 mmol/L 112(H) 109 110  CO2 22 - 32 mmol/L 16(L) 12(L) 13(L)  Calcium 8.9 - 10.3 mg/dL 8.2(L) 9.6 10.2  Total Protein 6.5 - 8.1 g/dL - - -  Total Bilirubin 0.3 - 1.2 mg/dL - - -  Alkaline Phos 38 - 126 U/L - - -  AST 15 - 41 U/L - - -  ALT 0 - 44 U/L - - -    Lipid Panel     Component Value Date/Time   CHOL 162 05/22/2019  1020   TRIG 99 05/22/2019 1020   HDL 44 05/22/2019 1020   CHOLHDL 3.7 05/22/2019 1020   VLDL 37 (H) 02/28/2015 1655   LDLCALC 99 05/22/2019 1020    CBC    Component Value Date/Time   WBC 9.4 08/18/2019 0654   RBC 4.31 08/18/2019 0654   HGB 14.1 08/18/2019 0654   HCT 42.7 08/18/2019 0654   PLT 428 (H) 08/18/2019 0654   MCV 99.1 08/18/2019 0654   MCH 32.7 08/18/2019 0654   MCHC 33.0 08/18/2019 0654  RDW 12.2 08/18/2019 0654   LYMPHSABS 2.7 08/15/2019 0449   MONOABS 0.8 08/15/2019 0449   EOSABS 0.1 08/15/2019 0449   BASOSABS 0.1 08/15/2019 0449    Lab Results  Component Value Date   HGBA1C 5.8 04/24/2016    Assessment & Plan:  1. Human immunodeficiency virus (HIV) disease ([place]) Currently on antiretroviral therapy Followed by infectious disease  2. Rectal bleeding Bright red blood per rectum suspicious for lower GI bleed Cannot exclude hemorrhoids We have discussed sitz bath, avoiding constipation and I will check his hemoglobin Advised to notify the clinic if symptoms persist for GI referral for Colonoscopy - CBC with Differential/Platelet  3. Essential hypertension Uncontrolled Given previous complaint of pedal edema will have to place on hydrochlorothiazide which he declines We will place on losartan given he also was hypokalemic - Basic Metabolic Panel - losartan (COZAAR) 25 MG tablet; Take 1 tablet (25 mg total) by mouth daily.  Dispense: 30 tablet; Refill: 6 - amLODipine (NORVASC) 10 MG tablet; Take 1 tablet by mouth daily.  Dispense: 30 tablet; Refill: 6  4. Hypokalemia Last potassium was 3.1 We will check level today    Meds ordered this encounter  Medications  . losartan (COZAAR) 25 MG tablet    Sig: Take 1 tablet (25 mg total) by mouth daily.    Dispense:  30 tablet    Refill:  6  . amLODipine (NORVASC) 10 MG tablet    Sig: Take 1 tablet by mouth daily.    Dispense:  30 tablet    Refill:  6    Follow-up: Return in about 6 months (around  03/07/2020) for Chronic disease management.       Charlott Rakes, MD, FAAFP. [facility] and [place], [place]   09/05/2019, 12:10 PM

## 2019-09-05 NOTE — Progress Notes (Signed)
HFU for AKI  Having bright red blood from the rectum, he thinks it may be his ulcer.

## 2019-09-05 NOTE — Patient Instructions (Signed)
Rectal Bleeding  Rectal bleeding is when blood comes out of the opening of the butt (anus). People with this kind of bleeding may notice bright red blood in their underwear or in the toilet after they poop (have a bowel movement). They may also have dark red or black poop (stool). Rectal bleeding is often a sign that something is wrong. It needs to be checked by a doctor. Follow these instructions at home: Watch for any changes in your condition. Take these actions to help with bleeding and discomfort:  Eat a diet that is high in fiber. This will keep your poop soft so it is easier for you to poop without pushing too hard. Ask your doctor to tell you what foods and drinks are high in fiber.  Drink enough fluid to keep your pee (urine) clear or pale yellow. This also helps keep your poop soft.  Try taking a warm bath. This may help with pain.  Keep all follow-up visits as told by your doctor. This is important. Get help right away if:  You have new bleeding.  You have more bleeding than before.  You have black or dark red poop.  You throw up (vomit) blood or something that looks like coffee grounds.  You have pain or tenderness in your belly (abdomen).  You have a fever.  You feel weak.  You feel sick to your stomach (nauseous).  You pass out (faint).  You have very bad pain in your butt.  You cannot poop. This information is not intended to replace advice given to you by your health care provider. Make sure you discuss any questions you have with your health care provider. Document Revised: 01/08/2017 Document Reviewed: 03/24/2015 Elsevier Patient Education  2020 Elsevier Inc.  

## 2019-09-06 ENCOUNTER — Other Ambulatory Visit: Payer: Self-pay | Admitting: Family Medicine

## 2019-09-06 DIAGNOSIS — K625 Hemorrhage of anus and rectum: Secondary | ICD-10-CM

## 2019-09-06 LAB — CBC WITH DIFFERENTIAL/PLATELET
Basophils Absolute: 0 10*3/uL (ref 0.0–0.2)
Basos: 1 %
EOS (ABSOLUTE): 0.1 10*3/uL (ref 0.0–0.4)
Eos: 3 %
Hematocrit: 35.2 % — ABNORMAL LOW (ref 37.5–51.0)
Hemoglobin: 11.8 g/dL — ABNORMAL LOW (ref 13.0–17.7)
Immature Grans (Abs): 0 10*3/uL (ref 0.0–0.1)
Immature Granulocytes: 0 %
Lymphocytes Absolute: 2.5 10*3/uL (ref 0.7–3.1)
Lymphs: 57 %
MCH: 32.6 pg (ref 26.6–33.0)
MCHC: 33.5 g/dL (ref 31.5–35.7)
MCV: 97 fL (ref 79–97)
Monocytes Absolute: 0.3 10*3/uL (ref 0.1–0.9)
Monocytes: 6 %
Neutrophils Absolute: 1.5 10*3/uL (ref 1.4–7.0)
Neutrophils: 33 %
Platelets: 284 10*3/uL (ref 150–450)
RBC: 3.62 x10E6/uL — ABNORMAL LOW (ref 4.14–5.80)
RDW: 11.9 % (ref 11.6–15.4)
WBC: 4.4 10*3/uL (ref 3.4–10.8)

## 2019-09-06 LAB — BASIC METABOLIC PANEL
BUN/Creatinine Ratio: 10 (ref 9–20)
BUN: 14 mg/dL (ref 6–24)
CO2: 23 mmol/L (ref 20–29)
Calcium: 9.8 mg/dL (ref 8.7–10.2)
Chloride: 102 mmol/L (ref 96–106)
Creatinine, Ser: 1.42 mg/dL — ABNORMAL HIGH (ref 0.76–1.27)
GFR calc Af Amer: 65 mL/min/{1.73_m2} (ref >59)
GFR calc non Af Amer: 56 mL/min/{1.73_m2} — ABNORMAL LOW (ref >59)
Glucose: 69 mg/dL (ref 65–99)
Potassium: 4.7 mmol/L (ref 3.5–5.2)
Sodium: 137 mmol/L (ref 134–144)

## 2019-09-12 ENCOUNTER — Other Ambulatory Visit: Payer: 59

## 2019-09-13 ENCOUNTER — Other Ambulatory Visit: Payer: Self-pay | Admitting: Family Medicine

## 2019-09-13 ENCOUNTER — Encounter: Payer: Self-pay | Admitting: Family Medicine

## 2019-09-14 ENCOUNTER — Ambulatory Visit: Payer: Self-pay

## 2019-09-14 ENCOUNTER — Other Ambulatory Visit: Payer: Self-pay

## 2019-09-14 ENCOUNTER — Encounter: Payer: Self-pay | Admitting: Family

## 2019-09-14 ENCOUNTER — Other Ambulatory Visit: Payer: Self-pay | Admitting: Pharmacist Clinician (PhC)/ Clinical Pharmacy Specialist

## 2019-09-14 DIAGNOSIS — Z113 Encounter for screening for infections with a predominantly sexual mode of transmission: Secondary | ICD-10-CM

## 2019-09-14 DIAGNOSIS — B20 Human immunodeficiency virus [HIV] disease: Secondary | ICD-10-CM

## 2019-09-15 LAB — T-HELPER CELL (CD4) - (RCID CLINIC ONLY)
CD4 % Helper T Cell: 25 % — ABNORMAL LOW (ref 33–65)
CD4 T Cell Abs: 491 /uL (ref 400–1790)

## 2019-09-16 ENCOUNTER — Other Ambulatory Visit: Payer: Self-pay | Admitting: Family Medicine

## 2019-09-16 DIAGNOSIS — I10 Essential (primary) hypertension: Secondary | ICD-10-CM

## 2019-09-16 MED ORDER — LOSARTAN POTASSIUM 25 MG PO TABS: 25.0000 mg | ORAL_TABLET | Freq: Every day | ORAL | 6 refills | Status: DC

## 2019-09-18 LAB — COMPREHENSIVE METABOLIC PANEL
AG Ratio: 1.3 (calc) (ref 1.0–2.5)
ALT: 25 U/L (ref 9–46)
AST: 16 U/L (ref 10–35)
Albumin: 4.3 g/dL (ref 3.6–5.1)
Alkaline phosphatase (APISO): 57 U/L (ref 35–144)
BUN/Creatinine Ratio: 11 (calc) (ref 6–22)
BUN: 15 mg/dL (ref 7–25)
CO2: 27 mmol/L (ref 20–32)
Calcium: 9.5 mg/dL (ref 8.6–10.3)
Chloride: 107 mmol/L (ref 98–110)
Creat: 1.34 mg/dL — ABNORMAL HIGH (ref 0.70–1.33)
Globulin: 3.2 g/dL (calc) (ref 1.9–3.7)
Glucose, Bld: 101 mg/dL — ABNORMAL HIGH (ref 65–99)
Potassium: 4.4 mmol/L (ref 3.5–5.3)
Sodium: 139 mmol/L (ref 135–146)
Total Bilirubin: 0.4 mg/dL (ref 0.2–1.2)
Total Protein: 7.5 g/dL (ref 6.1–8.1)

## 2019-09-18 LAB — RPR: RPR Ser Ql: NONREACTIVE

## 2019-09-18 LAB — HIV-1 RNA QUANT-NO REFLEX-BLD
HIV 1 RNA Quant: 98 Copies/mL — ABNORMAL HIGH
HIV-1 RNA Quant, Log: 1.99 Log cps/mL — ABNORMAL HIGH

## 2019-09-26 ENCOUNTER — Ambulatory Visit (INDEPENDENT_AMBULATORY_CARE_PROVIDER_SITE_OTHER): Payer: Self-pay | Admitting: Family

## 2019-09-26 ENCOUNTER — Other Ambulatory Visit: Payer: Self-pay

## 2019-09-26 ENCOUNTER — Encounter: Payer: Self-pay | Admitting: Family

## 2019-09-26 VITALS — BP 128/77 | HR 73 | Wt 153.0 lb

## 2019-09-26 DIAGNOSIS — L989 Disorder of the skin and subcutaneous tissue, unspecified: Secondary | ICD-10-CM

## 2019-09-26 DIAGNOSIS — B20 Human immunodeficiency virus [HIV] disease: Secondary | ICD-10-CM

## 2019-09-26 DIAGNOSIS — L988 Other specified disorders of the skin and subcutaneous tissue: Secondary | ICD-10-CM

## 2019-09-26 DIAGNOSIS — F331 Major depressive disorder, recurrent, moderate: Secondary | ICD-10-CM

## 2019-09-26 DIAGNOSIS — Z0000 Encounter for general adult medical examination without abnormal findings: Secondary | ICD-10-CM

## 2019-09-26 MED ORDER — BICTEGRAVIR-EMTRICITAB-TENOFOV 50-200-25 MG PO TABS: 1.0000 | ORAL_TABLET | Freq: Every day | ORAL | 4 refills | Status: DC

## 2019-09-26 NOTE — Assessment & Plan Note (Signed)
Mr. Maimone appears to be doing well with his current regimen of Biktarvy with improved adherence and good tolerance.  No signs/symptoms of opportunistic infection or progressive HIV disease.  Viral load improved now down to 98 from previous.  Discussed importance of continue take medication as prescribed and assistance is offered by this office as well as counseling services if needed.  Continue current dose of Biktarvy.  Plan for follow-up in 1 month or sooner if needed.

## 2019-09-26 NOTE — Assessment & Plan Note (Signed)
Mr. Littler has been having issues with depression that wax and wane and currently in a good state of mind.  No suicidal ideations or signs of psychosis.  Discussed importance of using resources and asking for help including counseling services available here in this office.  He is grateful for the discussion and will take advantage of resources.  No medication necessary at this time.  Continue to monitor.

## 2019-09-26 NOTE — Assessment & Plan Note (Signed)
   Discussed importance of safe sexual practice to reduce risk of STI.  Condoms declined  Covid vaccination up-to-date per recommendations.  Refer to [facility] for routine dental care.

## 2019-09-26 NOTE — Patient Instructions (Signed)
Nice to see you.   We will continue with your Biktarvy.  Refills have been sent the pharmacy.  We have counseling services if you need them.  Follow up with Dr. March Rummage if needed for your foot.   Plan for follow up in 1 months or sooner if needed.

## 2019-09-26 NOTE — Progress Notes (Signed)
Subjective:    Patient ID: Ryan Cruz, male    DOB: 02/11/1965, 54 y.o.   MRN: [id number]  Chief Complaint  Patient presents with  . Follow-up    Still has wart on foot and says podiatrist got rid of it and then it came back and is currently experiencing pain from it     HPI:  Ryan Cruz is a 54 y.o. male with HIV disease who was last seen in the office on 06/05/19 with controlled HIV disease. Viral load at the time was undetectable with CD4 count of 560. Was recently hospitalized and noted to have a viral load of 2,716 and then on 8/5/ at 98.  Recently hospitalized for gastroenteritis and Giardia.  Here today for routine follow-up.  Ryan Cruz has been taking his Biktarvy with improved adherence compared to previous with good tolerance and no adverse side effects.  Overall feeling well today having experienced some depressive symptoms recently.  Denies suicidal ideations.  Is happy that he has regained weight since being sick while in the hospital. Denies fevers, chills, night sweats, headaches, changes in vision, neck pain/stiffness, nausea, diarrhea, vomiting, lesions or rashes.  Ryan Cruz is no problems obtaining his medication from the pharmacy and remains covered through [place].  Continues to smoke marijuana on a daily basis as well as tobacco with alcohol occasional.  Declines condoms today.  Was seen by podiatry to have wart removed and has returned and has questions regarding care of his wart.   Allergies  Allergen Reactions  . Doxycycline Rash      Outpatient Medications Prior to Visit  Medication Sig Dispense Refill  . acetaminophen (TYLENOL) 325 MG tablet Take 650 mg by mouth every 6 (six) hours as needed.    Marland Kitchen amLODipine (NORVASC) 10 MG tablet Take 1 tablet by mouth daily. 30 tablet 6  . Cyanocobalamin (VITAMIN B 12 PO) Take 1 tablet by mouth daily.    Marland Kitchen losartan (COZAAR) 25 MG tablet Take 1 tablet (25 mg total) by mouth daily. 30 tablet 6  . omeprazole (PRILOSEC) 20  MG capsule TAKE 1 CAPSULE(20 MG) BY MOUTH DAILY 30 capsule 0  . ondansetron (ZOFRAN) 4 MG tablet Take 1 tablet (4 mg total) by mouth every 6 (six) hours as needed for nausea. 5 tablet 0  . pseudoephedrine-acetaminophen (TYLENOL SINUS) 30-500 MG TABS tablet Take 1 tablet by mouth every 4 (four) hours as needed (sinus congestion).    . bictegravir-emtricitabine-tenofovir AF (BIKTARVY) 50-200-25 MG TABS tablet Take 1 tablet by mouth daily. 30 tablet 1   No facility-administered medications prior to visit.     Past Medical History:  Diagnosis Date  . Anxiety   . Asthma   . Depression   . HIV (human immunodeficiency virus infection) ([place])   . Hypertension   . Hypoglycemia    sugars drop ion     Past Surgical History:  Procedure Laterality Date  . BIOPSY  08/18/2019   Procedure: BIOPSY;  Surgeon: Jackquline Denmark, MD;  Location: [facility] ENDOSCOPY;  Service: Endoscopy;;  . COLONOSCOPY  02/12/2012   Procedure: COLONOSCOPY;  Surgeon: Leighton Ruff, MD;  Location: [facility] ENDOSCOPY;  Service: Endoscopy;  Laterality: N/A;  . ESOPHAGOGASTRODUODENOSCOPY (EGD) WITH PROPOFOL N/A 08/18/2019   Procedure: ESOPHAGOGASTRODUODENOSCOPY (EGD) WITH PROPOFOL;  Surgeon: Jackquline Denmark, MD;  Location[facility] ENDOSCOPY;  Service: Endoscopy;  Laterality: N/A;  . HERNIA REPAIR     umbilibcal       Review of Systems  Constitutional: Negative for appetite  change, chills, fatigue, fever and unexpected weight change.  Eyes: Negative for visual disturbance.  Respiratory: Negative for cough, chest tightness, shortness of breath and wheezing.   Cardiovascular: Negative for chest pain and leg swelling.  Gastrointestinal: Negative for abdominal pain, constipation, diarrhea, nausea and vomiting.  Genitourinary: Negative for dysuria, flank pain, frequency, genital sores, hematuria and urgency.  Skin: Negative for rash.  Allergic/Immunologic: Negative for immunocompromised state.  Neurological: Negative for dizziness and headaches.    Psychiatric/Behavioral: Positive for dysphoric mood.      Objective:    BP 128/77   Pulse 73   Wt 153 lb (69.4 kg)   SpO2 99%   BMI 23.96 kg/m  Nursing note and vital signs reviewed.  Physical Exam Constitutional:      General: He is not in acute distress.    Appearance: He is well-developed.  Cardiovascular:     Rate and Rhythm: Normal rate and regular rhythm.     Heart sounds: Normal heart sounds.  Pulmonary:     Effort: Pulmonary effort is normal.     Breath sounds: Normal breath sounds.  Skin:    General: Skin is warm and dry.  Neurological:     Mental Status: He is alert and oriented to person, place, and time.  Psychiatric:        Behavior: Behavior normal.        Thought Content: Thought content normal.        Judgment: Judgment normal.      Depression screen [facility] 2/9 09/26/2019 09/05/2019 06/05/2019 02/07/2019 06/02/2018  Decreased Interest 1 2 0 0 0  Down, Depressed, Hopeless 1 2 0 0 0  PHQ - 2 Score 2 4 0 0 0  Altered sleeping - 2 - - -  Tired, decreased energy - 1 - - -  Change in appetite - 0 - - -  Feeling bad or failure about yourself  - 1 - - -  Trouble concentrating - 2 - - -  Moving slowly or fidgety/restless - 2 - - -  Suicidal thoughts - 2 - - -  PHQ-9 Score - 14 - - -  Difficult doing work/chores - - - - -  Some recent data might be hidden       Assessment & Plan:    Patient Active Problem List   Diagnosis Date Noted  . Intractable vomiting with nausea   . Nausea vomiting and diarrhea   . AKI (acute kidney injury) ([place]) 08/14/2019  . Foot lesion 06/05/2019  . Healthcare maintenance 04/26/2018  . Hepatitis B immune 11/18/2016  . Medically noncompliant   . 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 Other headache syndrome   . Optic neuritis 04/16/2016  . Chest pain 11/20/2015  . Hypoglycemia 01/29/2014  . Allergic sinusitis 06/29/2012  . Pilonidal cyst 01/04/2012  . Depression 06/25/2010  .  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 Essential hypertension 12/12/2009  . Ventral hernia 03/28/2009  . NEUROPATHY, NOS 04/30/2008  . DENTAL CARIES 12/14/2007  . TOBACCO USER 06/01/2007  . VERTIGO 05/13/2007  . Human immunodeficiency virus (HIV) disease ([place]) 02/24/2006  . HYPERLIPIDEMIA 02/24/2006  . Rectal bleeding 11/19/2005  . ASTHMA 12/11/1994     Problem List Items Addressed This Visit      Other   Human immunodeficiency virus (HIV) disease ([place])    Ryan Cruz appears to be doing well with his current regimen of Biktarvy with improved adherence and good tolerance.  No signs/symptoms of opportunistic infection or progressive HIV disease.  Viral load improved now down to 98 from previous.  Discussed importance of continue take medication as prescribed and assistance is offered by this office as well as counseling services if needed.  Continue current dose of Biktarvy.  Plan for follow-up in 1 month or sooner if needed.      Relevant Medications   bictegravir-emtricitabine-tenofovir AF (BIKTARVY) 50-200-25 MG TABS tablet   Depression    Ryan Cruz has been having issues with depression that wax and wane and currently in a good state of mind.  No suicidal ideations or signs of psychosis.  Discussed importance of using resources and asking for help including counseling services available here in this office.  He is grateful for the discussion and will take advantage of resources.  No medication necessary at this time.  Continue to monitor.      Healthcare maintenance     Discussed importance of safe sexual practice to reduce risk of STI.  Condoms declined  Covid vaccination up-to-date per recommendations.  Refer to [facility] for routine dental care.       Foot lesion    Ryan Cruz previously had a wart removed by podiatry which has since returned.  Recommend return to podiatry, however he would like to give it an opportunity to clear up on its own with over-the-counter medications.   Continue to monitor as needed.          I am having Center maintain his pseudoephedrine-acetaminophen, Cyanocobalamin (VITAMIN B 12 PO), acetaminophen, ondansetron, amLODipine, omeprazole, losartan, and bictegravir-emtricitabine-tenofovir AF.   Meds ordered this encounter  Medications  . bictegravir-emtricitabine-tenofovir AF (BIKTARVY) 50-200-25 MG TABS tablet    Sig: Take 1 tablet by mouth daily.    Dispense:  30 tablet    Refill:  4    Order Specific Question:   Supervising Provider    Answer:   Carlyle Basques [[id number]]     Follow-up: Return in about 1 month (around 10/27/2019), or if symptoms worsen or fail to improve.   Terri Piedra, MSN, FNP-C Nurse Practitioner [facility] for Infectious Disease [place] number: [phone]

## 2019-09-26 NOTE — Assessment & Plan Note (Signed)
Ryan Cruz previously had a wart removed by podiatry which has since returned.  Recommend return to podiatry, however he would like to give it an opportunity to clear up on its own with over-the-counter medications.  Continue to monitor as needed.

## 2019-10-05 ENCOUNTER — Encounter: Payer: Self-pay | Admitting: Family Medicine

## 2019-10-06 ENCOUNTER — Other Ambulatory Visit: Payer: Self-pay | Admitting: Family Medicine

## 2019-10-06 MED ORDER — OMEPRAZOLE 20 MG PO CPDR: DELAYED_RELEASE_CAPSULE | ORAL | 1 refills | Status: DC

## 2019-10-31 ENCOUNTER — Other Ambulatory Visit: Payer: Self-pay

## 2019-10-31 ENCOUNTER — Other Ambulatory Visit: Payer: Self-pay | Admitting: *Deleted

## 2019-10-31 DIAGNOSIS — B20 Human immunodeficiency virus [HIV] disease: Secondary | ICD-10-CM

## 2019-11-03 LAB — HIV-1 RNA QUANT-NO REFLEX-BLD
HIV 1 RNA Quant: <20 Copies/mL — ABNORMAL HIGH
HIV-1 RNA Quant, Log: <1.3 Log cps/mL — ABNORMAL HIGH

## 2019-11-16 ENCOUNTER — Encounter: Payer: Medicaid Other | Admitting: Family

## 2019-11-21 ENCOUNTER — Encounter: Payer: Self-pay | Admitting: Family

## 2019-11-21 ENCOUNTER — Ambulatory Visit (INDEPENDENT_AMBULATORY_CARE_PROVIDER_SITE_OTHER): Payer: Self-pay | Admitting: Family

## 2019-11-21 ENCOUNTER — Other Ambulatory Visit: Payer: Self-pay

## 2019-11-21 VITALS — BP 133/79 | HR 71 | Temp 99.5°F | Wt 153.0 lb

## 2019-11-21 DIAGNOSIS — Z0000 Encounter for general adult medical examination without abnormal findings: Secondary | ICD-10-CM

## 2019-11-21 DIAGNOSIS — Z23 Encounter for immunization: Secondary | ICD-10-CM

## 2019-11-21 DIAGNOSIS — Z79899 Other long term (current) drug therapy: Secondary | ICD-10-CM

## 2019-11-21 DIAGNOSIS — B20 Human immunodeficiency virus [HIV] disease: Secondary | ICD-10-CM

## 2019-11-21 DIAGNOSIS — Z113 Encounter for screening for infections with a predominantly sexual mode of transmission: Secondary | ICD-10-CM

## 2019-11-21 MED ORDER — BICTEGRAVIR-EMTRICITAB-TENOFOV 50-200-25 MG PO TABS: 1.0000 | ORAL_TABLET | Freq: Every day | ORAL | 4 refills | Status: DC

## 2019-11-21 NOTE — Patient Instructions (Addendum)
Nice to see you.  We will continue your Biktarvy daily.   Refills have been sent to the pharmacy.   Plan for follow up in 3 months or sooner if needed with lab work 1-2 weeks prior to your appointment.   Have a great day and stay safe!

## 2019-11-21 NOTE — Assessment & Plan Note (Signed)
Ryan Cruz continues to have well-controlled HIV disease with good adherence and tolerance to his ART regimen of Biktarvy.  No signs/symptoms of opportunistic infection or progressive HIV disease.  We reviewed lab work and discussed plan of care.  Continue current dose of Biktarvy.  Plan for follow-up in 3 months or sooner if needed with lab work 1 to 2 weeks prior to appointment.

## 2019-11-21 NOTE — Progress Notes (Signed)
Subjective:    Patient ID: Ryan Cruz, male    DOB: 06-11-1965, 54 y.o.   MRN: [id number]  Chief Complaint  Patient presents with  . Follow-up    B20     HPI:  Ryan Cruz is a 54 y.o. male with HIV disease who was last seen in the office on 09/26/2019 with good adherence and tolerance to his ART regimen of Biktarvy.  Viral load at the time was found to be 98 with CD4 count of 491.  Most recent blood work completed on 10/31/2019 with a viral load that is undetectable.  Here today for routine follow-up.  Mr. Brander continues to take his Biktarvy daily as prescribed with no adverse side effects or missed doses since his last office visit.  Overall feeling well today with no new concerns/complaints. Denies fevers, chills, night sweats, headaches, changes in vision, neck pain/stiffness, nausea, diarrhea, vomiting, lesions or rashes.  Mr. Newsome has no problems obtaining his HIV medications from the pharmacy although has noted his losartan to not be covered by financial assistance.  Denies feelings of being down, depressed, or hopeless recently.  He does continue to smoke marijuana daily and a current every day smoker.  Interested in his flu shot today.  Allergies  Allergen Reactions  . Doxycycline Rash    Outpatient Medications Prior to Visit  Medication Sig Dispense Refill  . acetaminophen (TYLENOL) 325 MG tablet Take 650 mg by mouth every 6 (six) hours as needed.    Marland Kitchen amLODipine (NORVASC) 10 MG tablet Take 1 tablet by mouth daily. 30 tablet 6  . Cyanocobalamin (VITAMIN B 12 PO) Take 1 tablet by mouth daily.    Marland Kitchen losartan (COZAAR) 25 MG tablet Take 1 tablet (25 mg total) by mouth daily. 30 tablet 6  . omeprazole (PRILOSEC) 20 MG capsule TAKE 1 CAPSULE(20 MG) BY MOUTH DAILY 30 capsule 1  . ondansetron (ZOFRAN) 4 MG tablet Take 1 tablet (4 mg total) by mouth every 6 (six) hours as needed for nausea. 5 tablet 0  . pseudoephedrine-acetaminophen (TYLENOL SINUS) 30-500 MG TABS tablet Take 1  tablet by mouth every 4 (four) hours as needed (sinus congestion).    . bictegravir-emtricitabine-tenofovir AF (BIKTARVY) 50-200-25 MG TABS tablet Take 1 tablet by mouth daily. 30 tablet 4   No facility-administered medications prior to visit.     Past Medical History:  Diagnosis Date  . Anxiety   . Asthma   . Depression   . HIV (human immunodeficiency virus infection) ([place])   . Hypertension   . Hypoglycemia    sugars drop ion     Past Surgical History:  Procedure Laterality Date  . BIOPSY  08/18/2019   Procedure: BIOPSY;  Surgeon: Jackquline Denmark, MD;  Location: [facility] ENDOSCOPY;  Service: Endoscopy;;  . COLONOSCOPY  02/12/2012   Procedure: COLONOSCOPY;  Surgeon: Leighton Ruff, MD;  Location: [facility] ENDOSCOPY;  Service: Endoscopy;  Laterality: N/A;  . ESOPHAGOGASTRODUODENOSCOPY (EGD) WITH PROPOFOL N/A 08/18/2019   Procedure: ESOPHAGOGASTRODUODENOSCOPY (EGD) WITH PROPOFOL;  Surgeon: Jackquline Denmark, MD;  Location: [facility] ENDOSCOPY;  Service: Endoscopy;  Laterality: N/A;  . HERNIA REPAIR     umbilibcal       Review of Systems  Constitutional: Negative for appetite change, chills, fatigue, fever and unexpected weight change.  Eyes: Negative for visual disturbance.  Respiratory: Negative for cough, chest tightness, shortness of breath and wheezing.   Cardiovascular: Negative for chest pain and leg swelling.  Gastrointestinal: Negative for abdominal pain, constipation, diarrhea, nausea  and vomiting.  Genitourinary: Negative for dysuria, flank pain, frequency, genital sores, hematuria and urgency.  Skin: Negative for rash.  Allergic/Immunologic: Negative for immunocompromised state.  Neurological: Negative for dizziness and headaches.      Objective:    BP 133/79   Pulse 71   Temp 99.5 F (37.5 C) (Oral)   Wt 153 lb (69.4 kg)   BMI 23.96 kg/m  Nursing note and vital signs reviewed.  Physical Exam Constitutional:      General: He is not in acute distress.    Appearance: He is  well-developed.  Eyes:     Conjunctiva/sclera: Conjunctivae normal.  Cardiovascular:     Rate and Rhythm: Normal rate and regular rhythm.     Heart sounds: Normal heart sounds. No murmur heard.  No friction rub. No gallop.   Pulmonary:     Effort: Pulmonary effort is normal. No respiratory distress.     Breath sounds: Normal breath sounds. No wheezing or rales.  Chest:     Chest wall: No tenderness.  Abdominal:     General: Bowel sounds are normal.     Palpations: Abdomen is soft.     Tenderness: There is no abdominal tenderness.  Musculoskeletal:     Cervical back: Neck supple.  Lymphadenopathy:     Cervical: No cervical adenopathy.  Skin:    General: Skin is warm and dry.     Findings: No rash.  Neurological:     Mental Status: He is alert and oriented to person, place, and time.  Psychiatric:        Behavior: Behavior normal.        Thought Content: Thought content normal.        Judgment: Judgment normal.      Depression screen [facility] 2/9 11/21/2019 09/26/2019 09/05/2019 06/05/2019 02/07/2019  Decreased Interest 1 1 2  0 0  Down, Depressed, Hopeless 1 1 2  0 0  PHQ - 2 Score 2 2 4  0 0  Altered sleeping 0 - 2 - -  Tired, decreased energy 0 - 1 - -  Change in appetite 1 - 0 - -  Feeling bad or failure about yourself  0 - 1 - -  Trouble concentrating 1 - 2 - -  Moving slowly or fidgety/restless 0 - 2 - -  Suicidal thoughts 0 - 2 - -  PHQ-9 Score 4 - 14 - -  Difficult doing work/chores Somewhat difficult - - - -  Some recent data might be hidden       Assessment & Plan:    Patient Active Problem List   Diagnosis Date Noted  . Intractable vomiting with nausea   . Nausea vomiting and diarrhea   . AKI (acute kidney injury) ([place]) 08/14/2019  . Foot lesion 06/05/2019  . Healthcare maintenance 04/26/2018  . Hepatitis B immune 11/18/2016  . Medically noncompliant   . 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 Other headache syndrome    . Optic neuritis 04/16/2016  . Chest pain 11/20/2015  . Hypoglycemia 01/29/2014  . Allergic sinusitis 06/29/2012  . Pilonidal cyst 01/04/2012  . Depression 06/25/2010  . 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 Essential hypertension 12/12/2009  . Ventral hernia 03/28/2009  . NEUROPATHY, NOS 04/30/2008  . DENTAL CARIES 12/14/2007  . TOBACCO USER 06/01/2007  . VERTIGO 05/13/2007  . Human immunodeficiency virus (HIV) disease ([place]) 02/24/2006  . HYPERLIPIDEMIA 02/24/2006  . Rectal bleeding 11/19/2005  . ASTHMA 12/11/1994  Problem List Items Addressed This Visit      Other   Human immunodeficiency virus (HIV) disease ([place])    Mr. Autry continues to have well-controlled HIV disease with good adherence and tolerance to his ART regimen of Biktarvy.  No signs/symptoms of opportunistic infection or progressive HIV disease.  We reviewed lab work and discussed plan of care.  Continue current dose of Biktarvy.  Plan for follow-up in 3 months or sooner if needed with lab work 1 to 2 weeks prior to appointment.      Relevant Medications   bictegravir-emtricitabine-tenofovir AF (BIKTARVY) 50-200-25 MG TABS tablet   Other Relevant Orders   HIV-1 RNA quant-no reflex-bld   T-helper cell (CD4)- ([facility] clinic only)   Comprehensive metabolic panel   Healthcare maintenance     Discussed importance of safe sexual practice to reduce risk of STI.  Condoms declined.  Due for routine dental care.  Referral placed.  Influenza updated today.       Other Visit Diagnoses    Need for immunization against influenza    -  Primary   Relevant Orders   Flu Vaccine QUAD 36+ mos IM (Completed)   Screening for STDs (sexually transmitted diseases)       Relevant Orders   RPR   Pharmacologic therapy       Relevant Orders   Lipid panel       I am having Shaden E. Balthazar maintain his pseudoephedrine-acetaminophen, Cyanocobalamin (VITAMIN B 12 PO), acetaminophen, ondansetron, amLODipine, losartan,  omeprazole, and bictegravir-emtricitabine-tenofovir AF.   Meds ordered this encounter  Medications  . bictegravir-emtricitabine-tenofovir AF (BIKTARVY) 50-200-25 MG TABS tablet    Sig: Take 1 tablet by mouth daily.    Dispense:  30 tablet    Refill:  4    Order Specific Question:   Supervising Provider    Answer:   Carlyle Basques [[id number]]     Follow-up: Return in about 3 months (around 02/21/2020), or if symptoms worsen or fail to improve.   Terri Piedra, MSN, FNP-C Nurse Practitioner [facility] for Infectious Disease [place] number: [phone]

## 2019-11-21 NOTE — Assessment & Plan Note (Signed)
   Discussed importance of safe sexual practice to reduce risk of STI.  Condoms declined.  Due for routine dental care.  Referral placed.  Influenza updated today.

## 2019-11-22 ENCOUNTER — Telehealth: Payer: Self-pay | Admitting: Family Medicine

## 2019-11-22 NOTE — Telephone Encounter (Signed)
I received a message from this patient's infectious disease specialist stating his losartan was not covered by insurance but rather Benicar and Diovan recovered.  Can you please look into this and let me know so I can prescribe the alternative?  Thank you

## 2019-11-23 NOTE — Telephone Encounter (Signed)
Call placed to patient's pharmacy but call could not be completed. Claiborne Billings and I looked up his insurance in our system and his rx insurance is not active. I'm not sure how the other provider was getting the covered alternative message about Benicar or Diovan. However, with his inactive insurance we can fill his losartan here. I attempted to contact the patient but he did not answer. I left him a VM with instructions to call back so I could discuss this with him.

## 2019-12-22 ENCOUNTER — Encounter: Payer: Self-pay | Admitting: Family Medicine

## 2020-01-22 ENCOUNTER — Encounter: Payer: Self-pay | Admitting: Family Medicine

## 2020-02-19 ENCOUNTER — Other Ambulatory Visit: Payer: Medicaid Other

## 2020-03-04 ENCOUNTER — Encounter: Payer: Medicaid Other | Admitting: Family

## 2020-03-11 ENCOUNTER — Other Ambulatory Visit: Payer: Self-pay

## 2020-03-11 ENCOUNTER — Encounter: Payer: Self-pay | Admitting: Family

## 2020-03-11 ENCOUNTER — Ambulatory Visit (INDEPENDENT_AMBULATORY_CARE_PROVIDER_SITE_OTHER): Payer: Medicaid Other | Admitting: Family

## 2020-03-11 VITALS — BP 145/78 | HR 66 | Temp 98.3°F | Wt 147.2 lb

## 2020-03-11 DIAGNOSIS — Z0000 Encounter for general adult medical examination without abnormal findings: Secondary | ICD-10-CM

## 2020-03-11 DIAGNOSIS — Z113 Encounter for screening for infections with a predominantly sexual mode of transmission: Secondary | ICD-10-CM | POA: Diagnosis present

## 2020-03-11 DIAGNOSIS — R200 Anesthesia of skin: Secondary | ICD-10-CM | POA: Diagnosis not present

## 2020-03-11 DIAGNOSIS — B20 Human immunodeficiency virus [HIV] disease: Secondary | ICD-10-CM

## 2020-03-11 MED ORDER — BICTEGRAVIR-EMTRICITAB-TENOFOV 50-200-25 MG PO TABS: 1.0000 | ORAL_TABLET | Freq: Every day | ORAL | 4 refills | Status: DC

## 2020-03-11 NOTE — Assessment & Plan Note (Signed)
   Discussed importance of safe sexual practice to reduce risk of STI.  Condoms declined.  Due for Covid booster will be scheduled.  Due for routine dental care with information placed in after visit summary

## 2020-03-11 NOTE — Assessment & Plan Note (Signed)
Ryan Cruz continues to have well-controlled HIV disease with good adherence and tolerance to his ART regimen of Biktarvy.  No signs/symptoms of opportunistic infection.  Unlikely numbness/tingling related to HIV although possible.  We reviewed previous lab work and discussed plan of care.  Check blood work today.  Continue current dose of Biktarvy.  He will need to renew financial assistance.  Plan for follow-up in 3 months or sooner if needed

## 2020-03-11 NOTE — Progress Notes (Signed)
Subjective:    Patient ID: Ryan Cruz, male    DOB: 1965/04/20, 55 y.o.   MRN: [id number]  Chief Complaint  Patient presents with  . Follow-up    Ear drainage  numbness in left legs.     HPI:  Ryan Cruz is a 55 y.o. male with HIV disease last seen on 11/21/2019 with well-controlled virus and good adherence and tolerance to his ART regimen of Biktarvy.  Viral load at the time was undetectable with CD4 count of 491.  Here today for routine follow-up.  Ryan Cruz has been taking his Biktarvy daily as prescribed with no adverse side effects or missed doses since his last office visit.  Overall feeling well today with concerns for new onset numbness/tingling of his bilateral feet with the left greater than right that has been going on since approximately December 12 when he first noted it.  He did experience a fall for which she contacted primary care with the recommendation for going to emergency room and did not follow through.  He currently has numbness and tingling primarily on the anterior medial aspect of his left foot and great toe with some of his right side as well.  No work-up has been done to this point.  He does have occasional left-sided back pain.  No current excessive hunger, thirst, or urination.  Denies changes to bowel or bladder habits or saddle anesthesia. Denies fevers, chills, night sweats, headaches, changes in vision, neck pain/stiffness, nausea, diarrhea, vomiting, lesions or rashes.  Ryan Cruz has no problems obtaining his medication from the pharmacy and remains covered through [facility] and is in need of renewing his financial assistance.  Marijuana and tobacco use are some very with no current alcohol consumption.  Denies feelings of being down, depressed, or hopeless but has wanted to keep away from everyone at times.  Denies suicidal ideations.  Not interested in counseling.  Has received his Moderna vaccination and is due for booster. Due for routine dental care.     Allergies  Allergen Reactions  . Doxycycline Rash      Outpatient Medications Prior to Visit  Medication Sig Dispense Refill  . acetaminophen (TYLENOL) 325 MG tablet Take 650 mg by mouth every 6 (six) hours as needed.    Marland Kitchen amLODipine (NORVASC) 10 MG tablet Take 1 tablet by mouth daily. 30 tablet 6  . Cyanocobalamin (VITAMIN B 12 PO) Take 1 tablet by mouth daily.    Marland Kitchen losartan (COZAAR) 25 MG tablet Take 1 tablet (25 mg total) by mouth daily. 30 tablet 6  . omeprazole (PRILOSEC) 20 MG capsule TAKE 1 CAPSULE(20 MG) BY MOUTH DAILY 30 capsule 1  . ondansetron (ZOFRAN) 4 MG tablet Take 1 tablet (4 mg total) by mouth every 6 (six) hours as needed for nausea. 5 tablet 0  . pseudoephedrine-acetaminophen (TYLENOL SINUS) 30-500 MG TABS tablet Take 1 tablet by mouth every 4 (four) hours as needed (sinus congestion).    . bictegravir-emtricitabine-tenofovir AF (BIKTARVY) 50-200-25 MG TABS tablet Take 1 tablet by mouth daily. 30 tablet 4   No facility-administered medications prior to visit.     Past Medical History:  Diagnosis Date  . Anxiety   . Asthma   . Depression   . HIV (human immunodeficiency virus infection) ([place])   . Hypertension   . Hypoglycemia    sugars drop ion     Past Surgical History:  Procedure Laterality Date  . BIOPSY  08/18/2019   Procedure: BIOPSY;  Surgeon:  Jackquline Denmark, MD;  Location[facility] ENDOSCOPY;  Service: Endoscopy;;  . COLONOSCOPY  02/12/2012   Procedure: COLONOSCOPY;  Surgeon: Leighton Ruff, MD;  Location: [facility] ENDOSCOPY;  Service: Endoscopy;  Laterality: N/A;  . ESOPHAGOGASTRODUODENOSCOPY (EGD) WITH PROPOFOL N/A 08/18/2019   Procedure: ESOPHAGOGASTRODUODENOSCOPY (EGD) WITH PROPOFOL;  Surgeon: Jackquline Denmark, MD;  Location: [facility] ENDOSCOPY;  Service: Endoscopy;  Laterality: N/A;  . HERNIA REPAIR     umbilibcal       Review of Systems  Constitutional: Negative for appetite change, chills, fatigue, fever and unexpected weight change.  Eyes: Negative for  visual disturbance.  Respiratory: Negative for cough, chest tightness, shortness of breath and wheezing.   Cardiovascular: Negative for chest pain and leg swelling.  Gastrointestinal: Negative for abdominal pain, constipation, diarrhea, nausea and vomiting.  Genitourinary: Negative for dysuria, flank pain, frequency, genital sores, hematuria and urgency.  Skin: Negative for rash.  Allergic/Immunologic: Negative for immunocompromised state.  Neurological: Positive for numbness. Negative for dizziness and headaches.      Objective:    BP (!) 145/78   Pulse 66   Temp 98.3 F (36.8 C) (Oral)   Wt 147 lb 3.2 oz (66.8 kg)   BMI 23.05 kg/m  Nursing note and vital signs reviewed.  Physical Exam Constitutional:      General: He is not in acute distress.    Appearance: He is well-developed.  HENT:     Mouth/Throat:     Mouth: Oropharynx is clear and moist.  Eyes:     Conjunctiva/sclera: Conjunctivae normal.  Cardiovascular:     Rate and Rhythm: Normal rate and regular rhythm.     Pulses: Intact distal pulses.     Heart sounds: Normal heart sounds. No murmur heard. No friction rub. No gallop.   Pulmonary:     Effort: Pulmonary effort is normal. No respiratory distress.     Breath sounds: Normal breath sounds. No wheezing or rales.  Chest:     Chest wall: No tenderness.  Abdominal:     General: Bowel sounds are normal.     Palpations: Abdomen is soft.     Tenderness: There is no abdominal tenderness.  Musculoskeletal:     Cervical back: Neck supple.  Lymphadenopathy:     Cervical: No cervical adenopathy.  Skin:    General: Skin is warm and dry.     Findings: No rash.  Neurological:     Mental Status: He is alert and oriented to person, place, and time.  Psychiatric:        Mood and Affect: Mood and affect normal.        Behavior: Behavior normal.        Thought Content: Thought content normal.        Judgment: Judgment normal.      Depression screen [facility] 2/9  11/21/2019 09/26/2019 09/05/2019 06/05/2019 02/07/2019  Decreased Interest 1 1 2  0 0  Down, Depressed, Hopeless 1 1 2  0 0  PHQ - 2 Score 2 2 4  0 0  Altered sleeping 0 - 2 - -  Tired, decreased energy 0 - 1 - -  Change in appetite 1 - 0 - -  Feeling bad or failure about yourself  0 - 1 - -  Trouble concentrating 1 - 2 - -  Moving slowly or fidgety/restless 0 - 2 - -  Suicidal thoughts 0 - 2 - -  PHQ-9 Score 4 - 14 - -  Difficult doing work/chores Somewhat difficult - - - -  Some  recent data might be hidden       Assessment & Plan:    Patient Active Problem List   Diagnosis Date Noted  . Numbness of feet 03/11/2020  . Intractable vomiting with nausea   . Nausea vomiting and diarrhea   . AKI (acute kidney injury) ([place]) 08/14/2019  . Foot lesion 06/05/2019  . Healthcare maintenance 04/26/2018  . Hepatitis B immune 11/18/2016  . Medically noncompliant   . 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 Other headache syndrome   . Optic neuritis 04/16/2016  . Chest pain 11/20/2015  . Hypoglycemia 01/29/2014  . Allergic sinusitis 06/29/2012  . Pilonidal cyst 01/04/2012  . Depression 06/25/2010  . 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 Essential hypertension 12/12/2009  . Ventral hernia 03/28/2009  . NEUROPATHY, NOS 04/30/2008  . DENTAL CARIES 12/14/2007  . TOBACCO USER 06/01/2007  . VERTIGO 05/13/2007  . Human immunodeficiency virus (HIV) disease ([place]) 02/24/2006  . HYPERLIPIDEMIA 02/24/2006  . Rectal bleeding 11/19/2005  . ASTHMA 12/11/1994     Problem List Items Addressed This Visit      Other   Human immunodeficiency virus (HIV) disease ([place]) - Primary    Mr. Pruden continues to have well-controlled HIV disease with good adherence and tolerance to his ART regimen of Biktarvy.  No signs/symptoms of opportunistic infection.  Unlikely numbness/tingling related to HIV although possible.  We reviewed previous lab work and discussed plan of care.   Check blood work today.  Continue current dose of Biktarvy.  He will need to renew financial assistance.  Plan for follow-up in 3 months or sooner if needed      Relevant Medications   bictegravir-emtricitabine-tenofovir AF (BIKTARVY) 50-200-25 MG TABS tablet   Other Relevant Orders   HIV-1 RNA quant-no reflex-bld   T-helper cell (CD4)- ([facility] clinic only)   COMPLETE METABOLIC PANEL WITH GFR   Healthcare maintenance     Discussed importance of safe sexual practice to reduce risk of STI.  Condoms declined.  Due for Covid booster will be scheduled.  Due for routine dental care with information placed in after visit summary      Numbness of feet    Mr. Butch has new numbness of his bilateral feet greater on the left than on the right of unclear origin.  Does not appear to be associated with any trauma although did experience a fall.  Discussed possible origins including HIV, back pain, metabolic or other nerve related pathology.  Check hemoglobin A1c today to rule out diabetes.  May need additional work-up including nerve conduction study and/or imaging of the lumbar spine as it appears to be around the L4 nerve root.  We will follow up once A1c results are available and may require additional evaluation through primary care and/or orthopedics/neurology.      Relevant Orders   HgB A1c    Other Visit Diagnoses    Screening for STDs (sexually transmitted diseases)       Relevant Orders   RPR       I am having Adrain E. Devenport maintain his pseudoephedrine-acetaminophen, Cyanocobalamin (VITAMIN B 12 PO), acetaminophen, ondansetron, amLODipine, losartan, omeprazole, and bictegravir-emtricitabine-tenofovir AF.   Meds ordered this encounter  Medications  . bictegravir-emtricitabine-tenofovir AF (BIKTARVY) 50-200-25 MG TABS tablet    Sig: Take 1 tablet by mouth daily.    Dispense:  30 tablet    Refill:  4    Order Specific Question:   Supervising Provider  Answer:   Carlyle Basques  [[id number]]     Follow-up: Return in about 3 months (around 06/08/2020).   Terri Piedra, MSN, FNP-C Nurse Practitioner [facility] for Infectious Disease [place] number: [phone]

## 2020-03-11 NOTE — Patient Instructions (Addendum)
Nice to see you.  We will check your lab work today.  Continue to take your [place] daily as prescribed.  Refills have been sent to the pharmacy.   May need to have additional work up with primary care for the numbness and tingling including back evaluation, B12, and/or nerve conduction. We will check your A1c today.   Plan for follow up 3 months or sooner if needed.   Schedule a time to renew financial assistance.

## 2020-03-11 NOTE — Assessment & Plan Note (Signed)
Mr. Shonka has new numbness of his bilateral feet greater on the left than on the right of unclear origin.  Does not appear to be associated with any trauma although did experience a fall.  Discussed possible origins including HIV, back pain, metabolic or other nerve related pathology.  Check hemoglobin A1c today to rule out diabetes.  May need additional work-up including nerve conduction study and/or imaging of the lumbar spine as it appears to be around the L4 nerve root.  We will follow up once A1c results are available and may require additional evaluation through primary care and/or orthopedics/neurology.

## 2020-03-12 LAB — T-HELPER CELL (CD4) - (RCID CLINIC ONLY)
CD4 % Helper T Cell: 28 % — ABNORMAL LOW (ref 33–65)
CD4 T Cell Abs: 509 /uL (ref 400–1790)

## 2020-03-14 LAB — COMPLETE METABOLIC PANEL WITH GFR
AG Ratio: 1.4 (calc) (ref 1.0–2.5)
ALT: 15 U/L (ref 9–46)
AST: 12 U/L (ref 10–35)
Albumin: 4.6 g/dL (ref 3.6–5.1)
Alkaline phosphatase (APISO): 58 U/L (ref 35–144)
BUN: 15 mg/dL (ref 7–25)
CO2: 25 mmol/L (ref 20–32)
Calcium: 9.9 mg/dL (ref 8.6–10.3)
Chloride: 106 mmol/L (ref 98–110)
Creat: 1.29 mg/dL (ref 0.70–1.33)
GFR, Est African American: 73 mL/min/{1.73_m2} (ref >=60)
GFR, Est Non African American: 63 mL/min/{1.73_m2} (ref >=60)
Globulin: 3.2 g/dL (calc) (ref 1.9–3.7)
Glucose, Bld: 66 mg/dL (ref 65–99)
Potassium: 4.3 mmol/L (ref 3.5–5.3)
Sodium: 141 mmol/L (ref 135–146)
Total Bilirubin: 0.7 mg/dL (ref 0.2–1.2)
Total Protein: 7.8 g/dL (ref 6.1–8.1)

## 2020-03-14 LAB — HEMOGLOBIN A1C
Hgb A1c MFr Bld: 5.4 % of total Hgb (ref <5.7)
Mean Plasma Glucose: 108 mg/dL
eAG (mmol/L): 6 mmol/L

## 2020-03-14 LAB — HIV-1 RNA QUANT-NO REFLEX-BLD
HIV 1 RNA Quant: <20 Copies/mL
HIV-1 RNA Quant, Log: <1.3 Log cps/mL

## 2020-03-14 LAB — RPR: RPR Ser Ql: NONREACTIVE

## 2020-03-15 ENCOUNTER — Encounter: Payer: Self-pay | Admitting: Family

## 2020-03-15 ENCOUNTER — Ambulatory Visit (INDEPENDENT_AMBULATORY_CARE_PROVIDER_SITE_OTHER): Payer: Self-pay

## 2020-03-15 ENCOUNTER — Other Ambulatory Visit: Payer: Self-pay

## 2020-03-15 ENCOUNTER — Other Ambulatory Visit: Payer: Self-pay | Admitting: *Deleted

## 2020-03-15 ENCOUNTER — Ambulatory Visit (INDEPENDENT_AMBULATORY_CARE_PROVIDER_SITE_OTHER): Payer: Medicaid Other | Admitting: Family

## 2020-03-15 ENCOUNTER — Ambulatory Visit: Payer: Medicaid Other

## 2020-03-15 DIAGNOSIS — H60312 Diffuse otitis externa, left ear: Secondary | ICD-10-CM | POA: Insufficient documentation

## 2020-03-15 DIAGNOSIS — B20 Human immunodeficiency virus [HIV] disease: Secondary | ICD-10-CM

## 2020-03-15 DIAGNOSIS — Z113 Encounter for screening for infections with a predominantly sexual mode of transmission: Secondary | ICD-10-CM

## 2020-03-15 DIAGNOSIS — Z23 Encounter for immunization: Secondary | ICD-10-CM

## 2020-03-15 MED ORDER — CORTISPORIN-TC 3.3-3-10-0.5 MG/ML OT SUSP: 3.0000 [drp] | Freq: Four times a day (QID) | OTIC | 0 refills | Status: DC

## 2020-03-15 MED ORDER — CIPROFLOXACIN HCL 500 MG PO TABS: 500.0000 mg | ORAL_TABLET | Freq: Two times a day (BID) | ORAL | 0 refills | Status: DC

## 2020-03-15 MED ORDER — NEOMYCIN-POLYMYXIN-HC 3.5-10000-1 OT SOLN: 3.0000 [drp] | Freq: Four times a day (QID) | OTIC | 0 refills | Status: DC

## 2020-03-15 NOTE — Patient Instructions (Signed)
Nice to see you.  We will get you started on Cirpofloxacin and Cortisporin.   Use Debrox or other ear cleansing for ear wax removal.  Follow up if symptoms worsen or do not improve.   Have a great day.

## 2020-03-15 NOTE — Progress Notes (Signed)
   Covid-19 Vaccination Clinic  Name:  Ryan Cruz    MRN: [id number] DOB: [birth year]  03/15/2020  [name] was observed post Covid-19 immunization for 15 minutes without incident. He was provided with Vaccine Information Sheet and instruction to access the V-Safe system.   [name] was instructed to call 911 with any severe reactions post vaccine: Marland Kitchen Difficulty breathing  . Swelling of face and throat  . A fast heartbeat  . A bad rash all over body  . Dizziness and weakness   Immunizations Administered    Name Date Dose VIS Date Route   PFIZER Comrnaty(Gray TOP) Covid-19 Vaccine 03/15/2020  9:26 AM 0.3 mL 01/18/2020 Intramuscular   Manufacturer: [employer]   Lot: [id number]   NDC: [phone]    Landis Gandy, RN

## 2020-03-15 NOTE — Assessment & Plan Note (Signed)
Ryan Cruz has signs/symptoms consistent with acute otitis externa. Will start cortisporin and given HIV status will also start Ciprofloxacin orally. Advised to follow up if symptoms worsen or do not improve.

## 2020-03-15 NOTE — Progress Notes (Signed)
Subjective:    Patient ID: Ryan Cruz, male    DOB: [birth year], 55 y.o.   MRN: [id number]  No chief complaint on file.    HPI:  Ryan Cruz is a 55 y.o. male with HIV disease last seen on 1/31 for routine care and here today for acute office visit.   Ryan Cruz was seen today for a scheduled appointment to receive his COVID-19 vaccination when nursing staff noted that he had drainage in his left ear that has going on for about 2 weeks. Has decreased hearing out of the left ear. There was some drainage out of the right ear. Notes wax and drainage in his ear every morning. No fevers or chills. No attempted treatments. Symptoms have stayed the same with no improvement or worsening recently.    Allergies  Allergen Reactions  . Doxycycline Rash      Outpatient Medications Prior to Visit  Medication Sig Dispense Refill  . acetaminophen (TYLENOL) 325 MG tablet Take 650 mg by mouth every 6 (six) hours as needed.    Marland Kitchen amLODipine (NORVASC) 10 MG tablet Take 1 tablet by mouth daily. 30 tablet 6  . bictegravir-emtricitabine-tenofovir AF (BIKTARVY) 50-200-25 MG TABS tablet Take 1 tablet by mouth daily. 30 tablet 4  . Cyanocobalamin (VITAMIN B 12 PO) Take 1 tablet by mouth daily.    Marland Kitchen losartan (COZAAR) 25 MG tablet Take 1 tablet (25 mg total) by mouth daily. 30 tablet 6  . omeprazole (PRILOSEC) 20 MG capsule TAKE 1 CAPSULE(20 MG) BY MOUTH DAILY 30 capsule 1  . ondansetron (ZOFRAN) 4 MG tablet Take 1 tablet (4 mg total) by mouth every 6 (six) hours as needed for nausea. 5 tablet 0  . pseudoephedrine-acetaminophen (TYLENOL SINUS) 30-500 MG TABS tablet Take 1 tablet by mouth every 4 (four) hours as needed (sinus congestion).     No facility-administered medications prior to visit.     Past Medical History:  Diagnosis Date  . Anxiety   . Asthma   . Depression   . HIV (human immunodeficiency virus infection) ([place])   . Hypertension   . Hypoglycemia    sugars drop ion     Past  Surgical History:  Procedure Laterality Date  . BIOPSY  08/18/2019   Procedure: BIOPSY;  Surgeon: Jackquline Denmark, MD;  Location: [facility] ENDOSCOPY;  Service: Endoscopy;;  . COLONOSCOPY  02/12/2012   Procedure: COLONOSCOPY;  Surgeon: Leighton Ruff, MD;  Location: [facility] ENDOSCOPY;  Service: Endoscopy;  Laterality: N/A;  . ESOPHAGOGASTRODUODENOSCOPY (EGD) WITH PROPOFOL N/A 08/18/2019   Procedure: ESOPHAGOGASTRODUODENOSCOPY (EGD) WITH PROPOFOL;  Surgeon: Jackquline Denmark, MD;  Location: [facility] ENDOSCOPY;  Service: Endoscopy;  Laterality: N/A;  . HERNIA REPAIR     umbilibcal       Review of Systems  Constitutional: Negative for chills, diaphoresis, fatigue and fever.  HENT: Positive for ear discharge.   Respiratory: Negative for cough, chest tightness, shortness of breath and wheezing.   Cardiovascular: Negative for chest pain.  Gastrointestinal: Negative for abdominal pain, diarrhea, nausea and vomiting.      Objective:    There were no vitals taken for this visit. Nursing note and vital signs reviewed.  Physical Exam Constitutional:      General: He is not in acute distress.    Appearance: He is well-developed.  HENT:     Right Ear: External ear normal. No decreased hearing noted. No drainage or swelling. There is impacted cerumen.     Left Ear: External ear normal. Decreased  hearing noted. Drainage present. No swelling or tenderness. No foreign body. Tympanic membrane is not perforated.     Mouth/Throat:     Mouth: Oropharynx is clear and moist.  Eyes:     Conjunctiva/sclera: Conjunctivae normal.  Cardiovascular:     Rate and Rhythm: Normal rate and regular rhythm.     Pulses: Intact distal pulses.     Heart sounds: Normal heart sounds. No murmur heard. No friction rub. No gallop.   Pulmonary:     Effort: Pulmonary effort is normal. No respiratory distress.     Breath sounds: Normal breath sounds. No wheezing or rales.  Chest:     Chest wall: No tenderness.  Abdominal:     General: Bowel  sounds are normal.     Palpations: Abdomen is soft.     Tenderness: There is no abdominal tenderness.  Musculoskeletal:     Cervical back: Neck supple.  Lymphadenopathy:     Cervical: No cervical adenopathy.  Skin:    General: Skin is warm and dry.     Findings: No rash.  Neurological:     Mental Status: He is alert and oriented to person, place, and time.  Psychiatric:        Mood and Affect: Mood and affect normal.        Behavior: Behavior normal.        Thought Content: Thought content normal.        Judgment: Judgment normal.      Depression screen [facility] 2/9 11/21/2019 09/26/2019 09/05/2019 06/05/2019 02/07/2019  Decreased Interest 1 1 2  0 0  Down, Depressed, Hopeless 1 1 2  0 0  PHQ - 2 Score 2 2 4  0 0  Altered sleeping 0 - 2 - -  Tired, decreased energy 0 - 1 - -  Change in appetite 1 - 0 - -  Feeling bad or failure about yourself  0 - 1 - -  Trouble concentrating 1 - 2 - -  Moving slowly or fidgety/restless 0 - 2 - -  Suicidal thoughts 0 - 2 - -  PHQ-9 Score 4 - 14 - -  Difficult doing work/chores Somewhat difficult - - - -  Some recent data might be hidden       Assessment & Plan:    Patient Active Problem List   Diagnosis Date Noted  . Acute diffuse otitis externa of left ear 03/15/2020  . Numbness of feet 03/11/2020  . Intractable vomiting with nausea   . Nausea vomiting and diarrhea   . AKI (acute kidney injury) ([place]) 08/14/2019  . Foot lesion 06/05/2019  . Healthcare maintenance 04/26/2018  . Hepatitis B immune 11/18/2016  . Medically noncompliant   . 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 Other headache syndrome   . Optic neuritis 04/16/2016  . Chest pain 11/20/2015  . Hypoglycemia 01/29/2014  . Allergic sinusitis 06/29/2012  . Pilonidal cyst 01/04/2012  . Depression 06/25/2010  . 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 Essential hypertension 12/12/2009  . Ventral hernia 03/28/2009  . NEUROPATHY, NOS 04/30/2008   . DENTAL CARIES 12/14/2007  . TOBACCO USER 06/01/2007  . VERTIGO 05/13/2007  . Human immunodeficiency virus (HIV) disease ([place]) 02/24/2006  . HYPERLIPIDEMIA 02/24/2006  . Rectal bleeding 11/19/2005  . ASTHMA 12/11/1994     Problem List Items Addressed This Visit      Nervous and Auditory   Acute diffuse otitis externa of left ear - Primary  Mr. Heimbuch has signs/symptoms consistent with acute otitis externa. Will start cortisporin and given HIV status will also start Ciprofloxacin orally. Advised to follow up if symptoms worsen or do not improve.           I have discontinued Carley E. Wivell's Cortisporin-TC. I am also having him start on ciprofloxacin and neomycin-polymyxin-hydrocortisone. Additionally, I am having him maintain his pseudoephedrine-acetaminophen, Cyanocobalamin (VITAMIN B 12 PO), acetaminophen, ondansetron, amLODipine, losartan, omeprazole, and bictegravir-emtricitabine-tenofovir AF.   Meds ordered this encounter  Medications  . DISCONTD: neomycin-colistin-hydrocortisone-thonzonium (CORTISPORIN-TC) 3.04-11-08-0.5 MG/ML OTIC suspension    Sig: Place 3 drops into the left ear 4 (four) times daily.    Dispense:  10 mL    Refill:  0    Order Specific Question:   Supervising Provider    Answer:   Carlyle Basques [[id number]]  . ciprofloxacin (CIPRO) 500 MG tablet    Sig: Take 1 tablet (500 mg total) by mouth 2 (two) times daily.    Dispense:  14 tablet    Refill:  0    Order Specific Question:   Supervising Provider    Answer:   Carlyle Basques [[id number]]  . neomycin-polymyxin-hydrocortisone (CORTISPORIN) OTIC solution    Sig: Place 3 drops into the left ear 4 (four) times daily.    Dispense:  10 mL    Refill:  0    Please cancel previous cortisporin prescription    Order Specific Question:   Supervising Provider    Answer:   Carlyle Basques [[id number]]     Follow-up: If symptoms worsen or do not improve.   Terri Piedra, MSN, FNP-C Nurse Practitioner [facility] [place] number: [phone]

## 2020-04-02 ENCOUNTER — Other Ambulatory Visit: Payer: Self-pay | Admitting: Family Medicine

## 2020-04-02 DIAGNOSIS — I10 Essential (primary) hypertension: Secondary | ICD-10-CM

## 2020-04-21 ENCOUNTER — Inpatient Hospital Stay (HOSPITAL_COMMUNITY)
Admission: EM | Admit: 2020-04-21 | Discharge: 2020-04-24 | DRG: 372 | Disposition: A | Discharge status: Home / Self Care | Payer: Medicaid Other | Attending: Family Medicine | Admitting: Family Medicine

## 2020-04-21 ENCOUNTER — Encounter (HOSPITAL_COMMUNITY): Payer: Self-pay | Admitting: Family Medicine

## 2020-04-21 ENCOUNTER — Other Ambulatory Visit: Payer: Self-pay

## 2020-04-21 DIAGNOSIS — R197 Diarrhea, unspecified: Secondary | ICD-10-CM | POA: Diagnosis present

## 2020-04-21 DIAGNOSIS — I10 Essential (primary) hypertension: Secondary | ICD-10-CM | POA: Diagnosis present

## 2020-04-21 DIAGNOSIS — F17210 Nicotine dependence, cigarettes, uncomplicated: Secondary | ICD-10-CM | POA: Diagnosis present

## 2020-04-21 DIAGNOSIS — R3915 Urgency of urination: Secondary | ICD-10-CM | POA: Diagnosis present

## 2020-04-21 DIAGNOSIS — Z8249 Family history of ischemic heart disease and other diseases of the circulatory system: Secondary | ICD-10-CM

## 2020-04-21 DIAGNOSIS — Z20822 Contact with and (suspected) exposure to covid-19: Secondary | ICD-10-CM | POA: Diagnosis present

## 2020-04-21 DIAGNOSIS — E860 Dehydration: Secondary | ICD-10-CM | POA: Diagnosis present

## 2020-04-21 DIAGNOSIS — K219 Gastro-esophageal reflux disease without esophagitis: Secondary | ICD-10-CM | POA: Diagnosis present

## 2020-04-21 DIAGNOSIS — H6121 Impacted cerumen, right ear: Secondary | ICD-10-CM | POA: Diagnosis present

## 2020-04-21 DIAGNOSIS — A071 Giardiasis [lambliasis]: Principal | ICD-10-CM | POA: Diagnosis present

## 2020-04-21 DIAGNOSIS — R748 Abnormal levels of other serum enzymes: Secondary | ICD-10-CM | POA: Diagnosis present

## 2020-04-21 DIAGNOSIS — E43 Unspecified severe protein-calorie malnutrition: Secondary | ICD-10-CM | POA: Insufficient documentation

## 2020-04-21 DIAGNOSIS — B20 Human immunodeficiency virus [HIV] disease: Secondary | ICD-10-CM | POA: Diagnosis present

## 2020-04-21 DIAGNOSIS — F32A Depression, unspecified: Secondary | ICD-10-CM | POA: Diagnosis present

## 2020-04-21 DIAGNOSIS — H5461 Unqualified visual loss, right eye, normal vision left eye: Secondary | ICD-10-CM | POA: Diagnosis present

## 2020-04-21 DIAGNOSIS — H6092 Unspecified otitis externa, left ear: Secondary | ICD-10-CM | POA: Diagnosis present

## 2020-04-21 DIAGNOSIS — F1290 Cannabis use, unspecified, uncomplicated: Secondary | ICD-10-CM | POA: Diagnosis present

## 2020-04-21 DIAGNOSIS — Z833 Family history of diabetes mellitus: Secondary | ICD-10-CM

## 2020-04-21 DIAGNOSIS — Z79899 Other long term (current) drug therapy: Secondary | ICD-10-CM

## 2020-04-21 DIAGNOSIS — Z21 Asymptomatic human immunodeficiency virus [HIV] infection status: Secondary | ICD-10-CM

## 2020-04-21 DIAGNOSIS — N179 Acute kidney failure, unspecified: Secondary | ICD-10-CM | POA: Diagnosis present

## 2020-04-21 DIAGNOSIS — G629 Polyneuropathy, unspecified: Secondary | ICD-10-CM | POA: Diagnosis present

## 2020-04-21 DIAGNOSIS — E876 Hypokalemia: Secondary | ICD-10-CM | POA: Diagnosis present

## 2020-04-21 DIAGNOSIS — E785 Hyperlipidemia, unspecified: Secondary | ICD-10-CM | POA: Diagnosis present

## 2020-04-21 DIAGNOSIS — E871 Hypo-osmolality and hyponatremia: Secondary | ICD-10-CM | POA: Diagnosis present

## 2020-04-21 LAB — GASTROINTESTINAL PANEL BY PCR, STOOL (REPLACES STOOL CULTURE)

## 2020-04-21 LAB — COMPREHENSIVE METABOLIC PANEL
ALT: 15 U/L (ref 0–44)
AST: 14 U/L — ABNORMAL LOW (ref 15–41)
Albumin: 4.4 g/dL (ref 3.5–5.0)
Alkaline Phosphatase: 62 U/L (ref 38–126)
Anion gap: 8 (ref 5–15)
BUN: 20 mg/dL (ref 6–20)
CO2: 20 mmol/L — ABNORMAL LOW (ref 22–32)
Calcium: 9.4 mg/dL (ref 8.9–10.3)
Chloride: 103 mmol/L (ref 98–111)
Creatinine, Ser: 1.61 mg/dL — ABNORMAL HIGH (ref 0.61–1.24)
GFR, Estimated: 51 mL/min — ABNORMAL LOW (ref >60)
Glucose, Bld: 112 mg/dL — ABNORMAL HIGH (ref 70–99)
Potassium: 3.6 mmol/L (ref 3.5–5.1)
Sodium: 131 mmol/L — ABNORMAL LOW (ref 135–145)
Total Bilirubin: 0.6 mg/dL (ref 0.3–1.2)
Total Protein: 7.9 g/dL (ref 6.5–8.1)

## 2020-04-21 LAB — CBC
HCT: 42 % (ref 39.0–52.0)
Hemoglobin: 14.1 g/dL (ref 13.0–17.0)
MCH: 32.8 pg (ref 26.0–34.0)
MCHC: 33.6 g/dL (ref 30.0–36.0)
MCV: 97.7 fL (ref 80.0–100.0)
Platelets: 349 10*3/uL (ref 150–400)
RBC: 4.3 MIL/uL (ref 4.22–5.81)
RDW: 13.2 % (ref 11.5–15.5)
WBC: 7.3 10*3/uL (ref 4.0–10.5)
nRBC: 0 % (ref 0.0–0.2)

## 2020-04-21 LAB — C DIFFICILE QUICK SCREEN W PCR REFLEX
C Diff antigen: NEGATIVE
C Diff interpretation: NOT DETECTED
C Diff toxin: NEGATIVE

## 2020-04-21 LAB — RESP PANEL BY RT-PCR (FLU A&B, COVID) ARPGX2
Influenza A by PCR: NEGATIVE
Influenza B by PCR: NEGATIVE
SARS Coronavirus 2 by RT PCR: NEGATIVE

## 2020-04-21 LAB — LIPASE, BLOOD: Lipase: 65 U/L — ABNORMAL HIGH (ref 11–51)

## 2020-04-21 MED ORDER — METRONIDAZOLE 500 MG PO TABS
500.0000 mg | ORAL_TABLET | Freq: Two times a day (BID) | ORAL | Status: DC
  Administered 2020-04-21 – 2020-04-24 (×6): 500 mg via ORAL
  Filled 2020-04-21 (×6): qty 1

## 2020-04-21 MED ORDER — ACETAMINOPHEN 325 MG PO TABS
650.0000 mg | ORAL_TABLET | Freq: Four times a day (QID) | ORAL | Status: DC | PRN
  Administered 2020-04-22: 650 mg via ORAL
  Filled 2020-04-21: qty 2

## 2020-04-21 MED ORDER — LACTATED RINGERS IV BOLUS
2000.0000 mL | Freq: Once | INTRAVENOUS | Status: AC
  Administered 2020-04-21: 2000 mL via INTRAVENOUS

## 2020-04-21 MED ORDER — ONDANSETRON HCL 4 MG PO TABS: 4.0000 mg | ORAL_TABLET | Freq: Four times a day (QID) | ORAL | Status: DC | PRN

## 2020-04-21 MED ORDER — PANTOPRAZOLE SODIUM 40 MG PO TBEC
40.0000 mg | DELAYED_RELEASE_TABLET | Freq: Every day | ORAL | Status: DC
  Administered 2020-04-22 – 2020-04-24 (×3): 40 mg via ORAL
  Filled 2020-04-21 (×3): qty 1

## 2020-04-21 MED ORDER — ENOXAPARIN SODIUM 40 MG/0.4ML ~~LOC~~ SOLN
40.0000 mg | SUBCUTANEOUS | Status: DC
  Administered 2020-04-22: 40 mg via SUBCUTANEOUS
  Filled 2020-04-21: qty 0.4

## 2020-04-21 MED ORDER — BICTEGRAVIR-EMTRICITAB-TENOFOV 50-200-25 MG PO TABS
1.0000 | ORAL_TABLET | Freq: Every day | ORAL | Status: DC
  Administered 2020-04-22 – 2020-04-24 (×3): 1 via ORAL
  Filled 2020-04-21 (×4): qty 1

## 2020-04-21 MED ORDER — SODIUM CHLORIDE 0.9 % IV SOLN: INTRAVENOUS | Status: DC

## 2020-04-21 MED ORDER — CARBAMIDE PEROXIDE 6.5 % OT SOLN
5.0000 [drp] | Freq: Two times a day (BID) | OTIC | Status: DC
  Administered 2020-04-21 – 2020-04-23 (×4): 5 [drp] via OTIC
  Filled 2020-04-21: qty 15

## 2020-04-21 NOTE — H&P (Signed)
Family Medicine Teaching [facility] Admission History and Physical Service Pager: [phone]  Patient name: Ryan Cruz Medical record number: [id number] Date of birth: Jul 23, 1965 Age: 55 y.o. Gender: male  Primary Care Provider: Hoy Register, MD Consultants: None Code Status: FULL Preferred Emergency Contact: Dad, Erenest Rasher and Navon Obie, wife, [phone]  Chief Complaint: Diarrhea  Assessment and Plan: Ryan Cruz is a 55 y.o. male presenting with diarrhea x1 week. PMH is significant for HIV (on Biktarvy), previous Giardia infection last year, HTN, HLD, asthma, neuropathy, depression.   Diarrhea: acute Patient with known history of HIV (on ART), with diarrhea x1 week. History of giardia last year. In ED, received 2L LR bolus. Lipase elevated at 65, CBC within normal limits. CMP with evidence of AKI (see below). C. Diff negative. GIPP pending. Vitals stable, afebrile. Must consider opportunistic infection in the setting of HIV and recent antibiotic use (Cipro for presumed ear infection). Patient is on ART therapy with recent undetectable viral load in January of this year. Differential for diarrhea include infectious vs non-infections etiologies. Infectious causes include parasitic infections such as giardia, protozoal infectious such as cryptosporidiosis, microsporidia, toxoplasma. Also, viruses such as CMV (most common in AIDS, though unlikely as patient has well-controlled HIV) and bacteria such as salmonella, shigella, e. Coli,  Non-infectious causes would include ART-associated diarrhea, HIV enteropathy, IBS, SIBO. Will admit patient for fluid rehydration given his AKI and symptomatic treatment. GIPP will help to guide course of treatment. -Admit to med-surg, attending Dr. Pollie Meyer  -Enteric precautions -Vitals per floor protocol  -f/u GI pathogen panel -Zofran 4mg  PRN nausea, vomiting -IVF at 1.5 maintenance rate  AKI 2/2 Diarrhea: acute Creatinine 1.61, increased  from January when it was 1.29. Likely pre-renal in the setting of diarrhea.  -BMP in AM -IVF at 160 mL/hr (1.5x maintenance rate) -Avoid nephrotoxic agents as able  L Otitis Externa  Impacted cerumen Patient recently had ear infection 2 weeks ago and was treated with eyrthromycin otic drops and oral cipro, which he completed. TM on L non-erythematous, non-bulging. Right side with impacted cerumen, no sx currently. - debrox drops to right ear 5 drops BID   HIV on ART and with undetectable viral load On Biktarvy. HIV-1 RNA quant 03/10/20 with <20 RNA quant and undetectable. CD4 T cell at that time WNL at 509, CD4% slightly decreased at 28%.  -Continue Biktarvy daily   Hypertension  Home meds include amlodipine 10 mg daily though patient admits to stopping this medication last month as it "makes me sick". He still takes home blood pressures and notes systolic's typically 130's and diastolic's 70's. BP 132/82 on admission.  -Hold amlodipine  -Vitals per floor protocol   GERD Home medication notable for omeprazole 20 mg daily. -Continue omeprazole  Tobacco Use  THC use Patient smokes 1 pack in 2-3 days. Declines nicotine patch. Admits to [facility] use though states he has cut down. "A bag of weed will last me about a month or so". - Can have nicotine patch later if wishes -Encourage cessation  FEN/GI: CLD and ADAT Prophylaxis: lovenox  Disposition: Med-Surg  History of Present Illness:  Ryan Cruz is a 55 y.o. male presenting with diarrhea x1 week. Patient reports bowel movements x5 since arriving at ED at 4 AM. His BMs get worse with eating, cannot keep any food or fluid down. Reports his pills are coming out whole. He says it feels the same as last year when he had giardia, he cannot lay flat due  to gas pain and has to burp. Has to sit at at least 45* angle. He denies nausea, + cramps, diarrhea, gas, night sweats, chills, no true fever, unintentional weight loss. Urinary urgency, no  frequency or dysuria. Denies vomiting, no fever, coughing, CP, SOB. He reports some BR blood streaks in diarrhea. Diarrhea is completely liquid. Denies any traveling, camping. He had broccoli/chicken/cheese/rice casserole last Friday 04/12/20 which started diarrhea. He reports numb toes, a chronic problem. Patient had left otitis externa 2 weeks ago and had oral cipro, which he finished.  ED Course: Given 2L LR bolus. Lab work notable for negative c. Diff, elevated lipase at 65, hyponatremia at 131, creatinine 1.61. CBC WNL. GI panel collected.     Review Of Systems: Per HPI with the following additions:  Review of Systems  Constitutional: Positive for fatigue. Negative for activity change, appetite change, chills and fever.  HENT: Negative for congestion.   Respiratory: Negative for cough and shortness of breath.   Cardiovascular: Negative for chest pain.  Gastrointestinal: Positive for abdominal pain, blood in stool, diarrhea and nausea. Negative for abdominal distention, anal bleeding, constipation and vomiting.  Neurological: Positive for numbness. Negative for light-headedness and headaches.       Numb toes chronically  Psychiatric/Behavioral: Negative for confusion and decreased concentration.     Patient Active Problem List   Diagnosis Date Noted  . Acute diffuse otitis externa of left ear 03/15/2020  . Numbness of feet 03/11/2020  . Intractable vomiting with nausea   . Nausea vomiting and diarrhea   . AKI (acute kidney injury) ([facility]) 08/14/2019  . Foot lesion 06/05/2019  . Healthcare maintenance 04/26/2018  . Hepatitis B immune 11/18/2016  . Medically noncompliant   . 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 Other headache syndrome   . Optic neuritis 04/16/2016  . Chest pain 11/20/2015  . Hypoglycemia 01/29/2014  . Allergic sinusitis 06/29/2012  . Pilonidal cyst 01/04/2012  . Depression 06/25/2010  . 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  Essential hypertension 12/12/2009  . Ventral hernia 03/28/2009  . NEUROPATHY, NOS 04/30/2008  . DENTAL CARIES 12/14/2007  . TOBACCO USER 06/01/2007  . VERTIGO 05/13/2007  . Human immunodeficiency virus (HIV) disease ([facility]) 02/24/2006  . HYPERLIPIDEMIA 02/24/2006  . Rectal bleeding 11/19/2005  . ASTHMA 12/11/1994    Past Medical History: Past Medical History:  Diagnosis Date  . Anxiety   . Asthma   . Depression   . HIV (human immunodeficiency virus infection) ([facility])   . Hypertension   . Hypoglycemia    sugars drop ion    Past Surgical History: Past Surgical History:  Procedure Laterality Date  . BIOPSY  08/18/2019   Procedure: BIOPSY;  Surgeon: Lynann Bologna, MD;  Location: [facility] ENDOSCOPY;  Service: Endoscopy;;  . COLONOSCOPY  02/12/2012   Procedure: COLONOSCOPY;  Surgeon: Romie Levee, MD;  Location: [facility] ENDOSCOPY;  Service: Endoscopy;  Laterality: N/A;  . ESOPHAGOGASTRODUODENOSCOPY (EGD) WITH PROPOFOL N/A 08/18/2019   Procedure: ESOPHAGOGASTRODUODENOSCOPY (EGD) WITH PROPOFOL;  Surgeon: Lynann Bologna, MD;  Location: [facility] ENDOSCOPY;  Service: Endoscopy;  Laterality: N/A;  . HERNIA REPAIR     umbilibcal    Social History: Social History   Tobacco Use  . Smoking status: Current Every Day Smoker    Packs/day: 0.20    Years: 15.00    Pack years: 3.00    Types: Cigarettes  . Smokeless tobacco: Never Used  Substance Use Topics  . Alcohol use: Yes  Alcohol/week: 2.0 standard drinks    Types: 1 Glasses of wine, 1 Shots of liquor per week    Comment: occasional  . Drug use: Yes    Frequency: 7.0 times per week    Types: Marijuana    Comment: marijuana daily   Additional social history: patient has HIV, is on HART, undetectable, his wife does not know of HIV status, patient preference. Please also refer to relevant sections of EMR.  Family History: Family History  Problem Relation Age of Onset  . Diabetes Mother   . Hypertension Mother   . Heart disease Mother   .  Congestive Heart Failure Mother   . Diabetes Father   . Hypertension Father   . Diabetes Sister   . HIV Sister   . Hypertension Sister   . Diabetes Brother   . Hypertension Brother   . Diabetes Brother   . Hypertension Brother   . Diabetes Brother   . Hypertension Brother   . Diabetes Brother   . Hypertension Brother    Allergies and Medications: Allergies  Allergen Reactions  . Doxycycline Rash   No current facility-administered medications on file prior to encounter.   Current Outpatient Medications on File Prior to Encounter  Medication Sig Dispense Refill  . acetaminophen (TYLENOL) 325 MG tablet Take 650 mg by mouth every 6 (six) hours as needed.    Marland Kitchen amLODipine (NORVASC) 10 MG tablet Take 1 tablet by mouth daily. 30 tablet 6  . bictegravir-emtricitabine-tenofovir AF (BIKTARVY) 50-200-25 MG TABS tablet Take 1 tablet by mouth daily. 30 tablet 4  . ciprofloxacin (CIPRO) 500 MG tablet Take 1 tablet (500 mg total) by mouth 2 (two) times daily. 14 tablet 0  . Cyanocobalamin (VITAMIN B 12 PO) Take 1 tablet by mouth daily.    Marland Kitchen neomycin-polymyxin-hydrocortisone (CORTISPORIN) OTIC solution Place 3 drops into the left ear 4 (four) times daily. 10 mL 0  . omeprazole (PRILOSEC) 20 MG capsule TAKE 1 CAPSULE(20 MG) BY MOUTH DAILY 30 capsule 1  . ondansetron (ZOFRAN) 4 MG tablet Take 1 tablet (4 mg total) by mouth every 6 (six) hours as needed for nausea. 5 tablet 0  . pseudoephedrine-acetaminophen (TYLENOL SINUS) 30-500 MG TABS tablet Take 1 tablet by mouth every 4 (four) hours as needed (sinus congestion).      Objective: BP 132/82 (BP Location: Right Arm)   Pulse 61   Temp 98.2 F (36.8 C) (Oral)   Resp 18   SpO2 99%  Exam: General: Awake, alert, in no distress, pleasant ENTM: Right ear impacted with dark cerumen, left ear with normal and non-bulging TM, some dark cerumen appreciated but not blocking canal  Neck: supple Cardiovascular: RRR without murmur Respiratory: CTAB  without wheezing/rhonchi/rales Gastrointestinal: mild diffuse tenderness to palpation, worse in LLQ and RUQ, hyperactive bowel sounds, no rebound or guarding MSK: Moves all extremities spontaneously, no obvious deformities Derm: warm and dry  Neuro: No focal deficits Psych: Mood appropriate   Labs and Imaging: CBC BMET  Recent Labs  Lab 04/21/20 0407  WBC 7.3  HGB 14.1  HCT 42.0  PLT 349   Recent Labs  Lab 04/21/20 0407  NA 131*  K 3.6  CL 103  CO2 20*  BUN 20  CREATININE 1.61*  GLUCOSE 112*  CALCIUM 9.4      No results found.  Sabino Dick, DO 04/21/2020, 10:38 AM PGY-1, [facility] FPTS Intern pager: [phone], text pages welcome  FPTS Upper-Level Resident Addendum   I have independently  interviewed and examined the patient. I have discussed the above with the original author and agree with their documentation. My edits for correction/addition/clarification are in pink. Please see also any attending notes.   Shirlean Mylar, M.D. PGY-2, [facility] 04/21/2020 11:20 AM  FPTS Service pager: [phone] (text pages welcome through [facility])

## 2020-04-21 NOTE — ED Triage Notes (Addendum)
Pt said x 1 week has been having diarrhea and nausea. Pt said he cant eat or drink anything. Pt said he is weak and feels as if he has no energy. Pt said feels like he gets sweaty and clammy with chills.

## 2020-04-21 NOTE — ED Provider Notes (Signed)
Hand off from Dr. Christy Gentles Physical Exam  BP 128/79 (BP Location: Right Arm)   Pulse 68   Temp 98.1 F (36.7 C) (Oral)   Resp 18   SpO2 99%   Physical Exam  ED Course/Procedures     Procedures  MDM  55 yo male hiv presents with watery diarrhea for a week.  Bloating and nausea.  No fever, chills travel.  Recently on abx.  C. Dif and gi panel pending. Patient receiving 2 liters ns and reassess for possible d/c History of giardia last year. 10:39 AM Patient has had 1 L of fluid.  He attempted p.o. intake and had worsening diarrhea. C. difficile is negative Given patient's ongoing stooling, acute kidney injury, plan admission for ongoing hydration with ongoing watery diarrhea. Of note, patient has requested that medical history not be Discussed with family  10:46 AM Discussed with Dr. Nita Sells, on-call for family practice.  She will see for admission Covid PCR test      Pattricia Boss, MD 04/21/20 1046

## 2020-04-21 NOTE — ED Provider Notes (Signed)
[facility] EMERGENCY DEPARTMENT Provider Note   CSN: [id number] Arrival date & time: 04/21/20  0353     History Chief Complaint  Patient presents with  . Diarrhea  . Nausea    Ryan Cruz is a 55 y.o. male.  The history is provided by the patient.  Diarrhea Quality:  Watery Severity:  Severe Onset quality:  Gradual Duration:  1 week Timing:  Intermittent Progression:  Worsening Relieved by:  Nothing Worsened by:  Nothing Associated symptoms: chills and diaphoresis   Associated symptoms: no abdominal pain, no recent cough, no fever and no vomiting   Risk factors: recent antibiotic use   Patient with history of HIV that is controlled with Biktarvy presents with diarrhea.  Patient reports multiple episodes of profuse watery diarrhea for the past week.  It is nonbloody.  No vomiting.  He reports nausea and abdominal discomfort and bloating. No recent travel.  He has been on antibiotics recently (Cipro) for an ear infection. He does report previous history of Giardia     Past Medical History:  Diagnosis Date  . Anxiety   . Asthma   . Depression   . HIV (human immunodeficiency virus infection) ([place])   . Hypertension   . Hypoglycemia    sugars drop ion    Patient Active Problem List   Diagnosis Date Noted  . Acute diffuse otitis externa of left ear 03/15/2020  . Numbness of feet 03/11/2020  . Intractable vomiting with nausea   . Nausea vomiting and diarrhea   . AKI (acute kidney injury) ([place]) 08/14/2019  . Foot lesion 06/05/2019  . Healthcare maintenance 04/26/2018  . Hepatitis B immune 11/18/2016  . Medically noncompliant   . 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 Other headache syndrome   . Optic neuritis 04/16/2016  . Chest pain 11/20/2015  . Hypoglycemia 01/29/2014  . Allergic sinusitis 06/29/2012  . Pilonidal cyst 01/04/2012  . Depression 06/25/2010  . 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  Essential hypertension 12/12/2009  . Ventral hernia 03/28/2009  . NEUROPATHY, NOS 04/30/2008  . DENTAL CARIES 12/14/2007  . TOBACCO USER 06/01/2007  . VERTIGO 05/13/2007  . Human immunodeficiency virus (HIV) disease ([place]) 02/24/2006  . HYPERLIPIDEMIA 02/24/2006  . Rectal bleeding 11/19/2005  . ASTHMA 12/11/1994    Past Surgical History:  Procedure Laterality Date  . BIOPSY  08/18/2019   Procedure: BIOPSY;  Surgeon: Jackquline Denmark, MD;  Location: [facility] ENDOSCOPY;  Service: Endoscopy;;  . COLONOSCOPY  02/12/2012   Procedure: COLONOSCOPY;  Surgeon: Leighton Ruff, MD;  Location: [facility] ENDOSCOPY;  Service: Endoscopy;  Laterality: N/A;  . ESOPHAGOGASTRODUODENOSCOPY (EGD) WITH PROPOFOL N/A 08/18/2019   Procedure: ESOPHAGOGASTRODUODENOSCOPY (EGD) WITH PROPOFOL;  Surgeon: Jackquline Denmark, MD;  Location: [facility] ENDOSCOPY;  Service: Endoscopy;  Laterality: N/A;  . HERNIA REPAIR     umbilibcal       Family History  Problem Relation Age of Onset  . Diabetes Mother   . Hypertension Mother   . Heart disease Mother   . Congestive Heart Failure Mother   . Diabetes Father   . Hypertension Father   . Diabetes Sister   . HIV Sister   . Hypertension Sister   . Diabetes Brother   . Hypertension Brother   . Diabetes Brother   . Hypertension Brother   . Diabetes Brother   . Hypertension Brother   . Diabetes Brother   . Hypertension Brother     Social History  Tobacco Use  . Smoking status: Current Every Day Smoker    Packs/day: 0.20    Years: 15.00    Pack years: 3.00    Types: Cigarettes  . Smokeless tobacco: Never Used  Substance Use Topics  . Alcohol use: Yes    Alcohol/week: 2.0 standard drinks    Types: 1 Glasses of wine, 1 Shots of liquor per week    Comment: occasional  . Drug use: Yes    Frequency: 7.0 times per week    Types: Marijuana    Comment: marijuana daily    Home Medications Prior to Admission medications   Medication Sig Start Date End Date Taking? Authorizing Provider   acetaminophen (TYLENOL) 325 MG tablet Take 650 mg by mouth every 6 (six) hours as needed.    [provider]  amLODipine (NORVASC) 10 MG tablet Take 1 tablet by mouth daily. 09/05/19   Charlott Rakes, MD  bictegravir-emtricitabine-tenofovir AF (BIKTARVY) 50-200-25 MG TABS tablet Take 1 tablet by mouth daily. 03/11/20   Golden Circle, FNP  ciprofloxacin (CIPRO) 500 MG tablet Take 1 tablet (500 mg total) by mouth 2 (two) times daily. 03/15/20   Golden Circle, FNP  Cyanocobalamin (VITAMIN B 12 PO) Take 1 tablet by mouth daily.    [provider]  neomycin-polymyxin-hydrocortisone (CORTISPORIN) OTIC solution Place 3 drops into the left ear 4 (four) times daily. 03/15/20   Golden Circle, FNP  omeprazole (PRILOSEC) 20 MG capsule TAKE 1 CAPSULE(20 MG) BY MOUTH DAILY 10/06/19   Charlott Rakes, MD  ondansetron (ZOFRAN) 4 MG tablet Take 1 tablet (4 mg total) by mouth every 6 (six) hours as needed for nausea. 08/19/19   Patriciaann Clan, DO  pseudoephedrine-acetaminophen (TYLENOL SINUS) 30-500 MG TABS tablet Take 1 tablet by mouth every 4 (four) hours as needed (sinus congestion).    [provider]    Allergies    Doxycycline  Review of Systems   Review of Systems  Constitutional: Positive for chills and diaphoresis. Negative for fever.  Respiratory: Negative for cough.   Gastrointestinal: Positive for diarrhea. Negative for abdominal pain, blood in stool and vomiting.  Genitourinary: Negative for difficulty urinating.  All other systems reviewed and are negative.   Physical Exam Updated Vital Signs BP 128/79 (BP Location: Right Arm)   Pulse 68   Temp 98.1 F (36.7 C) (Oral)   Resp 18   SpO2 99%   Physical Exam CONSTITUTIONAL: Well developed/well nourished HEAD: Normocephalic/atraumatic EYES: EOMI/PERRL, no icterus ENMT: Mucous membranes moist NECK: supple no meningeal signs SPINE/BACK:entire spine nontender CV: S1/S2 noted, no murmurs/rubs/gallops  noted LUNGS: Lungs are clear to auscultation bilaterally, no apparent distress ABDOMEN: soft, nontender, no rebound or guarding, bowel sounds noted throughout abdomen GU:no cva tenderness NEURO: Pt is awake/alert/appropriate, moves all extremitiesx4.  No facial droop.   EXTREMITIES: pulses normal/equal, full ROM SKIN: warm, color normal PSYCH: no abnormalities of mood noted, alert and oriented to situation  ED Results / Procedures / Treatments   Labs (all labs ordered are listed, but only abnormal results are displayed) Labs Reviewed  LIPASE, BLOOD - Abnormal; Notable for the following components:      Result Value   Lipase 65 (*)    All other components within normal limits  COMPREHENSIVE METABOLIC PANEL - Abnormal; Notable for the following components:   Sodium 131 (*)    CO2 20 (*)    Glucose, Bld 112 (*)    Creatinine, Ser 1.61 (*)    AST 14 (*)  GFR, Estimated 51 (*)    All other components within normal limits  C DIFFICILE QUICK SCREEN W PCR REFLEX  GASTROINTESTINAL PANEL BY PCR, STOOL (REPLACES STOOL CULTURE)  CBC    EKG None  Radiology No results found.  Procedures Procedures   Medications Ordered in ED Medications  lactated ringers bolus 2,000 mL (has no administration in time range)    ED Course  I have reviewed the triage vital signs and the nursing notes.  Pertinent labs   results that were available during my care of the patient were reviewed by me and considered in my medical decision making (see chart for details).    MDM Rules/Calculators/A&P                          5:59 AM Patient with history of HIV that is controlled on [place] presenting with diarrhea for the past week. He does appear dehydrated.  We will plan to obtain stool sample.  He has no focal abdominal tenderness, and is otherwise nontoxic-appearing  He does have previous history of Giardia Of note, entire history and physical were performed without wife present at patient  request. 7:11 AM At signout to dr ray, reassess patient after IV fluids  Final Clinical Impression(s) / ED Diagnoses Final diagnoses:  None    Rx / DC Orders ED Discharge Orders    None       Ripley Fraise, MD 04/21/20 [phone]

## 2020-04-21 NOTE — ED Notes (Signed)
Pt reports that his abd is not hurting it just feels upset

## 2020-04-21 NOTE — Progress Notes (Addendum)
Family Medicine Teaching Service Daily Progress Note Intern Pager: [phone]  Patient name: Ryan Cruz Medical record number: [id number] Date of birth: 1966/01/22 Age: 55 y.o. Gender: male  Primary Care Provider: Hoy Register, MD Consultants: None Code Status: FULL  Pt Overview and Major Events to Date:  3/13: Admitted. GIPP +Giardia, started on Flagyl  Assessment and Plan: Ryan Cruz is a 55 y.o. male presenting with diarrhea x1 week found to have Giardia on GIPP. PMH is significant for HIV (on Biktarvy), previous Giardia infection last year, HTN, HLD, asthma, neuropathy, depression  Diarrhea 2/2 Giardia  GIPP returned positive for Giardia. Patient started on Flagyl 500 mg BID. Same presentation and treatment as prior infection last July. CBC with Hgb 12.5, likely dilutional. BMP with mild hypokalemia. Today patient feels improved but is still having multiple episodes of diarrhea and not able to keep food down. Would like to advance diet but states he will be cautious of what he orders.  -Continue Flagyl 500 mg BID for 5 day course, Day 2/7 -Zofran 4mg  PRN nausea, vomiting (has not needed) -IVF at 1.5x maintenance rate -Advance diet to regular -Enteric precautions  Pre-renal AKI 2/2 Diarrhea: Improving Creatinine improved from 1.61> 1.29 this AM. GFR >60.  -Continue IVF -BMP in AM -Avoid nephrotoxic agents as able   Hypokalemia: acute, stable Likely 2/2 GI losses.  -Replete with 40 mEq tablet -BMP tomorrow  Right impacted cerumen -Continue Debrox 5 drops BID to right ear  HIV on ART: undetectable viral load  -Continue Biktarvy daily   HTN BP's ranging 104-149/68-92. Most recent 135/72. Rx Amlodipine 10 mg daily though patient admits to not taking this as prescribed due to adverse side effects. -Holding Amlodipine -Vitals per floor protocol   GERD: chronic, stable Home medication notable for omeprazole 20 mg daily. -Continue Protonix per formulary.  Tobacco use   THC use -Encourage cessation   FEN/GI: Regular diet PPx: Lovenox   Status is: Observation  The patient will require care spanning > 2 midnights and should be moved to inpatient because: IV treatments appropriate due to intensity of illness or inability to take PO  Dispo: The patient is from: Home              Anticipated d/c is to: Home              Patient currently is not medically stable to d/c.   Difficult to place patient No   Subjective:  Patient feels improved this AM. States he was up all night making trips to the bathroom. He is still having several episodes of diarrhea. Also has abdominal soreness. He feels better in the sense that he can find positions that help him with the discomfort and help him not go to the bathroom as often. Still feels that he is unable to really keep foods down.   Objective: Temp:  [98.1 F (36.7 C)-99.1 F (37.3 C)] 98.3 F (36.8 C) (03/13 1822) Pulse Rate:  [61-97] 63 (03/13 1822) Resp:  [13-23] 17 (03/13 1822) BP: (104-149)/(78-96) 131/83 (03/13 1822) SpO2:  [99 %-100 %] 100 % (03/13 1822) Physical Exam: Exam limited as patient was in bathroom General: Awake, alert, in no distress Abdomen: Patient self-palpated abdomen while on toilet and notes discomfort to b/l lower quadrants  Laboratory: Recent Labs  Lab 04/21/20 0407  WBC 7.3  HGB 14.1  HCT 42.0  PLT 349   Recent Labs  Lab 04/21/20 0407  NA 131*  K 3.6  CL 103  CO2 20*  BUN 20  CREATININE 1.61*  CALCIUM 9.4  PROT 7.9  BILITOT 0.6  ALKPHOS 62  ALT 15  AST 14*  GLUCOSE 112*   GIPP: +Giardia  Imaging/Diagnostic Tests: None  Sabino Dick, DO 04/21/2020, 6:55 PM PGY-1, [facility] Family Medicine FPTS Intern pager: [phone], text pages welcome

## 2020-04-21 NOTE — Hospital Course (Addendum)
Ryan Cruz is a 55 y.o. male presenting with diarrhea x1 week. PMH is significant for HIV (on Biktarvy), previous Giardia infection last year, HTN, HLD, asthma, neuropathy, depression.  Diarrhea 2/2 Giardia  Patient with 1 week history of diarrhea. In ED, received 2L LR bolus. Labs notable for lipase of 65. Vitals were stable, patient afebrile. C. Diff negative. GIPP notable for giardia. Patient was started on flagyl 500mg  BID with instructions to finish 7 day course (3/13-3/20). Received fluids with NS at 1.5x maintenance rate on admission. Patient able to tolerate PO prior to d/c.   Pre-renal AKI 2/2 Diarrhea Creatinine on admission 1.61. Started on IVF at 1.5x maintenance rate. Creatinine improved by day 2 of admission and fluids decreased. Without diarrhea and tolerating PO on d/c. Creatinine 1.15 on d/c.   Hypertension Home medications include Amlodipine 10 mg. This was held on admission due to stable BP's and patient report of Amlodipine making him "sick". Did have some episodes of hypertension throughout admission with systolic in 950'D. Recommend reassessment and further management outpatient.    Follow up Recommendations Recommend caution with antibiotics as patient appears to be more sensitive to opportunistic infections given HIV status.  Continue to encourage THC cessation.

## 2020-04-21 NOTE — ED Notes (Signed)
Attempted report 

## 2020-04-22 DIAGNOSIS — A071 Giardiasis [lambliasis]: Principal | ICD-10-CM | POA: Diagnosis present

## 2020-04-22 DIAGNOSIS — E43 Unspecified severe protein-calorie malnutrition: Secondary | ICD-10-CM | POA: Insufficient documentation

## 2020-04-22 LAB — BASIC METABOLIC PANEL
Anion gap: 9 (ref 5–15)
BUN: 12 mg/dL (ref 6–20)
CO2: 17 mmol/L — ABNORMAL LOW (ref 22–32)
Calcium: 8.7 mg/dL — ABNORMAL LOW (ref 8.9–10.3)
Chloride: 110 mmol/L (ref 98–111)
Creatinine, Ser: 1.29 mg/dL — ABNORMAL HIGH (ref 0.61–1.24)
GFR, Estimated: >60 mL/min (ref >60)
Glucose, Bld: 77 mg/dL (ref 70–99)
Potassium: 3.3 mmol/L — ABNORMAL LOW (ref 3.5–5.1)
Sodium: 136 mmol/L (ref 135–145)

## 2020-04-22 LAB — CBC
HCT: 37 % — ABNORMAL LOW (ref 39.0–52.0)
Hemoglobin: 12.5 g/dL — ABNORMAL LOW (ref 13.0–17.0)
MCH: 32.9 pg (ref 26.0–34.0)
MCHC: 33.8 g/dL (ref 30.0–36.0)
MCV: 97.4 fL (ref 80.0–100.0)
Platelets: 286 10*3/uL (ref 150–400)
RBC: 3.8 MIL/uL — ABNORMAL LOW (ref 4.22–5.81)
RDW: 13.2 % (ref 11.5–15.5)
WBC: 9.5 10*3/uL (ref 4.0–10.5)
nRBC: 0 % (ref 0.0–0.2)

## 2020-04-22 MED ORDER — POTASSIUM CHLORIDE CRYS ER 20 MEQ PO TBCR
40.0000 meq | EXTENDED_RELEASE_TABLET | Freq: Once | ORAL | Status: AC
  Administered 2020-04-22: 40 meq via ORAL
  Filled 2020-04-22: qty 2

## 2020-04-22 MED ORDER — ENSURE ENLIVE PO LIQD
237.0000 mL | Freq: Three times a day (TID) | ORAL | Status: DC
  Administered 2020-04-22 – 2020-04-23 (×6): 237 mL via ORAL
  Filled 2020-04-22: qty 237

## 2020-04-22 MED ORDER — ADULT MULTIVITAMIN W/MINERALS CH
1.0000 | ORAL_TABLET | Freq: Every day | ORAL | Status: DC
  Administered 2020-04-22 – 2020-04-24 (×3): 1 via ORAL
  Filled 2020-04-22 (×3): qty 1

## 2020-04-22 NOTE — Discharge Summary (Signed)
[facility] Discharge Summary  Patient name: Ryan Cruz Medical record number: [id number] Date of birth: [birth year] Age: 55 y.o. Gender: male Date of Admission: 04/21/2020  Date of Discharge: 04/24/20 Admitting Physician: Leeanne Rio, MD  Primary Care Provider: Charlott Rakes, MD Consultants: None  Indication for Hospitalization: Diarrhea, dehydration  Discharge Diagnoses/Problem List:  Giardia  Pre-renal AI HIV on ART Hypertension  Disposition: Home  Discharge Condition: Stable  Discharge Exam:  Blood pressure (!) 157/87, pulse (!) 59, temperature 98 F (36.7 C), temperature source Oral, resp. rate 18, weight 60.1 kg, SpO2 100 %. Gen: Awake, alert, in no distress Resp: CTAB without wheezing/rhonchi/rales Cardio: RRR without murmur Abd: soft, non-distended, non-tender in all quadrants, no rebound or guarding Ext: without edema, 2+ radial and DP pulses  Brief Hospital Course:  Ryan Cruz is a 55 y.o. male presenting with diarrhea x1 week. PMH is significant for HIV (on Biktarvy), previous Giardia infection last year, HTN, HLD, asthma, neuropathy, depression.  Diarrhea 2/2 Giardia  Patient with 1 week history of diarrhea. In ED, received 2L LR bolus. Labs notable for lipase of 65. Vitals were stable, patient afebrile. C. Diff negative. GIPP notable for giardia. Patient was started on flagyl 500mg  BID with instructions to finish 7 day course (3/13-3/20). Received fluids with NS at 1.5x maintenance rate on admission. Patient able to tolerate PO prior to d/c.   Pre-renal AKI 2/2 Diarrhea Creatinine on admission 1.61. Started on IVF at 1.5x maintenance rate. Creatinine improved by day 2 of admission and fluids decreased. Without diarrhea and tolerating PO on d/c. Creatinine 1.15 on d/c.   Hypertension Home medications include Amlodipine 10 mg. This was held on admission due to stable BP's and patient report of Amlodipine making him "sick".  Did have some episodes of hypertension throughout admission with systolic in 245'Y. Recommend reassessment and further management outpatient.    Follow up Recommendations 1. Recommend caution with antibiotics as patient appears to be more sensitive to opportunistic infections given HIV status.  2. Continue to encourage THC cessation.   Significant Procedures: None  Significant Labs and Imaging:  Recent Labs  Lab 04/21/20 0407 04/22/20 0445  WBC 7.3 9.5  HGB 14.1 12.5*  HCT 42.0 37.0*  PLT 349 286   Recent Labs  Lab 04/21/20 0407 04/22/20 0445 04/23/20 0031 04/24/20 0135  NA 131* 136 135 137  K 3.6 3.3* 3.5 3.6  CL 103 110 111 110  CO2 20* 17* 18* 21*  GLUCOSE 112* 77 106* 98  BUN 20 12 6 8   CREATININE 1.61* 1.29* 1.17 1.15  CALCIUM 9.4 8.7* 8.7* 8.8*  ALKPHOS 62  --   --   --   AST 14*  --   --   --   ALT 15  --   --   --   ALBUMIN 4.4  --   --   --    GIPP: + Giardia   Results/Tests Pending at Time of Discharge: None   Discharge Medications:  Allergies as of 04/24/2020      Reactions   Doxycycline Rash      Medication List    STOP taking these medications   amLODipine 10 MG tablet Commonly known as: NORVASC   neomycin-polymyxin-hydrocortisone OTIC solution Commonly known as: CORTISPORIN   ondansetron 4 MG tablet Commonly known as: ZOFRAN     TAKE these medications   acetaminophen 325 MG tablet Commonly known as: TYLENOL Take 650 mg by mouth every 6 (  six) hours as needed.   bictegravir-emtricitabine-tenofovir AF 50-200-25 MG Tabs tablet Commonly known as: BIKTARVY Take 1 tablet by mouth daily.   metroNIDAZOLE 500 MG tablet Commonly known as: FLAGYL Take 1 tablet (500 mg total) by mouth every 12 (twelve) hours for 4 days.   omeprazole 20 MG capsule Commonly known as: PRILOSEC TAKE 1 CAPSULE(20 MG) BY MOUTH DAILY   VITAMIN B 12 PO Take 1 tablet by mouth daily.       Discharge Instructions: Please refer to Patient Instructions section  of EMR for full details.  Patient was counseled important signs and symptoms that should prompt return to medical care, changes in medications, dietary instructions, activity restrictions, and follow up appointments.   Follow-Up Appointments:  Follow-up Information    Charlott Rakes, MD Follow up.   Specialty: Family Medicine Why: Follow up as needed. Contact information: [place] [address] [phone]        Campbell Riches, MD .   Specialty: Infectious Diseases Contact information: [place] [address] [phone]               Sharion Settler, DO 04/24/2020, 9:43 AM PGY-1, [place]

## 2020-04-22 NOTE — Progress Notes (Signed)
Initial Nutrition Assessment  DOCUMENTATION CODES:   Severe malnutrition in context of acute illness/injury  INTERVENTION:    Ensure Enlive po TID, each supplement provides 350 kcal and 20 grams of protein  MVI with minerals daily  NUTRITION DIAGNOSIS:   Severe Malnutrition related to acute illness (Giardia infection) as evidenced by moderate muscle depletion,moderate fat depletion,percent weight loss (10% weight loss within 3 months).  GOAL:   Patient will meet greater than or equal to 90% of their needs  MONITOR:   PO intake,Supplement acceptance,Labs  REASON FOR ASSESSMENT:        ASSESSMENT:   55 yo male admitted with diarrhea x 1 week, found to have Giardia. PMH includes HIV, previous Giardia infection July 2021, HTN, HLD, asthma, neuropathy, depression.   Patient reports diarrhea and minimal intake for the past 7-10 days. Patient says he has a sensitive stomach and typically eats small amounts. It takes him a long time to finish a large meal.   Patient continues to have loose stools. He is on a regular diet, choosing foods that he thinks will be easier on his stomach, such as soup and a grilled cheese sandwich.  He was drinking Ensure PTA a couple times per day. He agreed to continue this while in the hospital. Also recommended continuing Ensure BID at home after discharge.   Labs reviewed. K 3.3 Medications reviewed and include Flagyl.  10% weight loss within the past 1.5 months is severe.  Patient meets criteria for severe malnutrition, given moderate muscle and subcutaneous fat depletion and 10% weight loss within 3 months. This is likely related to acute illness (Giardia infection) causing diarrhea and minimal PO intake.   NUTRITION - FOCUSED PHYSICAL EXAM:  Flowsheet Row Most Recent Value  Orbital Region Mild depletion  Upper Arm Region Mild depletion  Thoracic and Lumbar Region Moderate depletion  Buccal Region Moderate depletion  Temple Region Moderate  depletion  Clavicle Bone Region Moderate depletion  Clavicle and Acromion Bone Region Moderate depletion  Scapular Bone Region Mild depletion  Dorsal Hand Mild depletion  Patellar Region Moderate depletion  Anterior Thigh Region Moderate depletion  Posterior Calf Region Mild depletion  Edema (RD Assessment) None  Hair Reviewed  Eyes Reviewed  Mouth Reviewed  Skin Reviewed  Nails Reviewed       Diet Order:   Diet Order            Diet regular Room service appropriate? Yes; Fluid consistency: Thin  Diet effective now                 EDUCATION NEEDS:   Education needs have been addressed  Skin:  Skin Assessment: Reviewed RN Assessment  Last BM:  3/14 type 7  Height:   Ht Readings from Last 1 Encounters:  09/05/19 5\' 7"  (1.702 m)    Weight:   Wt Readings from Last 1 Encounters:  04/22/20 60.1 kg    BMI:  Body mass index is 20.75 kg/m.  Estimated Nutritional Needs:   Kcal:  1800-2100  Protein:  80-100 gm  Fluid:  >/= 1.8 L    Lucas Mallow, RD, LDN, CNSC Please refer to Amion for contact information.

## 2020-04-22 NOTE — Progress Notes (Signed)
Family Medicine Teaching Service Daily Progress Note Intern Pager: [phone]  Patient name: Ryan Cruz Medical record number: [id number] Date of birth: [birth year] Age: 55 y.o. Gender: male  Primary Care Provider: Hoy Register, MD Consultants: None Code Status: FULL  Pt Overview and Major Events to Date:  3/13: Admitted. GIPP +Giardia, started on Flagyl  Assessment and Plan: Ryan Cruz a 55 y.o.malepresenting with diarrhea x1 week found to have Giardia on GIPP. PMH is significant forHIV (on Biktarvy), previous Giardia infection last year, HTN, HLD, asthma, neuropathy, depression  Diarrhea 2/2 Giardia  Patient continues on Flagyl 500 mg BID, will need to complete 7 day course. Today feels improved. States there is slight formation in his stools now, which would be indicative of infection improving.  -Continue Flagyl 500 mg BID for 7 day course -Zofran 4mg  PRN nausea, vomiting (has not needed) -Acetaminophen 650 mg q6h PRN -Decrease IVF to maintenance rate -Enteric precautions  Pre-renal AKI 2/2 Diarrhea: Improving Creatinine improved from 1.29> 1.17 this AM. GFR >60.  -Decrease IVF rate to 100 mL/hr (maintenance) -BMP in AM -Avoid nephrotoxic agents as able   Hypokalemia: reolved Potassium 3.5 this AM. Will likely continue to be stable as diarrhea improves.   Right impacted cerumen -Continue Debrox 5 drops BID to right ear  HIV on ART: undetectable viral load  -Continue Biktarvy daily   HTN BP's ranging 132-148/78-92. Most recent 140/78. Rx Amlodipine 10 mg daily though patient admits to not taking this as prescribed due to adverse side effects. -Holding Amlodipine -Vitals per floor protocol  -Follow up outpatient for further management  GERD: chronic, stable Home medication notable for omeprazole 20 mg daily. -Continue Protonix per formulary.  Tobacco use  THC use -Encourage cessation   FEN/GI: Regular diet PPx: Lovenox   Status is:  Inpatient  Remains inpatient appropriate because:IV treatments appropriate due to intensity of illness or inability to take PO   Dispo: The patient is from: Home              Anticipated d/c is to: Home              Patient currently is medically stable to d/c.   Difficult to place patient No     Subjective:  Patient notes some improvement this morning. Still has some abdominal cramping. He notes some formation in his stools now though they are still very loose. He has been able to keep his breakfast down so far. His concern is that he will become dehydrated at home. Would like to see how he does with lunch and then if well is amenable for discharge later.   Objective: Temp:  [98 F (36.7 C)-98.7 F (37.1 C)] 98.7 F (37.1 C) (03/14 1250) Pulse Rate:  [60-67] 60 (03/14 1250) Resp:  [16-18] 16 (03/14 1250) BP: (131-141)/(68-87) 132/81 (03/14 1250) SpO2:  [100 %] 100 % (03/14 1250) Weight:  [60.1 kg] 60.1 kg (03/14 1000) Physical Exam: General: Acute, in no distress Cardiovascular: RRR, no murmur Respiratory: CTAB without wheezing/rhonchi/rales Abdomen: hyperactive bowel sounds, soft, diffuse tenderness throughout all quadrants Extremities: No edema, 2+ radial and DP pulses  Laboratory: Recent Labs  Lab 04/21/20 0407 04/22/20 0445  WBC 7.3 9.5  HGB 14.1 12.5*  HCT 42.0 37.0*  PLT 349 286   Recent Labs  Lab 04/21/20 0407 04/22/20 0445  NA 131* 136  K 3.6 3.3*  CL 103 110  CO2 20* 17*  BUN 20 12  CREATININE 1.61* 1.29*  CALCIUM 9.4 8.7*  PROT 7.9  --   BILITOT 0.6  --   ALKPHOS 62  --   ALT 15  --   AST 14*  --   GLUCOSE 112* 77   Imaging/Diagnostic Tests: None new.   Sabino Dick, DO 04/22/2020, 3:14 PM PGY-1, [facility] Family Medicine FPTS Intern pager: [phone], text pages welcome

## 2020-04-23 LAB — BASIC METABOLIC PANEL
Anion gap: 6 (ref 5–15)
BUN: 6 mg/dL (ref 6–20)
CO2: 18 mmol/L — ABNORMAL LOW (ref 22–32)
Calcium: 8.7 mg/dL — ABNORMAL LOW (ref 8.9–10.3)
Chloride: 111 mmol/L (ref 98–111)
Creatinine, Ser: 1.17 mg/dL (ref 0.61–1.24)
GFR, Estimated: >60 mL/min (ref >60)
Glucose, Bld: 106 mg/dL — ABNORMAL HIGH (ref 70–99)
Potassium: 3.5 mmol/L (ref 3.5–5.1)
Sodium: 135 mmol/L (ref 135–145)

## 2020-04-24 ENCOUNTER — Other Ambulatory Visit: Payer: Self-pay | Admitting: Family Medicine

## 2020-04-24 LAB — BASIC METABOLIC PANEL
Anion gap: 6 (ref 5–15)
BUN: 8 mg/dL (ref 6–20)
CO2: 21 mmol/L — ABNORMAL LOW (ref 22–32)
Calcium: 8.8 mg/dL — ABNORMAL LOW (ref 8.9–10.3)
Chloride: 110 mmol/L (ref 98–111)
Creatinine, Ser: 1.15 mg/dL (ref 0.61–1.24)
GFR, Estimated: >60 mL/min (ref >60)
Glucose, Bld: 98 mg/dL (ref 70–99)
Potassium: 3.6 mmol/L (ref 3.5–5.1)
Sodium: 137 mmol/L (ref 135–145)

## 2020-04-24 MED ORDER — AMLODIPINE BESYLATE 10 MG PO TABS: 10.0000 mg | ORAL_TABLET | Freq: Every day | ORAL | Status: DC

## 2020-04-24 MED ORDER — METRONIDAZOLE 500 MG PO TABS: 500.0000 mg | ORAL_TABLET | Freq: Two times a day (BID) | ORAL | 0 refills | Status: AC

## 2020-04-24 NOTE — Discharge Instructions (Signed)
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  We are unsure how you may have contracted this but are glad that you are feeling better. You will need to continue taking Metronidazole twice daily until 3/20. We are so glad you are feeling better.  Be sure to follow-up with your regularly scheduled appointments.  Please also be sure to follow-up with your PCP at your earliest convenience. Your blood pressures were a little high and since you are not taking your home blood pressure medication it would be good to see your doctor for a recheck. You may need a different medication for better control. Thank you for allowing [place] to take care of you.  Take care, [facility] family medicine team   Giardiasis  Giardiasis is an infection of the small intestine with the parasite Giardia. The parasite is often found in unclean (contaminated) water or areas with poor sanitation. It usually takes 1-3 weeks after swallowing the parasite to become sick. The illness usually lasts 2-6 weeks, but it may last longer. What are the causes? This condition is caused by swallowing the parasite. This can happen if you:  Drink contaminated water or eat ice that was made with contaminated water.  Eat contaminated food. This can include raw food that was washed with contaminated water.  Come into contact with the stool (feces) of a person who is infected and then passing the parasite from your hands to your mouth. This can happen after coming in contact with a surface that contains feces, such as bathroom handles or diaper changing tables.  Swallow contaminated water when swimming in lakes, ponds, or rivers. What increases the risk? You are more likely to develop this condition if you:  Do not have access to clean water.  Are the parent of a young child.  Work at a daycare or long-term care facility.  Live with or care for someone with  giardiasis.  Frequently camp or hike and drink unclean water.  Travel to areas that to do not have a clean water supply.  Have anal sex without a condom with someone who is infected. You may also be more likely to develop this condition if you have certain digestive or gastric disorders. What are the signs or symptoms? Symptoms of this condition include:  Bad-smelling and watery diarrhea.  Nausea.  Stomach cramps and pain.  Gas or bloating.  Greasy feces that float.  Fatigue.  Fever. Severe symptoms of this condition include:  Weight loss.  Dehydration. Signs of dehydration can include: ? Dry mouth and sticky tongue. ? Headaches. ? Dizziness. ? Irritability. ? Dry skin. ? Weakness. ? Dark yellow urine or a decrease in urination. ? Constipation. Some people do not develop symptoms, but they can still infect other people. How is this diagnosed? This condition is diagnosed based on:  Your medical history.  Your symptoms.  A physical exam.  Your travel history.  Tests, including: ? A stool sample test. You may need to give several stool samples for a few days. ? Blood tests. How is this treated? This condition is treated with antibiotic medicines or medicines that kill the parasite (antiparasitics). If you are pregnant, you may need to take different medicines. For mild symptoms, you may not need medicine. It is important to stay hydrated. It is important to discuss treatment options with your health care provider. Follow these instructions at home: Medicines  Take over-the-counter and prescription medicines  only as told by your health care provider.  If you were prescribed an antibiotic medicine, take it as told by your health care provider. Do not stop taking the antibiotic even if your condition improves. This is important. Eating and drinking  Drink enough fluid to keep your urine pale yellow. Watch for any symptoms of dehydration.  Follow your health  care provider's instructions about what you can or cannot eat or drink, such as dairy products. General instructions  Always wash your hands after going to the bathroom or changing diapers, and before preparing food. Use soap and water when washing your hands.  Children with symptoms should not go to daycare or school until symptoms have gone away.  Do not swimin a public swimming pool, hot tub, or lake until at least a week after symptoms have gone away.  Keep all follow-up visits as told by your health care provider. This is important.   How is this prevented?  Boil your water or drink bottled water in areas of giardiasis contamination.  Do not eat ice that was made from an unclean water supply. Do not drink beverages with ice that was made from an unclean water supply.  Do not swallow water while swimming in pools, hot tubs, ponds, lakes, or the ocean.  Use safe, clean water to wash all foods.  Avoid eating raw or undercooked foods from areas with unclean water supplies.  Always wash your hands after going to the bathroom, changing diapers, or gardening, and before preparing food. Use soap and water when washing your hands.  Practice safe sex and use a condom if you have anal sex.  Clean and disinfect areas in the house that may have been contaminated with feces from an infected person. These include hard surfaces, furniture, bedding, and toys.   Contact a health care provider if:  Your symptoms get worse.  Your symptoms come back after you finish treatment.  You have symptoms of dehydration, including: ? Dry mouth and sticky tongue. ? Headaches. ? Dizziness. ? Irritability. ? Dry skin. ? Weakness. ? Dark yellow urine or a decrease in urination. ? Constipation.  You have a fever.  You keep losing weight after treatment.  You have joint pain. Get help right away if:  You have blood in your feces.  You have symptoms of severe dehydration, including: ? Rapid  heartbeat. ? Sunken eyes. ? Inability to sweat or produce tears. ? No urination for more than 8 hours. ? Extreme thirst. ? Low blood pressure. ? Confusion. ? Fever. Summary  Giardiasis is an infection of the small intestine with the parasite Giardia.  This parasite is often found in unclean (contaminated) water or areas with poor sanitation.  You can develop this condition after swallowing the parasite.  This condition is treated with antibiotic medicines or medicines that kill the parasite (antiparasitics).  Handwashing is an important part of preventing giardiasis. Always wash your hands after going to the bathroom, changing diapers, or gardening, and before preparing food. Use soap and water when washing your hands. This information is not intended to replace advice given to you by your health care provider. Make sure you discuss any questions you have with your health care provider. Document Revised: 03/08/2018 Document Reviewed: 12/01/2017 Elsevier Patient Education  2021 [employer].

## 2020-04-25 ENCOUNTER — Telehealth: Payer: Self-pay

## 2020-04-25 ENCOUNTER — Telehealth: Payer: Self-pay | Admitting: *Deleted

## 2020-04-25 NOTE — Telephone Encounter (Signed)
Transition Care Management Follow-up Telephone Call  Date of discharge and from where: 04/24/2020, [facility]   How have you been since you were released from the hospital?he said that he feels okay and he thinks the antibiotics are working.   Any questions or concerns? Yes - he reports constant numbness and tingling of both great toes.  This started 01/22/2020 and he reported this to Dr Julienne Kass along with left foot drop. He is not experiencing the foot drop at this time. He also noted that sometimes he is not able to bend his right foot and this has caused difficulty walking and resulted in a fall at one point.  He said that he did not go to the ED as he was advised by Dr Margarita Rana 01/22/2020.Marland Kitchen  He also reports random swelling of his feel and ankles. He said that the swelling then resolves on its own. When swollen, the feet are tender to touch.  They are not swollen now. Explained to him that his concerns would be shared with Dr Margarita Rana.   Items Reviewed:  Did the pt receive and understand the discharge instructions provided? Yes   Medications obtained and verified? Yes  - he has all medications and did not have any questions about his med regime.   Other? No   Any new allergies since your discharge? No   Do you have support at home? Yes , his mom, son   Home Care and Equipment/Supplies: Were home health services ordered? no If so, what is the name of the agency? n/a  Has the agency set up a time to come to the patient's home? not applicable Were any new equipment or medical supplies ordered?  No What is the name of the medical supply agency? n/a Were you able to get the supplies/equipment? not applicable Do you have any questions related to the use of the equipment or supplies? No  Functional Questionnaire: (I = Independent and D = Dependent) ADLs: independent  Follow up appointments reviewed:   PCP Hospital f/u appt confirmed? Yes  - scheduled appointment with Dr Margarita Rana   05/02/2020.   [facility] f/u appt confirmed? Yes  - [facility] 06/07/2020   Are transportation arrangements needed? No   If their condition worsens, is the pt aware to call PCP or go to the Emergency Dept.? Yes  Was the patient provided with contact information for the PCP's office or ED? Yes  Was to pt encouraged to call back with questions or concerns? Yes, he also uses MyChart

## 2020-04-25 NOTE — Telephone Encounter (Signed)
Transition Care Management Follow-up Telephone Call  Date of discharge and from where: 04-24-2020  [facility]  How have you been since you were released from the hospital? Feeling better little tired  Any questions or concerns?  NO  Items Reviewed:  Did the pt receive and understand the discharge instructions provided? Yes   Medications obtained and verified? Yes   Other? No   Any new allergies since your discharge? No   Dietary orders reviewed? n/a  Do you have support at home? Yes    Functional Questionnaire: (I = Independent and D = Dependent) ADLs: I  Bathing/Dressing- I  Meal Prep- I  Eating- I  Maintaining continence- I  Transferring/Ambulation- I  Managing Meds- I  Follow up appointments reviewed:   PCP Hospital f/u appt confirmed? No  patient waiting on return call  [facility] f/u appt confirmed? Yes  Scheduled infectious disease 05-29-2020  Are transportation arrangements needed? No   If their condition worsens, is the pt aware to call PCP or go to the Emergency Dept.? Yes  Was the patient provided with contact information for the PCP's office or ED? yes  Was to pt encouraged to call back with questions or concerns? yes

## 2020-04-25 NOTE — Telephone Encounter (Signed)
From the discharge call.  Patient has appointment with Dr Margarita Rana 05/02/2020.     he said that he feels okay and he thinks the antibiotics are working. he has all of his medications.    he reports constant numbness and tingling of both great toes. This started 01/22/2020 and he reported this to Dr Julienne Kass along with left foot drop. He is not experiencing the foot drop at this time. He also noted that sometimes he is not able to bend his right foot and this has caused difficulty walking and resulted in a fall at one point.  He said that he did not go to the ED as he was advised by Dr Margarita Rana 01/22/2020.   He also reports random swelling of his feel and ankles. He said that the swelling then resolves on its own. When swollen, the feet are tender to touch.  They are not swollen now. Explained to him that his concerns would be shared with Dr Margarita Rana.

## 2020-04-26 NOTE — Telephone Encounter (Signed)
We discussed his symptoms at his last visit and he had declined treatment with a diuretic.  I will address this at his upcoming visit.  Thank you

## 2020-04-29 NOTE — Telephone Encounter (Signed)
Attempted to contact patient # [phone]  to inform him that Dr Eloisa Northern will discuss his current symptoms with him at his upcoming visit.  Message left with call back requested to this CM.

## 2020-05-02 ENCOUNTER — Ambulatory Visit: Payer: Medicaid Other | Admitting: Family Medicine

## 2020-05-23 ENCOUNTER — Other Ambulatory Visit: Payer: Self-pay

## 2020-05-23 DIAGNOSIS — B20 Human immunodeficiency virus [HIV] disease: Secondary | ICD-10-CM

## 2020-05-23 DIAGNOSIS — Z79899 Other long term (current) drug therapy: Secondary | ICD-10-CM

## 2020-05-23 DIAGNOSIS — Z113 Encounter for screening for infections with a predominantly sexual mode of transmission: Secondary | ICD-10-CM

## 2020-05-24 LAB — URINE CYTOLOGY ANCILLARY ONLY
Chlamydia: NEGATIVE
Comment: NEGATIVE
Comment: NORMAL
Neisseria Gonorrhea: NEGATIVE

## 2020-05-26 LAB — COMPREHENSIVE METABOLIC PANEL
AG Ratio: 1.5 (calc) (ref 1.0–2.5)
ALT: 21 U/L (ref 9–46)
AST: 15 U/L (ref 10–35)
Albumin: 4.4 g/dL (ref 3.6–5.1)
Alkaline phosphatase (APISO): 59 U/L (ref 35–144)
BUN: 11 mg/dL (ref 7–25)
CO2: 24 mmol/L (ref 20–32)
Calcium: 9.4 mg/dL (ref 8.6–10.3)
Chloride: 106 mmol/L (ref 98–110)
Creat: 1.27 mg/dL (ref 0.70–1.33)
Globulin: 2.9 g/dL (calc) (ref 1.9–3.7)
Glucose, Bld: 109 mg/dL — ABNORMAL HIGH (ref 65–99)
Potassium: 4.5 mmol/L (ref 3.5–5.3)
Sodium: 139 mmol/L (ref 135–146)
Total Bilirubin: 0.4 mg/dL (ref 0.2–1.2)
Total Protein: 7.3 g/dL (ref 6.1–8.1)

## 2020-05-26 LAB — LIPID PANEL
Cholesterol: 213 mg/dL — ABNORMAL HIGH (ref <200)
HDL: 58 mg/dL (ref >=40)
LDL Cholesterol (Calc): 130 mg/dL — ABNORMAL HIGH
Non-HDL Cholesterol (Calc): 155 mg/dL — ABNORMAL HIGH (ref <130)
Total CHOL/HDL Ratio: 3.7 (calc) (ref <5.0)
Triglycerides: 139 mg/dL (ref <150)

## 2020-05-26 LAB — HIV-1 RNA QUANT-NO REFLEX-BLD
HIV 1 RNA Quant: NOT DETECTED Copies/mL
HIV-1 RNA Quant, Log: NOT DETECTED Log cps/mL

## 2020-05-26 LAB — SYPHILIS: RPR W/REFLEX TO RPR TITER AND TREPONEMAL ANTIBODIES, TRADITIONAL SCREENING AND DIAGNOSIS ALGORITHM: RPR Ser Ql: NONREACTIVE

## 2020-06-07 ENCOUNTER — Encounter: Payer: Self-pay | Admitting: Family

## 2020-06-07 ENCOUNTER — Ambulatory Visit (INDEPENDENT_AMBULATORY_CARE_PROVIDER_SITE_OTHER): Payer: Self-pay | Admitting: Family

## 2020-06-07 ENCOUNTER — Other Ambulatory Visit: Payer: Self-pay

## 2020-06-07 VITALS — BP 136/102 | HR 76 | Temp 97.5°F | Wt 150.0 lb

## 2020-06-07 DIAGNOSIS — Z113 Encounter for screening for infections with a predominantly sexual mode of transmission: Secondary | ICD-10-CM

## 2020-06-07 DIAGNOSIS — B20 Human immunodeficiency virus [HIV] disease: Secondary | ICD-10-CM

## 2020-06-07 DIAGNOSIS — Z21 Asymptomatic human immunodeficiency virus [HIV] infection status: Secondary | ICD-10-CM

## 2020-06-07 DIAGNOSIS — Z0000 Encounter for general adult medical examination without abnormal findings: Secondary | ICD-10-CM

## 2020-06-07 MED ORDER — BICTEGRAVIR-EMTRICITAB-TENOFOV 50-200-25 MG PO TABS: 1.0000 | ORAL_TABLET | Freq: Every day | ORAL | 4 refills | Status: DC

## 2020-06-07 NOTE — Patient Instructions (Addendum)
Nice to see you.  Blood work looks good.   Continue to take your medication daily.   Refills have been sent to the pharmacy.  Plan for follow up 4 months or sooner if needed with lab work 1-2 weeks prior to appointment.   Have a great day and stay safe!

## 2020-06-07 NOTE — Progress Notes (Signed)
Patient ID: Ryan Cruz, male    DOB: 1965-12-26, 55 y.o.   MRN: [id number]   Subjective:    Chief Complaint  Patient presents with  . Follow-up    B20     HPI:  Ryan Cruz is a 55 y.o. male with HIV disease last seen for HIV on 03/11/2020 with well-controlled virus and good adherence and tolerance to his ART regimen of Biktarvy.  Viral load at the time was undetectable with CD4 count of 509.  Most recent lab work completed on 05/23/2020 with viral load that remains undetectable and kidney function, liver function, electrolytes within normal ranges.  Glucose mildly elevated at 109.  HDL of 58, LDL 130, and triglycerides of 139.  STI testing negative for gonorrhea, chlamydia, and syphilis.  Here today for routine follow-up.  Mr. Borboa continues to take his Biktarvy daily as prescribed with no adverse side effects or missed doses since his last office visit.  Overall feeling well today with no new concerns/complaints. Denies fevers, chills, night sweats, headaches, changes in vision, neck pain/stiffness, nausea, diarrhea, vomiting, lesions or rashes.  Mr. Xiang is a problems obtaining medication from the pharmacy remains covered through [place].  Denies feelings of being down, depressed, or hopeless recently.  Continues to smoke marijuana daily and approximately 1/4 pack of cigarettes per day.  Alcohol consumption on occasion.  Declines condoms.  Due for routine dental care.  Just moved into a new house and is excited for the potential.  Allergies  Allergen Reactions  . Doxycycline Rash      Outpatient Medications Prior to Visit  Medication Sig Dispense Refill  . acetaminophen (TYLENOL) 325 MG tablet Take 650 mg by mouth every 6 (six) hours as needed.    . Cyanocobalamin (VITAMIN B 12 PO) Take 1 tablet by mouth daily.    Marland Kitchen omeprazole (PRILOSEC) 20 MG capsule TAKE 1 CAPSULE(20 MG) BY MOUTH DAILY 30 capsule 1  . bictegravir-emtricitabine-tenofovir AF (BIKTARVY) 50-200-25 MG TABS tablet  Take 1 tablet by mouth daily. 30 tablet 4  . metroNIDAZOLE (FLAGYL) 500 MG tablet TAKE 1 TABLET (500 MG TOTAL) BY MOUTH EVERY 12 (TWELVE) HOURS FOR 4 DAYS. (Patient not taking: Reported on 06/07/2020) 8 tablet 0   No facility-administered medications prior to visit.     Past Medical History:  Diagnosis Date  . Anxiety   . Asthma   . Depression   . HIV (human immunodeficiency virus infection) ([place])   . Hypertension   . Hypoglycemia    sugars drop ion     Past Surgical History:  Procedure Laterality Date  . BIOPSY  08/18/2019   Procedure: BIOPSY;  Surgeon: Jackquline Denmark, MD;  Location: [facility] ENDOSCOPY;  Service: Endoscopy;;  . COLONOSCOPY  02/12/2012   Procedure: COLONOSCOPY;  Surgeon: Leighton Ruff, MD;  Location: [facility] ENDOSCOPY;  Service: Endoscopy;  Laterality: N/A;  . ESOPHAGOGASTRODUODENOSCOPY (EGD) WITH PROPOFOL N/A 08/18/2019   Procedure: ESOPHAGOGASTRODUODENOSCOPY (EGD) WITH PROPOFOL;  Surgeon: Jackquline Denmark, MD;  Location: [facility] ENDOSCOPY;  Service: Endoscopy;  Laterality: N/A;  . HERNIA REPAIR     umbilibcal       Review of Systems  Constitutional: Negative for appetite change, chills, fatigue, fever and unexpected weight change.  Eyes: Negative for visual disturbance.  Respiratory: Negative for cough, chest tightness, shortness of breath and wheezing.   Cardiovascular: Negative for chest pain and leg swelling.  Gastrointestinal: Negative for abdominal pain, constipation, diarrhea, nausea and vomiting.  Genitourinary: Negative for dysuria, flank pain, frequency,  genital sores, hematuria and urgency.  Skin: Negative for rash.  Allergic/Immunologic: Negative for immunocompromised state.  Neurological: Negative for dizziness and headaches.      Objective:    BP (!) 136/102   Pulse 76   Temp (!) 97.5 F (36.4 C) (Oral)   Wt 150 lb (68 kg)   BMI 23.49 kg/m  Nursing note and vital signs reviewed.  Physical Exam Constitutional:      General: He is not in acute distress.     Appearance: He is well-developed.  Eyes:     Conjunctiva/sclera: Conjunctivae normal.  Cardiovascular:     Rate and Rhythm: Normal rate and regular rhythm.     Heart sounds: Normal heart sounds. No murmur heard. No friction rub. No gallop.   Pulmonary:     Effort: Pulmonary effort is normal. No respiratory distress.     Breath sounds: Normal breath sounds. No wheezing or rales.  Chest:     Chest wall: No tenderness.  Abdominal:     General: Bowel sounds are normal.     Palpations: Abdomen is soft.     Tenderness: There is no abdominal tenderness.  Musculoskeletal:     Cervical back: Neck supple.  Lymphadenopathy:     Cervical: No cervical adenopathy.  Skin:    General: Skin is warm and dry.     Findings: No rash.  Neurological:     Mental Status: He is alert and oriented to person, place, and time.  Psychiatric:        Behavior: Behavior normal.        Thought Content: Thought content normal.        Judgment: Judgment normal.      Depression screen [facility] 2/9 06/07/2020 11/21/2019 09/26/2019 09/05/2019 06/05/2019  Decreased Interest 0 1 1 2  0  Down, Depressed, Hopeless 0 1 1 2  0  PHQ - 2 Score 0 2 2 4  0  Altered sleeping - 0 - 2 -  Tired, decreased energy - 0 - 1 -  Change in appetite - 1 - 0 -  Feeling bad or failure about yourself  - 0 - 1 -  Trouble concentrating - 1 - 2 -  Moving slowly or fidgety/restless - 0 - 2 -  Suicidal thoughts - 0 - 2 -  PHQ-9 Score - 4 - 14 -  Difficult doing work/chores - Somewhat difficult - - -  Some recent data might be hidden       Assessment & Plan:    Patient Active Problem List   Diagnosis Date Noted  . 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 Protein-calorie malnutrition, severe 04/22/2020  . Diarrhea 04/21/2020  . Acute diffuse otitis externa of left ear 03/15/2020  . Numbness of feet 03/11/2020  . Intractable vomiting with nausea   . Nausea vomiting and diarrhea   . AKI (acute kidney injury) ([place]) 08/14/2019  . Foot lesion 06/05/2019  .  Healthcare maintenance 04/26/2018  . Hepatitis B immune 11/18/2016  . Medically noncompliant   . 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 Other headache syndrome   . Optic neuritis 04/16/2016  . Chest pain 11/20/2015  . Hypoglycemia 01/29/2014  . Allergic sinusitis 06/29/2012  . Pilonidal cyst 01/04/2012  . Depression 06/25/2010  . 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 Essential hypertension 12/12/2009  . Ventral hernia 03/28/2009  . NEUROPATHY, NOS 04/30/2008  . DENTAL CARIES 12/14/2007  . TOBACCO USER 06/01/2007  . VERTIGO 05/13/2007  . HIV  infection ([place]) 02/24/2006  . HYPERLIPIDEMIA 02/24/2006  . Rectal bleeding 11/19/2005  . ASTHMA 12/11/1994     Problem List Items Addressed This Visit      Other   HIV infection ([place]) - Primary    Mr. Putt continues to have well-controlled HIV disease with good adherence and tolerance to his ART regimen of Biktarvy.  No signs/symptoms of opportunistic infection or progressive HIV.  We reviewed lab work and discussed plan of care.  Continue current dose of Biktarvy.  Plan for follow-up in 4 months or sooner if needed with lab work 1 to 2 weeks prior to appointment.      Relevant Medications   bictegravir-emtricitabine-tenofovir AF (BIKTARVY) 50-200-25 MG TABS tablet   Healthcare maintenance     Discussed importance of safe sexual practice to reduce risk of STI.  Condoms declined.  Due for routine dental care which she will schedule independently.  Discussed/recommended COVID vaccines.  All other vaccines are up-to-date per recommendations.          I am having Savona maintain his Cyanocobalamin (VITAMIN B 12 PO), acetaminophen, omeprazole, bictegravir-emtricitabine-tenofovir AF, and metroNIDAZOLE.   Meds ordered this encounter  Medications  . bictegravir-emtricitabine-tenofovir AF (BIKTARVY) 50-200-25 MG TABS tablet    Sig: Take 1 tablet by mouth daily.    Dispense:  30 tablet     Refill:  4    Order Specific Question:   Supervising Provider    Answer:   Carlyle Basques [[id number]]     Follow-up: Return in about 4 months (around 10/07/2020), or if symptoms worsen or fail to improve.   Terri Piedra, MSN, FNP-C Nurse Practitioner [facility] [place] number: [phone]

## 2020-06-07 NOTE — Assessment & Plan Note (Signed)
Ryan Cruz continues to have well-controlled HIV disease with good adherence and tolerance to his ART regimen of Biktarvy.  No signs/symptoms of opportunistic infection or progressive HIV.  We reviewed lab work and discussed plan of care.  Continue current dose of Biktarvy.  Plan for follow-up in 4 months or sooner if needed with lab work 1 to 2 weeks prior to appointment.

## 2020-06-07 NOTE — Assessment & Plan Note (Signed)
   Discussed importance of safe sexual practice to reduce risk of STI.  Condoms declined.  Due for routine dental care which she will schedule independently.  Discussed/recommended COVID vaccines.  All other vaccines are up-to-date per recommendations.

## 2020-07-05 ENCOUNTER — Ambulatory Visit (INDEPENDENT_AMBULATORY_CARE_PROVIDER_SITE_OTHER): Payer: Medicaid Other | Admitting: Podiatry

## 2020-07-05 DIAGNOSIS — Z5329 Procedure and treatment not carried out because of patient's decision for other reasons: Secondary | ICD-10-CM

## 2020-07-05 NOTE — Progress Notes (Signed)
No show for appt. 

## 2020-07-24 ENCOUNTER — Encounter: Payer: Self-pay | Admitting: Family

## 2020-07-31 ENCOUNTER — Telehealth: Payer: Self-pay | Admitting: Family Medicine

## 2020-07-31 ENCOUNTER — Other Ambulatory Visit: Payer: Self-pay

## 2020-07-31 ENCOUNTER — Encounter: Payer: Self-pay | Admitting: Family Medicine

## 2020-07-31 ENCOUNTER — Ambulatory Visit: Payer: Self-pay | Attending: Family Medicine | Admitting: Family Medicine

## 2020-07-31 VITALS — BP 133/74 | HR 72 | Ht 67.0 in | Wt 150.4 lb

## 2020-07-31 DIAGNOSIS — B20 Human immunodeficiency virus [HIV] disease: Secondary | ICD-10-CM

## 2020-07-31 DIAGNOSIS — F411 Generalized anxiety disorder: Secondary | ICD-10-CM

## 2020-07-31 DIAGNOSIS — M21372 Foot drop, left foot: Secondary | ICD-10-CM

## 2020-07-31 DIAGNOSIS — I10 Essential (primary) hypertension: Secondary | ICD-10-CM

## 2020-07-31 DIAGNOSIS — R531 Weakness: Secondary | ICD-10-CM

## 2020-07-31 DIAGNOSIS — G629 Polyneuropathy, unspecified: Secondary | ICD-10-CM

## 2020-07-31 DIAGNOSIS — E7800 Pure hypercholesterolemia, unspecified: Secondary | ICD-10-CM

## 2020-07-31 MED ORDER — ATORVASTATIN CALCIUM 20 MG PO TABS: 20.0000 mg | ORAL_TABLET | Freq: Every day | ORAL | 6 refills | Status: DC

## 2020-07-31 MED ORDER — ASPIRIN EC 81 MG PO TBEC: 81.0000 mg | DELAYED_RELEASE_TABLET | Freq: Every day | ORAL | 6 refills | Status: DC

## 2020-07-31 MED ORDER — AMLODIPINE BESYLATE 2.5 MG PO TABS: 2.5000 mg | ORAL_TABLET | Freq: Every day | ORAL | 6 refills | Status: DC

## 2020-07-31 NOTE — Patient Instructions (Signed)
High Cholesterol  High cholesterol is a condition in which the blood has high levels of a white, waxy substance similar to fat (cholesterol). The liver makes all the cholesterol that the body needs. The human body needs small amounts of cholesterol to help build cells. A person gets extra orexcess cholesterol from the food that he or she eats. The blood carries cholesterol from the liver to the rest of the body. If you have high cholesterol, deposits (plaques) may build up on the walls of your arteries. Arteries are the blood vessels that carry blood away from your heart. These plaques make the arteries narrowand stiff. Cholesterol plaques increase your risk for heart attack and stroke. Work withyour health care provider to keep your cholesterol levels in a healthy range. What increases the risk? The following factors may make you more likely to develop this condition: Eating foods that are high in animal fat (saturated fat) or cholesterol. Being overweight. Not getting enough exercise. A family history of high cholesterol (familial hypercholesterolemia). Use of tobacco products. Having diabetes. What are the signs or symptoms? There are no symptoms of this condition. How is this diagnosed? This condition may be diagnosed based on the results of a blood test. If you are older than 53 years of age, your health care provider may check your cholesterol levels every 4-6 years. You may be checked more often if you have high cholesterol or other risk factors for heart disease. The blood test for cholesterol measures: "Bad" cholesterol, or LDL cholesterol. This is the main type of cholesterol that causes heart disease. The desired level is less than 100 mg/dL. "Good" cholesterol, or HDL cholesterol. HDL helps protect against heart disease by cleaning the arteries and carrying the LDL to the liver for processing. The desired level for HDL is 60 mg/dL or higher. Triglycerides. These are fats that your  body can store or burn for energy. The desired level is less than 150 mg/dL. Total cholesterol. This measures the total amount of cholesterol in your blood and includes LDL, HDL, and triglycerides. The desired level is less than 200 mg/dL. How is this treated? This condition may be treated with: Diet changes. You may be asked to eat foods that have more fiber and less saturated fats or added sugar. Lifestyle changes. These may include regular exercise, maintaining a healthy weight, and quitting use of tobacco products. Medicines. These are given when diet and lifestyle changes have not worked. You may be prescribed a statin medicine to help lower your cholesterol levels. Follow these instructions at home: Eating and drinking  Eat a healthy, balanced diet. This diet includes: Daily servings of a variety of fresh, frozen, or canned fruits and vegetables. Daily servings of whole grain foods that are rich in fiber. Foods that are low in saturated fats and trans fats. These include poultry and fish without skin, lean cuts of meat, and low-fat dairy products. A variety of fish, especially oily fish that contain omega-3 fatty acids. Aim to eat fish at least 2 times a week. Avoid foods and drinks that have added sugar. Use healthy cooking methods, such as roasting, grilling, broiling, baking, poaching, steaming, and stir-frying. Do not fry your food except for stir-frying.  Lifestyle  Get regular exercise. Aim to exercise for a total of 150 minutes a week. Increase your activity level by doing activities such as gardening, walking, and taking the stairs. Do not use any products that contain nicotine or tobacco, such as cigarettes, e-cigarettes, and chewing tobacco.   If you need help quitting, ask your health care provider.  General instructions Take over-the-counter and prescription medicines only as told by your health care provider. Keep all follow-up visits as told by your health care provider.  This is important. Where to find more information American Heart Association: www.heart.org National Heart, Lung, and Blood Institute: www.nhlbi.nih.gov Contact a health care provider if: You have trouble achieving or maintaining a healthy diet or weight. You are starting an exercise program. You are unable to stop smoking. Get help right away if: You have chest pain. You have trouble breathing. You have any symptoms of a stroke. "BE FAST" is an easy way to remember the main warning signs of a stroke: B - Balance. Signs are dizziness, sudden trouble walking, or loss of balance. E - Eyes. Signs are trouble seeing or a sudden change in vision. F - Face. Signs are sudden weakness or numbness of the face, or the face or eyelid drooping on one side. A - Arms. Signs are weakness or numbness in an arm. This happens suddenly and usually on one side of the body. S - Speech. Signs are sudden trouble speaking, slurred speech, or trouble understanding what people say. T - Time. Time to call emergency services. Write down what time symptoms started. You have other signs of a stroke, such as: A sudden, severe headache with no known cause. Nausea or vomiting. Seizure. These symptoms may represent a serious problem that is an emergency. Do not wait to see if the symptoms will go away. Get medical help right away. Call your local emergency services (911 in the [place] Do not drive yourself to the hospital. Summary Cholesterol plaques increase your risk for heart attack and stroke. Work with your health care provider to keep your cholesterol levels in a healthy range. Eat a healthy, balanced diet, get regular exercise, and maintain a healthy weight. Do not use any products that contain nicotine or tobacco, such as cigarettes, e-cigarettes, and chewing tobacco. Get help right away if you have any symptoms of a stroke. This information is not intended to replace advice given to you by your health care  provider. Make sure you discuss any questions you have with your healthcare provider. Document Revised: 12/26/2018 Document Reviewed: 12/26/2018 Elsevier Patient Education  2022 Elsevier Inc.  

## 2020-07-31 NOTE — Telephone Encounter (Signed)
Can you please assist with counseling resources for anxiety?  Thank you

## 2020-07-31 NOTE — Progress Notes (Signed)
Subjective:  Patient ID: Ryan Cruz, male    DOB: 03-Apr-1965  Age: 55 y.o. MRN: [id number]  CC: Hypertension   HPI Ryan Cruz is a 55 year old male with a history of HIV, hypertension, hypercholesterolemia here for follow-up visit.  Interval History: He had sinus issues which resolved after he changed his filters and stopped burning candles. His BP was running in the 200s/100 then normalized.previously on an antihypertensive but he seemed this was discontinued during one of his hospitalizations.  Noticed foot drop in his L foot in 01/2020 for which I had recommended he go to the ED for evaluation for stroke but he never did.  He got a foot brace which helped his symptoms but surprisingly this reoccurred last week again and resolved spontaneously. BP is normal today and he has no foot drop at the moment. He has no slurred speech but has noticed dropping things from his L hand and sometimes is unable to feel the tip of his fingers; has no gait abnormalities.  Abnormalities. He also sometimes forgets epsecially Doctor appointments Tip of his toes feel cold - like ice he complains.  Complains of anxiety over the smallest things but has no suicidal ideations or intents and no dysphoric mood. Past Medical History:  Diagnosis Date   Anxiety    Asthma    Depression    HIV (human immunodeficiency virus infection) ([place])    Hypertension    Hypoglycemia    sugars drop ion    Past Surgical History:  Procedure Laterality Date   BIOPSY  08/18/2019   Procedure: BIOPSY;  Surgeon: Jackquline Denmark, MD;  Location: [facility] ENDOSCOPY;  Service: Endoscopy;;   COLONOSCOPY  02/12/2012   Procedure: COLONOSCOPY;  Surgeon: Leighton Ruff, MD;  Location: [facility] ENDOSCOPY;  Service: Endoscopy;  Laterality: N/A;   ESOPHAGOGASTRODUODENOSCOPY (EGD) WITH PROPOFOL N/A 08/18/2019   Procedure: ESOPHAGOGASTRODUODENOSCOPY (EGD) WITH PROPOFOL;  Surgeon: Jackquline Denmark, MD;  Location: [facility] ENDOSCOPY;  Service: Endoscopy;  Laterality:  N/A;   HERNIA REPAIR     umbilibcal    Family History  Problem Relation Age of Onset   Diabetes Mother    Hypertension Mother    Heart disease Mother    Congestive Heart Failure Mother    Diabetes Father    Hypertension Father    Diabetes Sister    HIV Sister    Hypertension Sister    Diabetes Brother    Hypertension Brother    Diabetes Brother    Hypertension Brother    Diabetes Brother    Hypertension Brother    Diabetes Brother    Hypertension Brother     Allergies  Allergen Reactions   Doxycycline Rash    Outpatient Medications Prior to Visit  Medication Sig Dispense Refill   acetaminophen (TYLENOL) 325 MG tablet Take 650 mg by mouth every 6 (six) hours as needed.     bictegravir-emtricitabine-tenofovir AF (BIKTARVY) 50-200-25 MG TABS tablet Take 1 tablet by mouth daily. 30 tablet 4   Cyanocobalamin (VITAMIN B 12 PO) Take 1 tablet by mouth daily.     omeprazole (PRILOSEC) 20 MG capsule TAKE 1 CAPSULE(20 MG) BY MOUTH DAILY 30 capsule 1   metroNIDAZOLE (FLAGYL) 500 MG tablet TAKE 1 TABLET (500 MG TOTAL) BY MOUTH EVERY 12 (TWELVE) HOURS FOR 4 DAYS. (Patient not taking: No sig reported) 8 tablet 0   No facility-administered medications prior to visit.     ROS Review of Systems  Constitutional:  Negative for activity change and appetite change.  HENT:  Negative for sinus pressure and sore throat.   Eyes:  Negative for visual disturbance.  Respiratory:  Negative for cough, chest tightness and shortness of breath.   Cardiovascular:  Negative for chest pain and leg swelling.  Gastrointestinal:  Negative for abdominal distention, abdominal pain, constipation and diarrhea.  Endocrine: Negative.   Genitourinary:  Negative for dysuria.  Musculoskeletal:  Negative for joint swelling and myalgias.  Skin:  Negative for rash.  Allergic/Immunologic: Negative.   Neurological:  Negative for weakness, light-headedness and numbness.  Psychiatric/Behavioral:  Negative for  dysphoric mood and suicidal ideas.    Objective:  BP 133/74   Pulse 72   Ht 5\' 7"  (1.702 m)   Wt 150 lb 6.4 oz (68.2 kg)   SpO2 99%   BMI 23.56 kg/m   BP/Weight 07/31/2020 06/07/2020 10/10/5174  Systolic BP 160 737 106  Diastolic BP 74 269 87  Wt. (Lbs) 150.4 150 -  BMI 23.56 23.49 -      Physical Exam Constitutional:      Appearance: He is well-developed.  Neck:     Vascular: No JVD.  Cardiovascular:     Rate and Rhythm: Normal rate.     Heart sounds: Normal heart sounds. No murmur heard. Pulmonary:     Effort: Pulmonary effort is normal.     Breath sounds: Normal breath sounds. No wheezing or rales.  Chest:     Chest wall: No tenderness.  Abdominal:     General: Bowel sounds are normal. There is no distension.     Palpations: Abdomen is soft. There is no mass.     Tenderness: There is no abdominal tenderness.  Musculoskeletal:        General: Normal range of motion.     Right lower leg: No edema.     Left lower leg: No edema.  Neurological:     Mental Status: He is alert and oriented to person, place, and time.     Motor: No weakness.     Coordination: Coordination normal.     Gait: Gait normal.     Comments: Normal strength in all extremities  Psychiatric:        Mood and Affect: Mood normal.    CMP Latest Ref Rng & Units 05/23/2020 04/24/2020 04/23/2020  Glucose 65 - 99 mg/dL 109(H) 98 106(H)  BUN 7 - 25 mg/dL 11 8 6   Creatinine 0.70 - 1.33 mg/dL 1.27 1.15 1.17  Sodium 135 - 146 mmol/L 139 137 135  Potassium 3.5 - 5.3 mmol/L 4.5 3.6 3.5  Chloride 98 - 110 mmol/L 106 110 111  CO2 20 - 32 mmol/L 24 21(L) 18(L)  Calcium 8.6 - 10.3 mg/dL 9.4 8.8(L) 8.7(L)  Total Protein 6.1 - 8.1 g/dL 7.3 - -  Total Bilirubin 0.2 - 1.2 mg/dL 0.4 - -  Alkaline Phos 38 - 126 U/L - - -  AST 10 - 35 U/L 15 - -  ALT 9 - 46 U/L 21 - -    Lipid Panel     Component Value Date/Time   CHOL 213 (H) 05/23/2020 0933   TRIG 139 05/23/2020 0933   HDL 58 05/23/2020 0933   CHOLHDL  3.7 05/23/2020 0933   VLDL 37 (H) 02/28/2015 1655   LDLCALC 130 (H) 05/23/2020 0933    CBC    Component Value Date/Time   WBC 9.5 04/22/2020 0445   RBC 3.80 (L) 04/22/2020 0445   HGB 12.5 (L) 04/22/2020 0445   HGB 11.8 (L) 09/05/2019  1142   HCT 37.0 (L) 04/22/2020 0445   HCT 35.2 (L) 09/05/2019 1142   PLT 286 04/22/2020 0445   PLT 284 09/05/2019 1142   MCV 97.4 04/22/2020 0445   MCV 97 09/05/2019 1142   MCH 32.9 04/22/2020 0445   MCHC 33.8 04/22/2020 0445   RDW 13.2 04/22/2020 0445   RDW 11.9 09/05/2019 1142   LYMPHSABS 2.5 09/05/2019 1142   MONOABS 0.8 08/15/2019 0449   EOSABS 0.1 09/05/2019 1142   BASOSABS 0.0 09/05/2019 1142    Lab Results  Component Value Date   HGBA1C 5.4 03/11/2020    The 10-year ASCVD risk score Mikey Bussing DC Jr., et al., 2013) is: 17.5%   Values used to calculate the score:     Age: 68 years     Sex: Male     Is Non-Hispanic African American: Yes     Diabetic: No     Tobacco smoker: Yes     Systolic Blood Pressure: 338 mmHg     Is BP treated: Yes     HDL Cholesterol: 58 mg/dL     Total Cholesterol: 213 mg/dL  Assessment & Plan:  1. Hypercholesteremia Uncontrolled He has not been on a statin and I have initiated this Low-cholesterol diet - atorvastatin (LIPITOR) 20 MG tablet; Take 1 tablet (20 mg total) by mouth daily.  Dispense: 30 tablet; Refill: 6  2. Neuropathy This could explain the symptoms he has in his toes Offered option of gabapentin and Cymbalta and he declines both  3. Essential hypertension Blood pressures controlled at the moment but he has had elevated home blood pressures We will place on low-dose amlodipine as he was previously on antihypertensives. Counseled on blood pressure goal of less than 130/80, low-sodium, DASH diet, medication compliance, 150 minutes of moderate intensity exercise per week. Discussed medication compliance, adverse effects. - amLODipine (NORVASC) 2.5 MG tablet; Take 1 tablet (2.5 mg total) by  mouth daily.  Dispense: 30 tablet; Refill: 6  4. Human immunodeficiency virus (HIV) disease ([place]) Currently on antiretroviral therapy  5. GAD (generalized anxiety disorder) He would love counseling declines medications at this time Will refer to social worker to connect him with counseling resources  6. Weakness Will need to exclude previous TIA or stroke in the light of elevated blood pressures and left foot drop We will commence aspirin prophylaxis - aspirin EC 81 MG tablet; Take 1 tablet (81 mg total) by mouth daily. Swallow whole.  Dispense: 30 tablet; Refill: 6 - CT Head Wo Contrast; Future  7. Left foot drop See #6 above - CT Head Wo Contrast; Future    Meds ordered this encounter  Medications   aspirin EC 81 MG tablet    Sig: Take 1 tablet (81 mg total) by mouth daily. Swallow whole.    Dispense:  30 tablet    Refill:  6   amLODipine (NORVASC) 2.5 MG tablet    Sig: Take 1 tablet (2.5 mg total) by mouth daily.    Dispense:  30 tablet    Refill:  6   atorvastatin (LIPITOR) 20 MG tablet    Sig: Take 1 tablet (20 mg total) by mouth daily.    Dispense:  30 tablet    Refill:  6    Follow-up: Return in about 6 months (around 01/30/2021) for Chronic medical conditions.       Charlott Rakes, MD, FAAFP. [facility], [place]   07/31/2020, 6:15 PM

## 2020-08-01 ENCOUNTER — Telehealth: Payer: Self-pay

## 2020-08-01 NOTE — Telephone Encounter (Signed)
I will. Thank you!

## 2020-08-01 NOTE — Telephone Encounter (Signed)
Following up on a request from the patient's PCP on resources for anxiety. Called the patient and LVM for the patient to return a phone call to [facility].

## 2020-08-02 ENCOUNTER — Ambulatory Visit (HOSPITAL_COMMUNITY): Payer: Self-pay

## 2020-09-13 ENCOUNTER — Other Ambulatory Visit: Payer: Self-pay

## 2020-09-13 ENCOUNTER — Other Ambulatory Visit: Payer: Self-pay | Admitting: Family

## 2020-09-13 ENCOUNTER — Telehealth: Payer: Self-pay

## 2020-09-13 DIAGNOSIS — Z21 Asymptomatic human immunodeficiency virus [HIV] infection status: Secondary | ICD-10-CM

## 2020-09-13 NOTE — Telephone Encounter (Signed)
Patient here for labs, asks to speak to a nurse regarding throat pain that started Monday 09/09/20. He says he has a "scratchy feeling" in the back of his throat and that when he was brushing his teeth the other day he spit out blood.   He says it wasn't a lot, just some blood mixed in with spit. He also reports that his mouth "tastes funny" and his breath is bad as well.   RN assessed patients throat and did not see any blood or foreign bodies. Advised him to move up his follow up appointment with Terri Piedra, NP as there are no appointments available today. Instructed him to seek emergent care if he develops any concerning symptoms such as increased blood loss, difficulty breathing, increased pain. Patient verbalized understanding and has no further questions.   Beryle Flock, RN

## 2020-09-16 LAB — HIV-1 RNA QUANT-NO REFLEX-BLD
HIV 1 RNA Quant: <20 Copies/mL — ABNORMAL HIGH
HIV-1 RNA Quant, Log: <1.3 Log cps/mL — ABNORMAL HIGH

## 2020-09-16 LAB — COMPREHENSIVE METABOLIC PANEL
AG Ratio: 1.5 (calc) (ref 1.0–2.5)
ALT: 17 U/L (ref 9–46)
AST: 14 U/L (ref 10–35)
Albumin: 4.8 g/dL (ref 3.6–5.1)
Alkaline phosphatase (APISO): 62 U/L (ref 35–144)
BUN: 15 mg/dL (ref 7–25)
CO2: 22 mmol/L (ref 20–32)
Calcium: 9.7 mg/dL (ref 8.6–10.3)
Chloride: 107 mmol/L (ref 98–110)
Creat: 1.3 mg/dL (ref 0.70–1.30)
Globulin: 3.3 g/dL (calc) (ref 1.9–3.7)
Glucose, Bld: 93 mg/dL (ref 65–99)
Potassium: 4.3 mmol/L (ref 3.5–5.3)
Sodium: 137 mmol/L (ref 135–146)
Total Bilirubin: 0.5 mg/dL (ref 0.2–1.2)
Total Protein: 8.1 g/dL (ref 6.1–8.1)

## 2020-09-16 LAB — T-HELPER CELLS (CD4) COUNT (NOT AT ARMC)
Absolute CD4: 531 cells/uL (ref 490–1740)
CD4 T Helper %: 29 % — ABNORMAL LOW (ref 30–61)
Total lymphocyte count: 1811 cells/uL (ref 850–3900)

## 2020-09-19 ENCOUNTER — Ambulatory Visit: Payer: Medicaid Other | Admitting: Family

## 2020-09-24 ENCOUNTER — Encounter: Payer: Self-pay | Admitting: Family

## 2020-09-24 ENCOUNTER — Ambulatory Visit (INDEPENDENT_AMBULATORY_CARE_PROVIDER_SITE_OTHER): Payer: Self-pay | Admitting: Family

## 2020-09-24 ENCOUNTER — Other Ambulatory Visit: Payer: Self-pay

## 2020-09-24 VITALS — BP 139/90 | HR 61 | Temp 98.3°F | Wt 153.0 lb

## 2020-09-24 DIAGNOSIS — Z113 Encounter for screening for infections with a predominantly sexual mode of transmission: Secondary | ICD-10-CM

## 2020-09-24 DIAGNOSIS — Z21 Asymptomatic human immunodeficiency virus [HIV] infection status: Secondary | ICD-10-CM

## 2020-09-24 DIAGNOSIS — F331 Major depressive disorder, recurrent, moderate: Secondary | ICD-10-CM

## 2020-09-24 DIAGNOSIS — Z0000 Encounter for general adult medical examination without abnormal findings: Secondary | ICD-10-CM

## 2020-09-24 MED ORDER — BICTEGRAVIR-EMTRICITAB-TENOFOV 50-200-25 MG PO TABS: 1.0000 | ORAL_TABLET | Freq: Every day | ORAL | 4 refills | Status: DC

## 2020-09-24 NOTE — Assessment & Plan Note (Signed)
Ryan Cruz appears to be having an exacerbation of depression. No suicidal ideations or signs of psychosis. Recommended to make an appointment with our counselor. Discussed that medications may take time to work and agreed to try counseling first. Advised to seek further care if thoughts of suicide develop or symptoms acutely worsen.

## 2020-09-24 NOTE — Assessment & Plan Note (Signed)
[name] continues to have well controlled HIV disease with good adherence and tolerance to [employer]. No signs/symptoms of opportunistic infection. Reviewed lab work and discussed plan of care. Continue current dose of Biktarvy. Plan for follow up in 4 months or sooner if needed with lab work 1-2 weeks prior to appointment.

## 2020-09-24 NOTE — Progress Notes (Signed)
Brief Narrative   Patient ID: Ryan Cruz, male    DOB: [birth year], 55 y.o.   MRN: [id number]  Mr. Noteboom is a 55 y/o AA male diagnosed with HIV-1 disease with risk factor of MSM. Initial CD4 count and viral load are unavailable. No history of opportunistic infection. [id number] negative. Previous ART experience with Atripla, Vernell Leep, and currently Biktarvy.   Subjective:    Chief Complaint  Patient presents with   Follow-up    B20 - pt reports feeling a lot of fatigue lately and memory loss.     HPI:  Ryan Cruz is a 55 y.o. male with HIV disease last seen on 06/08/2018 with well-controlled virus and good adherence and tolerance to his ART regimen of Biktarvy.  Viral load was undetectable and CD4 count 509.  Most recent blood work completed on 09/13/2020 with viral load that remains undetectable and CD4 count of 531.  Kidney function, liver function, electrolytes within normal ranges.  Here today for routine follow-up.  Mr. Shampine continues to take his Biktarvy daily as prescribed with no adverse side effects. Has been having feelings of fatigue, decreased drive and increased irritability since his last office visit. Sleeping more. Denies fevers, chills, night sweats, headaches, changes in vision, neck pain/stiffness, nausea, diarrhea, vomiting, lesions or rashes.  Mr. Sarafin has no problems obtaining medication from the pharmacy and remains covered through [employer]. Has been having feelings of being down and denies suicidal ideation. Has previously been on Zoloft which did not help his symptoms and did not like the way it made him feel.   No energy, dive and feeling fatigued with unclear cause; Sleeping a lot more. Has been irritable lately. Has been feeling down and depressed. Feels like he wanted to die for a period of time but no sucididal ideations. Smokes marijuana and tobacco daily with occasional alcohol consumption. Condoms offered.    Allergies  Allergen Reactions   Shellfish  Allergy Hives   Doxycycline Rash      Outpatient Medications Prior to Visit  Medication Sig Dispense Refill   amLODipine (NORVASC) 2.5 MG tablet Take 1 tablet (2.5 mg total) by mouth daily. 30 tablet 6   aspirin EC 81 MG tablet Take 1 tablet (81 mg total) by mouth daily. Swallow whole. 30 tablet 6   atorvastatin (LIPITOR) 20 MG tablet Take 1 tablet (20 mg total) by mouth daily. 30 tablet 6   bictegravir-emtricitabine-tenofovir AF (BIKTARVY) 50-200-25 MG TABS tablet Take 1 tablet by mouth daily. 30 tablet 4   acetaminophen (TYLENOL) 325 MG tablet Take 650 mg by mouth every 6 (six) hours as needed. (Patient not taking: Reported on 09/24/2020)     Cyanocobalamin (VITAMIN B 12 PO) Take 1 tablet by mouth daily. (Patient not taking: Reported on 09/24/2020)     metroNIDAZOLE (FLAGYL) 500 MG tablet TAKE 1 TABLET (500 MG TOTAL) BY MOUTH EVERY 12 (TWELVE) HOURS FOR 4 DAYS. (Patient not taking: No sig reported) 8 tablet 0   omeprazole (PRILOSEC) 20 MG capsule TAKE 1 CAPSULE(20 MG) BY MOUTH DAILY (Patient not taking: Reported on 09/24/2020) 30 capsule 1   No facility-administered medications prior to visit.     Past Medical History:  Diagnosis Date   Anxiety    Asthma    Depression    HIV (human immunodeficiency virus infection) ([place])    Hypertension    Hypoglycemia    sugars drop ion     Past Surgical History:  Procedure Laterality Date   BIOPSY  08/18/2019   Procedure: BIOPSY;  Surgeon: Jackquline Denmark, MD;  Location: [facility] ENDOSCOPY;  Service: Endoscopy;;   COLONOSCOPY  02/12/2012   Procedure: COLONOSCOPY;  Surgeon: Leighton Ruff, MD;  Location: [facility] ENDOSCOPY;  Service: Endoscopy;  Laterality: N/A;   ESOPHAGOGASTRODUODENOSCOPY (EGD) WITH PROPOFOL N/A 08/18/2019   Procedure: ESOPHAGOGASTRODUODENOSCOPY (EGD) WITH PROPOFOL;  Surgeon: Jackquline Denmark, MD;  Location: [facility] ENDOSCOPY;  Service: Endoscopy;  Laterality: N/A;   HERNIA REPAIR     umbilibcal      Review of Systems  Constitutional:  Negative  for appetite change, chills, fatigue, fever and unexpected weight change.  Eyes:  Negative for visual disturbance.  Respiratory:  Negative for cough, chest tightness, shortness of breath and wheezing.   Cardiovascular:  Negative for chest pain and leg swelling.  Gastrointestinal:  Negative for abdominal pain, constipation, diarrhea, nausea and vomiting.  Genitourinary:  Negative for dysuria, flank pain, frequency, genital sores, hematuria and urgency.  Skin:  Negative for rash.  Allergic/Immunologic: Negative for immunocompromised state.  Neurological:  Negative for dizziness and headaches.  Psychiatric/Behavioral:  Positive for agitation and dysphoric mood. Negative for sleep disturbance and suicidal ideas.      Objective:    BP 139/90   Pulse 61   Temp 98.3 F (36.8 C) (Oral)   Wt 153 lb (69.4 kg)   SpO2 100%   BMI 23.96 kg/m  Nursing note and vital signs reviewed.  Physical Exam Constitutional:      General: He is not in acute distress.    Appearance: He is well-developed.  Eyes:     Conjunctiva/sclera: Conjunctivae normal.  Cardiovascular:     Rate and Rhythm: Normal rate and regular rhythm.     Heart sounds: Normal heart sounds. No murmur heard.   No friction rub. No gallop.  Pulmonary:     Effort: Pulmonary effort is normal. No respiratory distress.     Breath sounds: Normal breath sounds. No wheezing or rales.  Chest:     Chest wall: No tenderness.  Abdominal:     General: Bowel sounds are normal.     Palpations: Abdomen is soft.     Tenderness: There is no abdominal tenderness.  Musculoskeletal:     Cervical back: Neck supple.  Lymphadenopathy:     Cervical: No cervical adenopathy.  Skin:    General: Skin is warm and dry.     Findings: No rash.  Neurological:     Mental Status: He is alert and oriented to person, place, and time.  Psychiatric:        Mood and Affect: Mood is depressed.     Depression screen [facility] 2/9 07/31/2020 06/07/2020 11/21/2019  09/26/2019 09/05/2019  Decreased Interest 1 0 '1 1 2  '$ Down, Depressed, Hopeless 1 0 '1 1 2  '$ PHQ - 2 Score 2 0 '2 2 4  '$ Altered sleeping 3 - 0 - 2  Tired, decreased energy 2 - 0 - 1  Change in appetite 1 - 1 - 0  Feeling bad or failure about yourself  2 - 0 - 1  Trouble concentrating 2 - 1 - 2  Moving slowly or fidgety/restless 2 - 0 - 2  Suicidal thoughts 1 - 0 - 2  PHQ-9 Score 15 - 4 - 14  Difficult doing work/chores - - Somewhat difficult - -  Some recent data might be hidden       Assessment & Plan:    Patient Active Problem List   Diagnosis Date Noted   Giardia 04/22/2020  Protein-calorie malnutrition, severe 04/22/2020   Diarrhea 04/21/2020   Acute diffuse otitis externa of left ear 03/15/2020   Numbness of feet 03/11/2020   Intractable vomiting with nausea    Nausea vomiting and diarrhea    AKI (acute kidney injury) ([place]) 08/14/2019   Foot lesion 06/05/2019   Healthcare maintenance 04/26/2018   Hepatitis B immune 11/18/2016   Medically noncompliant    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    Other headache syndrome    Optic neuritis 04/16/2016   Chest pain 11/20/2015   Hypoglycemia 01/29/2014   Allergic sinusitis 06/29/2012   Pilonidal cyst 01/04/2012   Depression 06/25/2010   CONDYLOMA ACUMINATUM 01/07/2010   Essential hypertension 12/12/2009   Ventral hernia 03/28/2009   NEUROPATHY, NOS 04/30/2008   DENTAL CARIES 12/14/2007   TOBACCO USER 06/01/2007   VERTIGO 05/13/2007   HIV infection ([place]) 02/24/2006   HYPERLIPIDEMIA 02/24/2006   Rectal bleeding 11/19/2005   ASTHMA 12/11/1994     Problem List Items Addressed This Visit       Other   HIV infection ([place]) - Primary    Mr. Wasley continues to have well controlled HIV disease with good adherence and tolerance to [employer]. No signs/symptoms of opportunistic infection. Reviewed lab work and discussed plan of care. Continue current dose of Biktarvy. Plan for follow up in 4  months or sooner if needed with lab work 1-2 weeks prior to appointment.       Relevant Medications   bictegravir-emtricitabine-tenofovir AF (BIKTARVY) 50-200-25 MG TABS tablet   Depression    Mr. Coello appears to be having an exacerbation of depression. No suicidal ideations or signs of psychosis. Recommended to make an appointment with our counselor. Discussed that medications may take time to work and agreed to try counseling first. Advised to seek further care if thoughts of suicide develop or symptoms acutely worsen.       Healthcare maintenance    Discussed importance of safe sexual practice to reduce risk of STI. Condoms offered. Discussed/recommended Covid booster.       Other Visit Diagnoses     Screening for STDs (sexually transmitted diseases)       Relevant Orders   Urine cytology ancillary only([place])        I have discontinued Drexel E. Malphrus's Cyanocobalamin (VITAMIN B 12 PO), acetaminophen, omeprazole, and metroNIDAZOLE. I am also having him maintain his aspirin EC, amLODipine, atorvastatin, and bictegravir-emtricitabine-tenofovir AF.   Meds ordered this encounter  Medications   bictegravir-emtricitabine-tenofovir AF (BIKTARVY) 50-200-25 MG TABS tablet    Sig: Take 1 tablet by mouth daily.    Dispense:  30 tablet    Refill:  4    Order Specific Question:   Supervising Provider    Answer:   Carlyle Basques [[id number]]     Follow-up: Return in about 4 months (around 01/24/2021), or if symptoms worsen or fail to improve.   Terri Piedra, MSN, FNP-C Nurse Practitioner [facility] [place] number: [phone]

## 2020-09-24 NOTE — Assessment & Plan Note (Signed)
   Discussed importance of safe sexual practice to reduce risk of STI. Condoms offered.  Discussed/recommended Covid booster.

## 2020-09-24 NOTE — Patient Instructions (Addendum)
Nice to see you.  Continue to take your medication daily as prescribed.  Refills are available at the pharmacy.  Recommend scheduling an appointment with counseling to discuss next steps.  Plan for follow up in 4 months or sooner if needed with lab work 1-2 weeks prior to appointment.   Hope you get to feeling better and have a great day and stay safe!

## 2020-09-25 LAB — URINE CYTOLOGY ANCILLARY ONLY
Chlamydia: NEGATIVE
Comment: NEGATIVE
Comment: NORMAL
Neisseria Gonorrhea: NEGATIVE

## 2020-09-27 ENCOUNTER — Encounter: Payer: Medicaid Other | Admitting: Family

## 2020-10-01 ENCOUNTER — Ambulatory Visit: Payer: Self-pay

## 2020-10-11 ENCOUNTER — Encounter: Payer: Self-pay | Admitting: Family

## 2021-01-21 ENCOUNTER — Other Ambulatory Visit: Payer: Self-pay

## 2021-01-21 DIAGNOSIS — Z21 Asymptomatic human immunodeficiency virus [HIV] infection status: Secondary | ICD-10-CM

## 2021-02-04 ENCOUNTER — Encounter: Payer: Medicaid Other | Admitting: Family

## 2021-02-17 ENCOUNTER — Telehealth: Payer: Self-pay

## 2021-02-17 ENCOUNTER — Other Ambulatory Visit: Payer: Self-pay

## 2021-02-17 ENCOUNTER — Other Ambulatory Visit: Payer: Self-pay | Admitting: Pharmacist

## 2021-02-17 ENCOUNTER — Ambulatory Visit (INDEPENDENT_AMBULATORY_CARE_PROVIDER_SITE_OTHER): Payer: Self-pay | Admitting: Internal Medicine

## 2021-02-17 DIAGNOSIS — U071 COVID-19: Secondary | ICD-10-CM

## 2021-02-17 DIAGNOSIS — B20 Human immunodeficiency virus [HIV] disease: Secondary | ICD-10-CM

## 2021-02-17 MED ORDER — NIRMATRELVIR/RITONAVIR (PAXLOVID)TABLET: 3.0000 | ORAL_TABLET | Freq: Two times a day (BID) | ORAL | 0 refills | Status: DC

## 2021-02-17 MED ORDER — NIRMATRELVIR/RITONAVIR (PAXLOVID)TABLET: 3.0000 | ORAL_TABLET | Freq: Two times a day (BID) | ORAL | 0 refills | Status: AC

## 2021-02-17 MED ORDER — PAXLOVID (300/100) 20 X 150 MG & 10 X 100MG PO TBPK: 2.0000 | ORAL_TABLET | Freq: Two times a day (BID) | ORAL | 0 refills | Status: DC

## 2021-02-17 NOTE — Telephone Encounter (Signed)
Spoke with patient, he accepts same day telephone appointment to discuss COVID symptoms and possible treatment.   Beryle Flock, RN

## 2021-02-17 NOTE — Telephone Encounter (Signed)
Roderic Palau, pharmacist with [facility], called to clarify Paxlovid order. He wants to know if it should be dispensed as standard dosing or renal dosing.   Beryle Flock, RN

## 2021-02-17 NOTE — Addendum Note (Signed)
Addended byPrudencio Pair T on: 02/17/2021 05:15 PM   Modules accepted: Orders

## 2021-02-17 NOTE — Progress Notes (Signed)
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 male well controlled hiv here with covid sx   Sx started 5-6 days ago with subjective f/c, myalgia, headache, malaise, diarrhea, with late onset dry cough. Tested positive on 1/7 (day 3-4 of sx) with a work POC test which was positive  Symptoms are the same Eating and drinking ok  No chest pain, rest dyspnea, unilateral weakness/numbess, focal extremities swelling  Lab Results  Component Value Date   HIV1RNAQUANT <20 (H) 09/13/2020   Lab Results  Component Value Date   CD4TCELL 29 (L) 09/13/2020   CD4TABS 509 03/11/2020   No missed dose on biktarvy   Review of Systems: ROS All other ros      Past Medical History:  Diagnosis Date   Anxiety    Asthma    Depression    HIV (human immunodeficiency virus infection) ([place])    Hypertension     Hypoglycemia    sugars drop ion    Social History   Tobacco Use   Smoking status: Every Day    Packs/day: 0.20    Years: 15.00    Pack years: 3.00    Types: Cigarettes   Smokeless tobacco: Never  Substance Use Topics   Alcohol use: Yes    Alcohol/week: 2.0 standard drinks    Types: 1 Glasses of wine, 1 Shots of liquor per week    Comment: occasional   Drug use: Yes    Frequency: 7.0 times per week    Types: Marijuana    Comment: marijuana daily    Family History  Problem Relation Age of Onset   Diabetes Mother    Hypertension Mother    Heart disease Mother    Congestive Heart Failure Mother    Diabetes Father    Hypertension Father    Diabetes Sister    HIV Sister    Hypertension Sister    Diabetes Brother    Hypertension Brother    Diabetes Brother    Hypertension Brother    Diabetes Brother    Hypertension Brother    Diabetes Brother    Hypertension Brother     Allergies  Allergen Reactions   Shellfish Allergy Hives   Doxycycline Rash    OBJECTIVE: There were no vitals filed for this visit. There is no height or  weight on file to calculate BMI.   Physical Exam  Telephone visit   Lab:  Microbiology:  Serology:  Imaging:   Assessment/plan: Problem List Items Addressed This Visit   None Visit Diagnoses     HIV disease ([place])    -  Primary   Relevant Medications   nirmatrelvir & ritonavir (PAXLOVID, 300/100,) 20 x 150 MG & 10 x 100MG  TBPK   COVID-19       Relevant Medications   nirmatrelvir & ritonavir (PAXLOVID, 300/100,) 20 x 150 MG & 10 x 100MG  TBPK        A candidate for paxlovid. Sx currently mild. Day 5-6 of sx, so a little advanced along but still likely could benefit from paxlovid  Can hold lipitor for now while on paxlovid and resume once paxlovid course done  F/u with Terri Piedra for ongoing HIV care Go to ED if worsening short of breath or persistent fever, go to ED  Isolation discussed cdc guidine 10 days from sx  onset can return to work      I verified that I was speaking with the correct person using two identifiers. Due to the COVID-19 Pandemic, this service was provided via telemedicine using audio/visual media.   The patient was located at home. The provider was located in the office. The patient did consent to this visit and is aware of charges through their insurance as well as the limitations of evaluation and management by telemedicine. Other persons participating in this telemedicine service were none. Time spent on visit was greater than 20 minutes on media and in coordination of care   This was a phone visit   Follow-up: Return if symptoms worsen or fail to improve.  Jabier Mutton, [facility] for [place] [phone] pager   [phone] cell 02/17/2021, 4:07 PM

## 2021-02-18 NOTE — Telephone Encounter (Signed)
Hahaha, I'll have to check out that book

## 2021-02-18 NOTE — Telephone Encounter (Signed)
Rx should be nirmatrelvir 300 mg BID + ritonavir 100 mg BID. Nirmatrelvir only comes in 150 mg so it needs to be 2 pills of nirmatrelvir BID + 1 pill of the 100 mg ritonavir BID = 3 tablets BID. His CrCl is 64 ml/min and the cutoff for renal dosing is 60, so he needs standard dosing.   Looks like the dose you sent in the second time is correct, Dr. Gale Journey!

## 2021-04-02 ENCOUNTER — Telehealth: Payer: Self-pay | Admitting: Family

## 2021-04-09 ENCOUNTER — Ambulatory Visit (INDEPENDENT_AMBULATORY_CARE_PROVIDER_SITE_OTHER): Payer: Self-pay | Admitting: Family

## 2021-04-09 ENCOUNTER — Other Ambulatory Visit: Payer: Self-pay

## 2021-04-09 ENCOUNTER — Ambulatory Visit: Payer: Self-pay

## 2021-04-09 ENCOUNTER — Encounter: Payer: Self-pay | Admitting: Family

## 2021-04-09 VITALS — BP 137/82 | HR 76 | Temp 98.6°F | Resp 16 | Ht 67.0 in | Wt 148.0 lb

## 2021-04-09 DIAGNOSIS — Z0000 Encounter for general adult medical examination without abnormal findings: Secondary | ICD-10-CM

## 2021-04-09 DIAGNOSIS — Z113 Encounter for screening for infections with a predominantly sexual mode of transmission: Secondary | ICD-10-CM

## 2021-04-09 DIAGNOSIS — Z21 Asymptomatic human immunodeficiency virus [HIV] infection status: Secondary | ICD-10-CM

## 2021-04-09 MED ORDER — BICTEGRAVIR-EMTRICITAB-TENOFOV 50-200-25 MG PO TABS: 1.0000 | ORAL_TABLET | Freq: Every day | ORAL | 5 refills | Status: DC

## 2021-04-09 NOTE — Assessment & Plan Note (Signed)
?   Discussed importance of safe sexual practices and condom use.  Condoms offered. ?? Check urine for gonorrhea/chlamydia per request. ?? Declines vaccination. ?

## 2021-04-09 NOTE — Progress Notes (Signed)
Brief Narrative   Patient ID: Ryan Cruz, male    DOB: 12/02/1965, 56 y.o.   MRN: [id number]  Ryan Cruz is a 56 y/o AA male diagnosed with HIV-1 disease in March 2007 with risk factor of MSM. Initial CD4 count and viral load are unavailable. No history of opportunistic infection. [id number] negative. Previous ART experience with Atripla, Vernell Leep, and currently Biktarvy.   Subjective:    Chief Complaint  Patient presents with   Follow-up    B20     HPI:  Ryan Cruz is a 56 y.o. male with HIV disease last seen on 02/17/21 by Dr. Gale Journey for an acute office visit with Covid and was prescribed Paxlovid. Last HIV visit was 09/24/20 with well controlled virus and good adherence and tolerance to his ART regimen of Biktarvy. Viral load was undetectable and CD4 count 531. Kidney function, liver function and electrolytes within normal ranges. Here today for routine follow up.   [name] continues to take his Biktarvy daily as prescribed with no adverse side effects.  Overall feeling well today with no new concerns/complaints. Denies fevers, chills, night sweats, headaches, changes in vision, neck pain/stiffness, nausea, diarrhea, vomiting, lesions or rashes.  [name] has no problems obtaining medication from the pharmacy.  Denies feelings of being down, depressed, or hopeless recently.  Continues to use marijuana daily with occasional alcohol consumption and daily tobacco use.  Condoms offered.  Healthcare maintenance due includes tetanus.  Allergies  Allergen Reactions   Shellfish Allergy Hives   Doxycycline Rash      Outpatient Medications Prior to Visit  Medication Sig Dispense Refill   amLODipine (NORVASC) 2.5 MG tablet Take 1 tablet (2.5 mg total) by mouth daily. 30 tablet 6   aspirin EC 81 MG tablet Take 1 tablet (81 mg total) by mouth daily. Swallow whole. 30 tablet 6   atorvastatin (LIPITOR) 20 MG tablet Take 1 tablet (20 mg total) by mouth daily. 30 tablet 6    bictegravir-emtricitabine-tenofovir AF (BIKTARVY) 50-200-25 MG TABS tablet Take 1 tablet by mouth daily. 30 tablet 4   No facility-administered medications prior to visit.     Past Medical History:  Diagnosis Date   Anxiety    Asthma    Depression    HIV (human immunodeficiency virus infection) ([place])    Hypertension    Hypoglycemia    sugars drop ion     Past Surgical History:  Procedure Laterality Date   BIOPSY  08/18/2019   Procedure: BIOPSY;  Surgeon: Jackquline Denmark, MD;  Location[facility]COPY;  Service: Endoscopy;;   COLONOSCOPY  02/12/2012   Procedure: COLONOSCOPY;  Surgeon: Leighton Ruff, MD;  Location: [facility] ENDOSCOPY;  Service: Endoscopy;  Laterality: N/A;   ESOPHAGOGASTRODUODENOSCOPY (EGD) WITH PROPOFOL N/A 08/18/2019   Procedure: ESOPHAGOGASTRODUODENOSCOPY (EGD) WITH PROPOFOL;  Surgeon: Jackquline Denmark, MD;  Location: [facility] ENDOSCOPY;  Service: Endoscopy;  Laterality: N/A;   HERNIA REPAIR     umbilibcal      Review of Systems  Constitutional:  Negative for appetite change, chills, fatigue, fever and unexpected weight change.  Eyes:  Negative for visual disturbance.  Respiratory:  Negative for cough, chest tightness, shortness of breath and wheezing.   Cardiovascular:  Negative for chest pain and leg swelling.  Gastrointestinal:  Negative for abdominal pain, constipation, diarrhea, nausea and vomiting.  Genitourinary:  Negative for dysuria, flank pain, frequency, genital sores, hematuria and urgency.  Skin:  Negative for rash.  Allergic/Immunologic: Negative for immunocompromised state.  Neurological:  Negative for dizziness and headaches.     Objective:    BP 137/82    Pulse 76    Temp 98.6 F (37 C) (Oral)    Resp 16    Ht 5\' 7"  (1.702 m)    Wt 148 lb (67.1 kg)    SpO2 96%    BMI 23.18 kg/m  Nursing note and vital signs reviewed.  Physical Exam Constitutional:      General: He is not in acute distress.    Appearance: He is well-developed.  Eyes:      Conjunctiva/sclera: Conjunctivae normal.  Cardiovascular:     Rate and Rhythm: Normal rate and regular rhythm.     Heart sounds: Normal heart sounds. No murmur heard.   No friction rub. No gallop.  Pulmonary:     Effort: Pulmonary effort is normal. No respiratory distress.     Breath sounds: Normal breath sounds. No wheezing or rales.  Chest:     Chest wall: No tenderness.  Abdominal:     General: Bowel sounds are normal.     Palpations: Abdomen is soft.     Tenderness: There is no abdominal tenderness.  Musculoskeletal:     Cervical back: Neck supple.  Lymphadenopathy:     Cervical: No cervical adenopathy.  Skin:    General: Skin is warm and dry.     Findings: No rash.  Neurological:     Mental Status: He is alert and oriented to person, place, and time.  Psychiatric:        Behavior: Behavior normal.        Thought Content: Thought content normal.        Judgment: Judgment normal.     Depression screen [facility] 2/9 04/09/2021 07/31/2020 06/07/2020 11/21/2019 09/26/2019  Decreased Interest 0 1 0 1 1  Down, Depressed, Hopeless 0 1 0 1 1  PHQ - 2 Score 0 2 0 2 2  Altered sleeping - 3 - 0 -  Tired, decreased energy - 2 - 0 -  Change in appetite - 1 - 1 -  Feeling bad or failure about yourself  - 2 - 0 -  Trouble concentrating - 2 - 1 -  Moving slowly or fidgety/restless - 2 - 0 -  Suicidal thoughts - 1 - 0 -  PHQ-9 Score - 15 - 4 -  Difficult doing work/chores - - - Somewhat difficult -  Some recent data might be hidden       Assessment & Plan:    Patient Active Problem List   Diagnosis Date Noted   Giardia 04/22/2020   Protein-calorie malnutrition, severe 04/22/2020   Diarrhea 04/21/2020   Acute diffuse otitis externa of left ear 03/15/2020   Numbness of feet 03/11/2020   Intractable vomiting with nausea    Nausea vomiting and diarrhea    AKI (acute kidney injury) ([place]) 08/14/2019   Foot lesion 06/05/2019   Healthcare maintenance 04/26/2018   Hepatitis B immune  11/18/2016   Medically noncompliant    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    Other headache syndrome    Optic neuritis 04/16/2016   Chest pain 11/20/2015   Hypoglycemia 01/29/2014   Allergic sinusitis 06/29/2012   Pilonidal cyst 01/04/2012   Depression 06/25/2010   CONDYLOMA ACUMINATUM 01/07/2010   Essential hypertension 12/12/2009   Ventral hernia 03/28/2009   NEUROPATHY, NOS 04/30/2008   DENTAL CARIES 12/14/2007   TOBACCO USER 06/01/2007   VERTIGO 05/13/2007  HIV infection ([place]) 02/24/2006   HYPERLIPIDEMIA 02/24/2006   Rectal bleeding 11/19/2005   ASTHMA 12/11/1994     Problem List Items Addressed This Visit       Other   HIV infection ([place]) - Primary    Mr. Craighead continues to have well-controlled virus with good adherence and tolerance to his ART regimen of Biktarvy.  No signs/symptoms of opportunistic infection.  Reviewed previous lab work and discussed plan of care.  Check blood work today.  Renew financial assistance.  Continue current dose of Biktarvy.  Plan for follow-up in 4 months or sooner if needed with lab work on the same day.      Relevant Medications   bictegravir-emtricitabine-tenofovir AF (BIKTARVY) 50-200-25 MG TABS tablet   Other Relevant Orders   HIV-1 RNA quant-no reflex-bld   T-helper cells (CD4) count (not at [facility])   Comprehensive metabolic panel   Healthcare maintenance    Discussed importance of safe sexual practices and condom use.  Condoms offered. Check urine for gonorrhea/chlamydia per request. Declines vaccination.      Other Visit Diagnoses     Screening for STDs (sexually transmitted diseases)       Relevant Orders   RPR   C. trachomatis/N. gonorrhoeae RNA        I am having [place] maintain his aspirin EC, amLODipine, atorvastatin, and bictegravir-emtricitabine-tenofovir AF.   Meds ordered this encounter  Medications   bictegravir-emtricitabine-tenofovir AF (BIKTARVY)  50-200-25 MG TABS tablet    Sig: Take 1 tablet by mouth daily.    Dispense:  30 tablet    Refill:  5    Order Specific Question:   Supervising Provider    Answer:   Carlyle Basques [[id number]]     Follow-up: Return in about 4 months (around 08/09/2021), or if symptoms worsen or fail to improve.   Terri Piedra, MSN, FNP-C Nurse Practitioner [facility] [place] number: [phone]

## 2021-04-09 NOTE — Assessment & Plan Note (Signed)
Ryan Cruz continues to have well-controlled virus with good adherence and tolerance to his ART regimen of Biktarvy.  No signs/symptoms of opportunistic infection.  Reviewed previous lab work and discussed plan of care.  Check blood work today.  Renew financial assistance.  Continue current dose of Biktarvy.  Plan for follow-up in 4 months or sooner if needed with lab work on the same day. ?

## 2021-04-09 NOTE — Patient Instructions (Signed)
Nice to see you.  We will check your lab work today.  Continue to take your medication daily as prescribed.  Refills have been sent to the pharmacy.  Plan for follow up in 4 months or sooner if needed with lab work on the same day.  Have a great day and stay safe!  

## 2021-04-11 LAB — T-HELPER CELLS (CD4) COUNT (NOT AT ARMC)
Absolute CD4: 607 cells/uL (ref 490–1740)
CD4 T Helper %: 32 % (ref 30–61)
Total lymphocyte count: 1895 cells/uL (ref 850–3900)

## 2021-04-11 LAB — COMPREHENSIVE METABOLIC PANEL
AG Ratio: 1.8 (calc) (ref 1.0–2.5)
ALT: 21 U/L (ref 9–46)
AST: 15 U/L (ref 10–35)
Albumin: 4.6 g/dL (ref 3.6–5.1)
Alkaline phosphatase (APISO): 68 U/L (ref 35–144)
BUN/Creatinine Ratio: 9 (calc) (ref 6–22)
BUN: 12 mg/dL (ref 7–25)
CO2: 27 mmol/L (ref 20–32)
Calcium: 9.3 mg/dL (ref 8.6–10.3)
Chloride: 108 mmol/L (ref 98–110)
Creat: 1.35 mg/dL — ABNORMAL HIGH (ref 0.70–1.30)
Globulin: 2.5 g/dL (calc) (ref 1.9–3.7)
Glucose, Bld: 71 mg/dL (ref 65–99)
Potassium: 3.9 mmol/L (ref 3.5–5.3)
Sodium: 141 mmol/L (ref 135–146)
Total Bilirubin: 1 mg/dL (ref 0.2–1.2)
Total Protein: 7.1 g/dL (ref 6.1–8.1)

## 2021-04-11 LAB — C. TRACHOMATIS/N. GONORRHOEAE RNA
C. trachomatis RNA, TMA: NOT DETECTED
N. gonorrhoeae RNA, TMA: NOT DETECTED

## 2021-04-11 LAB — HIV-1 RNA QUANT-NO REFLEX-BLD
HIV 1 RNA Quant: <20 Copies/mL — ABNORMAL HIGH
HIV-1 RNA Quant, Log: <1.3 Log cps/mL — ABNORMAL HIGH

## 2021-04-11 LAB — RPR: RPR Ser Ql: NONREACTIVE

## 2021-08-18 ENCOUNTER — Ambulatory Visit: Payer: Self-pay | Admitting: Family

## 2021-09-12 ENCOUNTER — Other Ambulatory Visit: Payer: Self-pay | Admitting: Family Medicine

## 2021-09-12 DIAGNOSIS — I10 Essential (primary) hypertension: Secondary | ICD-10-CM

## 2021-09-12 DIAGNOSIS — E7800 Pure hypercholesterolemia, unspecified: Secondary | ICD-10-CM

## 2021-09-12 NOTE — Telephone Encounter (Signed)
Requested medications are due for refill today.  yes  Requested medications are on the active medications list.  yes  Last refill. Both refilled 07/31/2020 #30 6 refills  Future visit scheduled.   no  Notes to clinic.  Pt is more than 6 months over due for OV. Expired labs.    Requested Prescriptions  Pending Prescriptions Disp Refills   amLODipine (NORVASC) 2.5 MG tablet [Pharmacy Med Name: AMLODIPINE BESYLATE 2.'5MG'$  TABLETS] 30 tablet 6    Sig: TAKE 1 TABLET(2.5 MG) BY MOUTH DAILY     Cardiovascular: Calcium Channel Blockers 2 Failed - 09/12/2021 11:52 AM      Failed - Valid encounter within last 6 months    Recent Outpatient Visits           1 year ago [place], Charlane Ferretti, MD   2 years ago Human immunodeficiency virus (HIV) disease ([place])   [place], Charlane Ferretti, MD   4 years ago Other constipation   [place], Charlane Ferretti, MD   5 years ago Optic neuritis   [place], Charlane Ferretti, MD   5 years ago Elevated blood sugar   [place], Dionne Bucy, [place]       Future Appointments             In 3 days [place], Ples Specter, [place] for Infectious Disease, [facility]            Passed - Last BP in normal range    BP Readings from Last 1 Encounters:  04/09/21 137/82         Passed - Last Heart Rate in normal range    Pulse Readings from Last 1 Encounters:  04/09/21 76          atorvastatin (LIPITOR) 20 MG tablet [Pharmacy Med Name: ATORVASTATIN '20MG'$  TABLETS] 30 tablet 6    Sig: TAKE 1 TABLET(20 MG) BY MOUTH DAILY     Cardiovascular:  Antilipid - Statins Failed - 09/12/2021 11:52 AM      Failed - Valid encounter within last 12 months    Recent Outpatient Visits           1 year ago [place],  [place]ob[place], MD   2 years ago Human immunodeficiency virus (HIV) disease ([place])   [place], Charlane Ferretti, MD   4 years ago Other constipation   [place], Enobong, MD   5 years ago Optic neuritis   [place], Charlane Ferretti, MD   5 years ago Elevated blood sugar   [place], Dionne Bucy, [place]       Future Appointments             In 3 days [place], Ples Specter, [place] for Infectious Disease, [facility]            Failed - Lipid Panel in normal range within the last 12 months    Cholesterol  Date Value Ref Range Status  05/23/2020 213 (H) <200 mg/dL Final   LDL Cholesterol (Calc)  Date Value Ref Range Status  05/23/2020 130 (H) mg/dL (calc) Final    Comment:    Reference range: <100 . Desirable range <100 mg/dL for  primary prevention;   <70 mg/dL for patients with CHD or diabetic patients  with > or = 2 CHD risk factors. Marland Kitchen LDL-C is now calculated using the Martin-Hopkins  calculation, which is a validated novel method providing  better accuracy than the Friedewald equation in the  estimation of LDL-C.  Cresenciano Genre et al. Annamaria Helling. 6979;480(16): 2061-2068  (http://education.[employer].com/faq/FAQ164)    HDL  Date Value Ref Range Status  05/23/2020 58 > OR = 40 mg/dL Final   Triglycerides  Date Value Ref Range Status  05/23/2020 139 <150 mg/dL Final         Passed - Patient is not pregnant

## 2021-09-12 NOTE — Telephone Encounter (Signed)
Called patient - LMOM to call back

## 2021-09-15 ENCOUNTER — Other Ambulatory Visit: Payer: Self-pay

## 2021-09-15 ENCOUNTER — Ambulatory Visit (INDEPENDENT_AMBULATORY_CARE_PROVIDER_SITE_OTHER): Payer: Self-pay | Admitting: Family

## 2021-09-15 ENCOUNTER — Encounter: Payer: Self-pay | Admitting: Family

## 2021-09-15 VITALS — BP 133/83 | HR 70 | Temp 98.2°F | Ht 67.0 in | Wt 145.0 lb

## 2021-09-15 DIAGNOSIS — Z5181 Encounter for therapeutic drug level monitoring: Secondary | ICD-10-CM

## 2021-09-15 DIAGNOSIS — Z0000 Encounter for general adult medical examination without abnormal findings: Secondary | ICD-10-CM

## 2021-09-15 DIAGNOSIS — Z113 Encounter for screening for infections with a predominantly sexual mode of transmission: Secondary | ICD-10-CM

## 2021-09-15 DIAGNOSIS — Z21 Asymptomatic human immunodeficiency virus [HIV] infection status: Secondary | ICD-10-CM

## 2021-09-15 MED ORDER — BICTEGRAVIR-EMTRICITAB-TENOFOV 50-200-25 MG PO TABS: 1.0000 | ORAL_TABLET | Freq: Every day | ORAL | 5 refills | Status: DC

## 2021-09-15 NOTE — Assessment & Plan Note (Signed)
Mr. Livesay has well controlled virus with good adherence and tolerance to Biktarvy. Reviewed previous lab work and discussed plan of care. Check lab work today. Renew financial assistance. Continue current dose of Biktarvy.

## 2021-09-15 NOTE — Assessment & Plan Note (Signed)
Ryan Cruz's renal function is stable with eGFR of 62 and CrCl 59.3. Continue to monitoring.

## 2021-09-15 NOTE — Patient Instructions (Addendum)
Nice to see you.  We will check your lab work today.  Continue to take your medication daily as prescribed.  Renew financial assistance today.   Refills have been sent to the pharmacy.  Plan for follow up in 3 months or sooner if needed with lab work on the same day.  Have a great day and stay safe!

## 2021-09-15 NOTE — Progress Notes (Signed)
Brief Narrative   Patient ID: Ryan Cruz, male    DOB: 02-06-1966, 56 y.o.   MRN: [id number]  Ryan Cruz is a 56 y/o AA male diagnosed with HIV-1 disease in March 2007 with risk factor of MSM. Initial CD4 count and viral load are unavailable. Genosures with no significant medication resistant mutations. No history of opportunistic infection. [id number] negative. Previous ART experience with Atripla, Charlett Lango, and currently Biktarvy.   Subjective:    Chief Complaint  Patient presents with   Follow-up    HPI:  Ryan Cruz is a 56 y.o. male with HIV disease last seen on 04/09/21 with well controlled virus and good adherence and tolerance to his ART regimen of Biktarvy. Viral load was undetectable and CD4 count 607. Renal function with creatinine 1.35 (eGFR 62.39 and CrCl 59.3) and normal hepatic function and electrolytes. STD testing negative. Here today for routine follow up.  Ryan Cruz continues to take his Biktarvy daily as prescribed with no adverse side effects. Feeling well today with no new concerns or complaints. Denies fevers, chills, night sweats, headaches, changes in vision, neck pain/stiffness, nausea, diarrhea, vomiting, lesions or rashes.  Ryan Cruz has UMAP that will need to be renewed today. Denies feelings of being down, depressed or hopeless. Drinks alcohol rarely, with marijuana and tobacco use daily. Working full time as a Lawyer. Condoms and STD testing offered. Healthcare maintenance due includes routine dental care which he schedules independently. Planning on a trip to [place].    Allergies  Allergen Reactions   Shellfish Allergy Hives   Doxycycline Rash      Outpatient Medications Prior to Visit  Medication Sig Dispense Refill   aspirin EC 81 MG tablet Take 1 tablet (81 mg total) by mouth daily. Swallow whole. 30 tablet 6   atorvastatin (LIPITOR) 20 MG tablet Take 1 tablet (20 mg total) by mouth daily. 30 tablet 6   bictegravir-emtricitabine-tenofovir AF  (BIKTARVY) 50-200-25 MG TABS tablet Take 1 tablet by mouth daily. 30 tablet 5   amLODipine (NORVASC) 2.5 MG tablet Take 1 tablet (2.5 mg total) by mouth daily. (Patient not taking: Reported on 09/15/2021) 30 tablet 6   No facility-administered medications prior to visit.     Past Medical History:  Diagnosis Date   Anxiety    Asthma    Depression    HIV (human immunodeficiency virus infection) ([facility])    Hypertension    Hypoglycemia    sugars drop ion     Past Surgical History:  Procedure Laterality Date   BIOPSY  08/18/2019   Procedure: BIOPSY;  Surgeon: Lynann Bologna, MD;  Location[facility] END[facility]COPY;  Service: Endoscopy;;   COLONOSCOPY  02/12/2012   Procedure: COLONOSCOPY;  Surgeon: Romie Levee, MD;  Location: [facility] ENDOSCOPY;  Service: Endoscopy;  Laterality: N/A;   ESOPHAGOGASTRODUODENOSCOPY (EGD) WITH PROPOFOL N/A 08/18/2019   Procedure: ESOPHAGOGASTRODUODENOSCOPY (EGD) WITH PROPOFOL;  Surgeon: Lynann Bologna, MD;  Location: [facility] ENDOSCOPY;  Service: Endoscopy;  Laterality: N/A;   HERNIA REPAIR     umbilibcal      Review of Systems  Constitutional:  Negative for appetite change, chills, fatigue, fever and unexpected weight change.  Eyes:  Negative for visual disturbance.  Respiratory:  Negative for cough, chest tightness, shortness of breath and wheezing.   Cardiovascular:  Negative for chest pain and leg swelling.  Gastrointestinal:  Negative for abdominal pain, constipation, diarrhea, nausea and vomiting.  Genitourinary:  Negative for dysuria, flank pain, frequency, genital sores, hematuria and urgency.  Skin:  Negative for rash.  Allergic/Immunologic: Negative for immunocompromised state.  Neurological:  Negative for dizziness and headaches.      Objective:    BP 133/83   Pulse 70   Temp 98.2 F (36.8 C) (Oral)   Ht $R'5\' 7"'Ci$  (1.702 m)   Wt 145 lb (65.8 kg)   SpO2 99%   BMI 22.71 kg/m  Nursing note and vital signs reviewed.  Physical Exam Constitutional:      General: He  is not in acute distress.    Appearance: He is well-developed.  Eyes:     Conjunctiva/sclera: Conjunctivae normal.  Cardiovascular:     Rate and Rhythm: Normal rate and regular rhythm.     Heart sounds: Normal heart sounds. No murmur heard.    No friction rub. No gallop.  Pulmonary:     Effort: Pulmonary effort is normal. No respiratory distress.     Breath sounds: Normal breath sounds. No wheezing or rales.  Chest:     Chest wall: No tenderness.  Abdominal:     General: Bowel sounds are normal.     Palpations: Abdomen is soft.     Tenderness: There is no abdominal tenderness.  Musculoskeletal:     Cervical back: Neck supple.  Lymphadenopathy:     Cervical: No cervical adenopathy.  Skin:    General: Skin is warm and dry.     Findings: No rash.  Neurological:     Mental Status: He is alert and oriented to person, place, and time.  Psychiatric:        Behavior: Behavior normal.        Thought Content: Thought content normal.        Judgment: Judgment normal.         09/15/2021    9:57 AM 04/09/2021    2:38 PM 07/31/2020   11:17 AM 06/07/2020    8:58 AM 11/21/2019    4:11 PM  Depression screen PHQ 2/9  Decreased Interest 0 0 1 0 1  Down, Depressed, Hopeless 0 0 1 0 1  PHQ - 2 Score 0 0 2 0 2  Altered sleeping   3  0  Tired, decreased energy   2  0  Change in appetite   1  1  Feeling bad or failure about yourself    2  0  Trouble concentrating   2  1  Moving slowly or fidgety/restless   2  0  Suicidal thoughts   1  0  PHQ-9 Score   15  4  Difficult doing work/chores     Somewhat difficult       Assessment & Plan:    Patient Active Problem List   Diagnosis Date Noted   Screening for STDs (sexually transmitted diseases) 09/15/2021   Therapeutic drug monitoring 09/15/2021   Giardia 04/22/2020   Protein-calorie malnutrition, severe 04/22/2020   Diarrhea 04/21/2020   Acute diffuse otitis externa of left ear 03/15/2020   Numbness of feet 03/11/2020   Intractable  vomiting with nausea    Nausea vomiting and diarrhea    AKI (acute kidney injury) ([place]) 08/14/2019   Foot lesion 06/05/2019   Healthcare maintenance 04/26/2018   Hepatitis B immune 11/18/2016   Medically noncompliant    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    Other headache syndrome    Optic neuritis 04/16/2016   Chest pain 11/20/2015   Hypoglycemia 01/29/2014   Allergic sinusitis 06/29/2012   Pilonidal cyst 01/04/2012  Depression 06/25/2010   CONDYLOMA ACUMINATUM 01/07/2010   Essential hypertension 12/12/2009   Ventral hernia 03/28/2009   NEUROPATHY, NOS 04/30/2008   DENTAL CARIES 12/14/2007   TOBACCO USER 06/01/2007   VERTIGO 05/13/2007   HIV infection ([place]) 02/24/2006   HYPERLIPIDEMIA 02/24/2006   Rectal bleeding 11/19/2005   ASTHMA 12/11/1994     Problem List Items Addressed This Visit       Other   HIV infection ([place]) - Primary    Mr. Cordts has well controlled virus with good adherence and tolerance to Biktarvy. Reviewed previous lab work and discussed plan of care. Check lab work today. Renew financial assistance. Continue current dose of Biktarvy.       Relevant Medications   bictegravir-emtricitabine-tenofovir AF (BIKTARVY) 50-200-25 MG TABS tablet   Other Relevant Orders   COMPLETE METABOLIC PANEL WITH GFR   HIV-1 RNA quant-no reflex-bld   T-helper cell (CD4)- ([facility] clinic only)   RPR   Healthcare maintenance    Discussed importance of safe sexual practice, family planning and condom use. Condoms offered.  Routine vaccinations up to date.  Due for dental care and schedules independently.       Screening for STDs (sexually transmitted diseases)   Relevant Orders   COMPLETE METABOLIC PANEL WITH GFR   HIV-1 RNA quant-no reflex-bld   T-helper cell (CD4)- ([facility] clinic only)   RPR   Therapeutic drug monitoring    Ryan Cruz's renal function is stable with eGFR of 62 and CrCl 59.3. Continue to monitoring.          I have discontinued Ryan Cruz's amLODipine. I am also having him maintain his aspirin EC, atorvastatin, and bictegravir-emtricitabine-tenofovir AF.   Meds ordered this encounter  Medications   bictegravir-emtricitabine-tenofovir AF (BIKTARVY) 50-200-25 MG TABS tablet    Sig: Take 1 tablet by mouth daily.    Dispense:  30 tablet    Refill:  5    Order Specific Question:   Supervising Provider    Answer:   Carlyle Basques [[id number]]     Follow-up: Return in about 3 months (around 12/16/2021), or if symptoms worsen or fail to improve.   Terri Piedra, MSN, FNP-C Nurse Practitioner [facility] for Infect[facility] Disease [place] number: [phone]

## 2021-09-15 NOTE — Assessment & Plan Note (Addendum)
   Discussed importance of safe sexual practice, family planning and condom use. Condoms offered.   Routine vaccinations up to date.   Due for dental care and schedules independently.

## 2021-09-17 ENCOUNTER — Other Ambulatory Visit: Payer: Self-pay

## 2021-12-02 ENCOUNTER — Other Ambulatory Visit: Payer: Medicaid Other

## 2021-12-16 ENCOUNTER — Ambulatory Visit: Payer: Self-pay | Admitting: Family

## 2022-04-09 ENCOUNTER — Encounter: Payer: Self-pay | Admitting: Internal Medicine

## 2022-06-12 NOTE — Telephone Encounter (Signed)
ERROR

## 2022-06-22 ENCOUNTER — Ambulatory Visit: Payer: Self-pay | Admitting: Family

## 2022-06-29 ENCOUNTER — Other Ambulatory Visit (HOSPITAL_COMMUNITY): Payer: Self-pay

## 2022-06-29 ENCOUNTER — Ambulatory Visit: Payer: 59 | Admitting: Family

## 2022-06-29 ENCOUNTER — Other Ambulatory Visit: Payer: Self-pay

## 2022-06-29 ENCOUNTER — Encounter: Payer: Self-pay | Admitting: Family

## 2022-06-29 VITALS — BP 145/81 | HR 70 | Temp 98.0°F | Ht 67.0 in | Wt 145.0 lb

## 2022-06-29 DIAGNOSIS — Z113 Encounter for screening for infections with a predominantly sexual mode of transmission: Secondary | ICD-10-CM

## 2022-06-29 DIAGNOSIS — Z21 Asymptomatic human immunodeficiency virus [HIV] infection status: Secondary | ICD-10-CM | POA: Diagnosis not present

## 2022-06-29 DIAGNOSIS — Z0000 Encounter for general adult medical examination without abnormal findings: Secondary | ICD-10-CM

## 2022-06-29 DIAGNOSIS — F17200 Nicotine dependence, unspecified, uncomplicated: Secondary | ICD-10-CM | POA: Diagnosis not present

## 2022-06-29 LAB — COMPLETE METABOLIC PANEL WITH GFR
AG Ratio: 1.6 (calc) (ref 1.0–2.5)
ALT: 18 U/L (ref 9–46)
AST: 18 U/L (ref 10–35)
Alkaline phosphatase (APISO): 64 U/L (ref 35–144)
Globulin: 3 g/dL (calc) (ref 1.9–3.7)
Glucose, Bld: 76 mg/dL (ref 65–99)
Total Protein: 7.9 g/dL (ref 6.1–8.1)

## 2022-06-29 MED ORDER — BICTEGRAVIR-EMTRICITAB-TENOFOV 50-200-25 MG PO TABS
1.0000 | ORAL_TABLET | Freq: Every day | ORAL | 5 refills | Status: DC
Start: 2022-06-29 — End: 2022-09-08

## 2022-06-29 NOTE — Patient Instructions (Addendum)
Nice to see you.  We will check your lab work today.  Continue to take your medication daily as prescribed.  Refills have been sent to the pharmacy.  Plan for follow up in 2 months or sooner if needed with lab work on the same day.  Have a great day and stay safe!  

## 2022-06-29 NOTE — Assessment & Plan Note (Signed)
Mr. Tutwiler likely has well-controlled virus with decent adherence and good tolerance to [place].  Discussed importance of taking medication daily as prescribed to reduce risk of resistance development.  Reviewed previous lab work, plan of care, and U=U. Check lab work. Continue current dose of Biktarvy. Plan for follow up in 2 months or sooner if needed with lab work on the same day.

## 2022-06-29 NOTE — Assessment & Plan Note (Signed)
Ryan Cruz continues to smoke tobacco daily.  Reviewed the recommendation for tobacco cessation to reduce risk of cardiovascular, respiratory, malignant, and renal disease in the future.  In the precontemplation stage and not ready to quit at this time.

## 2022-06-29 NOTE — Progress Notes (Signed)
Brief Narrative   Patient ID: Ryan Cruz, male    DOB: 11/11/1965, 57 y.o.   MRN: [id number]  Ryan Cruz is a 57 y/o AA male diagnosed with HIV-1 disease in March 2007 with risk factor of MSM. Initial CD4 count and viral load are unavailable. Genosures with no significant medication resistant mutations. No history of opportunistic infection. [id number] negative. Previous ART experience with Atripla, Charlett Lango, and currently Biktarvy.    Subjective:    Chief Complaint  Patient presents with   Follow-up    HPI:  Ryan Cruz is a 57 y.o. male with HIV disease last seen on 09/15/21 with well-controlled virus and good adherence and tolerance to [employer].  Viral load was undetectable with CD4 count 531.  Renal function, hepatic function, and electrolytes within normal ranges.  Here today for follow-up.  Ryan Cruz has been doing okay since last office visit with less than optimal adherence and good tolerance to [place].  Has missed several doses.  Expresses concern regarding lab work and a bad experience with having blood work drawn while in the hospital.  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  Continues to smoke tobacco and marijuana.  Condoms and STD testing offered  Denies fevers, chills, night sweats, headaches, changes in vision, neck pain/stiffness, nausea, diarrhea, vomiting, lesions or rashes.   Allergies  Allergen Reactions   Shellfish Allergy Hives   Doxycycline Rash    Outpatient Medications Prior to Visit  Medication Sig Dispense Refill   aspirin EC 81 MG tablet Take 1 tablet (81 mg total) by mouth daily. Swallow whole. 30 tablet 6   atorvastatin (LIPITOR) 20 MG tablet Take 1 tablet (20 mg total) by mouth daily. 30 tablet 6   diphenhydrAMINE HCl (BENADRYL ALLERGY PO) Take by mouth.     bictegravir-emtricitabine-tenofovir AF (BIKTARVY) 50-200-25 MG TABS tablet Take 1 tablet by mouth daily. 30 tablet 5   No facility-administered medications prior  to visit.     Past Medical History:  Diagnosis Date   Anxiety    Asthma    Depression    HIV (human immunodeficiency virus infection) (HCC)    Hypertension    Hypoglycemia    sugars drop ion     Past Surgical History:  Procedure Laterality Date   BIOPSY  08/18/2019   Procedure: BIOPSY;  Surgeon: Lynann Bologna, MD;  Location: [facility] ENDOSCOPY;  Service: Endoscopy;;   COLONOSCOPY  02/12/2012   Procedure: COLONOSCOPY;  Surgeon: Romie Levee, MD;  Location: [facility] ENDOSCOPY;  Service: Endoscopy;  Laterality: N/A;   ESOPHAGOGASTRODUODENOSCOPY (EGD) WITH PROPOFOL N/A 08/18/2019   Procedure: ESOPHAGOGASTRODUODENOSCOPY (EGD) WITH PROPOFOL;  Surgeon: Lynann Bologna, MD;  Location: [facility] ENDOSCOPY;  Service: Endoscopy;  Laterality: N/A;   HERNIA REPAIR     umbilibcal      Review of Systems  Constitutional:  Negative for appetite change, chills, fatigue, fever and unexpected weight change.  Eyes:  Negative for visual disturbance.  Respiratory:  Negative for cough, chest tightness, shortness of breath and wheezing.   Cardiovascular:  Negative for chest pain and leg swelling.  Gastrointestinal:  Negative for abdominal pain, constipation, diarrhea, nausea and vomiting.  Genitourinary:  Negative for dysuria, flank pain, frequency, genital sores, hematuria and urgency.  Skin:  Negative for rash.  Allergic/Immunologic: Negative for immunocompromised state.  Neurological:  Negative for dizziness and headaches.      Objective:    BP (!) 145/81   Pulse 70   Temp 98 F (36.7 C) (Temporal)  Ht 5\' 7"  (1.702 m)   Wt 145 lb (65.8 kg)   SpO2 97%   BMI 22.71 kg/m  Nursing note and vital signs reviewed.  Physical Exam Constitutional:      General: He is not in acute distress.    Appearance: He is well-developed.  Eyes:     Conjunctiva/sclera: Conjunctivae normal.  Cardiovascular:     Rate and Rhythm: Normal rate and regular rhythm.     Heart sounds: Normal heart sounds. No murmur heard.    No  friction rub. No gallop.  Pulmonary:     Effort: Pulmonary effort is normal. No respiratory distress.     Breath sounds: Normal breath sounds. No wheezing or rales.  Chest:     Chest wall: No tenderness.  Abdominal:     General: Bowel sounds are normal.     Palpations: Abdomen is soft.     Tenderness: There is no abdominal tenderness.  Musculoskeletal:     Cervical back: Neck supple.  Lymphadenopathy:     Cervical: No cervical adenopathy.  Skin:    General: Skin is warm and dry.     Findings: No rash.  Neurological:     Mental Status: He is alert and oriented to person, place, and time.  Psychiatric:        Behavior: Behavior normal.        Thought Content: Thought content normal.        Judgment: Judgment normal.         06/29/2022   10:35 AM 09/15/2021    9:57 AM 04/09/2021    2:38 PM 07/31/2020   11:17 AM 06/07/2020    8:58 AM  Depression screen PHQ 2/9  Decreased Interest 0 0 0 1 0  Down, Depressed, Hopeless 0 0 0 1 0  PHQ - 2 Score 0 0 0 2 0  Altered sleeping    3   Tired, decreased energy    2   Change in appetite    1   Feeling bad or failure about yourself     2   Trouble concentrating    2   Moving slowly or fidgety/restless    2   Suicidal thoughts    1   PHQ-9 Score    15        Assessment & Plan:    Patient Active Problem List   Diagnosis Date Noted   Screening for STDs (sexually transmitted diseases) 09/15/2021   Therapeutic drug monitoring 09/15/2021   Giardia 04/22/2020   Protein-calorie malnutrition, severe 04/22/2020   Diarrhea 04/21/2020   Acute diffuse otitis externa of left ear 03/15/2020   Numbness of feet 03/11/2020   Intractable vomiting with nausea    Nausea vomiting and diarrhea    AKI (acute kidney injury) (HCC) 08/14/2019   Foot lesion 06/05/2019   Healthcare maintenance 04/26/2018   Hepatitis B immune 11/18/2016   Medically noncompliant    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    Other  headache syndrome    Optic neuritis 04/16/2016   Chest pain 11/20/2015   Hypoglycemia 01/29/2014   Allergic sinusitis 06/29/2012   Pilonidal cyst 01/04/2012   Depression 06/25/2010   CONDYLOMA ACUMINATUM 01/07/2010   Essential hypertension 12/12/2009   Ventral hernia 03/28/2009   NEUROPATHY, NOS 04/30/2008   DENTAL CARIES 12/14/2007   TOBACCO USER 06/01/2007   VERTIGO 05/13/2007   HIV infection (HCC) 02/24/2006   HYPERLIPIDEMIA 02/24/2006   Rectal bleeding 11/19/2005  ASTHMA 12/11/1994     Problem List Items Addressed This Visit       Other   HIV infection [facility])    Mr. Lefave likely has well-controlled virus with decent adherence and good tolerance to [place].  Discussed importance of taking medication daily as prescribed to reduce risk of resistance development.  Reviewed previous lab work, plan of care, and U=U. Check lab work. Continue current dose of Biktarvy. Plan for follow up in 2 months or sooner if needed with lab work on the same day.       Relevant Medications   bictegravir-emtricitabine-tenofovir AF (BIKTARVY) 50-200-25 MG TABS tablet   Other Relevant Orders   COMPLETE METABOLIC PANEL WITH GFR   HIV-1 RNA quant-no reflex-bld   T-helper cell (CD4)- ([facility] clinic only)   T-helper cells (CD4) count (not at [facility])   TOBACCO USER    Mr. Belisle continues to smoke tobacco daily.  Reviewed the recommendation for tobacco cessation to reduce risk of cardiovascular, respiratory, malignant, and renal disease in the future.  In the precontemplation stage and not ready to quit at this time.      Healthcare maintenance    Discussed importance of safe sexual practice and condom use. Condoms and STD testing offered.  Routine dental care up to date  Due for Prevnar 20 and tetanus at next visit.       Screening for STDs (sexually transmitted diseases) - Primary   Relevant Orders   RPR     I am having Ryan Cruz maintain his aspirin EC, atorvastatin, diphenhydrAMINE HCl  (BENADRYL ALLERGY PO), and bictegravir-emtricitabine-tenofovir AF.   Meds ordered this encounter  Medications   bictegravir-emtricitabine-tenofovir AF (BIKTARVY) 50-200-25 MG TABS tablet    Sig: Take 1 tablet by mouth daily.    Dispense:  30 tablet    Refill:  5    Order Specific Question:   Supervising Provider    Answer:   Judyann Munson [[id number]]     Follow-up: Return in about 2 months (around 08/29/2022), or if symptoms worsen or fail to improve.   Marcos Eke, MSN, FNP-C Nurse Practitioner [facility] Main number: [phone]

## 2022-06-29 NOTE — Assessment & Plan Note (Signed)
Discussed importance of safe sexual practice and condom use. Condoms and STD testing offered.  Routine dental care up to date  Due for Prevnar 20 and tetanus at next visit.

## 2022-06-30 LAB — T-HELPER CELLS (CD4) COUNT (NOT AT ARMC)
CD4 T Helper %: 31 % (ref 30–61)
Total lymphocyte count: 2087 cells/uL (ref 850–3900)

## 2022-07-02 LAB — COMPLETE METABOLIC PANEL WITH GFR
Albumin: 4.9 g/dL (ref 3.6–5.1)
BUN: 13 mg/dL (ref 7–25)
CO2: 26 mmol/L (ref 20–32)
Calcium: 9.7 mg/dL (ref 8.6–10.3)
Chloride: 107 mmol/L (ref 98–110)
Creat: 1.25 mg/dL (ref 0.70–1.30)
Potassium: 4.3 mmol/L (ref 3.5–5.3)
Sodium: 139 mmol/L (ref 135–146)
Total Bilirubin: 1.2 mg/dL (ref 0.2–1.2)
eGFR: 68 mL/min/{1.73_m2} (ref >=60)

## 2022-07-02 LAB — HIV-1 RNA QUANT-NO REFLEX-BLD
HIV 1 RNA Quant: 33 Copies/mL — ABNORMAL HIGH
HIV-1 RNA Quant, Log: 1.52 Log cps/mL — ABNORMAL HIGH

## 2022-07-02 LAB — T-HELPER CELLS (CD4) COUNT (NOT AT ARMC): Absolute CD4: 651 cells/uL (ref 490–1740)

## 2022-07-02 LAB — RPR: RPR Ser Ql: NONREACTIVE

## 2022-08-24 ENCOUNTER — Telehealth: Payer: Self-pay

## 2022-08-24 ENCOUNTER — Other Ambulatory Visit: Payer: Self-pay

## 2022-08-24 DIAGNOSIS — F331 Major depressive disorder, recurrent, moderate: Secondary | ICD-10-CM

## 2022-08-24 DIAGNOSIS — R440 Auditory hallucinations: Secondary | ICD-10-CM

## 2022-08-24 NOTE — Telephone Encounter (Signed)
Patient called complaining of hearing voices.  He reports that no one else in the home hears the voices except for him.  Patient states he began hearing voices mid may.  Patient advised to go to the ED to be evaluated. He states he does not wish to go to the ED because he feels they will "lock him up".  Patient referred to Psychiatry.  I also attempted to call him back and provide him with information for [facility], which is open 24 hours. Tammy Sours, NP aware.  Dusan Lipford T Pricilla Loveless

## 2022-08-24 NOTE — Progress Notes (Signed)
Patient called complaining of hearing voices.  He reports that no one else in the home hears the voices except for him.  Patient states he began hearing voices mid may.  Patient advised to go to the ED to be evaluated. He states he does not wish to go to the ED because he feels they will "lock him up".  Patient referred to Psychiatry.  I also attempted to call him back and provide him with information for [facility], which is open 24 hours.  Kaede Clendenen T Pricilla Loveless

## 2022-09-03 ENCOUNTER — Ambulatory Visit (HOSPITAL_COMMUNITY)
Admission: EM | Admit: 2022-09-03 | Discharge: 2022-09-04 | Disposition: A | Discharge status: Other Acute Inpatient Hospital | Payer: 59 | Attending: Psychiatry | Admitting: Psychiatry

## 2022-09-03 ENCOUNTER — Telehealth: Payer: Self-pay

## 2022-09-03 ENCOUNTER — Other Ambulatory Visit: Payer: Self-pay

## 2022-09-03 DIAGNOSIS — F32A Depression, unspecified: Secondary | ICD-10-CM | POA: Insufficient documentation

## 2022-09-03 DIAGNOSIS — R9431 Abnormal electrocardiogram [ECG] [EKG]: Secondary | ICD-10-CM | POA: Insufficient documentation

## 2022-09-03 DIAGNOSIS — B20 Human immunodeficiency virus [HIV] disease: Secondary | ICD-10-CM | POA: Diagnosis present

## 2022-09-03 DIAGNOSIS — Z21 Asymptomatic human immunodeficiency virus [HIV] infection status: Secondary | ICD-10-CM | POA: Diagnosis not present

## 2022-09-03 DIAGNOSIS — F1910 Other psychoactive substance abuse, uncomplicated: Secondary | ICD-10-CM | POA: Diagnosis not present

## 2022-09-03 DIAGNOSIS — F1210 Cannabis abuse, uncomplicated: Secondary | ICD-10-CM

## 2022-09-03 DIAGNOSIS — F17210 Nicotine dependence, cigarettes, uncomplicated: Secondary | ICD-10-CM | POA: Insufficient documentation

## 2022-09-03 DIAGNOSIS — R443 Hallucinations, unspecified: Secondary | ICD-10-CM

## 2022-09-03 DIAGNOSIS — F419 Anxiety disorder, unspecified: Secondary | ICD-10-CM | POA: Insufficient documentation

## 2022-09-03 DIAGNOSIS — F1510 Other stimulant abuse, uncomplicated: Secondary | ICD-10-CM | POA: Insufficient documentation

## 2022-09-03 DIAGNOSIS — E876 Hypokalemia: Secondary | ICD-10-CM | POA: Diagnosis not present

## 2022-09-03 DIAGNOSIS — R946 Abnormal results of thyroid function studies: Secondary | ICD-10-CM | POA: Diagnosis not present

## 2022-09-03 DIAGNOSIS — F1290 Cannabis use, unspecified, uncomplicated: Secondary | ICD-10-CM | POA: Diagnosis present

## 2022-09-03 DIAGNOSIS — F22 Delusional disorders: Secondary | ICD-10-CM | POA: Insufficient documentation

## 2022-09-03 DIAGNOSIS — Z720 Tobacco use: Secondary | ICD-10-CM

## 2022-09-03 MED ORDER — OLANZAPINE 5 MG PO TBDP: 5.0000 mg | ORAL_TABLET | Freq: Three times a day (TID) | ORAL | Status: DC | PRN

## 2022-09-03 MED ORDER — LORAZEPAM 1 MG PO TABS
1.0000 mg | ORAL_TABLET | ORAL | Status: DC | PRN
  Filled 2022-09-03: qty 1

## 2022-09-03 MED ORDER — OLANZAPINE 5 MG PO TABS
5.0000 mg | ORAL_TABLET | Freq: Every day | ORAL | Status: DC
  Administered 2022-09-04: 5 mg via ORAL
  Filled 2022-09-03 (×2): qty 1

## 2022-09-03 MED ORDER — BICTEGRAVIR-EMTRICITAB-TENOFOV 50-200-25 MG PO TABS
1.0000 | ORAL_TABLET | Freq: Every day | ORAL | Status: DC
  Administered 2022-09-04: 1 via ORAL
  Filled 2022-09-03: qty 1

## 2022-09-03 MED ORDER — LORAZEPAM 1 MG PO TABS
1.0000 mg | ORAL_TABLET | Freq: Once | ORAL | Status: AC
  Administered 2022-09-04: 1 mg via ORAL
  Filled 2022-09-03: qty 1

## 2022-09-03 MED ORDER — ALUM & MAG HYDROXIDE-SIMETH 200-200-20 MG/5ML PO SUSP: 30.0000 mL | ORAL | Status: DC | PRN

## 2022-09-03 MED ORDER — ACETAMINOPHEN 325 MG PO TABS: 650.0000 mg | ORAL_TABLET | Freq: Four times a day (QID) | ORAL | Status: DC | PRN

## 2022-09-03 MED ORDER — HYDROXYZINE HCL 25 MG PO TABS
25.0000 mg | ORAL_TABLET | Freq: Three times a day (TID) | ORAL | Status: DC | PRN
  Filled 2022-09-03: qty 1

## 2022-09-03 MED ORDER — TRAZODONE HCL 50 MG PO TABS
50.0000 mg | ORAL_TABLET | Freq: Every evening | ORAL | Status: DC | PRN
  Administered 2022-09-04: 50 mg via ORAL
  Filled 2022-09-03 (×2): qty 1

## 2022-09-03 MED ORDER — MAGNESIUM HYDROXIDE 400 MG/5ML PO SUSP: 30.0000 mL | Freq: Every day | ORAL | Status: DC | PRN

## 2022-09-03 MED ORDER — ZIPRASIDONE MESYLATE 20 MG IM SOLR: 20.0000 mg | INTRAMUSCULAR | Status: DC | PRN

## 2022-09-03 NOTE — Telephone Encounter (Signed)
Agree with plan as outlined. Thanks.

## 2022-09-03 NOTE — Telephone Encounter (Signed)
Patient called stating that he's hearing voices in his head again and that he isn't sleeping at night. Patient reports using marijuana and crystal meth. I asked patient if he has any thoughts of harming himself. Patient stated "No I don't want to hurt or kill myself I just want to be able to sleep and for the voices to stop in my head". I advised patient that he should go to the [facility] to be evaluated. Patient stated that he has an appointment with psychiatry on Tuesday and that he prefers to wait for his appointment. I voiced my understanding and stated I would relay the message to his provider.    Kendi Defalco Lesli Albee, CMA

## 2022-09-03 NOTE — Progress Notes (Signed)
   09/03/22 2221  [facility] Triage Screening (Walk-ins at [facility] only)  How Did You Hear About [place]? Family/Friend  What Is the Reason for Your Visit/Call Today? Pt arrives at [facility].  Pt says that he has been telling his family that "everywhere he goes he ses the same cars and people following him."  He knows the cars and license tag numbers and locations.  He says that people are hacking his electronics. Pt says that a neighbor upstairs at his residence has been filming him in his room.  He reports hearing sounds amplified in the motors of appliances in his home.  He is hearing people talking about him.  He says that "everyone knows when my wife married me that I like having sex with men."  He thinks that Haematologist and drug dealers.  Pt admits to having a gun.  Pt says that "I would have shot him" when referencing somone sneering at him in the [employer]..  Pt says he smokes marijuana daily.  he also smokes crystal meth and last time smoking meth was 07/20.  Patient denies any SI or HI.  He is hearing things and seeing things.  "I'm all ofer the place, happy then crhying tears."  Pt says he is depressed and anxious. Patient says he does not get sleep and appetite.  He says he "sits in a chair and smokes and cries."  Pt fatehr took patient's gun from him today.  Pt says he is HIV positive.  How Long Has This Been Causing You Problems? > than 6 months  Have You Recently Had Any Thoughts About Hurting Yourself? No  Are You Planning to Commit Suicide/Harm Yourself At This time? No  Have you Recently Had Thoughts About Hurting Someone [name]? No  Are You Planning To Harm Someone At This Time? No  Are you currently experiencing any auditory, visual or other hallucinations? Yes  Please explain the hallucinations you are currently experiencing: Hears voices and sees cars and people following him.  Have You Used Any Alcohol or Drugs in the Past 24 Hours? Yes  How long ago did you use Drugs or Alcohol? Smoked marijuana

## 2022-09-03 NOTE — ED Provider Notes (Signed)
[facility] Continuous Assessment Admission H&P  Date: 09/04/22 Patient Name: Ryan Cruz MRN: [id number] Chief Complaint: "I can't sleep, I hear voices all the time and people are following me everywhere condition.  Diagnoses:  Final diagnoses:  Delusional disorder (HCC)  Hallucinations  Paranoia (HCC)  Polysubstance abuse (HCC)  Marijuana abuse  Methamphetamine abuse (HCC)  Nicotine abuse    HPI: Ryan Cruz is a 57 year old male with medical history of HIV and psychiatric history of anxiety, depression, and  polysubstance abuse, who presented voluntarily as a walk-in to [facility], accompanied by his wife Ryan Cruz due to paranoid ideations, hallucinations and delusions.  Patient was seen face-to-face by this provider and chart reviewed.  Patient was evaluated separately from his wife.  Patient also gave permission for provider to speak with his wife in the lobby.  On evaluation, patient is alert, oriented x 3, and cooperative. Speech is clear, and tangential. Pt appears well groomed. Eye contact is fair. Mood is anxious, affect is constricted. Thought process is goal directed and circumstantial, and thought content is delusional, with paranoid ideations and obsessions. Pt denies SI/HI. Patient endorses AVH. There is no objective indication that the patient is responding to internal stimuli.   Patient reports "this is going to sound crazy to you, but for the past couple of months, I've been telling my family that everywhere I go, it's the same cars and same people and they are following me  everywhere, but they said I'm imagining things, but today, I couldn't get online to [employer] because those people have hacked my phone, my google account, facebook account, and my bank account, and I told them hackers can do all these through the electricity and also make sounds that come through the walls, and they are watching me through the TV and firestick, that's why I keep the room dark and have  blackout curtains on my windows because they can see me and my neighbor upstairs is filming me in my room through the walls by myself having fun and talking about me because I heard them one night by my window when it was open".  Patient continues "The refrigerator and dryer amplifies the sounds through the motor, that's how I know, and there are cars parked outside my street and when I open the door, they take off, and I keep hearing the neighbors talking about what I'm doing in my room, that's how I know they watching me, and my neighbor is the resident drug dealer, and one time when my wife and son was gone, and I was on the gay website [employer], my wife already know what I do before she married me, but I heard the neighbors talking about it, and asking me all these questions, and I went to the [employer] store with my mom and I saw a guy standing by the door talking about me on the phone, saying "yeah, he just got here and he got a gun", and I had my gun with me, and I felt like shooting him, because I don't fight, but I left and went to [employer] and the girl in the store saw me and and said " I saw the video", and she know that because they are everywhere watching me". " Also, my neighbors son have been following me and he's the one also filming me and I feel like shooting him, and a guy was sneering at me when I went to the store one time and I  was gonna shoot him, but I sneered back at him and left because I don't want no trouble, and I take my gun with me everywhere and I hide it in my butt, and this guy with a truck with words written on it with a sound, was talking about me because we had something in the past, and I could have shot him too because I'm familiar with the sound because I experienced it".  Patient endorses smoking cigarettes and weed daily "since I was 16".  Patient also endorses smoking crystal meth stating "it's my party drug and I last used on Saturday when I was having  sex".  Patient endorses owning a gun but reports his dad took the gun away from him.   Patient endorses auditory hallucinations ongoing since three years,  denies command hallucinations.  He also endorses depression for four years, stating, "for months ever since that guy got pissed at me and I've been majorly depressed and having those stupid crying spells, I'm all over the place, I'm happy one minute, next, I'm full of tears, that's why I stay in the dark with blackout windows and I don't watch TV because they watch me from the TV and my family say I'm paranoid, but I'm tired of fighting this because no one believes me, but if I go to sleep and don't wake up, I'll be fine with it, but I'm not trying to hurt myself or anybody unless they come for me first".  Patient reports "a few weeks ago, I took 4 muscle relaxants because I wanted to go to sleep, because if I lay down, they can see and hear me through infra red camera's, and I hear the voices, so I stay awake and watch for signs, and I have lost a lot of weight, and I'm trying to figure out why the white ford truck from my street was parked parallel from me at the hotel and I keep seeing it everywhere".  Patient denies a history of inpatient psychiatric admissions.  He is also not on any psychiatric medications at this time.  Patient reports his dad was able to get a new psychiatric appointment for him for next Tuesday, at a place on [address]  Collateral information was obtained from patient's father Ryan Cruz by phone and his wife Ryan Cruz is present. They report the patient is very paranoid and delusional, believes people are watching and following him everywhere, including inside his room. They reports the patient has a new psychiatric appointment for Tuesday, but he is having worsening paranoid ideations daily and other safety concerns, leading to [facility] visit tonight.   Support, encouragement and reassurance provided about  ongoing stressors. Patient provided with opportunity or questions.   Discussed recommendation for inpatient psychiatric hospitalization for stabilization and treatment. Discussed inpatient milieu and expectations. Patient is in agreement.  Patient will be admitted to the continuous observation unit overnight for safety monitoring pending transfer to an inpatient psychiatric unit. LCSW to seek bed [facility] the [facility] tonight.    Total Time spent with patient: 30 minutes  Musculoskeletal  Strength & Muscle Tone: within normal limits Gait & Station: normal Patient leans: N/A  Psychiatric Specialty Exam  Presentation General Appearance:  Appropriate for Environment  Eye Contact: Fair  Speech: Normal Rate  Speech Volume: Normal  Handedness: Right   Mood and Affect  Mood: Anxious  Affect: Constricted   Thought Process  Thought Processes: Goal Directed  Descriptions of Associations:Tangential  Orientation:Full (Time, Place and  Person)  Thought Content:Delusions; Obsessions; Paranoid Ideation  Diagnosis of Schizophrenia or Schizoaffective disorder in past: No   Hallucinations:Hallucinations: Auditory; Visual Description of Auditory Hallucinations: Patient reports hearing voices coming from the walls, airconditioner, etc Description of Visual Hallucinations: Patient reports seeing same people following him everywhere  Ideas of Reference:Delusions; Paranoia  Suicidal Thoughts:Suicidal Thoughts: No  Homicidal Thoughts:Homicidal Thoughts: No   Sensorium  Memory: Immediate Fair  Judgment: Poor  Insight: Poor   Executive Functions  Concentration: Fair  Attention Span: Fair  Recall: Fair  Fund of Knowledge: Fair  Language: Good   Psychomotor Activity  Psychomotor Activity: Psychomotor Activity: Mannerisms   Assets  Assets: Communication Skills; Desire for Improvement; Social Support   Sleep  Sleep: Sleep: Poor   Nutritional  Assessment (For OBS and FBC admissions only) Has the patient had a weight loss or gain of 10 pounds or more in the last 3 months?: No Has the patient had a decrease in food intake/or appetite?: No Does the patient have dental problems?: No Does the patient have eating habits or behaviors that may be indicators of an eating disorder including binging or inducing vomiting?: No Has the patient been eating poorly because of a decreased appetite?: 0    Physical Exam Constitutional:      General: He is not in acute distress.    Appearance: He is not diaphoretic.  HENT:     Head: Normocephalic.     Right Ear: External ear normal.     Left Ear: External ear normal.     Nose: No congestion.  Eyes:     General:        Right eye: No discharge.        Left eye: No discharge.  Pulmonary:     Effort: No respiratory distress.  Chest:     Chest wall: No tenderness.  Neurological:     Mental Status: He is alert. Mental status is at baseline.  Psychiatric:        Attention and Perception: He perceives auditory and visual hallucinations.        Mood and Affect: Mood is anxious. Affect is inappropriate.        Speech: Speech is tangential.        Behavior: Behavior is cooperative.        Thought Content: Thought content is paranoid and delusional. Thought content does not include homicidal or suicidal ideation. Thought content does not include homicidal or suicidal plan.        Judgment: Judgment is inappropriate.    Review of Systems  Constitutional:  Negative for chills, diaphoresis and fever.  HENT:  Negative for congestion.   Eyes:  Negative for discharge.  Respiratory:  Negative for cough, shortness of breath and wheezing.   Cardiovascular:  Negative for chest pain and palpitations.  Gastrointestinal:  Negative for diarrhea, nausea and vomiting.  Neurological:  Negative for dizziness, seizures, loss of consciousness, weakness and headaches.  Psychiatric/Behavioral:  Positive for  depression, hallucinations and substance abuse. The patient is nervous/anxious and has insomnia.     Blood pressure (!) 131/98, pulse 100, temperature 98.1 F (36.7 C), temperature source Oral, SpO2 100%. There is no height or weight on file to calculate BMI.  Past Psychiatric History: See H & P   Is the patient at risk to self? Yes  Has the patient been a risk to self in the past 6 months? Yes .    Has the patient been a risk to self within  the distant past? Yes   Is the patient a risk to others? Yes   Has the patient been a risk to others in the past 6 months? Yes   Has the patient been a risk to others within the distant past? Yes   Past Medical History: See Chart  Family History: N/A  Social History: N/A  Last Labs:  Admission on 09/03/2022  Component Date Value Ref Range Status   POC Amphetamine UR 09/03/2022 Positive (A)  NONE DETECTED (Cut Off Level 1000 ng/mL) Final   POC Secobarbital (BAR) 09/03/2022 None Detected  NONE DETECTED (Cut Off Level 300 ng/mL) Final   POC Buprenorphine (BUP) 09/03/2022 None Detected  NONE DETECTED (Cut Off Level 10 ng/mL) Final   POC Oxazepam (BZO) 09/03/2022 None Detected  NONE DETECTED (Cut Off Level 300 ng/mL) Final   POC Cocaine UR 09/03/2022 None Detected  NONE DETECTED (Cut Off Level 300 ng/mL) Final   POC Methamphetamine UR 09/03/2022 Positive (A)  NONE DETECTED (Cut Off Level 1000 ng/mL) Final   POC Morphine 09/03/2022 None Detected  NONE DETECTED (Cut Off Level 300 ng/mL) Final   POC Methadone UR 09/03/2022 None Detected  NONE DETECTED (Cut Off Level 300 ng/mL) Final   POC Oxycodone UR 09/03/2022 None Detected  NONE DETECTED (Cut Off Level 100 ng/mL) Final   POC Marijuana UR 09/03/2022 Positive (A)  NONE DETECTED (Cut Off Level 50 ng/mL) Final  Office Visit on 06/29/2022  Component Date Value Ref Range Status   Glucose, Bld 06/29/2022 76  65 - 99 mg/dL Final   Comment: .            Fasting reference interval .    BUN 06/29/2022 13   7 - 25 mg/dL Final   Creat 54/10/8117 1.25  0.70 - 1.30 mg/dL Final   eGFR 14/78/2956 68  > OR = 60 mL/min/1.60m2 Final   BUN/Creatinine Ratio 06/29/2022 SEE NOTE:  6 - 22 (calc) Final   Comment:    Not Reported: BUN and Creatinine are within    reference range. .    Sodium 06/29/2022 139  135 - 146 mmol/L Final   Potassium 06/29/2022 4.3  3.5 - 5.3 mmol/L Final   Chloride 06/29/2022 107  98 - 110 mmol/L Final   CO2 06/29/2022 26  20 - 32 mmol/L Final   Calcium 06/29/2022 9.7  8.6 - 10.3 mg/dL Final   Total Protein 21/30/8657 7.9  6.1 - 8.1 g/dL Final   Albumin 84/69/6295 4.9  3.6 - 5.1 g/dL Final   Globulin 28/41/3244 3.0  1.9 - 3.7 g/dL (calc) Final   AG Ratio 06/29/2022 1.6  1.0 - 2.5 (calc) Final   Total Bilirubin 06/29/2022 1.2  0.2 - 1.2 mg/dL Final   Alkaline phosphatase (APISO) 06/29/2022 64  35 - 144 U/L Final   AST 06/29/2022 18  10 - 35 U/L Final   ALT 06/29/2022 18  9 - 46 U/L Final   HIV 1 RNA Quant 06/29/2022 33 (H)  Copies/mL Final   HIV-1 RNA Quant, Log 06/29/2022 1.52 (H)  Log cps/mL Final   Comment: . Reference Range:                           Not Detected     copies/mL                           Not Detected Log copies/mL . Marland Kitchen  The test was performed using Real-Time Polymerase Chain Reaction. . . Reportable Range: 20 copies/mL to 10,000,000 copies/mL (1.30 Log copies/mL to 7.00 Log copies/mL). .    RPR Ser Ql 06/29/2022 NON-REACTIVE  NON-REACTIVE Final   Comment: . No laboratory evidence of syphilis. If recent exposure is suspected, submit a new sample in 2-4 weeks. .    CD4 T Helper % 06/29/2022 31  30 - 61 % Final   Absolute CD4 06/29/2022 651  490 - 1,740 cells/uL Final   Total lymphocyte count 06/29/2022 2,087  850 - 3,900 cells/uL Final   Comment: The analytical performance characteristics of this assay  have been determined by [employer]. The  modifications have not been cleared or approved by the  FDA. This assay has been validated  pursuant to the CLIA  regulations and is used for clinical purposes.     Allergies: Shellfish allergy and Doxycycline  Medications:  Facility Ordered Medications  Medication   acetaminophen (TYLENOL) tablet 650 mg   alum & mag hydroxide-simeth (MAALOX/MYLANTA) 200-200-20 MG/5ML suspension 30 mL   magnesium hydroxide (MILK OF MAGNESIA) suspension 30 mL   hydrOXYzine (ATARAX) tablet 25 mg   traZODone (DESYREL) tablet 50 mg   OLANZapine zydis (ZYPREXA) disintegrating tablet 5 mg   And   LORazepam (ATIVAN) tablet 1 mg   And   ziprasidone (GEODON) injection 20 mg   OLANZapine (ZYPREXA) tablet 5 mg   bictegravir-emtricitabine-tenofovir AF (BIKTARVY) 50-200-25 MG per tablet 1 tablet   [COMPLETED] LORazepam (ATIVAN) tablet 1 mg   PTA Medications  Medication Sig   aspirin EC 81 MG tablet Take 1 tablet (81 mg total) by mouth daily. Swallow whole.   atorvastatin (LIPITOR) 20 MG tablet Take 1 tablet (20 mg total) by mouth daily.   diphenhydrAMINE HCl (BENADRYL ALLERGY PO) Take by mouth.   bictegravir-emtricitabine-tenofovir AF (BIKTARVY) 50-200-25 MG TABS tablet Take 1 tablet by mouth daily.      Medical Decision Making  Recommend inpatient psych hospitalization for stabilization and treatment.  Patient admitted to continuous observation unit for safety monitoring pending transfer to an inpatient psychiatric unit.  Lab Orders         CBC with Differential/Platelet         Comprehensive metabolic panel         Hemoglobin A1c         Lipid panel         TSH         POCT Urine Drug Screen - (I-Screen)       EKG  Home medications restarted -Biktarvy 1 tablet p.o. daily for Human immunenodeficiency disease  Other meds started -Ativan 1 mg p.o. x 1 dose for anxiety/insomnia -Zyprexa 5 mg p.o. nightly for mood stabilization/psychosis  Agitation protocol medication -Zyprexa 5 mg p.o. every 8 hours as needed agitation -Ativan 1 mg p.o., as needed, anxiety, severe agitation for 1  dose -Geodon IM 20 mg, as needed, agitation for 1 dose  Order PRNs -Tylenol 650 mg p.o. every 6 hours as needed pain -Maalox 30 mL p.o. every 4 hours as needed indigestion -Atarax 25 mg p.o. 3 times daily as needed anxiety -MOM 30 mL p.o. daily as needed constipation -Trazodone 50 mg p.o. nightly as needed insomnia  Recommendations  Based on my evaluation the patient does not appear to have an emergency medical condition.  Recommend inpatient psych hospitalization for stabilization and treatment.  Mancel Bale, NP 09/04/22  12:36 AM

## 2022-09-04 ENCOUNTER — Inpatient Hospital Stay (HOSPITAL_COMMUNITY)
Admission: AD | Admit: 2022-09-04 | Discharge: 2022-09-08 | DRG: 897 | Disposition: A | Discharge status: Home / Self Care | Payer: 59 | Source: Intra-hospital | Attending: Psychiatry | Admitting: Psychiatry

## 2022-09-04 ENCOUNTER — Encounter (HOSPITAL_COMMUNITY): Payer: Self-pay | Admitting: Family Medicine

## 2022-09-04 ENCOUNTER — Encounter (HOSPITAL_COMMUNITY): Payer: Self-pay | Admitting: Emergency Medicine

## 2022-09-04 DIAGNOSIS — I10 Essential (primary) hypertension: Secondary | ICD-10-CM | POA: Diagnosis present

## 2022-09-04 DIAGNOSIS — G4700 Insomnia, unspecified: Secondary | ICD-10-CM | POA: Diagnosis not present

## 2022-09-04 DIAGNOSIS — F419 Anxiety disorder, unspecified: Secondary | ICD-10-CM | POA: Diagnosis present

## 2022-09-04 DIAGNOSIS — F1910 Other psychoactive substance abuse, uncomplicated: Secondary | ICD-10-CM | POA: Diagnosis not present

## 2022-09-04 DIAGNOSIS — F17200 Nicotine dependence, unspecified, uncomplicated: Secondary | ICD-10-CM | POA: Diagnosis present

## 2022-09-04 DIAGNOSIS — R030 Elevated blood-pressure reading, without diagnosis of hypertension: Secondary | ICD-10-CM | POA: Diagnosis not present

## 2022-09-04 DIAGNOSIS — E876 Hypokalemia: Secondary | ICD-10-CM | POA: Diagnosis not present

## 2022-09-04 DIAGNOSIS — F17210 Nicotine dependence, cigarettes, uncomplicated: Secondary | ICD-10-CM | POA: Diagnosis not present

## 2022-09-04 DIAGNOSIS — F22 Delusional disorders: Secondary | ICD-10-CM | POA: Diagnosis not present

## 2022-09-04 DIAGNOSIS — Z21 Asymptomatic human immunodeficiency virus [HIV] infection status: Secondary | ICD-10-CM | POA: Diagnosis not present

## 2022-09-04 DIAGNOSIS — F15959 Other stimulant use, unspecified with stimulant-induced psychotic disorder, unspecified: Secondary | ICD-10-CM | POA: Diagnosis not present

## 2022-09-04 DIAGNOSIS — K5900 Constipation, unspecified: Secondary | ICD-10-CM | POA: Diagnosis not present

## 2022-09-04 DIAGNOSIS — F332 Major depressive disorder, recurrent severe without psychotic features: Secondary | ICD-10-CM | POA: Insufficient documentation

## 2022-09-04 DIAGNOSIS — R946 Abnormal results of thyroid function studies: Secondary | ICD-10-CM | POA: Diagnosis not present

## 2022-09-04 DIAGNOSIS — F600 Paranoid personality disorder: Secondary | ICD-10-CM | POA: Diagnosis present

## 2022-09-04 DIAGNOSIS — Z720 Tobacco use: Secondary | ICD-10-CM | POA: Diagnosis not present

## 2022-09-04 DIAGNOSIS — F1590 Other stimulant use, unspecified, uncomplicated: Secondary | ICD-10-CM | POA: Insufficient documentation

## 2022-09-04 DIAGNOSIS — K30 Functional dyspepsia: Secondary | ICD-10-CM | POA: Diagnosis not present

## 2022-09-04 DIAGNOSIS — F15159 Other stimulant abuse with stimulant-induced psychotic disorder, unspecified: Secondary | ICD-10-CM | POA: Diagnosis not present

## 2022-09-04 DIAGNOSIS — F1290 Cannabis use, unspecified, uncomplicated: Secondary | ICD-10-CM | POA: Diagnosis present

## 2022-09-04 DIAGNOSIS — F1210 Cannabis abuse, uncomplicated: Secondary | ICD-10-CM | POA: Diagnosis not present

## 2022-09-04 DIAGNOSIS — F32A Depression, unspecified: Secondary | ICD-10-CM | POA: Diagnosis not present

## 2022-09-04 DIAGNOSIS — Z79899 Other long term (current) drug therapy: Secondary | ICD-10-CM | POA: Diagnosis not present

## 2022-09-04 DIAGNOSIS — R9431 Abnormal electrocardiogram [ECG] [EKG]: Secondary | ICD-10-CM | POA: Diagnosis not present

## 2022-09-04 DIAGNOSIS — F1510 Other stimulant abuse, uncomplicated: Secondary | ICD-10-CM | POA: Diagnosis not present

## 2022-09-04 LAB — POTASSIUM: Potassium: 3.6 mmol/L (ref 3.5–5.1)

## 2022-09-04 LAB — POCT URINE DRUG SCREEN - MANUAL ENTRY (I-SCREEN)
POC Amphetamine UR: POSITIVE — AB
POC Buprenorphine (BUP): NOT DETECTED
POC Cocaine UR: NOT DETECTED
POC Marijuana UR: POSITIVE — AB
POC Methadone UR: NOT DETECTED
POC Methamphetamine UR: POSITIVE — AB
POC Morphine: NOT DETECTED
POC Oxazepam (BZO): NOT DETECTED
POC Oxycodone UR: NOT DETECTED
POC Secobarbital (BAR): NOT DETECTED

## 2022-09-04 LAB — LIPID PANEL: VLDL: 35 mg/dL (ref 0–40)

## 2022-09-04 LAB — CBC WITH DIFFERENTIAL/PLATELET: MCV: 91.9 fL (ref 80.0–100.0)

## 2022-09-04 MED ORDER — POTASSIUM CHLORIDE CRYS ER 20 MEQ PO TBCR
40.0000 meq | EXTENDED_RELEASE_TABLET | Freq: Once | ORAL | Status: AC
  Administered 2022-09-04: 40 meq via ORAL
  Filled 2022-09-04: qty 2

## 2022-09-04 MED ORDER — ZIPRASIDONE MESYLATE 20 MG IM SOLR: 20.0000 mg | INTRAMUSCULAR | Status: DC | PRN

## 2022-09-04 MED ORDER — TRAZODONE HCL 50 MG PO TABS
50.0000 mg | ORAL_TABLET | Freq: Every evening | ORAL | Status: DC | PRN
  Administered 2022-09-04: 50 mg via ORAL
  Filled 2022-09-04: qty 1

## 2022-09-04 MED ORDER — ENSURE ENLIVE PO LIQD
237.0000 mL | Freq: Two times a day (BID) | ORAL | Status: DC
  Administered 2022-09-05 – 2022-09-07 (×5): 237 mL via ORAL
  Filled 2022-09-04 (×9): qty 237

## 2022-09-04 MED ORDER — ACETAMINOPHEN 325 MG PO TABS: 650.0000 mg | ORAL_TABLET | Freq: Four times a day (QID) | ORAL | Status: DC | PRN

## 2022-09-04 MED ORDER — HYDROXYZINE HCL 25 MG PO TABS
25.0000 mg | ORAL_TABLET | Freq: Three times a day (TID) | ORAL | Status: DC | PRN
  Administered 2022-09-04 – 2022-09-06 (×2): 25 mg via ORAL
  Filled 2022-09-04 (×2): qty 1

## 2022-09-04 MED ORDER — LORAZEPAM 1 MG PO TABS
1.0000 mg | ORAL_TABLET | ORAL | Status: AC | PRN
  Administered 2022-09-05: 1 mg via ORAL
  Filled 2022-09-04: qty 1

## 2022-09-04 MED ORDER — MAGNESIUM HYDROXIDE 400 MG/5ML PO SUSP: 30.0000 mL | Freq: Every day | ORAL | Status: DC | PRN

## 2022-09-04 MED ORDER — OLANZAPINE 5 MG PO TABS
5.0000 mg | ORAL_TABLET | Freq: Every day | ORAL | Status: DC
  Administered 2022-09-04 – 2022-09-05 (×2): 5 mg via ORAL
  Filled 2022-09-04 (×5): qty 1

## 2022-09-04 MED ORDER — NICOTINE 21 MG/24HR TD PT24
21.0000 mg | MEDICATED_PATCH | Freq: Every day | TRANSDERMAL | Status: DC
  Filled 2022-09-04 (×5): qty 1

## 2022-09-04 MED ORDER — BICTEGRAVIR-EMTRICITAB-TENOFOV 50-200-25 MG PO TABS
1.0000 | ORAL_TABLET | Freq: Every day | ORAL | Status: DC
  Administered 2022-09-05 – 2022-09-08 (×4): 1 via ORAL
  Filled 2022-09-04 (×5): qty 1

## 2022-09-04 MED ORDER — OLANZAPINE 5 MG PO TBDP
5.0000 mg | ORAL_TABLET | Freq: Three times a day (TID) | ORAL | Status: DC | PRN
  Administered 2022-09-05: 5 mg via ORAL
  Filled 2022-09-04: qty 1

## 2022-09-04 MED ORDER — ALUM & MAG HYDROXIDE-SIMETH 200-200-20 MG/5ML PO SUSP: 30.0000 mL | ORAL | Status: DC | PRN

## 2022-09-04 NOTE — ED Provider Notes (Cosign Needed Addendum)
FBC/OBS ASAP Discharge Summary  Date and Time: 09/04/2022 10:39 AM  Name: Ryan Cruz  MRN:  [id number]   Discharge Diagnoses:  Final diagnoses:  Delusional disorder (HCC)  Hallucinations  Paranoia (HCC)  Polysubstance abuse (HCC)  Marijuana abuse  Methamphetamine abuse (HCC)  Nicotine abuse  Hypokalemia  Asymptomatic HIV infection, with no history of HIV-related illness (HCC)    Subjective: "I've been hearing voices all my life"  Stay Summary: Ryan Cruz 57 year old male with a medical history of asymptomatic HIV (medication compliant and mental health history of  anxiety, depression, auditory hallucinations, and  polysubstance abuse (marijuana and methamphetamine) , admitted to continuous assessment unit 09/03/2022 due to worsening delusions, paranoia, auditory hallucinations. Patient was accompanied by his wife, who was also concerned that patient was increasingly paranoid thinking that his neighbors were filming him, concerned that the same car was following him around for several months, and had verbalized that he would "shoot" a person he thought was talking about him at a local mobile phone retail store. Patient endorses daily use of marijuana all of his life. Reports he began experimenting with Crystal Meth a little over 1 year ago. He reports use of meth only occurs when he is out "partying" as it helps elevate his mood. He reports not using meth weekly, however endorse meth use at least several times per month with last use 08/29/22. He denies that meth use is associated with the auditory hallucinations as he has heard voices all of his life, "however I've always been able to control them and now I can't". He has no inpatient psychiatric history and have never been prescribed psychiatric medications. Reports over the last 8 days being unable to sleep, pacing around, feeling fearful, and continues to hear constant voices. He had been unable to eat as he has been exhausted. He had  established with a mental health provider and had an upcoming appointment next week, but his wife and father convinced him to come here to [facility] for mental health crisis evaluation. His father also took possession of patient's gun. Patient reports that he feels his "mind is spinning out of control" and he wants help. Patient was give a dose of Ativan an achieved sleep overnight. He denies suicidal or homicidal ideations. He continues to endorses that he is hearing voices which is worsening his depression, because he is unable to control what he is hearing.  Patient accepted today to [facility] for inpatient psychiatric treatment.   Abnormal labs:  K+-2.9, replaced with 40 MEQ of Potassium, rechecked K+ improved 3.6  TSH: elevated  UDS + methamphetamines and THC  CT of HEAD and MRI Brain 2018, reviewed, no evidence of tumors or lesions. Negative for CVA   Total Time spent with patient: 45 minutes  Past Psychiatric History: Depression and Anxiety (See HPI for details)  Past Medical History:  Past Medical History:  Diagnosis Date   Anxiety    Asthma    Depression    HIV (human immunodeficiency virus infection) (HCC)    Hypertension    Hypoglycemia    sugars drop ion     Family History:  Family History  Problem Relation Age of Onset   Diabetes Mother    Hypertension Mother    Heart disease Mother    Congestive Heart Failure Mother    Diabetes Father    Hypertension Father    Diabetes Sister    HIV Sister    Hypertension Sister    Diabetes  Brother    Hypertension Brother    Diabetes Brother    Hypertension Brother    Diabetes Brother    Hypertension Brother    Diabetes Brother    Hypertension Brother      Family Psychiatric History: No pertinent family mental health history per patient Social History:  Social History   Socioeconomic History   Marital status: Married    Spouse name: Not on file   Number of children: Not on file   Years of education: Not on file    Highest education level: Not on file  Occupational History   Occupation: medication aide and hairstylist  Tobacco Use   Smoking status: Every Day    Current packs/day: 0.10    Average packs/day: 0.1 packs/day for 15.0 years (1.5 ttl pk-yrs)    Types: Cigarettes   Smokeless tobacco: Never   Tobacco comments:    States he uses cigarettes about twice a day, trying to quit  Substance and Sexual Activity   Alcohol use: Yes    Alcohol/week: 2.0 standard drinks of alcohol    Types: 1 Glasses of wine, 1 Shots of liquor per week    Comment: rarely   Drug use: Yes    Frequency: 7.0 times per week    Types: Marijuana    Comment: marijuana daily   Sexual activity: Yes    Partners: Male    Comment: declined condoms  Other Topics Concern   Not on file  Social History Narrative   Not on file   Social Determinants of Health   Financial Resource Strain: Not on file  Food Insecurity: Not on file  Transportation Needs: Not on file  Physical Activity: Not on file  Stress: Not on file  Social Connections: Not on file  Intimate Partner Violence: Not on file    Tobacco Cessation:  A prescription for an FDA-approved tobacco cessation medication provided at discharge  Current Medications:  Current Facility-Administered Medications  Medication Dose Route Frequency Provider Last Rate Last Admin   acetaminophen (TYLENOL) tablet 650 mg  650 mg Oral Q6H PRN Onuoha, Chinwendu V, NP       alum & mag hydroxide-simeth (MAALOX/MYLANTA) 200-200-20 MG/5ML suspension 30 mL  30 mL Oral Q4H PRN Onuoha, Chinwendu V, NP       bictegravir-emtricitabine-tenofovir AF (BIKTARVY) 50-200-25 MG per tablet 1 tablet  1 tablet Oral Daily Onuoha, Chinwendu V, NP   1 tablet at 09/04/22 0909   hydrOXYzine (ATARAX) tablet 25 mg  25 mg Oral TID PRN Onuoha, Chinwendu V, NP       OLANZapine zydis (ZYPREXA) disintegrating tablet 5 mg  5 mg Oral Q8H PRN Onuoha, Chinwendu V, NP       And   LORazepam (ATIVAN) tablet 1 mg  1 mg  Oral PRN Onuoha, Chinwendu V, NP       And   ziprasidone (GEODON) injection 20 mg  20 mg Intramuscular PRN Onuoha, Chinwendu V, NP       magnesium hydroxide (MILK OF MAGNESIA) suspension 30 mL  30 mL Oral Daily PRN Onuoha, Chinwendu V, NP       OLANZapine (ZYPREXA) tablet 5 mg  5 mg Oral QHS Onuoha, Chinwendu V, NP   5 mg at 09/04/22 0034   traZODone (DESYREL) tablet 50 mg  50 mg Oral QHS PRN Onuoha, Chinwendu V, NP   50 mg at 09/04/22 0035   Current Outpatient Medications  Medication Sig Dispense Refill   aspirin EC 81 MG tablet Take 1  tablet (81 mg total) by mouth daily. Swallow whole. 30 tablet 6   atorvastatin (LIPITOR) 20 MG tablet Take 1 tablet (20 mg total) by mouth daily. 30 tablet 6   bictegravir-emtricitabine-tenofovir AF (BIKTARVY) 50-200-25 MG TABS tablet Take 1 tablet by mouth daily. 30 tablet 5   diphenhydrAMINE HCl (BENADRYL ALLERGY PO) Take by mouth.      PTA Medications:  Facility Ordered Medications  Medication   acetaminophen (TYLENOL) tablet 650 mg   alum & mag hydroxide-simeth (MAALOX/MYLANTA) 200-200-20 MG/5ML suspension 30 mL   magnesium hydroxide (MILK OF MAGNESIA) suspension 30 mL   hydrOXYzine (ATARAX) tablet 25 mg   traZODone (DESYREL) tablet 50 mg   OLANZapine zydis (ZYPREXA) disintegrating tablet 5 mg   And   LORazepam (ATIVAN) tablet 1 mg   And   ziprasidone (GEODON) injection 20 mg   OLANZapine (ZYPREXA) tablet 5 mg   bictegravir-emtricitabine-tenofovir AF (BIKTARVY) 50-200-25 MG per tablet 1 tablet   [COMPLETED] LORazepam (ATIVAN) tablet 1 mg   [COMPLETED] potassium chloride SA (KLOR-CON M) CR tablet 40 mEq   PTA Medications  Medication Sig   aspirin EC 81 MG tablet Take 1 tablet (81 mg total) by mouth daily. Swallow whole.   atorvastatin (LIPITOR) 20 MG tablet Take 1 tablet (20 mg total) by mouth daily.   diphenhydrAMINE HCl (BENADRYL ALLERGY PO) Take by mouth.   bictegravir-emtricitabine-tenofovir AF (BIKTARVY) 50-200-25 MG TABS tablet Take 1  tablet by mouth daily.       06/29/2022   10:35 AM 09/15/2021    9:57 AM 04/09/2021    2:38 PM  Depression screen PHQ 2/9  Decreased Interest 0 0 0  Down, Depressed, Hopeless 0 0 0  PHQ - 2 Score 0 0 0    Flowsheet Row ED from 09/03/2022 in [facility] ED to Hosp-Admission (Discharged) from 04/21/2020 in [facility] GENERAL MED/SURG UNIT  C-SSRS RISK CATEGORY No Risk Error: Question 6 not populated       Musculoskeletal  Strength & Muscle Tone: within normal limits Gait & Station: normal Patient leans: N/A  Psychiatric Specialty Exam  Presentation  General Appearance:  Appropriate for Environment  Eye Contact: Fair  Speech: Normal Rate  Speech Volume: Normal  Handedness: Right   Mood and Affect  Mood: Anxious  Affect: Constricted   Thought Process  Thought Processes: Goal Directed  Descriptions of Associations:Tangential  Orientation:Full (Time, Place and Person)  Thought Content:Delusions; Obsessions; Paranoid Ideation  Diagnosis of Schizophrenia or Schizoaffective disorder in past: No  Duration of Psychotic Symptoms: Less than six months   Hallucinations:Hallucinations: Auditory; Visual Description of Auditory Hallucinations: Patient reports hearing voices coming from the walls, airconditioner, etc Description of Visual Hallucinations: Patient reports seeing same people following him everywhere  Ideas of Reference:Delusions; Paranoia  Suicidal Thoughts:Suicidal Thoughts: No  Homicidal Thoughts:Homicidal Thoughts: No   Sensorium  Memory: Immediate Fair  Judgment: Poor  Insight: Poor   Executive Functions  Concentration: Fair  Attention Span: Fair  Recall: Fair  Fund of Knowledge: Fair  Language: Good   Psychomotor Activity  Psychomotor Activity: Psychomotor Activity: Mannerisms   Assets  Assets: Communication Skills; Desire for Improvement; Social Support   Sleep   Sleep: Sleep: Poor   Nutritional Assessment (For OBS and FBC admissions only) Has the patient had a weight loss or gain of 10 pounds or more in the last 3 months?: No Has the patient had a decrease in food intake/or appetite?: No Does the patient have dental problems?: No  Does the patient have eating habits or behaviors that may be indicators of an eating disorder including binging or inducing vomiting?: No Has the patient been eating poorly because of a decreased appetite?: 0    Physical Exam  Physical Exam Vitals reviewed.  Constitutional:      Appearance: He is underweight.  HENT:     Head: Normocephalic and atraumatic.  Eyes:     Extraocular Movements: Extraocular movements intact.     Conjunctiva/sclera: Conjunctivae normal.     Pupils: Pupils are equal, round, and reactive to light.  Cardiovascular:     Rate and Rhythm: Normal rate and regular rhythm.  Pulmonary:     Effort: Pulmonary effort is normal.     Breath sounds: Normal breath sounds.  Musculoskeletal:     Cervical back: Normal range of motion and neck supple.  Neurological:     General: No focal deficit present.     Mental Status: He is alert and oriented to person, place, and time.      Review of Systems  Constitutional:  Positive for weight loss.  Psychiatric/Behavioral:  Positive for depression, hallucinations and substance abuse. Negative for memory loss and suicidal ideas. The patient has insomnia. The patient is not nervous/anxious.    Blood pressure 134/87, pulse 74, temperature 98 F (36.7 C), temperature source Oral, resp. rate 16, SpO2 95%. There is no height or weight on file to calculate BMI.  Demographic Factors:  Male, Cardell Peach, lesbian, or bisexual orientation, and Access to firearms  Loss Factors: NA  Historical Factors: NA  Risk Reduction Factors:   Positive social support  Continued Clinical Symptoms:  Currently Psychotic Medical Diagnoses and Treatments/Surgeries  Cognitive  Features That Contribute To Risk:  None    Suicide Risk:  Mild:  Suicidal ideation of limited frequency, intensity, duration, and specificity.  There are no identifiable plans, no associated intent, mild dysphoria and related symptoms, good self-control (both objective and subjective assessment), few other risk factors, and identifiable protective factors, including available and accessible social support.  Plan Of Care/Follow-up recommendations:  -Olanzapine 5 mg at bedtime started as a new medication this admission. -Trazodone 50 mg at bedtime and Hydroxyzine 25 mg TID PRN -BikTarvy Continued   Disposition: Transferring to [facility] for inpatient psychiatric treatment  Joaquin Courts, NP-C 09/04/2022, 10:39 AM

## 2022-09-04 NOTE — ED Notes (Signed)
Pt A&O x 4, presents with Paranoia, insomnia and anxiety.  Denies Si, HI or AVH.  Monitoring for safety.

## 2022-09-04 NOTE — Discharge Instructions (Signed)
Accepted to [facility]

## 2022-09-04 NOTE — Progress Notes (Signed)
Pt has been accepted to [facility] TODAY 09/04/2022, pending signed voluntary consent faxed to [phone]. Bed assignment: 400-2  Pt meets inpatient criteria per Joaquin Courts, NP  Attending Physician will be Phineas Inches, MD  Report can be called to: - -Adult unit: [phone]  Pt can arrive after pending items are received  Care Team Notified: [facility] Malva Limes, RN, Isaiah Serge, LPN, Joaquin Courts, NP, Thornton Dales, RN, and Roseanne Reno, RN  Sahan Pen, LCSW  09/04/2022 10:02 AM

## 2022-09-04 NOTE — ED Notes (Signed)
Provider notified of abnormal lab results. New order obtained
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
## 2022-09-04 NOTE — ED Notes (Signed)
Pt resting quietly with eyes closed.  No pain or discomfort noted/voiced.  Breathing is even and unlabored.  Will continue to monitor for safety.  

## 2022-09-04 NOTE — Progress Notes (Signed)
   09/04/22 2240  Psych Admission Type (Psych Patients Only)  Admission Status Voluntary  Psychosocial Assessment  Patient Complaints Anxiety;Depression;Irritability;Worrying  Eye Contact Brief  Facial Expression Anxious;Worried  Affect Anxious;Depressed;Fearful;Preoccupied  Speech Press photographer;Hand-wringing  Appearance/Hygiene Unremarkable  Behavior Characteristics Cooperative;Anxious  Mood Depressed;Anxious;Preoccupied  Aggressive Behavior  Effect No apparent injury  Thought Process  Coherency Circumstantial  Content Blaming others;Preoccupation;Paranoia  Delusions Paranoid  Perception Hallucinations  Hallucination Auditory;Visual  Judgment Impaired  Confusion None  Danger to Self  Current suicidal ideation? Denies  Agreement Not to Harm Self Yes  Description of Agreement verbal  Danger to Others  Danger to Others None reported or observed

## 2022-09-04 NOTE — ED Notes (Signed)

## 2022-09-04 NOTE — ED Notes (Signed)
Report was given to Jillian [email or web address]  ADULT UNIT
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
## 2022-09-04 NOTE — ED Notes (Signed)
Pt observed/assessed in recliner sleeping. RR even and unlabored, appearing in no noted distress. Environmental check complete, will continue to monitor for safety 

## 2022-09-05 DIAGNOSIS — F15959 Other stimulant use, unspecified with stimulant-induced psychotic disorder, unspecified: Principal | ICD-10-CM | POA: Insufficient documentation

## 2022-09-05 DIAGNOSIS — F1590 Other stimulant use, unspecified, uncomplicated: Secondary | ICD-10-CM | POA: Insufficient documentation

## 2022-09-05 MED ORDER — FLUOXETINE HCL 10 MG PO CAPS
10.0000 mg | ORAL_CAPSULE | Freq: Every day | ORAL | Status: DC
  Administered 2022-09-05 – 2022-09-08 (×4): 10 mg via ORAL
  Filled 2022-09-05 (×8): qty 1

## 2022-09-05 MED ORDER — TRAZODONE HCL 50 MG PO TABS
50.0000 mg | ORAL_TABLET | Freq: Every day | ORAL | Status: DC
  Administered 2022-09-05 – 2022-09-07 (×3): 50 mg via ORAL
  Filled 2022-09-05 (×4): qty 1

## 2022-09-05 NOTE — [facility] Suicide Risk Assessment (Signed)
Suicide Risk Assessment  Admission Assessment    [facility] Admission Suicide Risk Assessment   Nursing information obtained from:  Patient  Demographic factors:  Male, Ryan Cruz, lesbian, or bisexual orientation  Current Mental Status: Alert, oriented & aware of situation.  Loss Factors:  NA  Historical Factors:  NA  Risk Reduction Factors:  Living with another person, especially a relative  Total Time spent with patient: 1 hour  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(HCC)  Diagnosis:  Principal Problem:   Stimulant-induced psychotic disorder (HCC) Active Problems:   Delusional disorder (HCC)   Stimulant use disorder  Subjective Data: See H&P  Continued Clinical Symptoms:  Alcohol Use Disorder Identification Test Final Score (AUDIT): 4 The "Alcohol Use Disorders Identification Test", Guidelines for Use in Primary Care, Second Edition.  World Science writer [facility]). Score between 0-7:  no or low risk or alcohol related problems. Score between 8-15:  moderate risk of alcohol related problems. Score between 16-19:  high risk of alcohol related problems. Score 20 or above:  warrants further diagnostic evaluation for alcohol dependence and treatment.  CLINICAL FACTORS:   Depression:   Comorbid alcohol abuse/dependence Delusional Impulsivity Insomnia Alcohol/Substance Abuse/Dependencies More than one psychiatric diagnosis Currently Psychotic Medical Diagnoses and Treatments/Surgeries  Musculoskeletal: Strength & Muscle Tone: within normal limits Gait & Station: normal Patient leans: N/A  Psychiatric Specialty Exam:  Presentation  General Appearance:  Casual; Fairly Groomed  Eye Contact: Good  Speech: Clear and Coherent; Normal Rate  Speech Volume: Normal  Handedness: Right   Mood and Affect  Mood: -- (Reports feeing depressed.)  Affect: Appropriate; Non-Congruent   Thought Process  Thought Processes: Coherent; Goal  Directed  Descriptions of Associations:Circumstantial  Orientation:Full (Time, Place and Person)  Thought Content:Delusions; Paranoid Ideation  History of Schizophrenia/Schizoaffective disorder:No  Duration of Psychotic Symptoms:-- ("This has been going on x one year".)  Hallucinations:Hallucinations: Auditory; Visual Description of Auditory Hallucinations: "The voices are saying, "I'm gonna get you". Description of Visual Hallucinations: "I see the same cars evry where I go".  Ideas of Reference:Delusions; Paranoia  Suicidal Thoughts:Suicidal Thoughts: No  Homicidal Thoughts:Homicidal Thoughts: No   Sensorium  Memory: Immediate Fair; Recent Fair; Remote Fair  Judgment: Impaired  Insight: Shallow   Executive Functions  Concentration: Fair  Attention Span: Fair  Recall: Fair  Fund of Knowledge: Fair  Language: Good  Psychomotor Activity  Psychomotor Activity:Psychomotor Activity: Normal  Assets  Assets: Communication Skills; Desire for Improvement; Financial Resources/Insurance; Housing; Resilience; Social Support  Sleep  Sleep:Sleep: Poor Number of Hours of Sleep: 0 ("I have not slept in 8 days".)  Physical Exam: See H&P Blood pressure 110/69, pulse (!) 103, temperature 98.2 F (36.8 C), temperature source Oral, resp. rate 18, height 5\' 7"  (1.702 m), weight 62.1 kg, SpO2 99%. Body mass index is 21.46 kg/m.  COGNITIVE FEATURES THAT CONTRIBUTE TO RISK:  Closed-mindedness, Polarized thinking, and Thought constriction (tunnel vision)    SUICIDE RISK:   Currently denies any suicidal ideations, plans or intent. Denies any hx of attempts. Denies any suicides in his family.  PLAN OF CARE: See H&P.  I certify that inpatient services furnished can reasonably be expected to improve the patient's condition.   Armandina Stammer, NP, pmhnp, fnp-bc. 09/05/2022, 11:01 AM

## 2022-09-05 NOTE — Progress Notes (Signed)
Pt is awake and alert to person and place.  He has an anxious affect and suspicious mood.  Pt has been reluctant to leave his room and appears to be paranoid towards peers and staff.  Pt reports that he was given the wrong visitation code number and is upset about this.   Pt was given PRN Zyprexa for agitation which he took without much prompting.  He denies SI, HI and AVH at this time.   He does endorse being awake "for a few days".  Pt asked to sign the 72hour form and was given this to sign.   He signed it at 09/05/22 1315.  Nurse practitioner  Aggy  notified that pt is requesting his wife to call .

## 2022-09-05 NOTE — Plan of Care (Signed)
  Problem: Education: Goal: Emotional status will improve Outcome: Not Progressing Goal: Mental status will improve Outcome: Not Progressing   Problem: Activity: Goal: Sleeping patterns will improve Outcome: Progressing

## 2022-09-05 NOTE — [facility] Group Notes (Signed)
Pt did not attend am Goals/Community meeting group.

## 2022-09-05 NOTE — Progress Notes (Signed)
Pt currently resting quietly.  No distress noted.  Staff will cont to monitor for safety.

## 2022-09-05 NOTE — [facility] Group Notes (Signed)
[facility] Group Notes:  (Nursing/MHT/Case Management/Adjunct)  Date:  09/05/2022  Time:  2:07 PM  Type of Therapy:  Psychoeducational Skills  Participation Level:  Did Not Attend  Participation Quality:   na  Affect:   na  Cognitive:   na  Insight:  None  Engagement in Group:   na  Modes of Intervention:   na  Summary of Progress/Problems: A podcast from mental health professional Mel Sherral Hammers '' how to help your anxiety '' was played during group. Pt did not attend.  Ryan Cruz 09/05/2022, 2:07 PM
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
## 2022-09-05 NOTE — [facility] Group Notes (Signed)
[facility] Group Notes:  (Nursing/MHT/Case Management/Adjunct)  Date:  09/05/2022  Time:  9:46 PM  Type of Therapy:   Wrap-up group  Participation Level:  Did Not Attend  Participation Quality:    Affect:    Cognitive:    Insight:    Engagement in Group:    Modes of Intervention:    Summary of Progress/Problems: Pt refused to attend group.  Noah Delaine 09/05/2022, 9:46 PM

## 2022-09-05 NOTE — Plan of Care (Signed)
Problem: Education: Goal: Mental status will improve Outcome: Progressing Goal: Verbalization of understanding the information provided will improve Outcome: Progressing   Problem: Activity: Goal: Interest or engagement in activities will improve Outcome: Progressing Goal: Sleeping patterns will improve Outcome: Progressing   Problem: Coping: Goal: Ability to verbalize frustrations and anger appropriately will improve Outcome: Progressing Goal: Ability to demonstrate self-control will improve Outcome: Progressing   Problem: Health Behavior/Discharge Planning: Goal: Identification of resources available to assist in meeting health care needs will improve Outcome: Progressing Goal: Compliance with treatment plan for underlying cause of condition will improve Outcome: Progressing   Problem: Physical Regulation: Goal: Ability to maintain clinical measurements within normal limits will improve Outcome: Progressing   Problem: Safety: Goal: Periods of time without injury will increase Outcome: Progressing   Problem: Education: Goal: Knowledge of General Education information will improve Description: Including pain rating scale, medication(s)/side effects and non-pharmacologic comfort measures Outcome: Progressing   Problem: Health Behavior/Discharge Planning: Goal: Ability to manage health-related needs will improve Outcome: Progressing   Problem: Clinical Measurements: Goal: Ability to maintain clinical measurements within normal limits will improve Outcome: Progressing Goal: Will remain free from infection Outcome: Progressing Goal: Diagnostic test results will improve Outcome: Progressing Goal: Respiratory complications will improve Outcome: Progressing Goal: Cardiovascular complication will be avoided Outcome: Progressing   Problem: Activity: Goal: Risk for activity intolerance will decrease Outcome: Progressing   Problem: Nutrition: Goal: Adequate nutrition  will be maintained Outcome: Progressing   Problem: Coping: Goal: Level of anxiety will decrease Outcome: Progressing   Problem: Elimination: Goal: Will not experience complications related to bowel motility Outcome: Progressing Goal: Will not experience complications related to urinary retention Outcome: Progressing   Problem: Pain Managment: Goal: General experience of comfort will improve Outcome: Progressing   Problem: Safety: Goal: Ability to remain free from injury will improve Outcome: Progressing   Problem: Skin Integrity: Goal: Risk for impaired skin integrity will decrease Outcome: Progressing   Problem: Education: Goal: Utilization of techniques to improve thought processes will improve Outcome: Progressing Goal: Knowledge of the prescribed therapeutic regimen will improve Outcome: Progressing   Problem: Activity: Goal: Interest or engagement in leisure activities will improve Outcome: Progressing Goal: Imbalance in normal sleep/wake cycle will improve Outcome: Progressing   Problem: Coping: Goal: Coping ability will improve Outcome: Progressing Goal: Will verbalize feelings Outcome: Progressing   Problem: Health Behavior/Discharge Planning: Goal: Ability to make decisions will improve Outcome: Progressing Goal: Compliance with therapeutic regimen will improve Outcome: Progressing   Problem: Role Relationship: Goal: Will demonstrate positive changes in social behaviors and relationships Outcome: Progressing   Problem: Safety: Goal: Ability to disclose and discuss suicidal ideas will improve Outcome: Progressing Goal: Ability to identify and utilize support systems that promote safety will improve Outcome: Progressing   Problem: Self-Concept: Goal: Will verbalize positive feelings about self Outcome: Progressing Goal: Level of anxiety will decrease Outcome: Progressing   Problem: Education: Goal: Ability to state activities that reduce stress  will improve Outcome: Progressing   Problem: Coping: Goal: Ability to identify and develop effective coping behavior will improve Outcome: Progressing   Problem: Self-Concept: Goal: Ability to identify factors that promote anxiety will improve Outcome: Progressing Goal: Level of anxiety will decrease Outcome: Progressing Goal: Ability to modify response to factors that promote anxiety will improve Outcome: Progressing   Problem: Education: Goal: Knowledge of disease or condition will improve Outcome: Progressing Goal: Understanding of discharge needs will improve Outcome: Progressing   Problem: Health Behavior/Discharge Planning: Goal: Ability to identify changes  in lifestyle to reduce recurrence of condition will improve Outcome: Progressing Goal: Identification of resources available to assist in meeting health care needs will improve Outcome: Progressing

## 2022-09-06 DIAGNOSIS — F15959 Other stimulant use, unspecified with stimulant-induced psychotic disorder, unspecified: Secondary | ICD-10-CM

## 2022-09-06 LAB — T4, FREE: Free T4: 1.02 ng/dL (ref 0.61–1.12)

## 2022-09-06 LAB — TSH: TSH: 1.839 u[IU]/mL (ref 0.350–4.500)

## 2022-09-06 MED ORDER — OLANZAPINE 7.5 MG PO TABS
7.5000 mg | ORAL_TABLET | Freq: Every day | ORAL | Status: DC
  Administered 2022-09-06 – 2022-09-07 (×2): 7.5 mg via ORAL
  Filled 2022-09-06 (×4): qty 1

## 2022-09-06 MED ORDER — CLONIDINE HCL 0.1 MG PO TABS
0.1000 mg | ORAL_TABLET | Freq: Once | ORAL | Status: AC
  Administered 2022-09-06: 0.1 mg via ORAL
  Filled 2022-09-06 (×2): qty 1

## 2022-09-06 NOTE — [facility] Group Notes (Signed)
09/06/2022 11:00am  Type of Group and Topic: Psychoeducational Group:  Wellness  Participation Level:  did not attend  Description of Group  Wellness group introduces the topic and its focus on developing healthy habits across the spectrum and its relationship to a decrease in hospital admissions.  Six areas of wellness are discussed: physical, social spiritual, intellectual, occupational, and emotional.  Patients are asked to consider their current wellness habits and to identify areas of wellness where they are interested and able to focus on improvements.    Therapeutic Goals Patients will understand components of wellness and how they can positively impact overall health.  Patients will identify areas of wellness where they have developed good habits. Patients will identify areas of wellness where they would like to make improvements.    Summary of Patient Progress     Therapeutic Modalities: Cognitive Behavioral Therapy Psychoeducation    Lorri Frederick, LCSW

## 2022-09-06 NOTE — [facility] Suicide Risk Assessment (Signed)
[facility] INPATIENT:  Family/Significant Other Suicide Prevention Education  Suicide Prevention Education:  Contact Attempts: Erenest Rasher, step father, [phone], has been identified by the patient as the family member/significant other with whom the patient will be residing, and identified as the person(s) who will aid the patient in the event of a mental health crisis.  With written consent from the patient, two attempts were made to provide suicide prevention education, prior to and/or following the patient's discharge.  We were unsuccessful in providing suicide prevention education.  A suicide education pamphlet was given to the patient to share with family/significant other.  Date and time of first attempt:09/06/22, 1455 Date and time of second attempt:  Lorri Frederick 09/06/2022, 2:56 PM
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
## 2022-09-06 NOTE — Progress Notes (Signed)
   09/06/22 2224  Psych Admission Type (Psych Patients Only)  Admission Status Voluntary/72 hour document signed  Psychosocial Assessment  Patient Complaints Anxiety  Eye Contact Fair  Facial Expression Anxious  Affect Anxious  Speech Logical/coherent  Interaction Assertive  Motor Activity Slow  Appearance/Hygiene Unremarkable  Behavior Characteristics Cooperative;Guarded  Mood Anxious  Thought Process  Coherency WDL  Content Blaming others  Delusions None reported or observed  Perception WDL  Hallucination None reported or observed  Judgment Impaired  Confusion None  Danger to Self  Current suicidal ideation? Denies  Description of Agreement verbal contract for safety  Danger to Others  Danger to Others None reported or observed   D: Patient reports his anxiety is manageable and he is sleeping well now. Pt denies AVH and racing thoughts. Pt reports visit from wife today was great. Pt did not attend evening wrap up group.Marland Kitchen A: Medications administered as prescribed. Support and encouragement provided as needed.  R: Patient remains safe on the unit. Plan of care ongoing for safety and stability.

## 2022-09-06 NOTE — Progress Notes (Signed)
   09/06/22 0929  Psych Admission Type (Psych Patients Only)  Admission Status Voluntary/72 hour document signed  Psychosocial Assessment  Patient Complaints Anxiety  Eye Contact Avoids  Facial Expression Anxious  Affect Preoccupied;Anxious  Speech Logical/coherent  Interaction Assertive;Guarded  Motor Activity Slow  Appearance/Hygiene Unremarkable  Behavior Characteristics Cooperative;Guarded  Mood Anxious;Preoccupied  Thought Process  Coherency WDL  Content Blaming others;Paranoia  Delusions Paranoid  Perception Hallucinations  Hallucination Auditory  Judgment Poor  Confusion WDL  Danger to Self  Current suicidal ideation? Denies  Agreement Not to Harm Self Yes  Description of Agreement verbal  Danger to Others  Danger to Others None reported or observed

## 2022-09-06 NOTE — Progress Notes (Signed)
   09/05/22 2316  Psych Admission Type (Psych Patients Only)  Admission Status Voluntary  Psychosocial Assessment  Patient Complaints Anxiety;Sleep disturbance  Eye Contact Poor  Facial Expression Anxious;Worried  Affect Anxious;Preoccupied  Primary school teacher;Avoidant  Motor Activity Fidgety  Appearance/Hygiene Disheveled;Body odor  Behavior Characteristics Cooperative;Guarded  Mood Preoccupied;Anxious;Depressed  Thought Process  Coherency Circumstantial  Content Blaming others;Paranoia  Delusions Paranoid  Perception Hallucinations  Hallucination Auditory;Visual  Judgment Poor  Confusion WDL  Danger to Self  Current suicidal ideation? Denies  Danger to Others  Danger to Others None reported or observed   Upon initial assessment pt lying in bed with eyes closed. Pt refusing to come to group or snack. Pt received hs meds, currently denies SI/HI or hallucinations (a) 15 min checks (r) safety maintained.

## 2022-09-06 NOTE — Group Note (Signed)
Date:  09/06/2022 Time:  3:54 PM  Group Topic/Focus:  Wellness Toolbox:   The focus of this group is to discuss various aspects of wellness, balancing those aspects and exploring ways to increase the ability to experience wellness.  Patients will create a wellness toolbox for use upon discharge.    Participation Level:  Did Not Attend  Participation Quality:      Affect:      Cognitive:      Insight: Appropriate  Engagement in Group:  Engaged  Modes of Intervention:  Education  Additional Comments:     Reymundo Poll 09/06/2022, 3:54 PM

## 2022-09-06 NOTE — [facility] Group Notes (Signed)
[facility] Group Notes:  (Nursing/MHT/Case Management/Adjunct)  Date:  09/06/2022  Time:  9:23 PM  Type of Therapy:   Wrap Up Group  Participation Level:  didn't attend  Participation Quality:   n/a  Affect:   n/a  Cognitive:   n/a  Insight:  None  Engagement in Group:   n/a  Modes of Intervention:   n/a  Summary of Progress/Problems:  Tavonna Worthington E Dody Smartt 09/06/2022, 9:23 PM

## 2022-09-06 NOTE — Progress Notes (Cosign Needed Addendum)
[facility] MD Progress Note  09/06/2022 2:48 PM Ryan [facility]  MRN:  [id number]  Reason for admission: 57 year old AA male with no none hx of mental illnesses or treatments. 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 Admitted to the [facility] from the [facility] with complaints of worsening delusional thinking & paranoia (thinking that his neighbors were following him, filming him). He was also concerned that the same car was following him around for several months had verbalized that they would "shoot" him as well. Chart review also indicated patient has not slept well in a while, has been hearing voices & not eating well.   Daily notes: Ryan Cruz is seen in his room. Chart reviewed. The chart findings discussed with the treatment team. He presents alert, oriented & aware of situation. However, remains delusional & paranoid. He reports, "I'm not feeling depressed or happy. But I did sleep real good last night & I feel relaxed. I finally went to the cafeteria this morning for breakfast. While at the cafeteria, I saw two guys that look like the guys that were following me around. Then, the staff gave me a code they said to give to my family so that they can call me. Came to find out that the code was wrong. But the code numbers were from my wrist band & I could not figure out why they copied the wrong code. Now I'm beginning to wonder, who else is in on this?" Patient has signed a 72 hour discharge notice as of yesterday. And by all means, his paranoia is yet to clear. Patient is cooperative in taking his medications. He denies any side effects from his medications but remains preoccupied with the paranoid ideations. Patient is explained that we the [facility] staff apologized for the unintended mistake of copying him the wrong code number. However, he was instructed & explained that the numbers that was copied wrong were still numbers associated with him/his admission. That It was not a code that belonged  to anyone else but him. Patient states that he could see how that could happen, but should not have happened because the staff here are trained professionals. Patient currently denies any SIHI, VH. He remains with delusional thoughts/paranoia. He says his last auditory hallucination was last night. He says he heard a voice saying to him, "look at you". His Olanzapine is increased from 5 mg to 7.5 mg po Q hs to aid combat his symptoms. Continue current plan of care as already in progress. Reviewed lab results, Free T4 - 0.98, wnl. Reviewed vital signs, blood pressure, 148/93. Patient is in no apparent distress. Staff will recheck.  Collateral information obtained/provided by patient's wife Ryan Cruz via ph#: [phone]: She reports, "Ryan Cruz & I have been married for 30 years. I have never seen him like this. He has been feeling very paranoid & worried thinking that there were people following him around, wanting to kill him. As a result, Ryan Cruz has been edgy, nervous, pre-occupied & unable to sleep at night. He has been constantly watching/looking out the window, closing the blinds thinking he was being watched. This has been going on for 2 months. Came to find out this past Thursday that he has been using crystal meth for about 1 year. All this is new to me because he was not like this since I have been with him. He has never had any psychiatric treatment or hospitalizations until now. He also mentioned that he was given a  wrong code there at [facility] for [place] to call with which has made him more paranoid. FYI: Ryan Cruz informed me that he has signed a 72 hrs request for discharge. He does have an appointment with a psychiatrist on 09-08-22. His father has all the information about this appt. Ps, can the SW call the father to get this piece of information & to see if it can be rescheduled. We want him to get better first, then follow-up on outpatient basis.  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(HCC)  Diagnosis: Principal Problem:   Stimulant-induced psychotic disorder (HCC) Active Problems:   Delusional disorder (HCC)   Stimulant use disorder  Total Time spent with patient:  35 minutes  Past Psychiatric History: See H&P.  Past Medical History:  Past Medical History:  Diagnosis Date   Anxiety    Asthma    Depression    HIV (human immunodeficiency virus infection) (HCC)    Hypertension    Hypoglycemia    sugars drop ion    Past Surgical History:  Procedure Laterality Date   BIOPSY  08/18/2019   Procedure: BIOPSY;  Surgeon: Lynann Bologna, MD;  Location: [facility];  Service: Endoscopy;;   COLONOSCOPY  02/12/2012   Procedure: COLONOSCOPY;  Surgeon: Romie Levee, MD;  Location: [facility] ENDOSCOPY;  Service: Endoscopy;  Laterality: N/A;   ESOPHAGOGASTRODUODENOSCOPY (EGD) WITH PROPOFOL N/A 08/18/2019   Procedure: ESOPHAGOGASTRODUODENOSCOPY (EGD) WITH PROPOFOL;  Surgeon: Lynann Bologna, MD;  Location: [facility] END[facility]COPY;  Service: Endoscopy;  Laterality: N/A;   HERNIA REPAIR     umbilibcal   Family History:  Family History  Problem Relation Age of Onset   Diabetes Mother    Hypertension Mother    Heart disease Mother    Congestive Heart Failure Mother    Diabetes Father    Hypertension Father    Diabetes Sister    HIV Sister    Hypertension Sister    Diabetes Brother    Hypertension Brother    Diabetes Brother    Hypertension Brother    Diabetes Brother    Hypertension Brother    Diabetes Brother    Hypertension Brother    Family Psychiatric  History: See H&P.  Social History:  Social History   Substance and Sexual Activity  Alcohol Use Yes   Alcohol/week: 2.0 standard drinks of alcohol   Types: 1 Glasses of wine, 1 Shots of liquor per week   Comment: rarely     Social History   Substance and Sexual Activity  Drug Use Yes   Frequency: 7.0 times per week   Types: Marijuana   Comment: marijuana daily    Social History   Socioeconomic History   Marital  status: Married    Spouse name: Not on file   Number of children: Not on file   Years of education: Not on file   Highest education level: Not on file  Occupational History   Occupation: medication aide and hairstylist  Tobacco Use   Smoking status: Every Day    Current packs/day: 0.10    Average packs/day: 0.1 packs/day for 15.0 years (1.5 ttl pk-yrs)    Types: Cigarettes   Smokeless tobacco: Never   Tobacco comments:    States he uses cigarettes about twice a day, trying to quit  Substance and Sexual Activity   Alcohol use: Yes    Alcohol/week: 2.0 standard drinks of alcohol    Types: 1 Glasses of wine, 1 Shots of liquor per week    Comment: rarely  Drug use: Yes    Frequency: 7.0 times per week    Types: Marijuana    Comment: marijuana daily   Sexual activity: Yes    Partners: Male    Comment: declined condoms  Other Topics Concern   Not on file  Social History Narrative   Not on file   Social Determinants of Health   Financial Resource Strain: Not on file  Food Insecurity: No Food Insecurity (09/04/2022)   Hunger Vital Sign    Worried About Running Out of Food in the Last Year: Never true    Ran Out of Food in the Last Year: Never true  Transportation Needs: No Transportation Needs (09/04/2022)   PRAPARE - Administrator, Civil Service (Medical): No    Lack of Transportation (Non-Medical): No  Physical Activity: Not on file  Stress: Not on file  Social Connections: Not on file   Additional Social History:   Sleep: Good  Appetite:  Good  Current Medications: Current Facility-Administered Medications  Medication Dose Route Frequency Provider Last Rate Last Admin   acetaminophen (TYLENOL) tablet 650 mg  650 mg Oral Q6H PRN Bing Neighbors, NP       acetaminophen (TYLENOL) tablet 650 mg  650 mg Oral Q6H PRN Bing Neighbors, NP       alum & mag hydroxide-simeth (MAALOX/MYLANTA) 200-200-20 MG/5ML suspension 30 mL  30 mL Oral Q4H PRN Bing Neighbors, NP       alum & mag hydroxide-simeth (MAALOX/MYLANTA) 200-200-20 MG/5ML suspension 30 mL  30 mL Oral Q4H PRN Bing Neighbors, NP       bictegravir-emtricitabine-tenofovir AF (BIKTARVY) 50-200-25 MG per tablet 1 tablet  1 tablet Oral Daily Bing Neighbors, NP   1 tablet at 09/06/22 0749   feeding supplement (ENSURE ENLIVE / ENSURE PLUS) liquid 237 mL  237 mL Oral BID BM Massengill, Harrold Donath, MD   237 mL at 09/06/22 1011   FLUoxetine (PROZAC) capsule 10 mg  10 mg Oral Daily Armandina Stammer I, NP   10 mg at 09/06/22 0749   hydrOXYzine (ATARAX) tablet 25 mg  25 mg Oral TID PRN Bing Neighbors, NP   25 mg at 09/04/22 2216   magnesium hydroxide (MILK OF MAGNESIA) suspension 30 mL  30 mL Oral Daily PRN Bing Neighbors, NP       magnesium hydroxide (MILK OF MAGNESIA) suspension 30 mL  30 mL Oral Daily PRN Bing Neighbors, NP       nicotine (NICODERM CQ - dosed in mg/24 hours) patch 21 mg  21 mg Transdermal Daily Bing Neighbors, NP       OLANZapine (ZYPREXA) tablet 7.5 mg  7.5 mg Oral QHS Delbert Vu I, NP       OLANZapine zydis (ZYPREXA) disintegrating tablet 5 mg  5 mg Oral Q8H PRN Bing Neighbors, NP   5 mg at 09/05/22 1321   And   ziprasidone (GEODON) injection 20 mg  20 mg Intramuscular PRN Bing Neighbors, NP       traZODone (DESYREL) tablet 50 mg  50 mg Oral QHS Armandina Stammer I, NP   50 mg at 09/05/22 2045    Lab Results:  Results for orders placed or performed during the [facility] encounter of 09/04/22 (from the past 48 hour(s))  T4, free     Status: None   Collection Time: 09/05/22  6:41 AM  Result Value Ref Range   Free T4 0.98 0.61 - 1.12  ng/dL    Comment: (NOTE) Biotin ingestion may interfere with free T4 tests. If the results are inconsistent with the TSH level, previous test results, or the clinical presentation, then consider biotin interference. If needed, order repeat testing after stopping biotin. Performed at [facility], [address] [place], [place] [address]     Blood Alcohol level:  No results found for: "ETH"  Metabolic Disorder Labs: Lab Results  Component Value Date   HGBA1C 5.4 09/03/2022   MPG 108 09/03/2022   MPG 108 03/11/2020   No results found for: "PROLACTIN" Lab Results  Component Value Date   CHOL 193 09/03/2022   TRIG 176 (H) 09/03/2022   HDL 58 09/03/2022   CHOLHDL 3.3 09/03/2022   VLDL 35 09/03/2022   LDLCALC 100 (H) 09/03/2022   LDLCALC 130 (H) 05/23/2020    Physical Findings: AIMS:  , ,  ,  ,    CIWA:  CIWA-Ar Total: 3 COWS:     Musculoskeletal: Strength & Muscle Tone: within normal limits Gait & Station: normal Patient leans: N/A  Psychiatric Specialty Exam:  Presentation  General Appearance:  Casual; Fairly Groomed  Eye Contact: Good  Speech: Clear and Coherent; Normal Rate  Speech Volume: Normal  Handedness: Right   Mood and Affect  Mood: -- (Reports feeing depressed.)  Affect: Appropriate; Non-Congruent   Thought Process  Thought Processes: Coherent; Goal Directed  Descriptions of Associations:Circumstantial  Orientation:Full (Time, Place and Person)  Thought Content:Delusions; Paranoid Ideation  History of Schizophrenia/Schizoaffective disorder:No  Duration of Psychotic Symptoms:-- ("This has been going on x one year".)  Hallucinations:Hallucinations: Auditory; Visual Description of Auditory Hallucinations: "The voices are saying, "I'm gonna get you". Description of Visual Hallucinations: "I see the same cars evry where I go".  Ideas of Reference:Delusions; Paranoia  Suicidal Thoughts:Suicidal Thoughts: No  Homicidal Thoughts:Homicidal Thoughts: No   Sensorium  Memory: Immediate Fair; Recent Fair; Remote Fair  Judgment: Impaired  Insight: Shallow   Executive Functions  Concentration: Fair  Attention Span: Fair  Recall: Fair  Fund of Knowledge: Fair  Language: Good   Psychomotor Activity  Psychomotor  Activity: Psychomotor Activity: Normal   Assets  Assets: Communication Skills; Desire for Improvement; Financial Resources/Insurance; Housing; Resilience; Social Support   Sleep  Sleep: Sleep: Good Number of Hours of Sleep: 8.5  Physical Exam: Physical Exam Vitals and nursing note reviewed.  HENT:     Mouth/Throat:     Pharynx: Oropharynx is clear.  Cardiovascular:     Pulses: Normal pulses.  Pulmonary:     Effort: Pulmonary effort is normal.  Genitourinary:    Comments: Deferred Musculoskeletal:        General: Normal range of motion.     Cervical back: Normal range of motion.  Skin:    General: Skin is warm and dry.  Neurological:     General: No focal deficit present.     Mental Status: He is alert and oriented to person, place, and time.    Review of Systems  Constitutional:  Negative for chills and fever.  HENT:  Negative for congestion and sore throat.   Respiratory:  Negative for cough, shortness of breath and wheezing.   Cardiovascular:  Negative for chest pain and palpitations.  Gastrointestinal:  Negative for abdominal pain, constipation, diarrhea, heartburn, nausea and vomiting.  Musculoskeletal:  Negative for joint pain and myalgias.  Neurological:  Negative for dizziness, tingling, tremors, sensory change, speech change, focal weakness, seizures, loss of consciousness, weakness and headaches.  Psychiatric/Behavioral:  Positive for hallucinations and substance abuse. Negative for memory loss. The patient is nervous/anxious. The patient does not have insomnia.    Blood pressure (!) 148/93, pulse 83, temperature 98.3 F (36.8 C), temperature source Oral, resp. rate 18, height 5\' 7"  (1.702 m), weight 62.1 kg, SpO2 100%. Body mass index is 21.46 kg/m.  Treatment Plan Summary: Daily contact with patient to assess and evaluate symptoms and progress in treatment and Medication management.   Principal/active diagnoses.  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   Associated symptoms.  Hallucinations (auditory/visual).  Delusional thought.  Paranoia.  Insomnia.  Plan: The risks/benefits/side-effects/alternatives to the medications in use were discussed in detail with the patient and time was given for patient's questions. The patient consents to medication trial.    -Increased Olanzapine from 5 mg to 7.5 mg po Q hs for psychosis.  -Continue Hydroxyzine 25 mg po tid prn for anxiety.  -Continue Trazodone 50 mg po Q hs for insomnia.  -Continue Nicotine patch 21 mg topically for nicotine withdrawal.  -Continue Prozac 10 mg po daily for depression.   Labs: Will order  T3, pending T4 - 0.98, wnl  A repeat TSH result pending.   Agitation protocols.  -Zyprexa zydis 5 mg tid prn. -& -Lorazepam 1 mg po prn x 1.  -& Geodon 20 mg IM prn x 1 dose.   Other medical issues:  -Continue BIKTARVY 50-200-25 mg po daily for HIV. -Continue Ensure plus 237 ml po bid bw meals.   Other PRNS -Continue Tylenol 650 mg every 6 hours PRN for mild pain -Continue Maalox 30 ml Q 4 hrs PRN for indigestion -Continue MOM 30 ml po Q 6 hrs for constipation.  -Clonidine 0.1 mg po once for elevated blood pressure.   Safety and Monitoring: Voluntary admission to inpatient psychiatric unit for safety, stabilization and treatment Daily contact with patient to assess and evaluate symptoms and progress in treatment Patient's case to be discussed in multi-disciplinary team meeting Observation Level : q15 minute checks Vital signs: q12 hours Precautions: Safety   Discharge Planning: Social work and case management to assist with discharge planning and identification of [facility] follow-up needs prior to discharge Estimated LOS: 5-7 days Discharge Concerns: Need to establish a safety plan; Medication compliance and effectiveness Discharge Goals: Return home with outpatient referrals for mental health follow-up  including medication management/psychotherapy  Armandina Stammer, NP, pmhnp, fnp-bc 09/06/2022, 2:48 PM

## 2022-09-07 ENCOUNTER — Encounter (HOSPITAL_COMMUNITY): Payer: Self-pay

## 2022-09-07 NOTE — BH IP Treatment Plan (Signed)
Interdisciplinary Treatment and Diagnostic Plan Update  09/07/2022 Time of Session: 11:00am Ryan Cruz MRN: [id number]  Principal Diagnosis: Stimulant-induced psychotic disorder [facility])  Secondary Diagnoses: Principal Problem:   Stimulant-induced psychotic disorder (HCC) Active Problems:   Delusional disorder (HCC)   Stimulant use disorder   Current Medications:  Current Facility-Administered Medications  Medication Dose Route Frequency Provider Last Rate Last Admin   acetaminophen (TYLENOL) tablet 650 mg  650 mg Oral Q6H PRN Bing Neighbors, NP       acetaminophen (TYLENOL) tablet 650 mg  650 mg Oral Q6H PRN Bing Neighbors, NP       alum & mag hydroxide-simeth (MAALOX/MYLANTA) 200-200-20 MG/5ML suspension 30 mL  30 mL Oral Q4H PRN Bing Neighbors, NP       alum & mag hydroxide-simeth (MAALOX/MYLANTA) 200-200-20 MG/5ML suspension 30 mL  30 mL Oral Q4H PRN Bing Neighbors, NP       bictegravir-emtricitabine-tenofovir AF (BIKTARVY) 50-200-25 MG per tablet 1 tablet  1 tablet Oral Daily Bing Neighbors, NP   1 tablet at 09/07/22 0818   feeding supplement (ENSURE ENLIVE / ENSURE PLUS) liquid 237 mL  237 mL Oral BID BM Massengill, Harrold Donath, MD   237 mL at 09/07/22 1100   FLUoxetine (PROZAC) capsule 10 mg  10 mg Oral Daily Armandina Stammer I, NP   10 mg at 09/07/22 0818   hydrOXYzine (ATARAX) tablet 25 mg  25 mg Oral TID PRN Bing Neighbors, NP   25 mg at 09/06/22 1642   magnesium hydroxide (MILK OF MAGNESIA) suspension 30 mL  30 mL Oral Daily PRN Bing Neighbors, NP       magnesium hydroxide (MILK OF MAGNESIA) suspension 30 mL  30 mL Oral Daily PRN Bing Neighbors, NP       nicotine (NICODERM CQ - dosed in mg/24 hours) patch 21 mg  21 mg Transdermal Daily Bing Neighbors, NP       OLANZapine (ZYPREXA) tablet 7.5 mg  7.5 mg Oral QHS Nwoko, Agnes I, NP   7.5 mg at 09/06/22 2116   OLANZapine zydis (ZYPREXA) disintegrating tablet 5 mg  5 mg Oral Q8H PRN Bing Neighbors,  NP   5 mg at 09/05/22 1321   And   ziprasidone (GEODON) injection 20 mg  20 mg Intramuscular PRN Bing Neighbors, NP       traZODone (DESYREL) tablet 50 mg  50 mg Oral QHS Armandina Stammer I, NP   50 mg at 09/06/22 2116   PTA Medications: Medications Prior to Admission  Medication Sig Dispense Refill Last Dose   bictegravir-emtricitabine-tenofovir AF (BIKTARVY) 50-200-25 MG TABS tablet Take 1 tablet by mouth daily. 30 tablet 5     Patient Stressors:    Patient Strengths:    Treatment Modalities: Medication Management, Group therapy, Case management,  1 to 1 session with clinician, Psychoeducation, Recreational therapy.   Physician Treatment Plan for Primary Diagnosis: Stimulant-induced psychotic disorder (HCC) Long Term Goal(s): Improvement in symptoms so as ready for discharge   Short Term Goals: Ability to identify and develop effective coping behaviors will improve Ability to maintain clinical measurements within normal limits will improve Compliance with prescribed medications will improve Ability to identify triggers associated with substance abuse/mental health issues will improve Ability to identify changes in lifestyle to reduce recurrence of condition will improve Ability to verbalize feelings will improve Ability to disclose and discuss suicidal ideas Ability to demonstrate self-control will improve  Medication Management: Evaluate patient's response,  side effects, and tolerance of medication regimen.  Therapeutic Interventions: 1 to 1 sessions, Unit Group sessions and Medication administration.  Evaluation of Outcomes: Progressing  Physician Treatment Plan for Secondary Diagnosis: Principal Problem:   Stimulant-induced psychotic disorder (HCC) Active Problems:   Delusional disorder (HCC)   Stimulant use disorder  Long Term Goal(s): Improvement in symptoms so as ready for discharge   Short Term Goals: Ability to identify and develop effective coping behaviors  will improve Ability to maintain clinical measurements within normal limits will improve Compliance with prescribed medications will improve Ability to identify triggers associated with substance abuse/mental health issues will improve Ability to identify changes in lifestyle to reduce recurrence of condition will improve Ability to verbalize feelings will improve Ability to disclose and discuss suicidal ideas Ability to demonstrate self-control will improve     Medication Management: Evaluate patient's response, side effects, and tolerance of medication regimen.  Therapeutic Interventions: 1 to 1 sessions, Unit Group sessions and Medication administration.  Evaluation of Outcomes: Progressing   RN Treatment Plan for Primary Diagnosis: Stimulant-induced psychotic disorder (HCC) Long Term Goal(s): Knowledge of disease and therapeutic regimen to maintain health will improve  Short Term Goals: Ability to remain free from injury will improve, Ability to verbalize frustration and anger appropriately will improve, Ability to demonstrate self-control, Ability to participate in decision making will improve, Ability to verbalize feelings will improve, Ability to disclose and discuss suicidal ideas, Ability to identify and develop effective coping behaviors will improve, and Compliance with prescribed medications will improve  Medication Management: RN will administer medications as ordered by provider, will assess and evaluate patient's response and provide education to patient for prescribed medication. RN will report any adverse and/or side effects to prescribing provider.  Therapeutic Interventions: 1 on 1 counseling sessions, Psychoeducation, Medication administration, Evaluate responses to treatment, Monitor vital signs and CBGs as ordered, Perform/monitor CIWA, COWS, AIMS and Fall Risk screenings as ordered, Perform wound care treatments as ordered.  Evaluation of Outcomes:  Progressing   LCSW Treatment Plan for Primary Diagnosis: Stimulant-induced psychotic disorder [facility]) Long Term Goal(s): Safe transition to appropriate next level of care at discharge, Engage patient in therapeutic group addressing interpersonal concerns.  Short Term Goals: Engage patient in aftercare planning with referrals and resources, Increase social support, Increase ability to appropriately verbalize feelings, Increase emotional regulation, Facilitate acceptance of mental health diagnosis and concerns, Facilitate patient progression through stages of change regarding substance use diagnoses and concerns, Identify triggers associated with mental health/substance abuse issues, and Increase skills for wellness and recovery  Therapeutic Interventions: Assess for all discharge needs, 1 to 1 time with Social worker, Explore available resources and support systems, Assess for adequacy in community support network, Educate family and significant other(s) on suicide prevention, Complete Psychosocial Assessment, Interpersonal group therapy.  Evaluation of Outcomes: Progressing   Progress in Treatment: Attending groups: No. Participating in groups: No. Taking medication as prescribed: Yes. Toleration medication: Yes. Family/Significant other contact made: Yes, individual(s) contacted:  will contact when consents are signed Patient understands diagnosis: Yes. Discussing patient identified problems/goals with staff: Yes. Medical problems stabilized or resolved: Yes. Denies suicidal/homicidal ideation: Yes. Issues/concerns per patient self-inventory: No. Other: no  New problem(s) identified: No, Describe:  none reported  New Short Term/Long Term Goal(s): Medication stabilization, anxiety  Patient Goals:  Stop hearing voices, get some rest, stop feeling anxious   Discharge Plan or Barriers:  Patient recently admitted. CSW will continue to follow and assess for appropriate referrals and  possible  discharge planning.   Reason for Continuation of Hospitalization: Anxiety Hallucinations Mania Medication stabilization  Estimated Length of Stay: 3-7 days  Last 3 [place] Suicide Severity Risk Score: Flowsheet Row [facility] (Current[facility] from 09/03/2022 in [facility] to Hosp-Admission (Discharged) from 04/21/2020 in [facility] GENERAL MED/SURG UNIT  C-SSRS RISK CATEGORY No Risk No Risk Error: Question 6 not populated       Last [facility] 2/9 Scores:    06/29/2022   10:35 AM 09/15/2021    9:57 AM 04/09/2021    2:38 PM  Depression screen PHQ 2/9  Decreased Interest 0 0 0  Down, Depressed, Hopeless 0 0 0  PHQ - 2 Score 0 0 0    Scribe for Treatment Team: Charise Killian 09/07/2022 2:44 PM

## 2022-09-07 NOTE — Progress Notes (Signed)
   09/07/22 1300  Psych Admission Type (Psych Patients Only)  Admission Status Voluntary/72 hour document signed  Psychosocial Assessment  Patient Complaints Anxiety;Depression  Eye Contact Brief  Facial Expression Flat  Affect Flat  Speech Logical/coherent  Interaction Minimal  Motor Activity Other (Comment) (wnl)  Appearance/Hygiene Unremarkable  Behavior Characteristics Cooperative;Guarded  Mood Preoccupied  Thought Process  Coherency WDL  Content WDL  Delusions None reported or observed  Perception WDL  Hallucination None reported or observed  Judgment Impaired  Confusion None  Danger to Self  Current suicidal ideation? Denies  Agreement Not to Harm Self Yes  Description of Agreement verbal  Danger to Others  Danger to Others None reported or observed

## 2022-09-07 NOTE — Progress Notes (Signed)
[facility] MD Progress Note  09/07/2022 4:53 PM Ryan Cruz  MRN:  [id number]  Reason for admission: 57 year old AA male with no none hx of mental illnesses or treatments. He does hx of substance use disorder that includes [facility] & methamphetamine). Admitted to the [facility] from the [facility] with complaints of worsening delusional thinking & paranoia (thinking that his neighbors were following him, filming him). He was also concerned that the same car was following him around for several months had verbalized that they would "shoot" him as well. Chart review also indicated patient has not slept well in a while, has been hearing voices & not eating well.   Yesterday the psychiatry team made the following recommendations:  -Continue Olanzapine 7.5 mg po Q hs for psychosis.  -Continue Hydroxyzine 25 mg po tid prn for anxiety.  -Continue Trazodone 50 mg po Q hs for insomnia.  -Continue Nicotine patch 21 mg topically for nicotine withdrawal.  -Continue Prozac 10 mg po daily for depression.  On assessment today, the pt reports that his mood is euthymic, improved since admission, and stable. Denies feeling down, depressed,or sad.  Added that he is ready to be discharged since he has already signed a 72-hour for discharge.  Patient needs discussed with attending psychiatrist. As per my evaluation, patient is stable enough to be discharged and the team is in agreement to discharge the patient tomorrow. Reports that anxiety symptoms are at manageable level and rates symptoms as #2/10, with 10 being high severity Sleep is stable. Appetite is stable.  Concentration is without complaint.  Energy level is adequate. Denies having any suicidal thoughts. Denies having any suicidal intent and plan.  Denies having any HI.  Denies having psychotic symptoms of auditory/visual hallucination x 2 days.  Reported paranoia is minimized and denies delusional thinking.  Denies having side effects to current psychiatric medications.    Discussed discharge planning: How to identify the signs of impending crisis, use of internal coping strategies, reaching out to friends and family can help  navigate a crisis, and a list of mental health professionals and agencies to call.  09/06/22 Collateral information obtained/provided by patient's wife Ryan Cruz via ph#: [phone]: She reports, "Ryan Cruz & I have been married for 30 years. I have never seen him like this. He has been feeling very paranoid & worried thinking that there were people following him around, wanting to kill him. As a result, Ryan Cruz has been edgy, nervous, pre-occupied & unable to sleep at night. He has been constantly watching/looking out the window, closing the blinds thinking he was being watched. This has been going on for 2 months. Came to find out this past Thursday that he has been using crystal meth for about 1 year. All this is new to me because he was not like this since I have been with him. He has never had any psychiatric treatment or hospitalizations until now. He also mentioned that he was given a wrong code there at [facility] for [place] to call with which has made him more paranoid. FYI: Ryan Cruz informed me that he has signed a 72 hrs request for discharge. He does have an appointment with a psychiatrist on 09-08-22. His father has all the information about this appt. Ps, can the SW call the father to get this piece of information & to see if it can be rescheduled. We want him to get better first, then follow-up on outpatient basis.  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(HCC)  Diagnosis: Principal  Problem:   Stimulant-induced psychotic disorder [facility]) Active Problems:   Delusional disorder (HCC)   Stimulant use disorder  Total Time spent with patient:  35 minutes  Past Psychiatric History: See H&P.  Past Medical History:  Past Medical History:  Diagnosis Date   Anxiety    Asthma    Depression    HIV (human immunodeficiency virus  infection) (HCC)    Hypertension    Hypoglycemia    sugars drop ion    Past Surgical History:  Procedure Laterality Date   BIOPSY  08/18/2019   Procedure: BIOPSY;  Surgeon: Lynann Bologna, MD;  Location: [facility] ENDOSCOPY;  Service: Endoscopy;;   COLONOSCOPY  02/12/2012   Procedure: COLONOSCOPY;  Surgeon: Romie Levee, MD;  Location: [facility] ENDOSCOPY;  Service: Endoscopy;  Laterality: N/A;   ESOPHAGOGASTRODUODENOSCOPY (EGD) WITH PROPOFOL N/A 08/18/2019   Procedure: ESOPHAGOGASTRODUODENOSCOPY (EGD) WITH PROPOFOL;  Surgeon: Lynann Bologna, MD;  Location: [facility] ENDOSCOPY;  Service: Endoscopy;  Laterality: N/A;   HERNIA REPAIR     umbilibcal   Family History:  Family History  Problem Relation Age of Onset   Diabetes Mother    Hypertension Mother    Heart disease Mother    Congestive Heart Failure Mother    Diabetes Father    Hypertension Father    Diabetes Sister    HIV Sister    Hypertension Sister    Diabetes Brother    Hypertension Brother    Diabetes Brother    Hypertension Brother    Diabetes Brother    Hypertension Brother    Diabetes Brother    Hypertension Brother    Family Psychiatric  History: See H&P.  Social History:  Social History   Substance and Sexual Activity  Alcohol Use Yes   Alcohol/week: 2.0 standard drinks of alcohol   Types: 1 Glasses of wine, 1 Shots of liquor per week   Comment: rarely     Social History   Substance and Sexual Activity  Drug Use Yes   Frequency: 7.0 times per week   Types: Marijuana   Comment: marijuana daily    Social History   Socioeconomic History   Marital status: Married    Spouse name: Not on file   Number of children: Not on file   Years of education: Not on file   Highest education level: Not on file  Occupational History   Occupation: medication aide and hairstylist  Tobacco Use   Smoking status: Every Day    Current packs/day: 0.10    Average packs/day: 0.1 packs/day for 15.0 years (1.5 ttl pk-yrs)    Types: Cigarettes    Smokeless tobacco: Never   Tobacco comments:    States he uses cigarettes about twice a day, trying to quit  Substance and Sexual Activity   Alcohol use: Yes    Alcohol/week: 2.0 standard drinks of alcohol    Types: 1 Glasses of wine, 1 Shots of liquor per week    Comment: rarely   Drug use: Yes    Frequency: 7.0 times per week    Types: Marijuana    Comment: marijuana daily   Sexual activity: Yes    Partners: Male    Comment: declined condoms  Other Topics Concern   Not on file  Social History Narrative   Not on file   Social Determinants of Health   Financial Resource Strain: Not on file  Food Insecurity: No Food Insecurity (09/04/2022)   Hunger Vital Sign    Worried About Running Out of Food  in the Last Year: Never true    Ran Out of Food in the Last Year: Never true  Transportation Needs: No Transportation Needs (09/04/2022)   PRAPARE - Administrator, Civil Service (Medical): No    Lack of Transportation (Non-Medical): No  Physical Activity: Not on file  Stress: Not on file  Social Connections: Not on file   Additional Social History:   Sleep: Good  Appetite:  Good  Current Medications: Current Facility-Administered Medications  Medication Dose Route Frequency Provider Last Rate Last Admin   acetaminophen (TYLENOL) tablet 650 mg  650 mg Oral Q6H PRN Bing Neighbors, NP       acetaminophen (TYLENOL) tablet 650 mg  650 mg Oral Q6H PRN Bing Neighbors, NP       alum & mag hydroxide-simeth (MAALOX/MYLANTA) 200-200-20 MG/5ML suspension 30 mL  30 mL Oral Q4H PRN Bing Neighbors, NP       alum & mag hydroxide-simeth (MAALOX/MYLANTA) 200-200-20 MG/5ML suspension 30 mL  30 mL Oral Q4H PRN Bing Neighbors, NP       bictegravir-emtricitabine-tenofovir AF (BIKTARVY) 50-200-25 MG per tablet 1 tablet  1 tablet Oral Daily Bing Neighbors, NP   1 tablet at 09/07/22 0818   feeding supplement (ENSURE ENLIVE / ENSURE PLUS) liquid 237 mL  237 mL Oral BID  BM Massengill, Harrold Donath, MD   237 mL at 09/07/22 1100   FLUoxetine (PROZAC) capsule 10 mg  10 mg Oral Daily Armandina Stammer I, NP   10 mg at 09/07/22 0818   hydrOXYzine (ATARAX) tablet 25 mg  25 mg Oral TID PRN Bing Neighbors, NP   25 mg at 09/06/22 1642   magnesium hydroxide (MILK OF MAGNESIA) suspension 30 mL  30 mL Oral Daily PRN Bing Neighbors, NP       magnesium hydroxide (MILK OF MAGNESIA) suspension 30 mL  30 mL Oral Daily PRN Bing Neighbors, NP       nicotine (NICODERM CQ - dosed in mg/24 hours) patch 21 mg  21 mg Transdermal Daily Bing Neighbors, NP       OLANZapine (ZYPREXA) tablet 7.5 mg  7.5 mg Oral QHS Nwoko, Agnes I, NP   7.5 mg at 09/06/22 2116   OLANZapine zydis (ZYPREXA) disintegrating tablet 5 mg  5 mg Oral Q8H PRN Bing Neighbors, NP   5 mg at 09/05/22 1321   And   ziprasidone (GEODON) injection 20 mg  20 mg Intramuscular PRN Bing Neighbors, NP       traZODone (DESYREL) tablet 50 mg  50 mg Oral QHS Armandina Stammer I, NP   50 mg at 09/06/22 2116    Lab Results:  Results for orders placed or performed during the hospital encounter of 09/04/22 (from the past 48 hour(s))  T4, free     Status: None   Collection Time: 09/06/22  6:20 PM  Result Value Ref Range   Free T4 1.02 0.61 - 1.12 ng/dL    Comment: (NOTE) Biotin ingestion may interfere with free T4 tests. If the results are inconsistent with the TSH level, previous test results, or the clinical presentation, then consider biotin interference. If needed, order repeat testing after stopping biotin. Performed at [facility], [address], [place], [place] [address]   TSH     Status: None   Collection Time: 09/06/22  6:20 PM  Result Value Ref Range   TSH 1.839 0.350 - 4.500 uIU/mL  Comment: Performed by a 3rd Generation assay with a functional sensitivity of <=0.01 uIU/mL. Performed at [facility], [address], [place], [place] [address]     Blood Alcohol level:  No  results found for: "[facility]"  Metabolic Disorder Labs: Lab Results  Component Value Date   HGBA1C 5.4 09/03/2022   MPG 108 09/03/2022   MPG 108 03/11/2020   No results found for: "PROLACTIN" Lab Results  Component Value Date   CHOL 193 09/03/2022   TRIG 176 (H) 09/03/2022   HDL 58 09/03/2022   CHOLHDL 3.3 09/03/2022   VLDL 35 09/03/2022   LDLCALC 100 (H) 09/03/2022   LDLCALC 130 (H) 05/23/2020    Physical Findings: AIMS:  , ,  ,  ,    CIWA:  CIWA-Ar Total: 3 COWS:     Musculoskeletal: Strength & Muscle Tone: within normal limits Gait & Station: normal Patient leans: N/A  Psychiatric Specialty Exam:  Presentation  General Appearance:  Casual; Fairly Groomed  Eye Contact: Good  Speech: Clear and Coherent  Speech Volume: Normal  Handedness: Right  Mood and Affect  Mood: Anxious; Depressed  Affect: Appropriate; Congruent  Thought Process  Thought Processes: Coherent; Goal Directed  Descriptions of Associations:Intact  Orientation:Full (Time, Place and Person)  Thought Content:Logical; Rumination  History of Schizophrenia/Schizoaffective disorder:No  Duration of Psychotic Symptoms:N/A (Report hearing voices x 1 year however has not had any voices x 2 days)  Hallucinations:Hallucinations: Auditory Description of Auditory Hallucinations: Denies auditory hallucination x 2 days Description of Visual Hallucinations: Denies visual hallucinations x 2 days  Ideas of Reference:None  Suicidal Thoughts:Suicidal Thoughts: No  Homicidal Thoughts:Homicidal Thoughts: No  Sensorium  Memory: Immediate Fair; Recent Fair  Judgment: Fair  Insight: Shallow  Executive Functions  Concentration: Fair  Attention Span: Fair  Recall: Fair  Fund of Knowledge: Fair  Language: Good  Psychomotor Activity  Psychomotor Activity: Psychomotor Activity: Normal  Assets  Assets: Communication Skills; Desire for Improvement; Physical Health; Resilience;  Social Support  Sleep  Sleep: Sleep: Good Number of Hours of Sleep: 7  Physical Exam: Physical Exam Vitals and nursing note reviewed.  HENT:     Head: Normocephalic.     Nose: Nose normal.     Mouth/Throat:     Mouth: Mucous membranes are moist.     Pharynx: Oropharynx is clear.  Eyes:     Extraocular Movements: Extraocular movements intact.     Pupils: Pupils are equal, round, and reactive to light.  Cardiovascular:     Rate and Rhythm: Normal rate.     Pulses: Normal pulses.     Comments: Blood pressure 139/92, pulse 69.  Nursing staff to recheck vital signs. Pulmonary:     Effort: Pulmonary effort is normal.  Abdominal:     Comments: Deferred  Genitourinary:    Comments: Deferred Musculoskeletal:        General: Normal range of motion.     Cervical back: Normal range of motion.  Skin:    General: Skin is warm and dry.  Neurological:     General: No focal deficit present.     Mental Status: He is alert and oriented to person, place, and time.  Psychiatric:        Behavior: Behavior normal.   Review of Systems  Constitutional:  Negative for chills and fever.  HENT:  Negative for congestion and sore throat.   Respiratory:  Negative for cough, shortness of breath and wheezing.   Cardiovascular:  Negative for chest  pain and palpitations.  Gastrointestinal:  Negative for abdominal pain, constipation, diarrhea, heartburn, nausea and vomiting.  Musculoskeletal:  Negative for joint pain and myalgias.  Skin:  Negative for itching and rash.  Neurological:  Negative for dizziness, tingling, tremors, sensory change, speech change, focal weakness, seizures, loss of consciousness, weakness and headaches.  Endo/Heme/Allergies:        See allergy listing  Psychiatric/Behavioral:  Positive for hallucinations. Negative for memory loss. The patient is nervous/anxious. The patient does not have insomnia.    Blood pressure (!) 139/92, pulse 69, temperature 98.2 F (36.8 C),  temperature source Oral, resp. rate 16, height 5\' 7"  (1.702 m), weight 62.1 kg, SpO2 100%. Body mass index is 21.46 kg/m.  Treatment Plan Summary: Daily contact with patient to assess and evaluate symptoms and progress in treatment and Medication management.   Principal/active diagnoses.  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   Associated symptoms.  Hallucinations (auditory/visual).  Delusional thought.  Paranoia.  Insomnia.  Plan: The risks/benefits/side-effects/alternatives to the medications in use were discussed in detail with the patient and time was given for patient's questions. The patient consents to medication trial.    -Continue Olanzapine 7.5 mg po Q hs for psychosis.  -Continue Hydroxyzine 25 mg po tid prn for anxiety.  -Continue Trazodone 50 mg po Q hs for insomnia.  -Continue Nicotine patch 21 mg topically for nicotine withdrawal.  -Continue Prozac 10 mg po daily for depression.   Labs: Will order  T3, pending T4 - 0.98, wnl   A repeat TSH result pending: results 1.839, WNL   Agitation protocols.  -Zyprexa zydis 5 mg tid prn. -& -Lorazepam 1 mg po prn x 1.  -& Geodon 20 mg IM prn x 1 dose.   Other medical issues:  -Continue BIKTARVY 50-200-25 mg po daily for HIV. -Continue Ensure plus 237 ml po bid bw meals.   Other PRNS -Continue Tylenol 650 mg every 6 hours PRN for mild pain -Continue Maalox 30 ml Q 4 hrs PRN for indigestion -Continue MOM 30 ml po Q 6 hrs for constipation.  -Clonidine 0.1 mg po once for elevated blood pressure.   Safety and Monitoring: Voluntary admission to inpatient psychiatric unit for safety, stabilization and treatment Daily contact with patient to assess and evaluate symptoms and progress in treatment Patient's case to be discussed in multi-disciplinary team meeting Observation Level : q15 minute checks Vital signs: q12 hours Precautions: Safety   Discharge  Planning: Social work and case management to assist with discharge planning and identification of hospital follow-up needs prior to discharge Estimated LOS: 5-7 days Discharge Concerns: Need to establish a safety plan; Medication compliance and effectiveness Discharge Goals: Return home with outpatient referrals for mental health follow-up including medication management/psychotherapy  Cecilie Lowers, FNP, pmhnp, fnp-bc 09/07/2022, 4:53 PM Patient ID: Ryan Cruz, male   DOB: 11/12/1965, 57 y.o.   MRN: [id number]

## 2022-09-07 NOTE — Plan of Care (Signed)
  Problem: Education: Goal: Knowledge of [facility] Education information/materials will improve Outcome: Progressing Goal: Emotional status will improve Outcome: Progressing Goal: Mental status will improve Outcome: Progressing Goal: Verbalization of understanding the information provided will improve Outcome: Progressing   Problem: Activity: Goal: Interest or engagement in activities will improve Outcome: Progressing   

## 2022-09-07 NOTE — Group Note (Signed)
Recreation Therapy Group Note   Group Topic:Health and Wellness  Group Date: 09/07/2022 Start Time: 0930 End Time: 1000 Facilitators: Marynell Bies-McCall, LRT,CTRS Location: [address] Dayroom   Goal Area(s) Addresses:  Patient will identify positive stress management techniques. Patient will identify benefits of using stress management post d/c.     Group Description: Stress Management. LRT and patients discussed the importance of stress management.  Patients were to complete a worksheet that describing their largest source of stress in detail. Patient then needed to list two stressors they are currently experiencing and then circle the symptoms they experience in response to their stress. LRT also played music as patients completed the worksheet.   Affect/Mood: N/A   Participation Level: Did not attend    Clinical Observations/Individualized Feedback:    Plan: Continue to engage patient in RT group sessions 2-3x/week.   Layna Roeper-McCall, LRT,CTRS 09/07/2022 1:14 PM

## 2022-09-07 NOTE — Plan of Care (Signed)
  Problem: Education: Goal: Verbalization of understanding the information provided will improve Outcome: Progressing   Problem: Activity: Goal: Interest or engagement in activities will improve Outcome: Not Progressing

## 2022-09-07 NOTE — Progress Notes (Signed)
   09/07/22 2240  Psych Admission Type (Psych Patients Only)  Admission Status Voluntary/72 hour document signed  Psychosocial Assessment  Patient Complaints Anxiety  Eye Contact Fair  Facial Expression Anxious  Affect Appropriate to circumstance  Speech Logical/coherent  Interaction Minimal;Cautious  Motor Activity Other (Comment)  Appearance/Hygiene Unremarkable  Behavior Characteristics Cooperative  Mood Preoccupied  Thought Process  Coherency WDL  Content WDL  Delusions None reported or observed  Perception WDL  Hallucination None reported or observed  Judgment Impaired  Confusion None  Danger to Self  Current suicidal ideation? Denies  Agreement Not to Harm Self Yes  Description of Agreement verbal contract  Danger to Others  Danger to Others None reported or observed   D: Patient calm and cooperative. Pt reports disappointment  for not getting discharge today.  A: Medications administered as prescribed. Support and encouragement provided as needed.  R: Patient remains safe on the unit.

## 2022-09-08 ENCOUNTER — Other Ambulatory Visit: Payer: Self-pay

## 2022-09-08 DIAGNOSIS — F332 Major depressive disorder, recurrent severe without psychotic features: Secondary | ICD-10-CM | POA: Insufficient documentation

## 2022-09-08 DIAGNOSIS — B20 Human immunodeficiency virus [HIV] disease: Secondary | ICD-10-CM

## 2022-09-08 DIAGNOSIS — Z113 Encounter for screening for infections with a predominantly sexual mode of transmission: Secondary | ICD-10-CM

## 2022-09-08 MED ORDER — TRAZODONE HCL 50 MG PO TABS: 50.0000 mg | ORAL_TABLET | Freq: Every evening | ORAL | 0 refills | Status: DC | PRN

## 2022-09-08 MED ORDER — HYDROXYZINE HCL 25 MG PO TABS: 25.0000 mg | ORAL_TABLET | Freq: Three times a day (TID) | ORAL | 0 refills | Status: DC | PRN

## 2022-09-08 MED ORDER — NICOTINE 21 MG/24HR TD PT24: 21.0000 mg | MEDICATED_PATCH | Freq: Every day | TRANSDERMAL | 0 refills | Status: DC

## 2022-09-08 MED ORDER — BICTEGRAVIR-EMTRICITAB-TENOFOV 50-200-25 MG PO TABS
1.0000 | ORAL_TABLET | Freq: Every day | ORAL | 0 refills | Status: DC
Start: 2022-09-08 — End: 2022-09-24

## 2022-09-08 MED ORDER — OLANZAPINE 7.5 MG PO TABS: 7.5000 mg | ORAL_TABLET | Freq: Every day | ORAL | 0 refills | Status: DC

## 2022-09-08 MED ORDER — FLUOXETINE HCL 10 MG PO CAPS: 10.0000 mg | ORAL_CAPSULE | Freq: Every day | ORAL | 0 refills | Status: DC

## 2022-09-08 NOTE — Progress Notes (Signed)
Discharge Note:  Patient denies SI/HI/AVH at this time. Discharge instructions, AVS, prescriptions, and transition record gone over with patient. Patient agrees to comply with medication management, follow-up visit, and outpatient therapy. Patient belongings returned to patient. Patient questions and concerns addressed and answered. Patient ambulatory off unit. Patient discharged to home with wife.

## 2022-09-08 NOTE — Progress Notes (Signed)
  [facility] Adult Case Management Discharge Plan :  Will you be returning to the same living situation after discharge:  Yes,  home At discharge, do you have transportation home?: No. Do you have the ability to pay for your medications: Yes,  [employer]  Release of information consent forms completed and in the chart;  Patient's signature needed at discharge.  Patient to Follow up at:  Follow-up Information     [facility] Go on 09/28/2022.   Why: You have an appointment for medication management services on 09/28/22 at 10:00 am.  This appointment will be held in person. Contact information: [address] [place] [address] [phone]         [facility]. Schedule an appointment as soon as possible for a visit.   Why: Per provider request, please call to personally schedule an appointment for therapy services. Contact information: [address] [place], [place] [address] [phone]                 Next level of care provider has access to [facility] Link:no  Safety Planning and Suicide Prevention discussed: No. Several attempts made to contact family member     Has patient been referred to the Quitline?: Patient refused referral for treatment  Patient has been referred for addiction treatment: Patient refused referral for treatment.  Starleen Arms, LCSW 09/08/2022, 9:12 AM

## 2022-09-08 NOTE — Discharge Summary (Signed)
Physician Discharge Summary Note  Patient:  Ryan Cruz is an 57 y.o., male MRN:  [id number] DOB:  [birth year] Patient phone:  [phone] (home)  Patient address:   [address] [place] [address],  Total Time spent with patient: 20 minutes  Date of Admission:  09/04/2022 Date of Discharge: 09-08-2022  Reason for Admission:   This is the first psychiatric admission/evaluation in this [facility] for this 57 year old AA male with no none hx of mental illnesses or treatments. He does hx of substance use disorder that includes [facility] & methamphetamine). Admitted to the [facility] from the [facility] with complaints of worsening delusional thinking & paranoia (thinking that his neighbors were following him, filming him). He was also concerned that the same car was following him around for several months had verbalized that they would "shoot" him as well. Chart review also indicated patient has not slept well in a while, has been hearing voices & not eating well. A review of his lab results & toxicology reports has shown elevated TSH & UDS was positive for THC & methamphetamine.   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[facility]) Discharge Diagnoses: Principal Problem:   Stimulant-induced psychotic disorder (HCC) Active Problems:   TOBACCO USER   Cannabis use disorder   Stimulant use disorder   Major depressive disorder, recurrent episode, severe (HCC)   Past Psychiatric History:  Patient denies any hx of mental illnesses (diagnoses, treatments or hospitalizations)   Past Medical History:  Past Medical History:  Diagnosis Date   Anxiety    Asthma    Depression    HIV (human immunodeficiency virus infection) (HCC)    Hypertension    Hypoglycemia    sugars drop ion    Past Surgical History:  Procedure Laterality Date   BIOPSY  08/18/2019   Procedure: BIOPSY;  Surgeon: Lynann Bologna, MD;  Location: [facility] ENDOSCOPY;  Service: Endoscopy;;   COLONOSCOPY  02/12/2012   Procedure: COLONOSCOPY;   Surgeon: Romie Levee, MD;  Location: [facility] ENDOSCOPY;  Service: Endoscopy;  Laterality: N/A;   ESOPHAGOGASTRODUODENOSCOPY (EGD) WITH PROPOFOL N/A 08/18/2019   Procedure: ESOPHAGOGASTRODUODENOSCOPY (EGD) WITH PROPOFOL;  Surgeon: Lynann Bologna, MD;  Location: [facility] ENDOSCOPY;  Service: Endoscopy;  Laterality: N/A;   HERNIA REPAIR     umbilibcal   Family History:  Family History  Problem Relation Age of Onset   Diabetes Mother    Hypertension Mother    Heart disease Mother    Congestive Heart Failure Mother    Diabetes Father    Hypertension Father    Diabetes Sister    HIV Sister    Hypertension Sister    Diabetes Brother    Hypertension Brother    Diabetes Brother    Hypertension Brother    Diabetes Brother    Hypertension Brother    Diabetes Brother    Hypertension Brother    Family Psychiatric  History: See H&P  Social History:  Social History   Substance and Sexual Activity  Alcohol Use Yes   Alcohol/week: 2.0 standard drinks of alcohol   Types: 1 Glasses of wine, 1 Shots of liquor per week   Comment: rarely     Social History   Substance and Sexual Activity  Drug Use Yes   Frequency: 7.0 times per week   Types: Marijuana   Comment: marijuana daily    Social History   Socioeconomic History   Marital status: Married    Spouse name: Not on file   Number of children: Not on file  Years of education: Not on file   Highest education level: Not on file  Occupational History   Occupation: medication aide and hairstylist  Tobacco Use   Smoking status: Every Day    Current packs/day: 0.10    Average packs/day: 0.1 packs/day for 15.0 years (1.5 ttl pk-yrs)    Types: Cigarettes   Smokeless tobacco: Never   Tobacco comments:    States he uses cigarettes about twice a day, trying to quit  Substance and Sexual Activity   Alcohol use: Yes    Alcohol/week: 2.0 standard drinks of alcohol    Types: 1 Glasses of wine, 1 Shots of liquor per week    Comment: rarely   Drug  use: Yes    Frequency: 7.0 times per week    Types: Marijuana    Comment: marijuana daily   Sexual activity: Yes    Partners: Male    Comment: declined condoms  Other Topics Concern   Not on file  Social History Narrative   Not on file   Social Determinants of Health   Financial Resource Strain: Not on file  Food Insecurity: No Food Insecurity (09/04/2022)   Hunger Vital Sign    Worried About Running Out of Food in the Last Year: Never true    Ran Out of Food in the Last Year: Never true  Transportation Needs: No Transportation Needs (09/04/2022)   PRAPARE - Administrator, Civil Service (Medical): No    Lack of Transportation (Non-Medical): No  Physical Activity: Not on file  Stress: Not on file  Social Connections: Not on file    Hospital Course:   During the patient's hospitalization, patient had extensive initial psychiatric evaluation, and follow-up psychiatric evaluations every day.  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 Major depressive disorder  Patient's psychiatric medications were adjusted on admission:  -Start zyprexa 5 mg at bedtime -start prozac 10 mg every day   During the hospitalization, other adjustments were made to the patient's psychiatric medication regimen:  -zyprexa incr to 7.5 mg at bedtime   Patient's care was discussed during the interdisciplinary team meeting every day during the hospitalization.  The patient denied having side effects to prescribed psychiatric medication.  Gradually, patient started adjusting to milieu. The patient was evaluated each day by a clinical provider to ascertain response to treatment. Improvement was noted by the patient's report of decreasing symptoms, improved sleep and appetite, affect, medication tolerance, behavior, and participation in unit programming.  Patient was asked each day to complete a self inventory  noting mood, mental status, pain, new symptoms, anxiety and concerns.    Symptoms were reported as significantly decreased or resolved completely by discharge.   On day of discharge, the patient reports that their mood is stable. The patient denied having suicidal thoughts for more than 48 hours prior to discharge.  Patient denies having homicidal thoughts.  Patient denies having auditory hallucinations.  Patient denies any visual hallucinations or other symptoms of psychosis. The patient was motivated to continue taking medication with a goal of continued improvement in mental health.   The patient reports their target psychiatric symptoms of psychosis, depression, and suicidal thoughts, all responded well to the psychiatric medications, and the patient reports overall benefit other psychiatric hospitalization. Supportive psychotherapy was provided to the patient. The patient also participated in regular group therapy while hospitalized. Coping skills, problem solving as well as relaxation therapies were also part of the unit  programming.  Labs were reviewed with the patient, and abnormal results were discussed with the patient.  The patient is able to verbalize their individual safety plan to this provider.  # It is recommended to the patient to continue psychiatric medications as prescribed, after discharge from the hospital.    # It is recommended to the patient to follow up with your outpatient psychiatric provider and PCP.  # It was discussed with the patient, the impact of alcohol, drugs, tobacco have been there overall psychiatric and medical wellbeing, and total abstinence from substance use was recommended the patient.ed.  # Prescriptions provided or sent directly to preferred pharmacy at discharge. Patient agreeable to plan. Given opportunity to ask questions. Appears to feel comfortable with discharge.    # In the event of worsening symptoms, the patient is instructed to call the  crisis hotline, 911 and or go to the nearest ED for appropriate evaluation and treatment of symptoms. To follow-up with primary care provider for other medical issues, concerns and or health care needs  # Patient was discharged care of wife, with a plan to follow up as noted below.   Physical Findings: AIMS:  , ,  ,  ,    CIWA:  CIWA-Ar Total: 3 COWS:     Aims score zero on my exam. No eps on my exam.  Musculoskeletal: Strength & Muscle Tone: within normal limits Gait & Station: normal Patient leans: N/A   Psychiatric Specialty Exam:  Presentation  General Appearance:  Appropriate for Environment; Casual; Fairly Groomed  Eye Contact: Good  Speech: Normal Rate; Clear and Coherent  Speech Volume: Normal  Handedness: Right   Mood and Affect  Mood: Euthymic  Affect: Appropriate; Congruent; Full Range   Thought Process  Thought Processes: Linear  Descriptions of Associations:Intact  Orientation:Full (Time, Place and Person)  Thought Content:Logical  History of Schizophrenia/Schizoaffective disorder:No  Duration of Psychotic Symptoms:N/A (Report hearing voices x 1 year however has not had any voices x 2 days)  Hallucinations:Hallucinations: None Description of Auditory Hallucinations: Denies auditory hallucination x 2 days Description of Visual Hallucinations: Denies visual hallucinations x 2 days  Ideas of Reference:None  Suicidal Thoughts:Suicidal Thoughts: No  Homicidal Thoughts:Homicidal Thoughts: No   Sensorium  Memory: Immediate Good; Recent Good; Remote Good  Judgment: Good  Insight: Good   Executive Functions  Concentration: Good  Attention Span: Good  Recall: Good  Fund of Knowledge: Good  Language: Good   Psychomotor Activity  Psychomotor Activity: Psychomotor Activity: Normal   Assets  Assets: Communication Skills; Desire for Improvement; Physical Health; Resilience; Social Support   Sleep  Sleep: Sleep:  Fair Number of Hours of Sleep: 7    Physical Exam: Physical Exam Vitals reviewed.  Constitutional:      General: He is not in acute distress.    Appearance: He is normal weight. He is not toxic-appearing.  Pulmonary:     Effort: Pulmonary effort is normal. No respiratory distress.  Neurological:     Mental Status: He is alert.     Motor: No weakness.     Gait: Gait normal.  Psychiatric:        Mood and Affect: Mood normal.        Behavior: Behavior normal.        Thought Content: Thought content normal.        Judgment: Judgment normal.    Review of Systems  Constitutional:  Negative for chills and fever.  Cardiovascular:  Negative for chest pain and  palpitations.  Neurological:  Negative for dizziness, tingling, tremors and headaches.  Psychiatric/Behavioral:  Positive for substance abuse. Negative for depression, hallucinations, memory loss and suicidal ideas. The patient is not nervous/anxious and does not have insomnia.   All other systems reviewed and are negative.  Blood pressure (!) 159/93, pulse 70, temperature 97.9 F (36.6 C), temperature source Oral, resp. rate 18, height 5\' 7"  (1.702 m), weight 62.1 kg, SpO2 100%. Body mass index is 21.46 kg/m.   Social History   Tobacco Use  Smoking Status Every Day   Current packs/day: 0.10   Average packs/day: 0.1 packs/day for 15.0 years (1.5 ttl pk-yrs)   Types: Cigarettes  Smokeless Tobacco Never  Tobacco Comments   States he uses cigarettes about twice a day, trying to quit   Tobacco Cessation:  A prescription for an FDA-approved tobacco cessation medication provided at discharge   Blood Alcohol level:  No results found for: "ETH"  Metabolic Disorder Labs:  Lab Results  Component Value Date   HGBA1C 5.4 09/03/2022   MPG 108 09/03/2022   MPG 108 03/11/2020   No results found for: "PROLACTIN" Lab Results  Component Value Date   CHOL 193 09/03/2022   TRIG 176 (H) 09/03/2022   HDL 58 09/03/2022    CHOLHDL 3.3 09/03/2022   VLDL 35 09/03/2022   LDLCALC 100 (H) 09/03/2022   LDLCALC 130 (H) 05/23/2020    See Psychiatric Specialty Exam and Suicide Risk Assessment completed by Attending Physician prior to discharge.  Discharge destination:  Home  Is patient on multiple antipsychotic therapies at discharge:  No   Has Patient had three or more failed trials of antipsychotic monotherapy by history:  No  Recommended Plan for Multiple Antipsychotic Therapies: NA  Discharge Instructions     Diet - low sodium heart healthy   Complete by: As directed    Increase activity slowly   Complete by: As directed       Allergies as of 09/08/2022       Reactions   Shellfish Allergy Hives   Doxycycline Rash        Medication List     TAKE these medications      Indication  bictegravir-emtricitabine-tenofovir AF 50-200-25 MG Tabs tablet Commonly known as: BIKTARVY Take 1 tablet by mouth daily.  Indication: HIV Disease   FLUoxetine 10 MG capsule Commonly known as: PROZAC Take 1 capsule (10 mg total) by mouth daily. Start taking on: September 09, 2022  Indication: Major Depressive Disorder   hydrOXYzine 25 MG tablet Commonly known as: ATARAX Take 1 tablet (25 mg total) by mouth 3 (three) times daily as needed for anxiety.  Indication: Feeling Anxious   nicotine 21 mg/24hr patch Commonly known as: NICODERM CQ - dosed in mg/24 hours Place 1 patch (21 mg total) onto the skin daily. Start taking on: September 09, 2022  Indication: Nicotine Addiction   OLANZapine 7.5 MG tablet Commonly known as: ZYPREXA Take 1 tablet (7.5 mg total) by mouth at bedtime.  Indication: psychosis   traZODone 50 MG tablet Commonly known as: DESYREL Take 1 tablet (50 mg total) by mouth at bedtime as needed for sleep.  Indication: Trouble Sleeping        Follow-up Information     [facility] Go on 09/28/2022.   Why: You have an appointment for medication management services on 09/28/22 at 10:00  am.  This appointment will be held in person. Contact information: [address] [place] [address]  [phone]         Center, [facility]. Schedule an appointment as soon as possible for a visit.   Why: Per provider request, please call to personally schedule an appointment for therapy services. Contact information: [address] [place], [place] [address] [phone]                 Follow-up recommendations:    Activity: as tolerated  Diet: heart healthy  Other: -Follow-up with your outpatient psychiatric provider -instructions on appointment date, time, and address (location) are provided to you in discharge paperwork.  -Take your psychiatric medications as prescribed at discharge - instructions are provided to you in the discharge paperwork  -Follow-up with outpatient primary care doctor and other specialists -for management of preventative medicine and chronic medical disease, including high blood pressure  -Testing: Follow-up with outpatient provider for lab results:  TSH 4.954 T3, total: 86  T4, free: 1.02  -If you are prescribed an atypical antipsychotic medication, we recommend that your outpatient psychiatrist follow routine screening for side effects within 3 months of discharge, including monitoring: AIMS scale, height, weight, blood pressure, fasting lipid panel, HbA1c, and fasting blood sugar.   -Recommend total abstinence from alcohol, tobacco, and other illicit drug use at discharge.   -If your psychiatric symptoms recur, worsen, or if you have side effects to your psychiatric medications, call your outpatient psychiatric provider, 911, [phone] or go to the nearest emergency department.  -If suicidal thoughts occur, immediately call your outpatient psychiatric provider, 911, [phone] or go to the nearest emergency department.   Signed: Cristy Hilts, MD 09/08/2022, 9:27 AM    Total  Time Spent in Direct Patient Care:  I personally spent 35 minutes on the unit in direct patient care. The direct patient care time included face-to-face time with the patient, reviewing the patient's chart, communicating with other professionals, and coordinating care. Greater than 50% of this time was spent in counseling or coordinating care with the patient regarding goals of hospitalization, psycho-education, and discharge planning needs.   Phineas Inches, MD Psychiatrist

## 2022-09-08 NOTE — BHH Suicide Risk Assessment (Signed)
[facility] Discharge Suicide Risk Assessment   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[facility]) Discharge Diagnoses: Principal Problem:   Stimulant-induced psychotic disorder (HCC) Active Problems:   TOBACCO USER   Cannabis use disorder   Stimulant use disorder   Major depressive disorder, recurrent episode, severe (HCC)   Total Time spent with patient: 20 minutes   This is the first psychiatric admission/evaluation in this [facility] for this 57 year old AA male with no none hx of mental illnesses or treatments. He does hx of substance use disorder that includes [facility] & methamphetamine). Admitted to the [facility] from the [facility] with complaints of worsening delusional thinking & paranoia (thinking that his neighbors were following him, filming him). He was also concerned that the same car was following him around for several months had verbalized that they would "shoot" him as well. Chart review also indicated patient has not slept well in a while, has been hearing voices & not eating well. A review of his lab results & toxicology reports has shown elevated TSH & UDS was positive for THC & methamphetamine.    During the patient's hospitalization, patient had extensive initial psychiatric evaluation, and follow-up psychiatric evaluations every day.   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 Major depressive disorder   Patient's psychiatric medications were adjusted on admission:  -Start zyprexa 5 mg at bedtime -start prozac 10 mg every day    During the hospitalization, other adjustments were made to the patient's psychiatric medication regimen:  -zyprexa incr to 7.5 mg at bedtime    Patient's care was discussed during the interdisciplinary team meeting every day during the hospitalization.   The patient denied having side effects to prescribed psychiatric medication.   Gradually, patient started adjusting  to milieu. The patient was evaluated each day by a clinical provider to ascertain response to treatment. Improvement was noted by the patient's report of decreasing symptoms, improved sleep and appetite, affect, medication tolerance, behavior, and participation in unit programming.  Patient was asked each day to complete a self inventory noting mood, mental status, pain, new symptoms, anxiety and concerns.     Symptoms were reported as significantly decreased or resolved completely by discharge.    On day of discharge, the patient reports that their mood is stable. The patient denied having suicidal thoughts for more than 48 hours prior to discharge.  Patient denies having homicidal thoughts.  Patient denies having auditory hallucinations.  Patient denies any visual hallucinations or other symptoms of psychosis. The patient was motivated to continue taking medication with a goal of continued improvement in mental health.    The patient reports their target psychiatric symptoms of psychosis, depression, and suicidal thoughts, all responded well to the psychiatric medications, and the patient reports overall benefit other psychiatric hospitalization. Supportive psychotherapy was provided to the patient. The patient also participated in regular group therapy while hospitalized. Coping skills, problem solving as well as relaxation therapies were also part of the unit programming.   Labs were reviewed with the patient, and abnormal results were discussed with the patient.   The patient is able to verbalize their individual safety plan to this provider.   # It is recommended to the patient to continue psychiatric medications as prescribed, after discharge from the hospital.     # It is recommended to the patient to follow up with your outpatient psychiatric provider and PCP.   # It was discussed with the patient, the impact of alcohol,  drugs, tobacco have been there overall psychiatric and medical  wellbeing, and total abstinence from substance use was recommended the patient.ed.   # Prescriptions provided or sent directly to preferred pharmacy at discharge. Patient agreeable to plan. Given opportunity to ask questions. Appears to feel comfortable with discharge.    # In the event of worsening symptoms, the patient is instructed to call the crisis hotline, 911 and or go to the nearest ED for appropriate evaluation and treatment of symptoms. To follow-up with primary care provider for other medical issues, concerns and or health care needs   # Patient was discharged care of wife, with a plan to follow up as noted below.     Psychiatric Specialty Exam  Presentation  General Appearance:  Appropriate for Environment; Casual; Fairly Groomed  Eye Contact: Good  Speech: Normal Rate; Clear and Coherent  Speech Volume: Normal  Handedness: Right   Mood and Affect  Mood: Euthymic  Duration of Depression Symptoms: Greater than two weeks  Affect: Appropriate; Congruent; Full Range   Thought Process  Thought Processes: Linear  Descriptions of Associations:Intact  Orientation:Full (Time, Place and Person)  Thought Content:Logical  History of Schizophrenia/Schizoaffective disorder:No  Duration of Psychotic Symptoms:N/A (Report hearing voices x 1 year however has not had any voices x 2 days)  Hallucinations:Hallucinations: None Description of Auditory Hallucinations: Denies auditory hallucination x 2 days Description of Visual Hallucinations: Denies visual hallucinations x 2 days  Ideas of Reference:None  Suicidal Thoughts:Suicidal Thoughts: No  Homicidal Thoughts:Homicidal Thoughts: No   Sensorium  Memory: Immediate Good; Recent Good; Remote Good  Judgment: Good  Insight: Good   Executive Functions  Concentration: Good  Attention Span: Good  Recall: Good  Fund of Knowledge: Good  Language: Good   Psychomotor Activity  Psychomotor  Activity: Psychomotor Activity: Normal   Assets  Assets: Communication Skills; Desire for Improvement; Physical Health; Resilience; Social Support   Sleep  Sleep: Sleep: Fair Number of Hours of Sleep: 7   Physical Exam: Physical Exam ROS Blood pressure (!) 159/93, pulse 70, temperature 97.9 F (36.6 C), temperature source Oral, resp. rate 18, height 5\' 7"  (1.702 m), weight 62.1 kg, SpO2 100%. Body mass index is 21.46 kg/m.  Mental Status Per Nursing Assessment::   On Admission:  NA  Demographic factors:  Male, Cardell Peach, lesbian, or bisexual orientation   Loss Factors:  NA   Historical Factors:  NA   Risk Reduction Factors:  Living with another person, especially a relative  Continued Clinical Symptoms:  Psychosis has resolved. Mood is stable. Sleep is better. Denying SI.   Cognitive Features That Contribute To Risk:  None    Suicide Risk:  Mild:  There are no identifiable suicide plans, no associated intent, mild dysphoria and related symptoms, good self-control (both objective and subjective assessment), few other risk factors, and identifiable protective factors, including available and accessible social support.    Follow-up Information     [facility] Go on 09/28/2022.   Why: You have an appointment for medication management services on 09/28/22 at 10:00 am.  This appointment will be held in person. Contact information: [address] [place] [address] [phone]         [facility]. Schedule an appointment as soon as possible for a visit.   Why: Per provider request, please call to personally schedule an appointment for therapy services. Contact information: [address]yn Ske[address] [place], [place] [address] [phone]  Plan Of Care/Follow-up recommendations:   -Follow-up with your outpatient psychiatric provider -instructions on appointment date, time, and  address (location) are provided to you in discharge paperwork.   -Take your psychiatric medications as prescribed at discharge - instructions are provided to you in the discharge paperwork   -Follow-up with outpatient primary care doctor and other specialists -for management of preventative medicine and chronic medical disease, including high blood pressure   -Testing: Follow-up with outpatient provider for lab results:  TSH 4.954 T3, total: 86  T4, free: 1.02   -If you are prescribed an atypical antipsychotic medication, we recommend that your outpatient psychiatrist follow routine screening for side effects within 3 months of discharge, including monitoring: AIMS scale, height, weight, blood pressure, fasting lipid panel, HbA1c, and fasting blood sugar.    -Recommend total abstinence from alcohol, tobacco, and other illicit drug use at discharge.    -If your psychiatric symptoms recur, worsen, or if you have side effects to your psychiatric medications, call your outpatient psychiatric provider, 911, [phone] or go to the nearest emergency department.   -If suicidal thoughts occur, immediately call your outpatient psychiatric provider, 911, [phone] or go to the nearest emergency department.  Cristy Hilts, MD 09/08/2022, 9:36 AM

## 2022-09-08 NOTE — Plan of Care (Signed)
  Problem: Activity: Goal: Interest or engagement in activities will improve Outcome: Progressing   Problem: Activity: Goal: Sleeping patterns will improve Outcome: Progressing   

## 2022-09-08 NOTE — Progress Notes (Signed)
   09/08/22 0800  Psych Admission Type (Psych Patients Only)  Admission Status Voluntary/72 hour document signed  Psychosocial Assessment  Patient Complaints None  Eye Contact Fair  Facial Expression Animated  Affect Appropriate to circumstance  Speech Logical/coherent  Interaction Assertive  Motor Activity Slow  Appearance/Hygiene Unremarkable  Behavior Characteristics Cooperative;Appropriate to situation  Mood Pleasant  Thought Process  Coherency WDL  Content WDL  Delusions None reported or observed  Perception WDL  Hallucination None reported or observed  Judgment WDL  Confusion None  Danger to Self  Current suicidal ideation? Denies  Agreement Not to Harm Self Yes  Description of Agreement verbal  Danger to Others  Danger to Others None reported or observed

## 2022-09-08 NOTE — Discharge Instructions (Signed)

## 2022-09-10 ENCOUNTER — Other Ambulatory Visit: Payer: Medicaid Other

## 2022-09-24 ENCOUNTER — Encounter: Payer: Self-pay | Admitting: Family

## 2022-09-24 ENCOUNTER — Other Ambulatory Visit: Payer: Self-pay

## 2022-09-24 ENCOUNTER — Ambulatory Visit (INDEPENDENT_AMBULATORY_CARE_PROVIDER_SITE_OTHER): Payer: 59 | Admitting: Family

## 2022-09-24 VITALS — BP 176/93 | HR 69 | Temp 98.3°F | Ht 67.0 in | Wt 146.0 lb

## 2022-09-24 DIAGNOSIS — F17210 Nicotine dependence, cigarettes, uncomplicated: Secondary | ICD-10-CM | POA: Diagnosis not present

## 2022-09-24 DIAGNOSIS — F22 Delusional disorders: Secondary | ICD-10-CM | POA: Diagnosis not present

## 2022-09-24 DIAGNOSIS — F17200 Nicotine dependence, unspecified, uncomplicated: Secondary | ICD-10-CM

## 2022-09-24 DIAGNOSIS — I10 Essential (primary) hypertension: Secondary | ICD-10-CM | POA: Diagnosis not present

## 2022-09-24 DIAGNOSIS — Z21 Asymptomatic human immunodeficiency virus [HIV] infection status: Secondary | ICD-10-CM

## 2022-09-24 DIAGNOSIS — F1510 Other stimulant abuse, uncomplicated: Secondary | ICD-10-CM

## 2022-09-24 DIAGNOSIS — Z0000 Encounter for general adult medical examination without abnormal findings: Secondary | ICD-10-CM

## 2022-09-24 DIAGNOSIS — B20 Human immunodeficiency virus [HIV] disease: Secondary | ICD-10-CM | POA: Diagnosis not present

## 2022-09-24 MED ORDER — AMLODIPINE BESYLATE 10 MG PO TABS: 10.0000 mg | ORAL_TABLET | Freq: Every day | ORAL | 3 refills | Status: DC

## 2022-09-24 MED ORDER — BICTEGRAVIR-EMTRICITAB-TENOFOV 50-200-25 MG PO TABS
1.0000 | ORAL_TABLET | Freq: Every day | ORAL | 5 refills | Status: DC
Start: 2022-09-24 — End: 2022-12-15

## 2022-09-24 NOTE — Patient Instructions (Addendum)
Nice to see you.  We will check your lab work today.  Continue to take your medication daily as prescribed.  Refills have been sent to the pharmacy.  Plan for follow up in 3 months or sooner if needed with lab work on the same day.  Have a great day and stay safe!     [facility] Go on 09/28/2022.   Why: You have an appointment for medication management services on 09/28/22 at 10:00 am.  This appointment will be held in person. Contact information: [address] [place] [address] [phone]

## 2022-09-24 NOTE — Assessment & Plan Note (Signed)
Improved since recent hospitalization with addition of fluoxetine, trazodone, and olanzapine along with methamphetamine cessation. Has follow up with psychiatry scheduled for 8/19. Has

## 2022-09-24 NOTE — Assessment & Plan Note (Signed)
Discussed importance of safe sexual practice and condom use. Condoms and STD testing offered.  Reviewed vaccines and currently declined.  Routine dental care is up to date per recommendations.

## 2022-09-24 NOTE — Assessment & Plan Note (Signed)
Has been sober since discharge from hospital and has no current intentions to restart using methamphetamine.

## 2022-09-24 NOTE — Progress Notes (Signed)
Brief Narrative   Patient ID: Ryan Cruz, male    DOB: 11/27/1965, 57 y.o.   MRN: [id number]  Mr. Vanskike is a 57 y/o AA male diagnosed with HIV-1 disease in March 2007 with risk factor of MSM. Initial CD4 count and viral load are unavailable. Genosures with no significant medication resistant mutations. No history of opportunistic infection. [id number] negative. Previous ART experience with Atripla, Charlett Lango, and currently Biktarvy.    Subjective:    Chief Complaint  Patient presents with   Follow-up    HPI:  Ryan Cruz is a 57 y.o. male with HIV disease last seen on 06/29/2022 with well-controlled virus and good adherence and tolerance to [employer].  Viral load was undetectable with CD4 count of 651.  Kidney function, liver function, electrolytes within normal ranges.  In the interim has been hospitalized for mental health related concerns and recently discharged.  Here today for follow-up.  Ryan Cruz has been doing well since leaving the hospital with resolution of his symptoms and has not used methamphetamines and continues to take fluoxetine and hydroxyzine. Was unaware supposed to be taking olanzapine. Continues to take his Biktarvy with no adverse side effects. Working on getting life back established after hospitalization and realized he developed a problem. Condoms and STD testing offered. Vaccinations reviewed and declined.   Denies fevers, chills, night sweats, headaches, changes in vision, neck pain/stiffness, nausea, diarrhea, vomiting, lesions or rashes.    Allergies  Allergen Reactions   Shellfish Allergy Hives   Doxycycline Rash      Outpatient Medications Prior to Visit  Medication Sig Dispense Refill   FLUoxetine (PROZAC) 10 MG capsule Take 1 capsule (10 mg total) by mouth daily. 30 capsule 0   hydrOXYzine (ATARAX) 25 MG tablet Take 1 tablet (25 mg total) by mouth 3 (three) times daily as needed for anxiety. 30 tablet 0   traZODone (DESYREL) 50 MG tablet Take 1  tablet (50 mg total) by mouth at bedtime as needed for sleep. 30 tablet 0   bictegravir-emtricitabine-tenofovir AF (BIKTARVY) 50-200-25 MG TABS tablet Take 1 tablet by mouth daily. 30 tablet 0   nicotine (NICODERM CQ - DOSED IN MG/24 HOURS) 21 mg/24hr patch Place 1 patch (21 mg total) onto the skin daily. (Patient not taking: Reported on 09/24/2022) 28 patch 0   OLANZapine (ZYPREXA) 7.5 MG tablet Take 1 tablet (7.5 mg total) by mouth at bedtime. (Patient not taking: Reported on 09/24/2022) 30 tablet 0   No facility-administered medications prior to visit.     Past Medical History:  Diagnosis Date   Anxiety    Asthma    Depression    HIV (human immunodeficiency virus infection) (HCC)    Hypertension    Hypoglycemia    sugars drop ion     Past Surgical History:  Procedure Laterality Date   BIOPSY  08/18/2019   Procedure: BIOPSY;  Surgeon: Ryan Bologna, MD;  Location: [facility] ENDOSCOPY;  Service: Endoscopy;;   COLONOSCOPY  02/12/2012   Procedure: COLONOSCOPY;  Surgeon: Ryan Levee, MD;  Location: [facility] ENDOSCOPY;  Service: Endoscopy;  Laterality: N/A;   ESOPHAGOGASTRODUODENOSCOPY (EGD) WITH PROPOFOL N/A 08/18/2019   Procedure: ESOPHAGOGASTRODUODENOSCOPY (EGD) WITH PROPOFOL;  Surgeon: Ryan Bologna, MD;  Location: [facility] ENDOSCOPY;  Service: Endoscopy;  Laterality: N/A;   HERNIA REPAIR     umbilibcal      Review of Systems  Constitutional:  Negative for appetite change, chills, fatigue, fever and unexpected weight change.  Eyes:  Negative for visual disturbance.  Respiratory:  Negative for cough, chest tightness, shortness of breath and wheezing.   Cardiovascular:  Negative for chest pain and leg swelling.  Gastrointestinal:  Negative for abdominal pain, constipation, diarrhea, nausea and vomiting.  Genitourinary:  Negative for dysuria, flank pain, frequency, genital sores, hematuria and urgency.  Skin:  Negative for rash.  Allergic/Immunologic: Negative for immunocompromised state.   Neurological:  Negative for dizziness and headaches.      Objective:    BP (!) 176/93   Pulse 69   Temp 98.3 F (36.8 C) (Temporal)   Ht 5\' 7"  (1.702 m)   Wt 146 lb (66.2 kg)   SpO2 96%   BMI 22.87 kg/m  Nursing note and vital signs reviewed.  Physical Exam Constitutional:      General: He is not in acute distress.    Appearance: He is well-developed.  Eyes:     Conjunctiva/sclera: Conjunctivae normal.  Cardiovascular:     Rate and Rhythm: Normal rate and regular rhythm.     Heart sounds: Normal heart sounds. No murmur heard.    No friction rub. No gallop.  Pulmonary:     Effort: Pulmonary effort is normal. No respiratory distress.     Breath sounds: Normal breath sounds. No wheezing or rales.  Chest:     Chest wall: No tenderness.  Abdominal:     General: Bowel sounds are normal.     Palpations: Abdomen is soft.     Tenderness: There is no abdominal tenderness.  Musculoskeletal:     Cervical back: Neck supple.  Lymphadenopathy:     Cervical: No cervical adenopathy.  Skin:    General: Skin is warm and dry.     Findings: No rash.  Neurological:     Mental Status: He is alert and oriented to person, place, and time.  Psychiatric:        Behavior: Behavior normal.        Thought Content: Thought content normal.        Judgment: Judgment normal.         06/29/2022   10:35 AM 09/15/2021    9:57 AM 04/09/2021    2:38 PM 07/31/2020   11:17 AM 06/07/2020    8:58 AM  Depression screen PHQ 2/9  Decreased Interest 0 0 0 1 0  Down, Depressed, Hopeless 0 0 0 1 0  PHQ - 2 Score 0 0 0 2 0  Altered sleeping    3   Tired, decreased energy    2   Change in appetite    1   Feeling bad or failure about yourself     2   Trouble concentrating    2   Moving slowly or fidgety/restless    2   Suicidal thoughts    1   PHQ-9 Score    15        Assessment & Plan:    Patient Active Problem List   Diagnosis Date Noted   Major depressive disorder, recurrent episode, severe  (HCC) 09/08/2022   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   Stimulant use disorder 09/05/2022   Delusional disorder, persecutory type, multiple episodes, currently in acute episode (HCC) 09/04/2022   Cannabis use disorder 09/04/2022   Episodic methamphetamine abuse (HCC) 09/04/2022   Screening for STDs (sexually transmitted diseases) 09/15/2021   Therapeutic drug monitoring 09/15/2021   Giardia 04/22/2020   Protein-calorie malnutrition, severe 04/22/2020   Diarrhea 04/21/2020   Acute diffuse otitis externa of left ear 03/15/2020  Numbness of feet 03/11/2020   Intractable vomiting with nausea    Nausea vomiting and diarrhea    AKI (acute kidney injury) (HCC) 08/14/2019   Foot lesion 06/05/2019   Healthcare maintenance 04/26/2018   Hepatitis B immune 11/18/2016   Medically noncompliant    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    Other headache syndrome    Optic neuritis 04/16/2016   Chest pain 11/20/2015   Hypoglycemia 01/29/2014   Allergic sinusitis 06/29/2012   Pilonidal cyst 01/04/2012   Depression 06/25/2010   CONDYLOMA ACUMINATUM 01/07/2010   Essential hypertension 12/12/2009   Ventral hernia 03/28/2009   NEUROPATHY, NOS 04/30/2008   DENTAL CARIES 12/14/2007   TOBACCO USER 06/01/2007   VERTIGO 05/13/2007   HIV infection (HCC) 02/24/2006   HYPERLIPIDEMIA 02/24/2006   Rectal bleeding 11/19/2005   ASTHMA 12/11/1994     Problem List Items Addressed This Visit       Cardiovascular and Mediastinum   Essential hypertension    Blood pressure poorly controlled at currently Stage 2 hypertension and fortunately without any additional symptoms. Restart amlodipine. Encouraged to monitor blood pressure at home as able and continue with lifestyle management. Advised to seek further care if worst headache of life develops.       Relevant Medications   amLODipine (NORVASC) 10 MG tablet     Other   HIV infection (HCC)     Travon continues to have well controlled virus with good adherence and tolerance to [employer]. Reviewed previous lab work and discussed plan of care. Fortunately he is no longer taking methamphetamine as this can certainly present a challenge to long term suppression. Check lab work. Continue current dose of Biktarvy. Plan for follow up in 3 months or sooner if needed with lab work on the same day.       Relevant Medications   bictegravir-emtricitabine-tenofovir AF (BIKTARVY) 50-200-25 MG TABS tablet   Other Relevant Orders   COMPLETE METABOLIC PANEL WITH GFR   HIV-1 RNA quant-no reflex-bld   T-helper cell (CD4)- ([facility] clinic only)   TOBACCO USER   Healthcare maintenance - Primary    Discussed importance of safe sexual practice and condom use. Condoms and STD testing offered.  Reviewed vaccines and currently declined.  Routine dental care is up to date per recommendations.       Delusional disorder, persecutory type, multiple episodes, currently in acute episode (HCC)    Improved since recent hospitalization with addition of fluoxetine, trazodone, and olanzapine along with methamphetamine cessation. Has follow up with psychiatry scheduled for 8/19. Has      Episodic methamphetamine abuse (HCC)    Has been sober since discharge from hospital and has no current intentions to restart using methamphetamine.         I am having Aseel E. Bugge start on amLODipine. I am also having him maintain his FLUoxetine, hydrOXYzine, nicotine, OLANZapine, traZODone, and bictegravir-emtricitabine-tenofovir AF.   Meds ordered this encounter  Medications   bictegravir-emtricitabine-tenofovir AF (BIKTARVY) 50-200-25 MG TABS tablet    Sig: Take 1 tablet by mouth daily.    Dispense:  30 tablet    Refill:  5    Order Specific Question:   Supervising Provider    Answer:   Drue Second, CYNTHIA [[id number]]   amLODipine (NORVASC) 10 MG tablet    Sig: Take 1 tablet (10 mg total) by mouth daily.    Dispense:  30 tablet     Refill:  3    Order  Specific Question:   Supervising Provider    Answer:   Judyann Munson [[id number]]     Follow-up: Return in about 3 months (around 12/25/2022).   Marcos Eke, MSN, FNP-C Nurse Practitioner [facility] for Infectious Disease [facility] Main number: [phone]

## 2022-09-24 NOTE — Assessment & Plan Note (Signed)
Blood pressure poorly controlled at currently Stage 2 hypertension and fortunately without any additional symptoms. Restart amlodipine. Encouraged to monitor blood pressure at home as able and continue with lifestyle management. Advised to seek further care if worst headache of life develops.

## 2022-09-24 NOTE — Assessment & Plan Note (Signed)
Ryan Cruz continues to have well controlled virus with good adherence and tolerance to [employer]. Reviewed previous lab work and discussed plan of care. Fortunately he is no longer taking methamphetamine as this can certainly present a challenge to long term suppression. Check lab work. Continue current dose of Biktarvy. Plan for follow up in 3 months or sooner if needed with lab work on the same day.

## 2022-09-25 LAB — T-HELPER CELL (CD4) - (RCID CLINIC ONLY)
CD4 % Helper T Cell: 31 % — ABNORMAL LOW (ref 33–65)
CD4 T Cell Abs: 487 /uL (ref 400–1790)

## 2022-09-28 LAB — HIV-1 RNA QUANT-NO REFLEX-BLD
HIV 1 RNA Quant: <20 {copies}/mL — ABNORMAL HIGH
HIV-1 RNA Quant, Log: <1.3 {Log_copies}/mL — ABNORMAL HIGH

## 2022-09-28 LAB — COMPLETE METABOLIC PANEL WITH GFR
AG Ratio: 1.6 (calc) (ref 1.0–2.5)
ALT: 19 U/L (ref 9–46)
AST: 13 U/L (ref 10–35)
Albumin: 4.6 g/dL (ref 3.6–5.1)
Alkaline phosphatase (APISO): 70 U/L (ref 35–144)
BUN: 10 mg/dL (ref 7–25)
CO2: 25 mmol/L (ref 20–32)
Calcium: 9.1 mg/dL (ref 8.6–10.3)
Chloride: 107 mmol/L (ref 98–110)
Creat: 1.12 mg/dL (ref 0.70–1.30)
Globulin: 2.8 g/dL (ref 1.9–3.7)
Glucose, Bld: 100 mg/dL — ABNORMAL HIGH (ref 65–99)
Potassium: 4.2 mmol/L (ref 3.5–5.3)
Sodium: 139 mmol/L (ref 135–146)
Total Bilirubin: 0.6 mg/dL (ref 0.2–1.2)
Total Protein: 7.4 g/dL (ref 6.1–8.1)
eGFR: 78 mL/min/{1.73_m2} (ref >=60)

## 2022-10-23 NOTE — Progress Notes (Signed)
The 10-year ASCVD risk score (Arnett DK, et al., 2019) is: 29.5%   Values used to calculate the score:     Age: 57 years     Sex: Male     Is Non-Hispanic African American: Yes     Diabetic: No     Tobacco smoker: Yes     Systolic Blood Pressure: 176 mmHg     Is BP treated: Yes     HDL Cholesterol: 58 mg/dL     Total Cholesterol: 193 mg/dL  Sandie Ano, RN

## 2022-10-24 ENCOUNTER — Ambulatory Visit (HOSPITAL_COMMUNITY): Payer: 59 | Admitting: Psychiatry

## 2022-11-16 ENCOUNTER — Ambulatory Visit (HOSPITAL_COMMUNITY): Payer: 59 | Admitting: Family

## 2022-11-16 ENCOUNTER — Encounter (HOSPITAL_COMMUNITY): Payer: Self-pay

## 2022-11-18 ENCOUNTER — Ambulatory Visit (HOSPITAL_COMMUNITY): Payer: 59 | Admitting: Clinical

## 2022-12-15 ENCOUNTER — Encounter: Payer: Self-pay | Admitting: Family

## 2022-12-15 ENCOUNTER — Ambulatory Visit (INDEPENDENT_AMBULATORY_CARE_PROVIDER_SITE_OTHER): Payer: 59 | Admitting: Family

## 2022-12-15 ENCOUNTER — Other Ambulatory Visit: Payer: Self-pay

## 2022-12-15 ENCOUNTER — Other Ambulatory Visit (HOSPITAL_COMMUNITY)
Admission: RE | Admit: 2022-12-15 | Discharge: 2022-12-15 | Disposition: A | Discharge status: Home / Self Care | Payer: 59 | Source: Ambulatory Visit | Attending: Family | Admitting: Family

## 2022-12-15 VITALS — BP 128/77 | HR 69 | Temp 98.2°F | Wt 163.0 lb

## 2022-12-15 DIAGNOSIS — Z21 Asymptomatic human immunodeficiency virus [HIV] infection status: Secondary | ICD-10-CM | POA: Diagnosis not present

## 2022-12-15 DIAGNOSIS — F332 Major depressive disorder, recurrent severe without psychotic features: Secondary | ICD-10-CM

## 2022-12-15 DIAGNOSIS — F17200 Nicotine dependence, unspecified, uncomplicated: Secondary | ICD-10-CM

## 2022-12-15 DIAGNOSIS — Z1212 Encounter for screening for malignant neoplasm of rectum: Secondary | ICD-10-CM

## 2022-12-15 DIAGNOSIS — Z23 Encounter for immunization: Secondary | ICD-10-CM

## 2022-12-15 DIAGNOSIS — Z0000 Encounter for general adult medical examination without abnormal findings: Secondary | ICD-10-CM | POA: Diagnosis not present

## 2022-12-15 DIAGNOSIS — I10 Essential (primary) hypertension: Secondary | ICD-10-CM

## 2022-12-15 DIAGNOSIS — Z113 Encounter for screening for infections with a predominantly sexual mode of transmission: Secondary | ICD-10-CM | POA: Diagnosis not present

## 2022-12-15 MED ORDER — AMLODIPINE BESYLATE 10 MG PO TABS: 10.0000 mg | ORAL_TABLET | Freq: Every day | ORAL | 5 refills | Status: DC

## 2022-12-15 MED ORDER — BICTEGRAVIR-EMTRICITAB-TENOFOV 50-200-25 MG PO TABS
1.0000 | ORAL_TABLET | Freq: Every day | ORAL | 5 refills | Status: DC
Start: 2022-12-15 — End: 2023-04-26

## 2022-12-15 NOTE — Assessment & Plan Note (Signed)
Discussed importance of safe sexual practice and condom use. Condoms and STD testing offered.  Vaccinations reviewed -COVID and influenza updated. Due for colonoscopy and will contact gastroenterology/general surgery Due for routine dental care which he will schedule independently. Anal Pap smear obtained

## 2022-12-15 NOTE — Patient Instructions (Addendum)
Nice to see you.  We will check your lab work today.  Continue to take your medication daily as prescribed.  Call Dr. Maisie Fus to schedule your colonoscopy.  Refills have been sent to the pharmacy.   Plan for follow up in 3 months or sooner if needed with lab work on the same day.  Have a great day and stay safe!

## 2022-12-15 NOTE — Assessment & Plan Note (Signed)
Mood is stable and currently followed by psychiatry.  No adverse side effects.  Suspect this is a contributing factor to his recent weight gain.  Continue current dose of trazodone, olanzapine, fluoxetine, and hydroxyzine per psychiatry.

## 2022-12-15 NOTE — Progress Notes (Signed)
Brief Narrative   Patient ID: Ryan Cruz, male    DOB: [birth year], 57 y.o.   MRN: [id number]  Ryan Cruz is a 57 y/o AA male diagnosed with HIV-1 disease in March 2007 with risk factor of MSM. Initial CD4 count and viral load are unavailable. Genosures with no significant medication resistant mutations. No history of opportunistic infection. [id number] negative. Previous ART experience with Atripla, Charlett Lango, and currently Biktarvy.    Subjective:    Chief Complaint  Patient presents with   Follow-up    Covid+ Flu    HPI:  Ryan Cruz is a 57 y.o. male with HIV disease and hypertension last seen on 09/24/2022 with well-controlled virus and good adherence and tolerance to [employer].  Viral load was undetectable with CD4 count 487.  Kidney function, liver function, electrolytes within normal ranges.  Here today for routine follow-up.  Ryan Cruz has been doing well since his last office visit and has noticed increased weight.  Continues to take medications as prescribed with no adverse side effects or problems obtaining from the pharmacy.  Condoms and STD testing offered.  Continues to use marijuana and tobacco daily with alcohol rarely.  Healthcare maintenance reviewed.     Denies fevers, chills, night sweats, headaches, changes in vision, neck pain/stiffness, nausea, diarrhea, vomiting, lesions or rashes.  Lab Results  Component Value Date   CD4TCELL 31 (L) 09/24/2022   CD4TABS 487 09/24/2022   Lab Results  Component Value Date   HIV1RNAQUANT <20 (H) 09/24/2022     Allergies  Allergen Reactions   Shellfish Allergy Hives   Doxycycline Rash      Outpatient Medications Prior to Visit  Medication Sig Dispense Refill   hydrOXYzine (ATARAX) 25 MG tablet Take 1 tablet (25 mg total) by mouth 3 (three) times daily as needed for anxiety. 30 tablet 0   OLANZapine (ZYPREXA) 10 MG tablet Take 10 mg by mouth at bedtime.     amLODipine (NORVASC) 10 MG tablet Take 1 tablet (10 mg  total) by mouth daily. 30 tablet 3   bictegravir-emtricitabine-tenofovir AF (BIKTARVY) 50-200-25 MG TABS tablet Take 1 tablet by mouth daily. 30 tablet 5   FLUoxetine (PROZAC) 10 MG capsule Take 1 capsule (10 mg total) by mouth daily. 30 capsule 0   traZODone (DESYREL) 50 MG tablet Take 1 tablet (50 mg total) by mouth at bedtime as needed for sleep. 30 tablet 0   nicotine (NICODERM CQ - DOSED IN MG/24 HOURS) 21 mg/24hr patch Place 1 patch (21 mg total) onto the skin daily. (Patient not taking: Reported on 09/24/2022) 28 patch 0   OLANZapine (ZYPREXA) 7.5 MG tablet Take 1 tablet (7.5 mg total) by mouth at bedtime. (Patient not taking: Reported on 09/24/2022) 30 tablet 0   No facility-administered medications prior to visit.     Past Medical History:  Diagnosis Date   Anxiety    Asthma    Depression    HIV (human immunodeficiency virus infection) (HCC)    Hypertension    Hypoglycemia    sugars drop ion     Past Surgical History:  Procedure Laterality Date   BIOPSY  08/18/2019   Procedure: BIOPSY;  Surgeon: Lynann Bologna, MD;  Location: [facility] ENDOSCOPY;  Service: Endoscopy;;   COLONOSCOPY  02/12/2012   Procedure: COLONOSCOPY;  Surgeon: Romie Levee, MD;  Location: [facility] ENDOSCOPY;  Service: Endoscopy;  Laterality: N/A;   ESOPHAGOGASTRODUODENOSCOPY (EGD) WITH PROPOFOL N/A 08/18/2019   Procedure: ESOPHAGOGASTRODUODENOSCOPY (EGD) WITH PROPOFOL;  Surgeon: Chales Abrahams,  Filbert Berthold, MD;  Location: [facility] ENDOSCOPY;  Service: Endoscopy;  Laterality: N/A;   HERNIA REPAIR     umbilibcal      Review of Systems  Constitutional:  Negative for appetite change, chills, fatigue, fever and unexpected weight change.  Eyes:  Negative for visual disturbance.  Respiratory:  Negative for cough, chest tightness, shortness of breath and wheezing.   Cardiovascular:  Negative for chest pain and leg swelling.  Gastrointestinal:  Negative for abdominal pain, constipation, diarrhea, nausea and vomiting.  Genitourinary:  Negative  for dysuria, flank pain, frequency, genital sores, hematuria and urgency.  Skin:  Negative for rash.  Allergic/Immunologic: Negative for immunocompromised state.  Neurological:  Negative for dizziness and headaches.      Objective:    BP 128/77   Pulse 69   Temp 98.2 F (36.8 C) (Oral)   Wt 163 lb (73.9 kg)   SpO2 97%   BMI 25.53 kg/m  Nursing note and vital signs reviewed.  Physical Exam Constitutional:      General: He is not in acute distress.    Appearance: He is well-developed.  Eyes:     Conjunctiva/sclera: Conjunctivae normal.  Cardiovascular:     Rate and Rhythm: Normal rate and regular rhythm.     Heart sounds: Normal heart sounds. No murmur heard.    No friction rub. No gallop.  Pulmonary:     Effort: Pulmonary effort is normal. No respiratory distress.     Breath sounds: Normal breath sounds. No wheezing or rales.  Chest:     Chest wall: No tenderness.  Abdominal:     General: Bowel sounds are normal.     Palpations: Abdomen is soft.     Tenderness: There is no abdominal tenderness.  Musculoskeletal:     Cervical back: Neck supple.  Lymphadenopathy:     Cervical: No cervical adenopathy.  Skin:    General: Skin is warm and dry.     Findings: No rash.  Neurological:     Mental Status: He is alert and oriented to person, place, and time.  Psychiatric:        Behavior: Behavior normal.        Thought Content: Thought content normal.        Judgment: Judgment normal.         12/15/2022    9:48 AM 06/29/2022   10:35 AM 09/15/2021    9:57 AM 04/09/2021    2:38 PM 07/31/2020   11:17 AM  Depression screen PHQ 2/9  Decreased Interest 0 0 0 0 1  Down, Depressed, Hopeless 1 0 0 0 1  PHQ - 2 Score 1 0 0 0 2  Altered sleeping     3  Tired, decreased energy     2  Change in appetite     1  Feeling bad or failure about yourself      2  Trouble concentrating     2  Moving slowly or fidgety/restless     2  Suicidal thoughts     1  PHQ-9 Score     15        Assessment & Plan:    Patient Active Problem List   Diagnosis Date Noted   Major depressive disorder, recurrent episode, severe (HCC) 09/08/2022   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   Stimulant use disorder 09/05/2022   Delusional disorder, persecutory type, multiple episodes, currently in acute episode (HCC) 09/04/2022   Cannabis use disorder 09/04/2022   Episodic methamphetamine abuse (  HCC) 09/04/2022   Screening for STDs (sexually transmitted diseases) 09/15/2021   Therapeutic drug monitoring 09/15/2021   Giardia 04/22/2020   Protein-calorie malnutrition, severe 04/22/2020   Diarrhea 04/21/2020   Acute diffuse otitis externa of left ear 03/15/2020   Numbness of feet 03/11/2020   Intractable vomiting with nausea    Nausea vomiting and diarrhea    AKI (acute kidney injury) (HCC) 08/14/2019   Foot lesion 06/05/2019   Healthcare maintenance 04/26/2018   Hepatitis B immune 11/18/2016   Medically noncompliant    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    Other headache syndrome    Optic neuritis 04/16/2016   Chest pain 11/20/2015   Hypoglycemia 01/29/2014   Allergic sinusitis 06/29/2012   Pilonidal cyst 01/04/2012   Depression 06/25/2010   CONDYLOMA ACUMINATUM 01/07/2010   Essential hypertension 12/12/2009   Ventral hernia 03/28/2009   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  Does not currently check blood pressure at home.  No neurologic/ophthalmologic signs/symptoms.  Continue  current dose of amlodipine.      Relevant Medications   amLODipine (NORVASC) 10 MG tablet     Other   HIV infection [facility])    Ryan Cruz continues to have well-controlled virus with good adherence and tolerance to Biktarvy.  Reviewed previous lab work and discussed plan of care and U equals U.  Check blood work.  Continue current dose of Biktarvy.  Plan for follow-up in 3 months or sooner if needed with lab work on the same day.      Relevant Medications   bictegravir-emtricitabine-tenofovir AF (BIKTARVY) 50-200-25 MG TABS tablet   Other Relevant Orders   COMPLETE METABOLIC PANEL WITH GFR   HIV-1 RNA quant-no reflex-bld   T-helper cell (CD4)- ([facility] clinic only)   TOBACCO USER    Ryan Cruz was previously prescribed nicotine patches to assist in tobacco cessation which he never started.  Continues to smoke daily.  Emphasized importance of tobacco cessation to reduce risk of complications and disease development in the future.      Healthcare maintenance    Discussed importance of safe sexual practice and condom use. Condoms and STD testing offered.  Vaccinations reviewed -COVID and influenza updated. Due for colonoscopy and will contact gastroenterology/general surgery Due for routine dental care which he will schedule independently. Anal Pap smear obtained      Screening for STDs (sexually transmitted diseases)   Relevant Orders   RPR   Major depressive disorder, recurrent episode, severe (HCC)    Mood is stable and currently followed by psychiatry.  No adverse side effects.  Suspect this is a contributing factor to his recent weight gain.  Continue current dose of trazodone, olanzapine, fluoxetine, and hydroxyzine per psychiatry.      Other Visit Diagnoses     Screening for rectal cancer    -  Primary   Relevant Orders   Cytology - PAP   Encounter for immunization       Relevant Orders   Flu vaccine trivalent  PF, 6mos and older(Flulaval,Afluria,Fluarix,Fluzone) (Completed)    Pfizer Comirnaty Covid-19 Vaccine 38yrs & older (Completed)        I have discontinued Pellegrino E. Syme's nicotine. I am also having him maintain his FLUoxetine, hydrOXYzine, traZODone, OLANZapine, amLODipine, and bictegravir-emtricitabine-tenofovir AF.   Meds ordered this encounter  Medications   amLODipine (NORVASC) 10 MG tablet    Sig: Take 1 tablet (10 mg total) by mouth daily.    Dispense:  30 tablet    Refill:  5    Order Specific Question:   Supervising Provider    Answer:   Drue Second, CYNTHIA [[id number]]   bictegravir-emtricitabine-tenofovir AF (BIKTARVY) 50-200-25 MG TABS tablet    Sig: Take 1 tablet by mouth daily.    Dispense:  30 tablet    Refill:  5    Order Specific Question:   Supervising Provider    Answer:   Judyann Munson [[id number]]     Follow-up: Return in about 3 months (around 03/17/2023). or sooner if needed.    Marcos Eke, MSN, FNP-C Nurse Practitioner [facility] Main number: [phone]

## 2022-12-15 NOTE — Assessment & Plan Note (Signed)
Blood pressure well-controlled and below goal of 140/90 with good adherence and tolerance to medication and no adverse side effects.  Does not currently check blood pressure at home.  No neurologic/ophthalmologic signs/symptoms.  Continue current dose of amlodipine.

## 2022-12-15 NOTE — Assessment & Plan Note (Signed)
Ryan Cruz was previously prescribed nicotine patches to assist in tobacco cessation which he never started.  Continues to smoke daily.  Emphasized importance of tobacco cessation to reduce risk of complications and disease development in the future.

## 2022-12-15 NOTE — Assessment & Plan Note (Signed)
Ryan Cruz continues to have well-controlled virus with good adherence and tolerance to [employer].  Reviewed previous lab work and discussed plan of care and U equals U.  Check blood work.  Continue current dose of Biktarvy.  Plan for follow-up in 3 months or sooner if needed with lab work on the same day.

## 2022-12-16 LAB — T-HELPER CELL (CD4) - (RCID CLINIC ONLY)
CD4 % Helper T Cell: 34 % (ref 33–65)
CD4 T Cell Abs: 566 /uL (ref 400–1790)

## 2022-12-17 LAB — COMPLETE METABOLIC PANEL WITH GFR
AG Ratio: 1.5 (calc) (ref 1.0–2.5)
ALT: 27 U/L (ref 9–46)
AST: 17 U/L (ref 10–35)
Albumin: 4.6 g/dL (ref 3.6–5.1)
Alkaline phosphatase (APISO): 70 U/L (ref 35–144)
BUN: 15 mg/dL (ref 7–25)
CO2: 27 mmol/L (ref 20–32)
Calcium: 9.9 mg/dL (ref 8.6–10.3)
Chloride: 108 mmol/L (ref 98–110)
Creat: 1.27 mg/dL (ref 0.70–1.30)
Globulin: 3.1 g/dL (ref 1.9–3.7)
Glucose, Bld: 95 mg/dL (ref 65–99)
Potassium: 4.3 mmol/L (ref 3.5–5.3)
Sodium: 139 mmol/L (ref 135–146)
Total Bilirubin: 0.5 mg/dL (ref 0.2–1.2)
Total Protein: 7.7 g/dL (ref 6.1–8.1)
eGFR: 67 mL/min/{1.73_m2} (ref >=60)

## 2022-12-17 LAB — HIV-1 RNA QUANT-NO REFLEX-BLD
HIV 1 RNA Quant: NOT DETECTED {copies}/mL
HIV-1 RNA Quant, Log: NOT DETECTED {Log_copies}/mL

## 2022-12-17 LAB — RPR: RPR Ser Ql: NONREACTIVE

## 2022-12-21 LAB — CYTOLOGY - PAP: Diagnosis: NEGATIVE

## 2023-01-14 ENCOUNTER — Ambulatory Visit (HOSPITAL_COMMUNITY): Payer: 59 | Admitting: Psychiatry

## 2023-03-30 ENCOUNTER — Ambulatory Visit: Payer: 59 | Admitting: Family

## 2023-04-26 ENCOUNTER — Encounter: Payer: Self-pay | Admitting: Family

## 2023-04-26 ENCOUNTER — Ambulatory Visit (INDEPENDENT_AMBULATORY_CARE_PROVIDER_SITE_OTHER): Payer: 59 | Admitting: Family

## 2023-04-26 ENCOUNTER — Other Ambulatory Visit: Payer: Self-pay

## 2023-04-26 VITALS — BP 139/82 | HR 64 | Temp 98.0°F | Ht 67.0 in | Wt 167.0 lb

## 2023-04-26 DIAGNOSIS — I10 Essential (primary) hypertension: Secondary | ICD-10-CM

## 2023-04-26 DIAGNOSIS — F17200 Nicotine dependence, unspecified, uncomplicated: Secondary | ICD-10-CM

## 2023-04-26 DIAGNOSIS — Z21 Asymptomatic human immunodeficiency virus [HIV] infection status: Secondary | ICD-10-CM

## 2023-04-26 DIAGNOSIS — Z9189 Other specified personal risk factors, not elsewhere classified: Secondary | ICD-10-CM | POA: Diagnosis not present

## 2023-04-26 DIAGNOSIS — B20 Human immunodeficiency virus [HIV] disease: Secondary | ICD-10-CM | POA: Diagnosis present

## 2023-04-26 DIAGNOSIS — F17210 Nicotine dependence, cigarettes, uncomplicated: Secondary | ICD-10-CM | POA: Diagnosis not present

## 2023-04-26 DIAGNOSIS — Z0000 Encounter for general adult medical examination without abnormal findings: Secondary | ICD-10-CM

## 2023-04-26 MED ORDER — BICTEGRAVIR-EMTRICITAB-TENOFOV 50-200-25 MG PO TABS
1.0000 | ORAL_TABLET | Freq: Every day | ORAL | 5 refills | Status: AC
Start: 2023-04-26 — End: 2023-10-23

## 2023-04-26 MED ORDER — HYDROCHLOROTHIAZIDE 12.5 MG PO TABS
12.5000 mg | ORAL_TABLET | Freq: Every day | ORAL | 2 refills | Status: DC
Start: 2023-04-26 — End: 2023-07-20

## 2023-04-26 NOTE — Patient Instructions (Addendum)
Nice to see you. ? ?We will check your lab work today. ? ?Continue to take your medication daily as prescribed. ? ?Refills have been sent to the pharmacy. ? ?Please call [facility]) to schedule/follow up on your dental care at [phone] ? ?Plan for follow up in 4 months or sooner if needed with lab work on the same day. ? ?Have a great day and stay safe! ? ?

## 2023-04-26 NOTE — Assessment & Plan Note (Signed)
 Mr. Bivins continues to smoke tobacco daily.  Discussed importance of tobacco cessation and counseled on available resources. Will need to check if he qualifies for CT lung cancer screening. Working on cutting back. Resources provided in AVS.

## 2023-04-26 NOTE — Assessment & Plan Note (Signed)
 Ryan Cruz blood pressure is okay and slightly above goal. Concern amlodipine may be contributing to his hand/foot swelling in the absence of any other symptoms or trauma/injury. Discontinue amlodipine and start hydrochlorothiazide. Monitor blood pressure following change of medication. Follow up if symptoms do not improve.

## 2023-04-26 NOTE — Progress Notes (Signed)
 Brief Narrative   Patient ID: Ryan Cruz, male    DOB: February 26, 1965, 58 y.o.   MRN: [id number]  Mr. Bordelon is a 58 y/o AA male diagnosed with HIV-1 disease in March 2007 with risk factor of MSM. Initial CD4 count and viral load are unavailable. Genosures with no significant medication resistant mutations. No history of opportunistic infection. [id number] negative. Previous ART experience with Atripla, Charlett Lango, and currently Biktarvy.    Subjective:    Chief Complaint  Patient presents with   Follow-up    B20, Hand swelling    HPI:  Merion Grimaldo Sigmund is a 58 y.o. male with HIV disease last seen on 12/15/2022 with well-controlled virus and good adherence and tolerance to [employer].  Viral load was undetectable with CD4 count 566.  Kidney function, liver function, electrolytes within normal ranges.  RPR was nonreactive for syphilis.  Here today for routine follow-up.  Mr. Cuffe has been doing okay since his last office visit and continues to take Biktarvy daily as prescribed with no adverse side effects or problems obtaining medication from the pharmacy.  Currently covered by Medicaid.  Not currently working.  Housing is stable with good access to food.  Transportation via personal vehicle.  Has concern today left hand and left foot swelling going on for approximately 3 weeks in the absence of trauma/injury.  Describes his jewelry feeling tight in his foot swelling at times.  Attempted treatments.  Condoms and site-specific STD testing offered.  Healthcare maintenance reviewed.  Continues to smoke approximately 1/4 pack of cigarettes per day.  Denies fevers, chills, night sweats, headaches, changes in vision, neck pain/stiffness, nausea, diarrhea, vomiting, lesions or rashes.  Lab Results  Component Value Date   CD4TCELL 34 12/15/2022   CD4TABS 566 12/15/2022   Lab Results  Component Value Date   HIV1RNAQUANT Not Detected 12/15/2022     Allergies  Allergen Reactions   Shellfish Allergy  Hives   Doxycycline Rash      Outpatient Medications Prior to Visit  Medication Sig Dispense Refill   FLUoxetine (PROZAC) 10 MG capsule Take 1 capsule (10 mg total) by mouth daily. 30 capsule 0   hydrOXYzine (ATARAX) 25 MG tablet Take 1 tablet (25 mg total) by mouth 3 (three) times daily as needed for anxiety. 30 tablet 0   OLANZapine (ZYPREXA) 10 MG tablet Take 10 mg by mouth at bedtime.     traZODone (DESYREL) 50 MG tablet Take 1 tablet (50 mg total) by mouth at bedtime as needed for sleep. 30 tablet 0   amLODipine (NORVASC) 10 MG tablet Take 1 tablet (10 mg total) by mouth daily. 30 tablet 5   bictegravir-emtricitabine-tenofovir AF (BIKTARVY) 50-200-25 MG TABS tablet Take 1 tablet by mouth daily. 30 tablet 5   No facility-administered medications prior to visit.     Past Medical History:  Diagnosis Date   Anxiety    Asthma    Depression    HIV (human immunodeficiency virus infection) (HCC)    Hypertension    Hypoglycemia    sugars drop ion     Past Surgical History:  Procedure Laterality Date   BIOPSY  08/18/2019   Procedure: BIOPSY;  Surgeon: Lynann Bologna, MD;  Location: [facility] ENDOSCOPY;  Service: Endoscopy;;   COLONOSCOPY  02/12/2012   Procedure: COLONOSCOPY;  Surgeon: Romie Levee, MD;  Location: [facility] ENDOSCOPY;  Service: Endoscopy;  Laterality: N/A;   ESOPHAGOGASTRODUODENOSCOPY (EGD) WITH PROPOFOL N/A 08/18/2019   Procedure: ESOPHAGOGASTRODUODENOSCOPY (EGD) WITH PROPOFOL;  Surgeon: Chales Abrahams,  Filbert Berthold, MD;  Location: [facility] ENDOSCOPY;  Service: Endoscopy;  Laterality: N/A;   HERNIA REPAIR     umbilibcal      Review of Systems  Constitutional:  Negative for chills, diaphoresis, fatigue and fever.  Respiratory:  Negative for cough, chest tightness, shortness of breath and wheezing.   Cardiovascular:  Negative for chest pain.  Gastrointestinal:  Negative for abdominal pain, diarrhea, nausea and vomiting.  Skin:        Left sided ankle and left hand swelling.       Objective:     BP 139/82   Pulse 64   Temp 98 F (36.7 C) (Oral)   Ht 5\' 7"  (1.702 m)   Wt 167 lb (75.8 kg)   SpO2 98%   BMI 26.16 kg/m  Nursing note and vital signs reviewed.  Physical Exam Constitutional:      General: He is not in acute distress.    Appearance: He is well-developed.  Cardiovascular:     Rate and Rhythm: Normal rate and regular rhythm.     Heart sounds: Normal heart sounds.  Pulmonary:     Effort: Pulmonary effort is normal.     Breath sounds: Normal breath sounds.  Skin:    General: Skin is warm and dry.     Comments: No obvious deformity, discoloration and mild edema of the left hand compared to the right. No obvious lower extremity swelling or pitting edema.   Neurological:     Mental Status: He is alert and oriented to person, place, and time.  Psychiatric:        Behavior: Behavior normal.        Thought Content: Thought content normal.        Judgment: Judgment normal.         12/15/2022    9:48 AM 06/29/2022   10:35 AM 09/15/2021    9:57 AM 04/09/2021    2:38 PM 07/31/2020   11:17 AM  Depression screen PHQ 2/9  Decreased Interest 0 0 0 0 1  Down, Depressed, Hopeless 1 0 0 0 1  PHQ - 2 Score 1 0 0 0 2  Altered sleeping     3  Tired, decreased energy     2  Change in appetite     1  Feeling bad or failure about yourself      2  Trouble concentrating     2  Moving slowly or fidgety/restless     2  Suicidal thoughts     1  PHQ-9 Score     15       Assessment & Plan:    Patient Active Problem List   Diagnosis Date Noted   Major depressive disorder, recurrent episode, severe (HCC) 09/08/2022   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   Stimulant use disorder 09/05/2022   Delusional disorder, persecutory type, multiple episodes, currently in acute episode (HCC) 09/04/2022   Cannabis use disorder 09/04/2022   Episodic methamphetamine abuse (HCC) 09/04/2022   Screening for STDs (sexually transmitted diseases) 09/15/2021   Therapeutic drug  monitoring 09/15/2021   Giardia 04/22/2020   Protein-calorie malnutrition, severe 04/22/2020   Diarrhea 04/21/2020   Acute diffuse otitis externa of left ear 03/15/2020   Numbness of feet 03/11/2020   Intractable vomiting with nausea    Nausea vomiting and diarrhea    AKI (acute kidney injury) (HCC) 08/14/2019   Foot lesion 06/05/2019   Healthcare maintenance 04/26/2018   Hepatitis B immune 11/18/2016   Medically noncompliant  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    Other headache syndrome    Optic neuritis 04/16/2016   Chest pain 11/20/2015   Hypoglycemia 01/29/2014   Allergic sinusitis 06/29/2012   Pilonidal cyst 01/04/2012   Depression 06/25/2010   CONDYLOMA ACUMINATUM 01/07/2010   Essential hypertension 12/12/2009   Ventral hernia 03/28/2009   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 Ishler blood pressure is okay and slightly above goal. Concern amlodipine may be contributing to his hand/foot swelling in the absence of any other symptoms or trauma/injury. Discontinue amlodipine and start hydrochlorothiazide. Monitor blood pressure following change of medication. Follow up if symptoms do not improve.       Relevant Medications   hydrochlorothiazide (HYDRODIURIL) 12.5 MG tablet     Other   HIV infection [facility])   Mr. Dejaynes continues to have well-controlled virus with good adherence and tolerance to [employer].  Reviewed previous lab work and discussed plan of care and U equals U.  Check blood work.  Continue current dose of Biktarvy.  Social determinants of health reviewed with no interventions indicated.  Covered by  Medicaid.  Plan for follow-up in 6 months or sooner if needed with lab work on the same day.      Relevant Medications   bictegravir-emtricitabine-tenofovir AF (BIKTARVY) 50-200-25 MG TABS tablet   Other Relevant Orders   COMPLETE METABOLIC PANEL WITH GFR   HIV-1 RNA quant-no reflex-bld   T-helper cells (CD4) count (not at [facility])   TOBACCO USER   Mr. Daluz continues to smoke tobacco daily.  Discussed importance of tobacco cessation and counseled on available resources. Will need to check if he qualifies for CT lung cancer screening. Working on cutting back. Resources provided in AVS.       Healthcare maintenance   Discussed importance of safe sexual practice and condom use. Condoms and site specific STD testing offered.  Vaccinations reviewed and declined.  Routine dental care up to date.  Due for colon cancer screening previous done by General Surgery and will check General Surgery vs. GI. Anal cancer screening discussed and declined today.         I have discontinued Nyaire E. Dolby's amLODipine. I am also having him start on hydrochlorothiazide. Additionally, I am having him maintain his FLUoxetine, hydrOXYzine, traZODone, OLANZapine, and bictegravir-emtricitabine-tenofovir AF.   Meds ordered this encounter  Medications   hydrochlorothiazide (HYDRODIURIL) 12.5 MG tablet    Sig: Take 1 tablet (12.5 mg total) by mouth daily.    Dispense:  30 tablet    Refill:  2    Discontinue amlodipine    Supervising Provider:   Judyann Munson [[id number]]   bictegravir-emtricitabine-tenofovir AF (BIKTARVY) 50-200-25 MG TABS tablet    Sig: Take 1 tablet by mouth daily.    Dispense:  30 tablet    Refill:  5    Supervising Provider:   Judyann Munson [id number]    Prescription Type::   Renewal     Follow-up: Return in about 3 months (around 07/27/2023). or sooner if needed.    Marcos Eke, MSN, FNP-C Nurse Practitioner [facility] for [facility]ious Disease [facility]  [facility] Main  number: [phone]

## 2023-04-26 NOTE — Assessment & Plan Note (Signed)
 Discussed importance of safe sexual practice and condom use. Condoms and site specific STD testing offered.  Vaccinations reviewed and declined.  Routine dental care up to date.  Due for colon cancer screening previous done by General Surgery and will check General Surgery vs. GI. Anal cancer screening discussed and declined today.

## 2023-04-26 NOTE — Assessment & Plan Note (Signed)
 Ryan Cruz continues to have well-controlled virus with good adherence and tolerance to [employer].  Reviewed previous lab work and discussed plan of care and U equals U.  Check blood work.  Continue current dose of Biktarvy.  Social determinants of health reviewed with no interventions indicated.  Covered by Medicaid.  Plan for follow-up in 6 months or sooner if needed with lab work on the same day.

## 2023-04-27 LAB — COMPLETE METABOLIC PANEL WITH GFR
AG Ratio: 1.6 (calc) (ref 1.0–2.5)
ALT: 20 U/L (ref 9–46)
AST: 15 U/L (ref 10–35)
Albumin: 5 g/dL (ref 3.6–5.1)
Alkaline phosphatase (APISO): 76 U/L (ref 35–144)
BUN: 13 mg/dL (ref 7–25)
CO2: 22 mmol/L (ref 20–32)
Calcium: 9.7 mg/dL (ref 8.6–10.3)
Chloride: 106 mmol/L (ref 98–110)
Creat: 1.26 mg/dL (ref 0.70–1.30)
Globulin: 3.1 g/dL (ref 1.9–3.7)
Glucose, Bld: 93 mg/dL (ref 65–99)
Potassium: 4 mmol/L (ref 3.5–5.3)
Sodium: 138 mmol/L (ref 135–146)
Total Bilirubin: 0.7 mg/dL (ref 0.2–1.2)
Total Protein: 8.1 g/dL (ref 6.1–8.1)
eGFR: 67 mL/min/{1.73_m2} (ref >=60)

## 2023-04-28 LAB — COMPREHENSIVE METABOLIC PANEL
AG Ratio: 1.6 (calc) (ref 1.0–2.5)
ALT: 20 U/L (ref 9–46)
AST: 15 U/L (ref 10–35)
Albumin: 5 g/dL (ref 3.6–5.1)
Alkaline phosphatase (APISO): 76 U/L (ref 35–144)
BUN: 13 mg/dL (ref 7–25)
CO2: 22 mmol/L (ref 20–32)
Calcium: 9.7 mg/dL (ref 8.6–10.3)
Chloride: 106 mmol/L (ref 98–110)
Creat: 1.26 mg/dL (ref 0.70–1.30)
Globulin: 3.1 g/dL (ref 1.9–3.7)
Glucose, Bld: 93 mg/dL (ref 65–99)
Potassium: 4 mmol/L (ref 3.5–5.3)
Sodium: 138 mmol/L (ref 135–146)
Total Bilirubin: 0.7 mg/dL (ref 0.2–1.2)
Total Protein: 8.1 g/dL (ref 6.1–8.1)
eGFR: 67 mL/min/{1.73_m2} (ref >=60)

## 2023-04-28 LAB — T-HELPER CELLS (CD4) COUNT (NOT AT ARMC)
Absolute CD4: 650 {cells}/uL (ref 490–1740)
CD4 T Helper %: 32 % (ref 30–61)
Total lymphocyte count: 2001 {cells}/uL (ref 850–3900)

## 2023-04-28 LAB — HIV-1 RNA QUANT-NO REFLEX-BLD
HIV 1 RNA Quant: 68 {copies}/mL — ABNORMAL HIGH
HIV-1 RNA Quant, Log: 1.83 {Log_copies}/mL — ABNORMAL HIGH

## 2023-05-25 ENCOUNTER — Ambulatory Visit (AMBULATORY_SURGERY_CENTER)

## 2023-05-25 VITALS — Ht 67.0 in | Wt 168.0 lb

## 2023-05-25 DIAGNOSIS — Z1211 Encounter for screening for malignant neoplasm of colon: Secondary | ICD-10-CM

## 2023-05-25 MED ORDER — PEG 3350-KCL-NA BICARB-NACL 420 G PO SOLR: 4000.0000 mL | Freq: Once | ORAL | 0 refills | Status: AC

## 2023-05-25 MED ORDER — BISACODYL EC 5 MG PO TBEC: 5.0000 mg | DELAYED_RELEASE_TABLET | ORAL | 0 refills | Status: DC

## 2023-05-25 NOTE — Progress Notes (Signed)
 No egg or soy allergy known to patient  No issues known to pt with past sedation with any surgeries or procedures Patient denies ever being told they had issues or difficulty with intubation  No FH of Malignant Hyperthermia Pt is not on diet pills Pt is not on  home 02  Pt is not on blood thinners  Pt denies issues with constipation  No A fib or A flutter Have any cardiac testing pending-- no  LOA: independent  Prep: Golytely   Patient's chart reviewed by Cathlyn Parsons CNRA prior to previsit and patient appropriate for the [facility].  Previsit completed and red dot placed by patient's name on their procedure day (on provider's schedule).     PV completed with patient. Prep instructions sent via mychart and home address.

## 2023-06-21 NOTE — Progress Notes (Deleted)
  Gastroenterology History and Physical   Primary Care Physician:  Joaquin Mulberry, MD   Reason for Procedure:  Colon cancer screening  Plan:    Colonoscopy     HPI: Ryan Cruz is a 58 y.o. male here for screening colonoscopy.  He had a colonoscopy in 2014 and had polyps that were not precancerous removed (Dr. Andy Bannister).   Past Medical History:  Diagnosis Date   Anxiety    Asthma    Depression    HIV (human immunodeficiency virus infection) (HCC)    Hypertension    Hypoglycemia    sugars drop ion    Past Surgical History:  Procedure Laterality Date   BIOPSY  08/18/2019   Procedure: BIOPSY;  Surgeon: Lajuan Pila, MD;  Location: [facility] ENDOSCOPY;  Service: Endoscopy;;   COLONOSCOPY  02/12/2012   Procedure: COLONOSCOPY;  Surgeon: Joyce Nixon, MD;  Location: [facility] ENDOSCOPY;  Service: Endoscopy;  Laterality: N/A;   ESOPHAGOGASTRODUODENOSCOPY (EGD) WITH PROPOFOL  N/A 08/18/2019   Procedure: ESOPHAGOGASTRODUODENOSCOPY (EGD) WITH PROPOFOL ;  Surgeon: Lajuan Pila, MD;  Location: [facility] ENDOSCOPY;  Service: Endoscopy;  Laterality: N/A;   HERNIA REPAIR     umbilibcal    Prior to Admission medications   Medication Sig Start Date End Date Taking? Authorizing Provider  bictegravir-emtricitabine -tenofovir  AF (BIKTARVY ) 50-200-25 MG TABS tablet Take 1 tablet by mouth daily. 04/26/23 10/23/23  Calone, Gregory D, FNP  bisacodyl  5 MG EC tablet Take 1 tablet (5 mg total) by mouth as directed. 05/25/23   Kenney Peacemaker, MD  FLUoxetine  (PROZAC ) 10 MG capsule Take 1 capsule (10 mg total) by mouth daily. 09/09/22 05/25/23  Massengill, Elana Grayer, MD  hydrochlorothiazide  (HYDRODIURIL ) 12.5 MG tablet Take 1 tablet (12.5 mg total) by mouth daily. 04/26/23   Calone, Gregory D, FNP  hydrOXYzine  (ATARAX ) 25 MG tablet Take 1 tablet (25 mg total) by mouth 3 (three) times daily as needed for anxiety. 09/08/22   Massengill, Elana Grayer, MD  OLANZapine  (ZYPREXA ) 10 MG tablet Take 10 mg by mouth at bedtime.    [provider]  traZODone  (DESYREL ) 50 MG tablet Take 1 tablet (50 mg total) by mouth at bedtime as needed for sleep. 09/08/22 05/25/23  Eleanore Grey, MD    Current Outpatient Medications  Medication Sig Dispense Refill   bictegravir-emtricitabine -tenofovir  AF (BIKTARVY ) 50-200-25 MG TABS tablet Take 1 tablet by mouth daily. 30 tablet 5   bisacodyl  5 MG EC tablet Take 1 tablet (5 mg total) by mouth as directed. 4 tablet 0   FLUoxetine  (PROZAC ) 10 MG capsule Take 1 capsule (10 mg total) by mouth daily. 30 capsule 0   hydrochlorothiazide  (HYDRODIURIL ) 12.5 MG tablet Take 1 tablet (12.5 mg total) by mouth daily. 30 tablet 2   hydrOXYzine  (ATARAX ) 25 MG tablet Take 1 tablet (25 mg total) by mouth 3 (three) times daily as needed for anxiety. 30 tablet 0   OLANZapine  (ZYPREXA ) 10 MG tablet Take 10 mg by mouth at bedtime.     traZODone  (DESYREL ) 50 MG tablet Take 1 tablet (50 mg total) by mouth at bedtime as needed for sleep. 30 tablet 0   No current facility-administered medications for this visit.    Allergies as of 06/22/2023 - Review Complete 05/25/2023  Allergen Reaction Noted   Shellfish allergy Hives 09/24/2020   Doxycycline  Rash 09/15/2018    Family History  Problem Relation Age of Onset   Diabetes Mother    Hypertension Mother    Heart disease Mother    Congestive Heart Failure Mother  Diabetes Father    Hypertension Father    Diabetes Sister    HIV Sister    Hypertension Sister    Diabetes Brother    Hypertension Brother    Diabetes Brother    Hypertension Brother    Diabetes Brother    Hypertension Brother    Diabetes Brother    Hypertension Brother    Colon cancer Neg Hx    Rectal cancer Neg Hx    Stomach cancer Neg Hx     Social History   Socioeconomic History   Marital status: Married    Spouse name: Not on file   Number of children: Not on file   Years of education: Not on file   Highest education level: Not on file  Occupational History    Occupation: medication aide and hairstylist  Tobacco Use   Smoking status: Every Day    Current packs/day: 0.10    Average packs/day: 0.1 packs/day for 15.0 years (1.5 ttl pk-yrs)    Types: Cigarettes   Smokeless tobacco: Never   Tobacco comments:    States he uses cigarettes about twice a day, trying to quit  Substance and Sexual Activity   Alcohol use: Yes    Alcohol/week: 2.0 standard drinks of alcohol    Types: 1 Glasses of wine, 1 Shots of liquor per week    Comment: rarely   Drug use: Yes    Frequency: 7.0 times per week    Types: Marijuana    Comment: marijuana couple of times weekly; no meth since [facility] d/c; a c   Sexual activity: Yes    Partners: Male    Comment: declined condoms  Other Topics Concern   Not on file  Social History Narrative   Not on file   Social Drivers of Health   Financial Resource Strain: Not on file  Food Insecurity: No Food Insecurity (09/04/2022)   Hunger Vital Sign    Worried About Running Out of Food in the Last Year: Never true    Ran Out of Food in the Last Year: Never true  Transportation Needs: No Transportation Needs (09/04/2022)   PRAPARE - Administrator, Civil Service (Medical): No    Lack of Transportation (Non-Medical): No  Physical Activity: Not on file  Stress: Not on file  Social Connections: Not on file  Intimate Partner Violence: Not At Risk (09/04/2022)   Humiliation, Afraid, Rape, and Kick questionnaire    Fear of Current or Ex-Partner: No    Emotionally Abused: No    Physically Abused: No    Sexually Abused: No    Review of Systems: Positive for *** All other review of systems negative except as mentioned in the HPI.  Physical Exam: Vital signs There were no vitals taken for this visit.  General:   Alert,  Well-developed, well-nourished, pleasant and cooperative in NAD Lungs:  Clear throughout to auscultation.   Heart:  Regular rate and rhythm; no murmurs, clicks, rubs,  or gallops. Abdomen:  Soft,  nontender and nondistended. Normal bowel sounds.   Neuro/Psych:  Alert and cooperative. Normal mood and affect. A and O x 3   @Xion Debruyne  Tammie Fall, MD, [facility] [phone] (pager) 06/21/2023 4:50 PM@

## 2023-06-22 ENCOUNTER — Telehealth: Payer: Self-pay | Admitting: Internal Medicine

## 2023-06-22 ENCOUNTER — Encounter: Admitting: Internal Medicine

## 2023-06-22 NOTE — Telephone Encounter (Signed)
 Good Morning Dr.Gessner,  I tried calling this patient today at 7:35am to see if he was coming for his procedure.   I could not leave a msg no voicemail.   I will NO SHOW him.  medicaid

## 2023-07-20 ENCOUNTER — Other Ambulatory Visit: Payer: Self-pay | Admitting: Family

## 2023-07-20 DIAGNOSIS — I10 Essential (primary) hypertension: Secondary | ICD-10-CM

## 2023-07-27 ENCOUNTER — Ambulatory Visit (INDEPENDENT_AMBULATORY_CARE_PROVIDER_SITE_OTHER): Admitting: Family

## 2023-07-27 ENCOUNTER — Encounter: Payer: Self-pay | Admitting: Family

## 2023-07-27 ENCOUNTER — Other Ambulatory Visit: Payer: Self-pay

## 2023-07-27 VITALS — BP 138/86 | HR 65 | Temp 98.6°F | Wt 168.0 lb

## 2023-07-27 DIAGNOSIS — F332 Major depressive disorder, recurrent severe without psychotic features: Secondary | ICD-10-CM

## 2023-07-27 DIAGNOSIS — Z0000 Encounter for general adult medical examination without abnormal findings: Secondary | ICD-10-CM

## 2023-07-27 DIAGNOSIS — Z21 Asymptomatic human immunodeficiency virus [HIV] infection status: Secondary | ICD-10-CM

## 2023-07-27 DIAGNOSIS — M25511 Pain in right shoulder: Secondary | ICD-10-CM | POA: Diagnosis not present

## 2023-07-27 DIAGNOSIS — I10 Essential (primary) hypertension: Secondary | ICD-10-CM | POA: Diagnosis not present

## 2023-07-27 MED ORDER — HYDROXYZINE HCL 25 MG PO TABS: 25.0000 mg | ORAL_TABLET | Freq: Three times a day (TID) | ORAL | 1 refills | Status: DC | PRN

## 2023-07-27 MED ORDER — BICTEGRAVIR-EMTRICITAB-TENOFOV 50-200-25 MG PO TABS: 1.0000 | ORAL_TABLET | Freq: Every day | ORAL | 5 refills | Status: DC

## 2023-07-27 MED ORDER — TRAZODONE HCL 50 MG PO TABS: 50.0000 mg | ORAL_TABLET | Freq: Every evening | ORAL | 1 refills | Status: DC | PRN

## 2023-07-27 MED ORDER — FLUOXETINE HCL 10 MG PO CAPS: 10.0000 mg | ORAL_CAPSULE | Freq: Every day | ORAL | 1 refills | Status: DC

## 2023-07-27 MED ORDER — HYDROCHLOROTHIAZIDE 12.5 MG PO TABS: 12.5000 mg | ORAL_TABLET | Freq: Every day | ORAL | 1 refills | Status: DC

## 2023-07-27 MED ORDER — OLANZAPINE 10 MG PO TABS: 10.0000 mg | ORAL_TABLET | Freq: Every day | ORAL | 1 refills | Status: DC

## 2023-07-27 NOTE — Assessment & Plan Note (Signed)
 Mr. Cheema has acute shoulder pain in the absence of trauma/injury concerning for possible tendinitis/bursitis or rotator cuff injury.  Recommend ice and home exercise therapy and follow-up with sports medicine for further evaluation and treatment.

## 2023-07-27 NOTE — Patient Instructions (Addendum)
 Nice to see you.  Continue to take your medication daily as prescribed.  Refills have been sent to the pharmacy.  Plan for follow up in 4 months or sooner if needed with lab work on the same day.  Have a great day and stay safe!   [facility] [place] - tell them with [facility] General Contact [phone] Crisis Line [phone]  [facility] or [facility]   Ice x 20 minutes every 2 hours and can consider Voltaren gel.

## 2023-07-27 NOTE — Progress Notes (Signed)
 Brief Narrative   Patient ID: Ryan Cruz, male    DOB: 21-Aug-1965, 58 y.o.   MRN: [id number]  Ryan Cruz is a 58 y/o AA male diagnosed with HIV-1 disease in March 2007 with risk factor of MSM. Initial CD4 count and viral load are unavailable. Genosures with no significant medication resistant mutations. No history of opportunistic infection. [id number] negative. Previous ART experience with Atripla, Odefsey , and currently Biktarvy .    Subjective:   Chief Complaint  Patient presents with   Follow-up    Right shoulder concerns 2 months- not able to lift certain height/ pain/ not able to lie achy pain    HPI:  Ryan Cruz is a 58 y.o. male with HIV disease last seen on 04/26/2023 with well-controlled virus and good adherence and tolerance to Biktarvy .  Viral load was 68 with CD4 count 650.  Kidney function, liver function, electrolytes within normal ranges.  Here today for routine follow-up.  Ryan Cruz has been doing okay since his last office visit and continues to take Biktarvy  as prescribed with no adverse side effects or problems obtaining medication from the pharmacy.  Covered by Medicaid.  Has concerns about his right shoulder has been going on for approximately 2 months in the absence of trauma or injury.  Primarily located on the lateral aspect of the shoulder and described as dull achy with difficulty reaching back when bathing and has progressively worsened since initial onset.  Pain is primarily with activity and not necessarily at rest and worse at night.  Unable to lay on the right side secondary to pain.  Pain has been refractory to Biofreeze and acetaminophen .  No previous history of right shoulder injury.  Mood has been stable with current dose of fluoxetine  and olanzapine .  Follows with psychiatry and is considering changing to a different psychiatrist.  No suicidal ideations and denies hallucinations. Not currently in counseling and may be interested.   Housing, access to  food, and transportation are stable.  Healthcare maintenance reviewed condoms and site-specific STD testing offered.  Denies fevers, chills, night sweats, headaches, changes in vision, neck pain/stiffness, nausea, diarrhea, vomiting, lesions or rashes.  Lab Results  Component Value Date   CD4TCELL 32 04/26/2023   CD4TABS 566 12/15/2022   Lab Results  Component Value Date   HIV1RNAQUANT 68 (H) 04/26/2023     Allergies  Allergen Reactions   Shellfish Allergy Hives   Doxycycline  Rash      Outpatient Medications Prior to Visit  Medication Sig Dispense Refill   bictegravir-emtricitabine -tenofovir  AF (BIKTARVY ) 50-200-25 MG TABS tablet Take 1 tablet by mouth daily. 30 tablet 5   FLUoxetine  (PROZAC ) 10 MG capsule Take 1 capsule (10 mg total) by mouth daily. 30 capsule 0   hydrochlorothiazide  (HYDRODIURIL ) 12.5 MG tablet TAKE 1 TABLET BY MOUTH EVERY DAY 30 tablet 0   hydrOXYzine  (ATARAX ) 25 MG tablet Take 1 tablet (25 mg total) by mouth 3 (three) times daily as needed for anxiety. 30 tablet 0   OLANZapine  (ZYPREXA ) 10 MG tablet Take 10 mg by mouth at bedtime.     traZODone  (DESYREL ) 50 MG tablet Take 1 tablet (50 mg total) by mouth at bedtime as needed for sleep. 30 tablet 0   bisacodyl  5 MG EC tablet Take 1 tablet (5 mg total) by mouth as directed. 4 tablet 0   No facility-administered medications prior to visit.     Past Medical History:  Diagnosis Date   Anxiety    Asthma  Depression    HIV (human immunodeficiency virus infection) (HCC)    Hypertension    Hypoglycemia    sugars drop ion     Past Surgical History:  Procedure Laterality Date   BIOPSY  08/18/2019   Procedure: BIOPSY;  Surgeon: Lajuan Pila, MD;  Location: [facility] ENDOSCOPY;  Service: Endoscopy;;   COLONOSCOPY  02/12/2012   Procedure: COLONOSCOPY;  Surgeon: Joyce Nixon, MD;  Location: [facility] ENDOSCOPY;  Service: Endoscopy;  Laterality: N/A;   ESOPHAGOGASTRODUODENOSCOPY (EGD) WITH PROPOFOL  N/A 08/18/2019    Procedure: ESOPHAGOGASTRODUODENOSCOPY (EGD) WITH PROPOFOL ;  Surgeon: Lajuan Pila, MD;  Location: [facility] ENDOSCOPY;  Service: Endoscopy;  Laterality: N/A;   HERNIA REPAIR     umbilibcal        Review of Systems  Constitutional:  Negative for appetite change, chills, fatigue, fever and unexpected weight change.  Eyes:  Negative for visual disturbance.  Respiratory:  Negative for cough, chest tightness, shortness of breath and wheezing.   Cardiovascular:  Negative for chest pain and leg swelling.  Gastrointestinal:  Negative for abdominal pain, constipation, diarrhea, nausea and vomiting.  Genitourinary:  Negative for dysuria, flank pain, frequency, genital sores, hematuria and urgency.  Musculoskeletal:        Positive for right shoulder pain  Skin:  Negative for rash.  Allergic/Immunologic: Negative for immunocompromised state.  Neurological:  Negative for dizziness and headaches.     Objective:   BP 138/86   Pulse 65   Temp 98.6 F (37 C) (Oral)   Wt 168 lb (76.2 kg)   SpO2 98%   BMI 26.31 kg/m  Nursing note and vital signs reviewed.  Physical Exam Constitutional:      General: He is not in acute distress.    Appearance: He is well-developed.   Cardiovascular:     Rate and Rhythm: Normal rate and regular rhythm.     Heart sounds: Normal heart sounds.  Pulmonary:     Effort: Pulmonary effort is normal.     Breath sounds: Normal breath sounds.   Musculoskeletal:     Comments: Right shoulder with no obvious deformity, discoloration, or edema.  Tenderness elicited over subacromial space and rotator cuff tendons.  Range of motion limited in abduction and external rotation as well as adduction and internal rotation.  No cervical pain.  Distal pulses and sensation intact and appropriate.   Skin:    General: Skin is warm and dry.   Neurological:     Mental Status: He is alert and oriented to person, place, and time.   Psychiatric:        Behavior: Behavior normal.         Thought Content: Thought content normal.        Judgment: Judgment normal.          07/27/2023   11:14 AM 12/15/2022    9:48 AM 06/29/2022   10:35 AM 09/15/2021    9:57 AM 04/09/2021    2:38 PM  Depression screen PHQ 2/9  Decreased Interest 1 0 0 0 0  Down, Depressed, Hopeless 1 1 0 0 0  PHQ - 2 Score 2 1 0 0 0  Altered sleeping 1      Tired, decreased energy 1      Change in appetite 2      Trouble concentrating 0      Moving slowly or fidgety/restless 0      Suicidal thoughts 0      PHQ-9 Score 6      Difficult  doing work/chores Somewhat difficult            07/27/2023   11:14 AM 07/31/2020   11:17 AM 09/05/2019   11:15 AM 05/26/2016    8:55 AM  GAD 7 : Generalized Anxiety Score  Nervous, Anxious, on Edge 1 2 2 3   Control/stop worrying 1 2 2 3   Worry too much - different things 1 2 2 3   Trouble relaxing 1 2 2 3   Restless 0 2 2 2   Easily annoyed or irritable 1 2 2 3   Afraid - awful might happen 0 1 2 1   Total GAD 7 Score 5 13 14 18   Anxiety Difficulty Somewhat difficult        The 10-year ASCVD risk score (Arnett DK, et al., 2019) is: 20.5%   Values used to calculate the score:     Age: 60 years     Clincally relevant sex: Male     Is Non-Hispanic African American: Yes     Diabetic: No     Tobacco smoker: Yes     Systolic Blood Pressure: 138 mmHg     Is BP treated: Yes     HDL Cholesterol: 58 mg/dL     Total Cholesterol: 193 mg/dL      Assessment & Plan:    Patient Active Problem List   Diagnosis Date Noted   Acute pain of right shoulder 07/27/2023   Major depressive disorder, recurrent episode, severe (HCC) 09/08/2022   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   Stimulant use disorder 09/05/2022   Delusional disorder, persecutory type, multiple episodes, currently in acute episode (HCC) 09/04/2022   Cannabis use disorder 09/04/2022   Episodic methamphetamine abuse (HCC) 09/04/2022   Screening for STDs (sexually transmitted diseases)  09/15/2021   Therapeutic drug monitoring 09/15/2021   Giardia 04/22/2020   Protein-calorie malnutrition, severe 04/22/2020   Diarrhea 04/21/2020   Acute diffuse otitis externa of left ear 03/15/2020   Numbness of feet 03/11/2020   Intractable vomiting with nausea    Nausea vomiting and diarrhea    AKI (acute kidney injury) (HCC) 08/14/2019   Foot lesion 06/05/2019   Healthcare maintenance 04/26/2018   Hepatitis B immune 11/18/2016   Medically noncompliant    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    Other headache syndrome    Optic neuritis 04/16/2016   Chest pain 11/20/2015   Hypoglycemia 01/29/2014   Allergic sinusitis 06/29/2012   Pilonidal cyst 01/04/2012   Depression 06/25/2010   CONDYLOMA ACUMINATUM 01/07/2010   Essential hypertension 12/12/2009   Ventral hernia 03/28/2009   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  Reviewed previous lab work and discussed plan of care and U equals U.  No problems obtaining medication from the pharmacy and covered by Medicaid.  Social determinants of health reviewed with no interventions indicated.  Continue current dose of Biktarvy .  Plan for follow-up in 4 months or sooner if needed  with lab work on the same day.       Relevant Medications   bictegravir-emtricitabine -tenofovir  AF (BIKTARVY ) 50-200-25 MG TABS tablet   Healthcare maintenance   Discussed importance of safe sexual  practice and condom use. Condoms and site specific STD testing offered.  Vaccinations reviewed and deferred following counseling.       Major depressive disorder, recurrent episode, severe (HCC)   Mood has been stable with occasional situational exacerbations of depression. Good adherence and tolerance to fluoxetine  and olanzapine . No suicidal ideation or signs of psychosis. Encouraged counseling with contact information provided in AVS. Continue current dose of fluoxetine  and olanzapine .       Relevant Medications   FLUoxetine  (PROZAC ) 10 MG capsule   hydrOXYzine  (ATARAX ) 25 MG tablet   traZODone  (DESYREL ) 50 MG tablet   Acute pain of right shoulder   Ryan Cruz has acute shoulder pain in the absence of trauma/injury concerning for possible tendinitis/bursitis or rotator cuff injury.  Recommend ice and home exercise therapy and follow-up with sports medicine for further evaluation and treatment.         I have changed Ryan Cruz's OLANZapine  and hydrochlorothiazide . I am also having him maintain his bisacodyl , bictegravir-emtricitabine -tenofovir  AF, FLUoxetine , hydrOXYzine , and traZODone .   Meds ordered this encounter  Medications   bictegravir-emtricitabine -tenofovir  AF (BIKTARVY ) 50-200-25 MG TABS tablet    Sig: Take 1 tablet by mouth daily.    Dispense:  30 tablet    Refill:  5    Supervising Provider:   SNIDER, CYNTHIA [phone]    Prescription Type::   Renewal   FLUoxetine  (PROZAC ) 10 MG capsule    Sig: Take 1 capsule (10 mg total) by mouth daily.    Dispense:  30 capsule    Refill:  1    Supervising Provider:   SNIDER, CYNTHIA [[id number]]   OLANZapine  (ZYPREXA ) 10 MG tablet    Sig: Take 1 tablet (10 mg total) by mouth at bedtime.    Dispense:  30 tablet    Refill:  1    Supervising Provider:   SNIDER, CYNTHIA [[id number]]   hydrochlorothiazide  (HYDRODIURIL ) 12.5 MG tablet    Sig: Take 1 tablet (12.5 mg total) by mouth daily.    Dispense:  90 tablet    Refill:  1    Supervising  Provider:   SNIDER, CYNTHIA [[id number]]   hydrOXYzine  (ATARAX ) 25 MG tablet    Sig: Take 1 tablet (25 mg total) by mouth 3 (three) times daily as needed for anxiety.    Dispense:  30 tablet    Refill:  1    Supervising Provider:   SNIDER, CYNTHIA [[id number]]   traZODone  (DESYREL ) 50 MG tablet    Sig: Take 1 tablet (50 mg total) by mouth at bedtime as needed for sleep.    Dispense:  30 tablet    Refill:  1    Supervising Provider:   Liane Redman [[id number]]     Follow-up: Return in about 4 months (around 11/26/2023). or sooner if needed.    Marlan Silva, MSN, FNP-C Nurse Practitioner [facility] Main number: [phone]

## 2023-07-27 NOTE — Assessment & Plan Note (Signed)
 Ryan Cruz continues to have well-controlled virus with good adherence and tolerance to Biktarvy .  Reviewed previous lab work and discussed plan of care and U equals U.  No problems obtaining medication from the pharmacy and covered by Medicaid.  Social determinants of health reviewed with no interventions indicated.  Continue current dose of Biktarvy .  Plan for follow-up in 4 months or sooner if needed with lab work on the same day.

## 2023-07-27 NOTE — Assessment & Plan Note (Signed)
 Mood has been stable with occasional situational exacerbations of depression. Good adherence and tolerance to fluoxetine  and olanzapine . No suicidal ideation or signs of psychosis. Encouraged counseling with contact information provided in AVS. Continue current dose of fluoxetine  and olanzapine .

## 2023-07-27 NOTE — Assessment & Plan Note (Signed)
 Discussed importance of safe sexual practice and condom use. Condoms and site specific STD testing offered.  Vaccinations reviewed and deferred following counseling.

## 2023-08-02 ENCOUNTER — Telehealth: Payer: Self-pay

## 2023-08-02 NOTE — Telephone Encounter (Signed)
 PA for Olanzapine  submitted through covermymeds for the patient.  PA approved until 08/01/24.  Approval faxed to the pharmacy for the patient.  Chrisa Hassan ONEIDA Ligas, CMA

## 2023-11-15 ENCOUNTER — Encounter: Payer: Self-pay | Admitting: Family

## 2023-11-15 ENCOUNTER — Ambulatory Visit (INDEPENDENT_AMBULATORY_CARE_PROVIDER_SITE_OTHER): Admitting: Family

## 2023-11-15 ENCOUNTER — Other Ambulatory Visit: Payer: Self-pay

## 2023-11-15 VITALS — BP 128/85 | HR 62 | Temp 98.1°F | Wt 161.0 lb

## 2023-11-15 DIAGNOSIS — Z9189 Other specified personal risk factors, not elsewhere classified: Secondary | ICD-10-CM | POA: Insufficient documentation

## 2023-11-15 DIAGNOSIS — Z113 Encounter for screening for infections with a predominantly sexual mode of transmission: Secondary | ICD-10-CM

## 2023-11-15 DIAGNOSIS — Z21 Asymptomatic human immunodeficiency virus [HIV] infection status: Secondary | ICD-10-CM

## 2023-11-15 DIAGNOSIS — Z79899 Other long term (current) drug therapy: Secondary | ICD-10-CM | POA: Diagnosis not present

## 2023-11-15 DIAGNOSIS — Z0000 Encounter for general adult medical examination without abnormal findings: Secondary | ICD-10-CM

## 2023-11-15 DIAGNOSIS — I10 Essential (primary) hypertension: Secondary | ICD-10-CM

## 2023-11-15 DIAGNOSIS — F331 Major depressive disorder, recurrent, moderate: Secondary | ICD-10-CM

## 2023-11-15 MED ORDER — BICTEGRAVIR-EMTRICITAB-TENOFOV 50-200-25 MG PO TABS: 1.0000 | ORAL_TABLET | Freq: Every day | ORAL | 5 refills | Status: AC

## 2023-11-15 MED ORDER — OLANZAPINE 10 MG PO TABS: 10.0000 mg | ORAL_TABLET | Freq: Every day | ORAL | 5 refills | Status: AC

## 2023-11-15 MED ORDER — HYDROXYZINE HCL 25 MG PO TABS: 25.0000 mg | ORAL_TABLET | Freq: Three times a day (TID) | ORAL | 1 refills | Status: AC | PRN

## 2023-11-15 MED ORDER — HYDROCHLOROTHIAZIDE 12.5 MG PO TABS
12.5000 mg | ORAL_TABLET | Freq: Every day | ORAL | 1 refills | Status: AC
Start: 2023-11-15 — End: ?

## 2023-11-15 NOTE — Assessment & Plan Note (Signed)
 Ryan Cruz is at increased risk for cardiovascular disease with 10-year ASCVD risk score of approximately 18%.  He may benefit from starting statin medication to reduce risk of cardiovascular disease and HIV associated inflammation.  Will consider at next office visit.

## 2023-11-15 NOTE — Patient Instructions (Addendum)
 Nice to see you. ? ?We will check your lab work today. ? ?Continue to take your medication daily as prescribed. ? ?Refills have been sent to the pharmacy. ? ?Plan for follow up in 6 months or sooner if needed with lab work on the same day. ? ?Have a great day and stay safe! ? ?

## 2023-11-15 NOTE — Assessment & Plan Note (Signed)
 Discussed importance of safe sexual practice and condom use. Condoms and site specific STD testing offered.  Influenza previously updated.  Other vaccines were deferred following counseling Due for colonoscopy and encouraged to contact gastroenterology

## 2023-11-15 NOTE — Assessment & Plan Note (Signed)
 Blood pressure adequately controlled with current dose of hydrochlorothiazide .  No adverse side effects or hypotensive readings.  Encouraged to monitor blood pressure at home and follow low-sodium diet.  Continue current dose of hydrochlorothiazide .

## 2023-11-15 NOTE — Assessment & Plan Note (Signed)
 Previously care and psychiatry and on fluoxetine  and olanzapine  with stable mood.  Has since discontinued taking fluoxetine  and continues to take olanzapine .  No suicidal ideations or signs of psychosis.  Mood has overall been stable.  Discussed potentially discontinuing olanzapine  at next office visit.  Continue hydroxyzine  as needed for anxiety.

## 2023-11-15 NOTE — Progress Notes (Signed)
 Brief Narrative   Patient ID: Ryan Cruz, male    DOB: 1965-03-26, 58 y.o.   MRN: [id number]  Ryan Cruz is a 58 y/o AA male diagnosed with HIV-1 disease in March 2007 with risk factor of MSM. Initial CD4 count and viral load are unavailable. Genosures with no significant medication resistant mutations. No history of opportunistic infection. [id number] negative. Previous ART experience with Atripla, Odefsey , and currently Biktarvy .    Subjective:   Chief Complaint  Patient presents with   Follow-up    HPI:  Ryan Cruz is a 58 y.o. male with HIV disease last seen on 07/27/2023 with well-controlled virus and good adherence and tolerance to Biktarvy .  No lab work was drawn at that time.  Previous lab work on 04/26/2023 with viral load that was undetectable and CD4 count of 650.  Kidney function, liver function, electrolytes within normal ranges.  Also noted to have shoulder pain.  Here today for routine follow-up.  Ryan Cruz has been doing well since his last office visit and continues to take Biktarvy  as prescribed with no adverse side effects or problems obtaining medication from the pharmacy.  Covered by Medicaid.  Recently purchased a new car.  Housing, transportation, and access to food are stable.  Shoulder pain has gradually improved with increased exercise.  Mood has been stable and is no longer taking fluoxetine  but continues to take olanzapine .  No suicidal ideations or signs of psychosis.  Healthcare maintenance reviewed.  Condoms and site-specific STD testing offered.  Denies fevers, chills, night sweats, headaches, changes in vision, neck pain/stiffness, nausea, diarrhea, vomiting, lesions or rashes.  Lab Results  Component Value Date   CD4TCELL 32 04/26/2023   CD4TABS 566 12/15/2022   Lab Results  Component Value Date   HIV1RNAQUANT 68 (H) 04/26/2023     Allergies  Allergen Reactions   Shellfish Allergy Hives   Doxycycline  Rash      Outpatient Medications Prior  to Visit  Medication Sig Dispense Refill   bictegravir-emtricitabine -tenofovir  AF (BIKTARVY ) 50-200-25 MG TABS tablet Take 1 tablet by mouth daily. 30 tablet 5   hydrochlorothiazide  (HYDRODIURIL ) 12.5 MG tablet Take 1 tablet (12.5 mg total) by mouth daily. 90 tablet 1   hydrOXYzine  (ATARAX ) 25 MG tablet Take 1 tablet (25 mg total) by mouth 3 (three) times daily as needed for anxiety. 30 tablet 1   OLANZapine  (ZYPREXA ) 10 MG tablet Take 1 tablet (10 mg total) by mouth at bedtime. 30 tablet 1   FLUoxetine  (PROZAC ) 10 MG capsule Take 1 capsule (10 mg total) by mouth daily. (Patient not taking: Reported on 11/15/2023) 30 capsule 1   traZODone  (DESYREL ) 50 MG tablet Take 1 tablet (50 mg total) by mouth at bedtime as needed for sleep. (Patient not taking: Reported on 11/15/2023) 30 tablet 1   No facility-administered medications prior to visit.     Past Medical History:  Diagnosis Date   Anxiety    Asthma    Depression    HIV (human immunodeficiency virus infection) (HCC)    Hypertension    Hypoglycemia    sugars drop ion     Past Surgical History:  Procedure Laterality Date   BIOPSY  08/18/2019   Procedure: BIOPSY;  Surgeon: Charlanne Groom, MD;  Location: [facility] ENDOSCOPY;  Service: Endoscopy;;   COLONOSCOPY  02/12/2012   Procedure: COLONOSCOPY;  Surgeon: Bernarda Ned, MD;  Location: [facility] ENDOSCOPY;  Service: Endoscopy;  Laterality: N/A;   ESOPHAGOGASTRODUODENOSCOPY (EGD) WITH PROPOFOL  N/A 08/18/2019  Procedure: ESOPHAGOGASTRODUODENOSCOPY (EGD) WITH PROPOFOL ;  Surgeon: Charlanne Groom, MD;  Location: [facility] ENDOSCOPY;  Service: Endoscopy;  Laterality: N/A;   HERNIA REPAIR     umbilibcal        Review of Systems  Constitutional:  Negative for appetite change, chills, fatigue, fever and unexpected weight change.  Eyes:  Negative for visual disturbance.  Respiratory:  Negative for cough, chest tightness, shortness of breath and wheezing.   Cardiovascular:  Negative for chest pain and leg swelling.   Gastrointestinal:  Negative for abdominal pain, constipation, diarrhea, nausea and vomiting.  Genitourinary:  Negative for dysuria, flank pain, frequency, genital sores, hematuria and urgency.  Skin:  Negative for rash.  Allergic/Immunologic: Negative for immunocompromised state.  Neurological:  Negative for dizziness and headaches.     Objective:   BP 128/85   Pulse 62   Temp 98.1 F (36.7 C) (Oral)   Wt 161 lb (73 kg)   SpO2 100%   BMI 25.22 kg/m  Nursing note and vital signs reviewed.  Physical Exam Constitutional:      General: He is not in acute distress.    Appearance: He is well-developed.  Eyes:     Conjunctiva/sclera: Conjunctivae normal.  Cardiovascular:     Rate and Rhythm: Normal rate and regular rhythm.     Heart sounds: Normal heart sounds. No murmur heard.    No friction rub. No gallop.  Pulmonary:     Effort: Pulmonary effort is normal. No respiratory distress.     Breath sounds: Normal breath sounds. No wheezing or rales.  Chest:     Chest wall: No tenderness.  Abdominal:     General: Bowel sounds are normal.     Palpations: Abdomen is soft.     Tenderness: There is no abdominal tenderness.  Musculoskeletal:     Cervical back: Neck supple.  Lymphadenopathy:     Cervical: No cervical adenopathy.  Skin:    General: Skin is warm and dry.     Findings: No rash.  Neurological:     Mental Status: He is alert and oriented to person, place, and time.  Psychiatric:        Behavior: Behavior normal.        Thought Content: Thought content normal.        Judgment: Judgment normal.          07/27/2023   11:14 AM 12/15/2022    9:48 AM 06/29/2022   10:35 AM 09/15/2021    9:57 AM 04/09/2021    2:38 PM  Depression screen PHQ 2/9  Decreased Interest 1 0 0 0 0  Down, Depressed, Hopeless 1 1 0 0 0  PHQ - 2 Score 2 1 0 0 0  Altered sleeping 1      Tired, decreased energy 1      Change in appetite 2      Trouble concentrating 0      Moving slowly or  fidgety/restless 0      Suicidal thoughts 0      PHQ-9 Score 6      Difficult doing work/chores Somewhat difficult            07/27/2023   11:14 AM 07/31/2020   11:17 AM 09/05/2019   11:15 AM 05/26/2016    8:55 AM  GAD 7 : Generalized Anxiety Score  Nervous, Anxious, on Edge 1 2 2 3   Control/stop worrying 1 2 2 3   Worry too much - different things 1 2 2  3  Trouble relaxing 1 2 2 3   Restless 0 2 2 2   Easily annoyed or irritable 1 2 2 3   Afraid - awful might happen 0 1 2 1   Total GAD 7 Score 5 13 14 18   Anxiety Difficulty Somewhat difficult        The 10-year ASCVD risk score (Arnett DK, et al., 2019) is: 18%   Values used to calculate the score:     Age: 41 years     Clincally relevant sex: Male     Is Non-Hispanic African American: Yes     Diabetic: No     Tobacco smoker: Yes     Systolic Blood Pressure: 128 mmHg     Is BP treated: Yes     HDL Cholesterol: 58 mg/dL     Total Cholesterol: 193 mg/dL      Assessment & Plan:    Patient Active Problem List   Diagnosis Date Noted   At increased risk for cardiovascular disease 11/15/2023   Acute pain of right shoulder 07/27/2023   Major depressive disorder, recurrent episode, severe (HCC) 09/08/2022   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   Stimulant use disorder 09/05/2022   Delusional disorder, persecutory type, multiple episodes, currently in acute episode (HCC) 09/04/2022   Cannabis use disorder 09/04/2022   Episodic methamphetamine abuse (HCC) 09/04/2022   Screening for STDs (sexually transmitted diseases) 09/15/2021   Therapeutic drug monitoring 09/15/2021   Giardia 04/22/2020   Protein-calorie malnutrition, severe 04/22/2020   Diarrhea 04/21/2020   Acute diffuse otitis externa of left ear 03/15/2020   Numbness of feet 03/11/2020   Intractable vomiting with nausea    Nausea vomiting and diarrhea    AKI (acute kidney injury) 08/14/2019   Foot lesion 06/05/2019   Healthcare maintenance 04/26/2018    Hepatitis B immune 11/18/2016   Medically noncompliant    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    Other headache syndrome    Optic neuritis 04/16/2016   Chest pain 11/20/2015   Hypoglycemia 01/29/2014   Allergic sinusitis 06/29/2012   Pilonidal cyst 01/04/2012   Depression 06/25/2010   CONDYLOMA ACUMINATUM 01/07/2010   Essential hypertension 12/12/2009   Ventral hernia 03/28/2009   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  No adverse side effects or hypotensive readings.  Encouraged to monitor blood pressure at home and follow low-sodium diet.  Continue current dose of hydrochlorothiazide .      Relevant Medications   hydrochlorothiazide  (HYDRODIURIL ) 12.5 MG tablet     Other   HIV infection [facility])   Ryan Cruz continues to have well-controlled virus with good adherence and tolerance to Biktarvy .  Reviewed previous lab work and discussed plan of care and U equals U.  No problems obtaining medication from the pharmacy and covered by Medicaid.  Social determinants of health reviewed with no interventions indicated.  Check blood work.  Continue current dose of Biktarvy .  Plan for follow-up in 6 months or sooner if needed with lab work on the same day.      Relevant Medications   bictegravir-emtricitabine -tenofovir  AF (BIKTARVY ) 50-200-25 MG TABS  tablet   Other Relevant Orders   Comprehensive metabolic panel with GFR   HIV-1 RNA quant-no reflex-bld   T-helper cell (CD4)- ([facility] clinic only)   Depression   Previously care and psychiatry and on fluoxetine  and olanzapine   with stable mood.  Has since discontinued taking fluoxetine  and continues to take olanzapine .  No suicidal ideations or signs of psychosis.  Mood has overall been stable.  Discussed potentially discontinuing olanzapine  at next office visit.  Continue hydroxyzine  as needed for anxiety.      Relevant Medications   hydrOXYzine  (ATARAX ) 25 MG tablet   Healthcare maintenance   Discussed importance of safe sexual practice and condom use. Condoms and site specific STD testing offered.  Influenza previously updated.  Other vaccines were deferred following counseling Due for colonoscopy and encouraged to contact gastroenterology      Screening for STDs (sexually transmitted diseases)   Relevant Orders   RPR   At increased risk for cardiovascular disease   Ryan Cruz is at increased risk for cardiovascular disease with 10-year ASCVD risk score of approximately 18%.  He may benefit from starting statin medication to reduce risk of cardiovascular disease and HIV associated inflammation.  Will consider at next office visit.      Other Visit Diagnoses       Pharmacologic therapy    -  Primary   Relevant Orders   Lipid panel        I have discontinued Ryan Cruz's FLUoxetine  and traZODone . I am also having him maintain his bictegravir-emtricitabine -tenofovir  AF, hydrochlorothiazide , hydrOXYzine , and OLANZapine .   Meds ordered this encounter  Medications   bictegravir-emtricitabine -tenofovir  AF (BIKTARVY ) 50-200-25 MG TABS tablet    Sig: Take 1 tablet by mouth daily.    Dispense:  30 tablet    Refill:  5    Supervising Provider:   SNIDER, CYNTHIA [id number]    Prescription Type::   Renewal   hydrochlorothiazide  (HYDRODIURIL ) 12.5 MG tablet    Sig: Take 1 tablet (12.5 mg total) by mouth daily.    Dispense:  90 tablet    Refill:  1    Supervising Provider:   SNIDER, CYNTHIA [[id number]]   hydrOXYzine  (ATARAX ) 25 MG tablet    Sig: Take 1 tablet (25 mg total) by mouth 3 (three) times daily as  needed for anxiety.    Dispense:  60 tablet    Refill:  1    Supervising Provider:   SNIDER, CYNTHIA [[id number]]   OLANZapine  (ZYPREXA ) 10 MG tablet    Sig: Take 1 tablet (10 mg total) by mouth at bedtime.    Dispense:  30 tablet    Refill:  5    Supervising Provider:   LUIZ CHANNEL [[id number]]     Follow-up: Return in about 6 months (around 05/15/2024). or sooner if needed.    Cathlyn July, MSN, FNP-C Nurse Practitioner [facility] Main number: [phone]

## 2023-11-15 NOTE — Assessment & Plan Note (Signed)
 Ryan Cruz continues to have well-controlled virus with good adherence and tolerance to Biktarvy .  Reviewed previous lab work and discussed plan of care and U equals U.  No problems obtaining medication from the pharmacy and covered by Medicaid.  Social determinants of health reviewed with no interventions indicated.  Check blood work.  Continue current dose of Biktarvy .  Plan for follow-up in 6 months or sooner if needed with lab work on the same day.

## 2023-11-16 LAB — T-HELPER CELL (CD4) - (RCID CLINIC ONLY)
CD4 % Helper T Cell: 34 % (ref 33–65)
CD4 T Cell Abs: 487 /uL (ref 400–1790)

## 2023-11-17 LAB — COMPREHENSIVE METABOLIC PANEL WITH GFR
AG Ratio: 1.6 (calc) (ref 1.0–2.5)
ALT: 22 U/L (ref 9–46)
AST: 18 U/L (ref 10–35)
Albumin: 4.9 g/dL (ref 3.6–5.1)
Alkaline phosphatase (APISO): 70 U/L (ref 35–144)
BUN: 13 mg/dL (ref 7–25)
CO2: 26 mmol/L (ref 20–32)
Calcium: 9.6 mg/dL (ref 8.6–10.3)
Chloride: 105 mmol/L (ref 98–110)
Creat: 1.3 mg/dL (ref 0.70–1.30)
Globulin: 3 g/dL (ref 1.9–3.7)
Glucose, Bld: 90 mg/dL (ref 65–99)
Potassium: 3.8 mmol/L (ref 3.5–5.3)
Sodium: 139 mmol/L (ref 135–146)
Total Bilirubin: 0.5 mg/dL (ref 0.2–1.2)
Total Protein: 7.9 g/dL (ref 6.1–8.1)
eGFR: 64 mL/min/1.73m2 (ref >=60)

## 2023-11-17 LAB — LIPID PANEL
Cholesterol: 232 mg/dL — ABNORMAL HIGH (ref <200)
HDL: 37 mg/dL — ABNORMAL LOW (ref >=40)
LDL Cholesterol (Calc): 146 mg/dL — ABNORMAL HIGH
Non-HDL Cholesterol (Calc): 195 mg/dL — ABNORMAL HIGH (ref <130)
Total CHOL/HDL Ratio: 6.3 (calc) — ABNORMAL HIGH (ref <5.0)
Triglycerides: 317 mg/dL — ABNORMAL HIGH (ref <150)

## 2023-11-17 LAB — HIV-1 RNA QUANT-NO REFLEX-BLD
HIV 1 RNA Quant: NOT DETECTED {copies}/mL
HIV-1 RNA Quant, Log: NOT DETECTED {Log_copies}/mL

## 2023-11-17 LAB — RPR: RPR Ser Ql: NONREACTIVE

## 2023-11-18 ENCOUNTER — Ambulatory Visit: Payer: Self-pay | Admitting: Family

## 2023-11-18 ENCOUNTER — Ambulatory Visit: Admitting: Family
